# Patient Record
Sex: Male | Born: 1965 | Race: Black or African American | Hispanic: No | Marital: Single | Smoking: Never smoker
Health system: Southern US, Community
[De-identification: ages and names within clinical notes are randomized; demographics above are authoritative.]

## PROBLEM LIST (undated history)

## (undated) DIAGNOSIS — R809 Proteinuria, unspecified: Secondary | ICD-10-CM

## (undated) DIAGNOSIS — D509 Iron deficiency anemia, unspecified: Secondary | ICD-10-CM

## (undated) DIAGNOSIS — I5042 Chronic combined systolic (congestive) and diastolic (congestive) heart failure: Secondary | ICD-10-CM

## (undated) DIAGNOSIS — N189 Chronic kidney disease, unspecified: Secondary | ICD-10-CM

## (undated) DIAGNOSIS — Z9114 Patient's other noncompliance with medication regimen: Secondary | ICD-10-CM

## (undated) DIAGNOSIS — Z9289 Personal history of other medical treatment: Secondary | ICD-10-CM

## (undated) DIAGNOSIS — I10 Essential (primary) hypertension: Secondary | ICD-10-CM

## (undated) HISTORY — PX: OTHER SURGICAL HISTORY: SHX169

---

## 2004-11-07 ENCOUNTER — Emergency Department (HOSPITAL_COMMUNITY): Admission: EM | Admit: 2004-11-07 | Discharge: 2004-11-07 | Payer: Self-pay | Admitting: Emergency Medicine

## 2010-05-31 ENCOUNTER — Inpatient Hospital Stay (HOSPITAL_COMMUNITY)
Admission: EM | Admit: 2010-05-31 | Discharge: 2010-06-05 | DRG: 682 | Disposition: A | Discharge status: Home / Self Care | Payer: Medicaid Other | Attending: Internal Medicine | Admitting: Internal Medicine

## 2010-05-31 ENCOUNTER — Emergency Department (HOSPITAL_COMMUNITY): Payer: Medicaid Other

## 2010-05-31 DIAGNOSIS — Z91199 Patient's noncompliance with other medical treatment and regimen due to unspecified reason: Secondary | ICD-10-CM

## 2010-05-31 DIAGNOSIS — I472 Ventricular tachycardia, unspecified: Secondary | ICD-10-CM | POA: Diagnosis not present

## 2010-05-31 DIAGNOSIS — I5021 Acute systolic (congestive) heart failure: Secondary | ICD-10-CM | POA: Diagnosis present

## 2010-05-31 DIAGNOSIS — N032 Chronic nephritic syndrome with diffuse membranous glomerulonephritis: Secondary | ICD-10-CM | POA: Diagnosis present

## 2010-05-31 DIAGNOSIS — E876 Hypokalemia: Secondary | ICD-10-CM | POA: Diagnosis present

## 2010-05-31 DIAGNOSIS — I4729 Other ventricular tachycardia: Secondary | ICD-10-CM | POA: Diagnosis not present

## 2010-05-31 DIAGNOSIS — I129 Hypertensive chronic kidney disease with stage 1 through stage 4 chronic kidney disease, or unspecified chronic kidney disease: Principal | ICD-10-CM | POA: Diagnosis present

## 2010-05-31 DIAGNOSIS — N179 Acute kidney failure, unspecified: Secondary | ICD-10-CM | POA: Diagnosis present

## 2010-05-31 DIAGNOSIS — Z9119 Patient's noncompliance with other medical treatment and regimen: Secondary | ICD-10-CM

## 2010-05-31 DIAGNOSIS — N184 Chronic kidney disease, stage 4 (severe): Secondary | ICD-10-CM | POA: Diagnosis present

## 2010-05-31 DIAGNOSIS — I509 Heart failure, unspecified: Secondary | ICD-10-CM | POA: Diagnosis present

## 2010-05-31 LAB — COMPREHENSIVE METABOLIC PANEL
Alkaline Phosphatase: 72 U/L (ref 39–117)
BUN: 29 mg/dL — ABNORMAL HIGH (ref 6–23)
Calcium: 8.5 mg/dL (ref 8.4–10.5)
Creatinine, Ser: 2.83 mg/dL — ABNORMAL HIGH (ref 0.4–1.5)
Glucose, Bld: 79 mg/dL (ref 70–99)
Total Protein: 5.4 g/dL — ABNORMAL LOW (ref 6.0–8.3)

## 2010-05-31 LAB — URINE MICROSCOPIC-ADD ON

## 2010-05-31 LAB — CBC
HCT: 41.3 % (ref 39.0–52.0)
MCH: 26.4 pg (ref 26.0–34.0)
MCV: 84.6 fL (ref 78.0–100.0)
RBC: 4.88 MIL/uL (ref 4.22–5.81)
RDW: 15.5 % (ref 11.5–15.5)
WBC: 10.5 10*3/uL (ref 4.0–10.5)

## 2010-05-31 LAB — DIFFERENTIAL
Eosinophils Relative: 2 % (ref 0–5)
Lymphocytes Relative: 20 % (ref 12–46)
Lymphs Abs: 2.1 10*3/uL (ref 0.7–4.0)
Monocytes Relative: 5 % (ref 3–12)

## 2010-05-31 LAB — POCT CARDIAC MARKERS: Troponin i, poc: <0.05 ng/mL (ref 0.00–0.09)

## 2010-05-31 LAB — POCT I-STAT, CHEM 8
Calcium, Ion: 1.05 mmol/L — ABNORMAL LOW (ref 1.12–1.32)
Chloride: 111 mEq/L (ref 96–112)
Glucose, Bld: 106 mg/dL — ABNORMAL HIGH (ref 70–99)
HCT: 40 % (ref 39.0–52.0)
TCO2: 21 mmol/L (ref 0–100)

## 2010-05-31 LAB — URINALYSIS, ROUTINE W REFLEX MICROSCOPIC
Bilirubin Urine: NEGATIVE
Specific Gravity, Urine: 1.034 — ABNORMAL HIGH (ref 1.005–1.030)
Urine Glucose, Fasting: NEGATIVE mg/dL

## 2010-05-31 IMAGING — CR DG CHEST 1V PORT
1 series · 2 of 2 positions shown · non-contrast
Comparison: None.

CLINICAL DATA: Bilateral leg swelling

PORTABLE CHEST - 1 VIEW

[Series 1: AP · U · 2 of 2 slices shown]
[im 1/2]
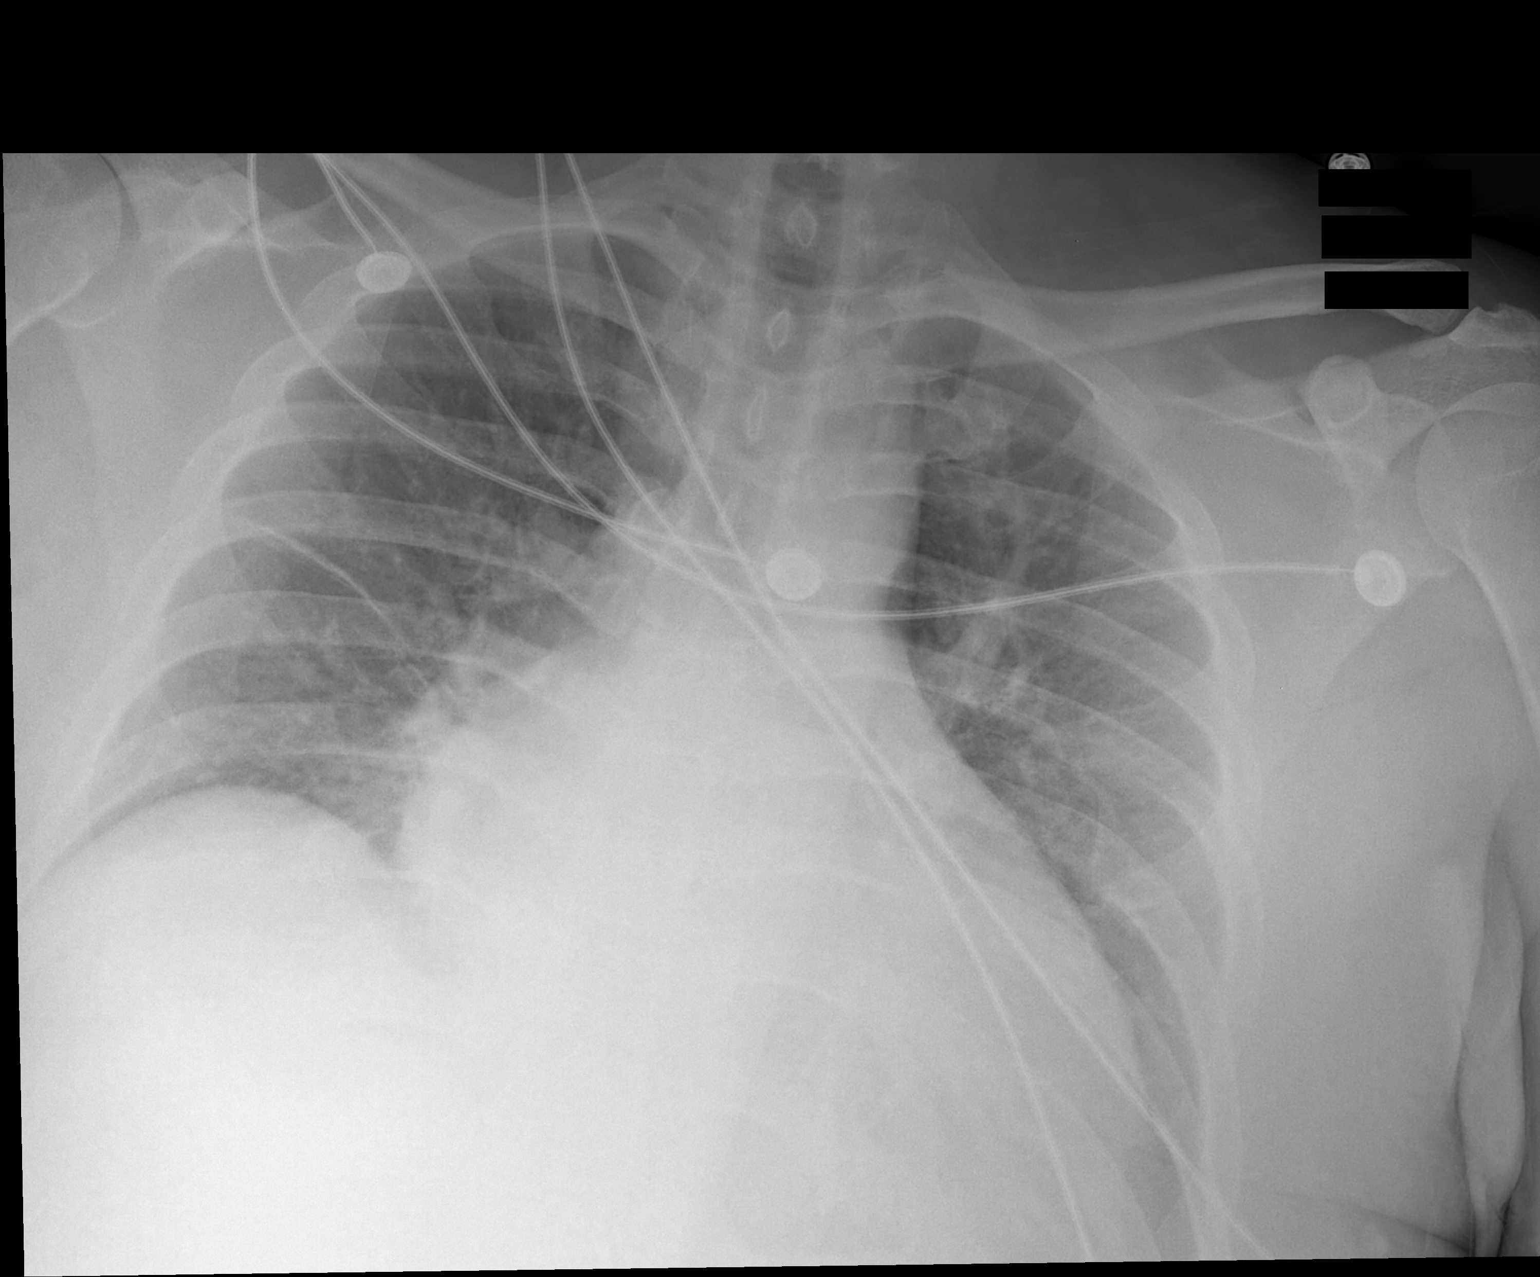
[im 2/2]
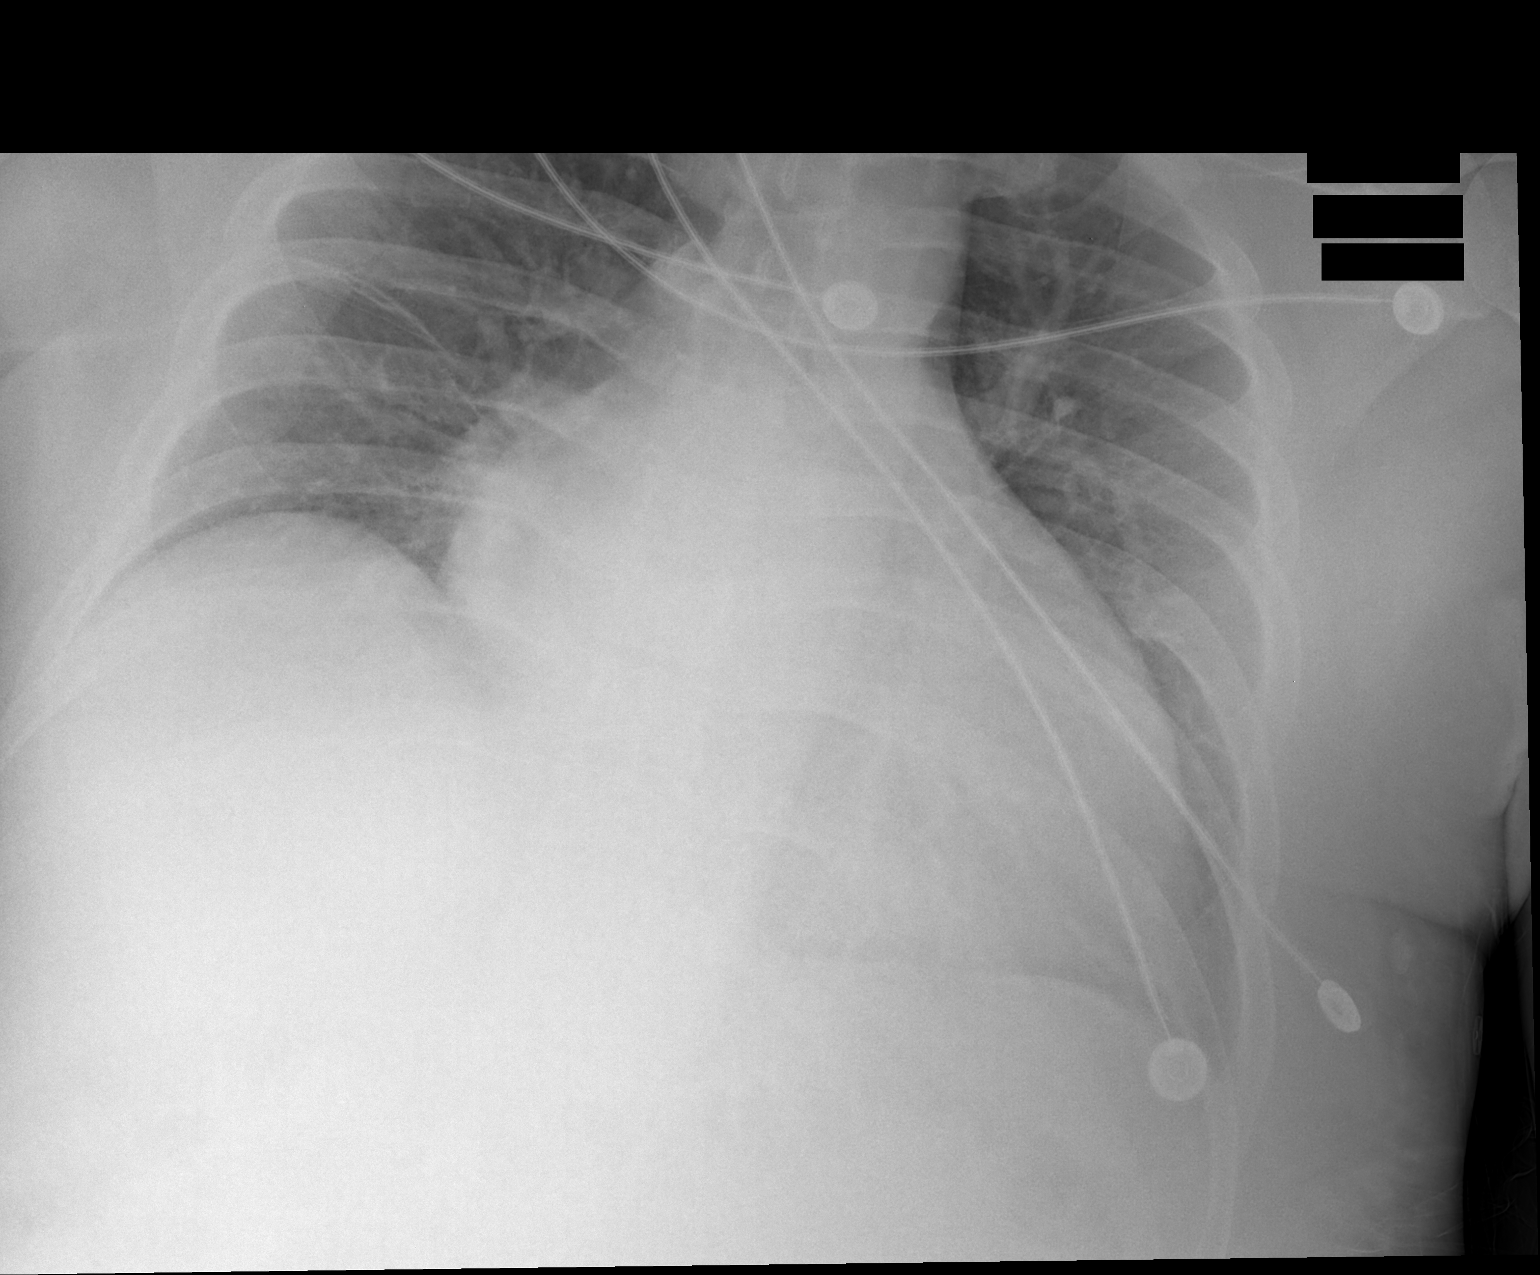

[2 of 2 positions shown; findings below may reference images not displayed]

FINDINGS: Mild cardiomegaly.  Central basilar airspace disease and
vascular congestion.  No pneumothorax.  Low volumes.
IMPRESSION: Cardiomegaly and mild CHF.

## 2010-05-31 IMAGING — US US RENAL
1 series · 14 of 25 positions shown · non-contrast
Comparison: None.

CLINICAL DATA: Hypertension

RENAL/URINARY TRACT ULTRASOUND COMPLETE

[Series 1: us renal · 0.33mm/px · 14 of 37 slices shown]
[im 1/37]
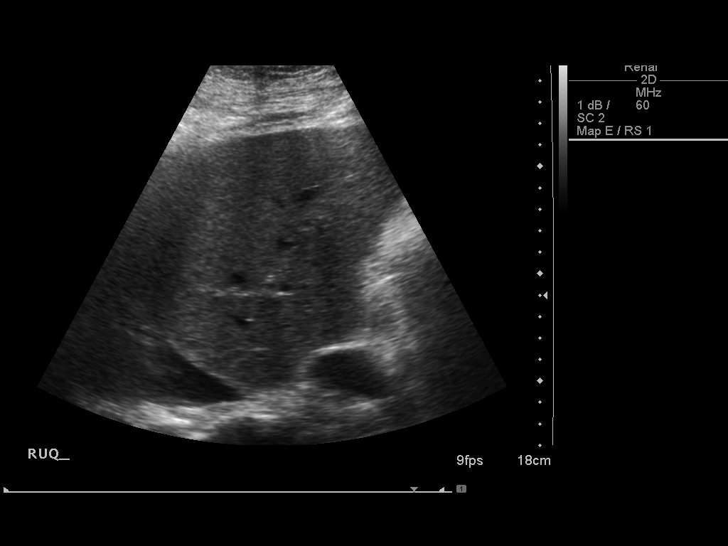
[im 4/37]
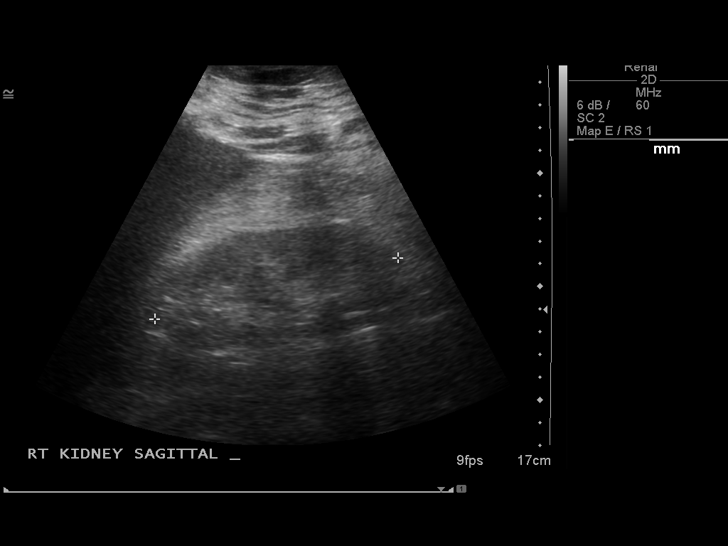
[im 7/37]
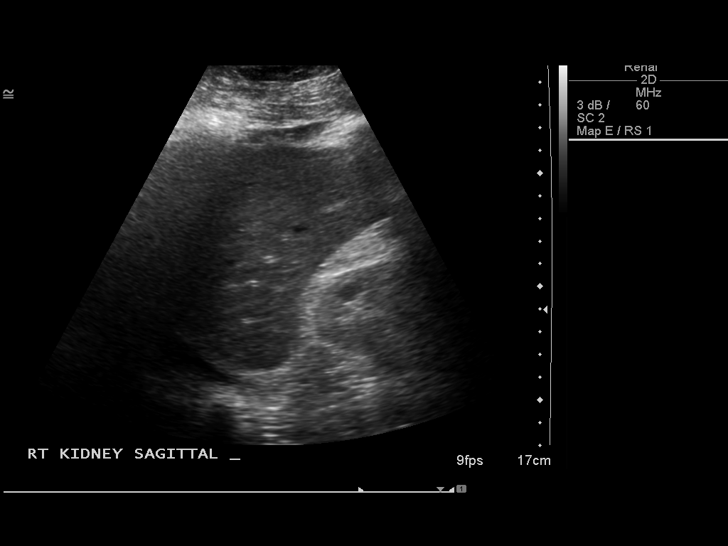
[im 10/37]
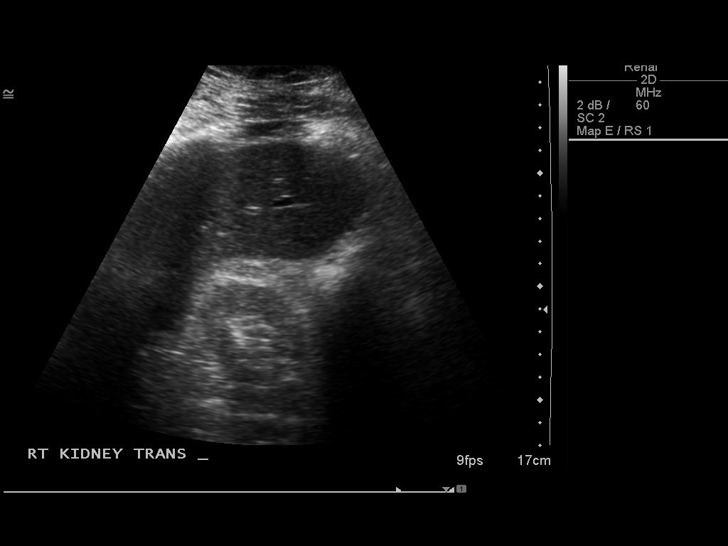
[im 13/37]
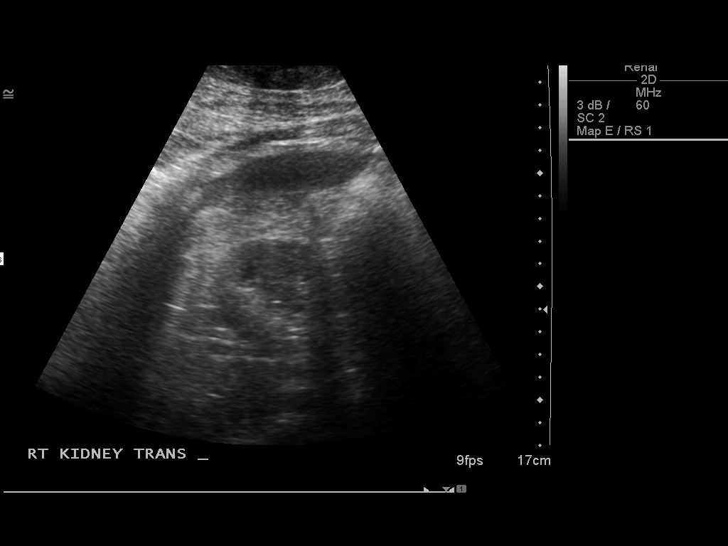
[im 14/37]
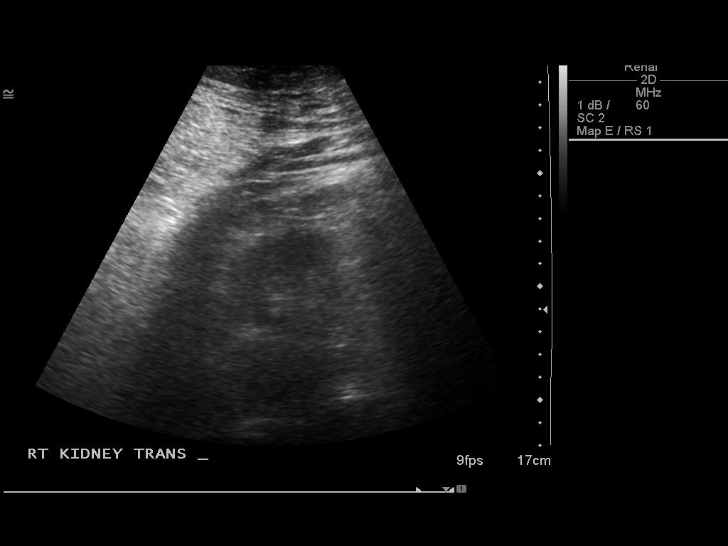
[im 17/37]
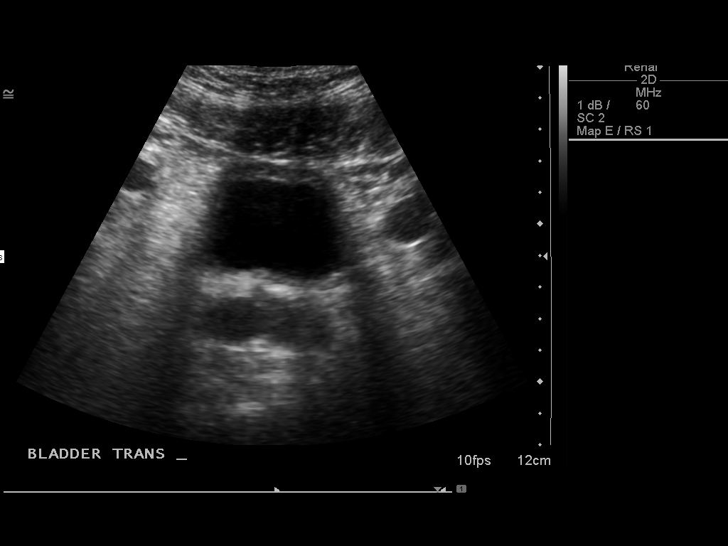
[im 20/37]
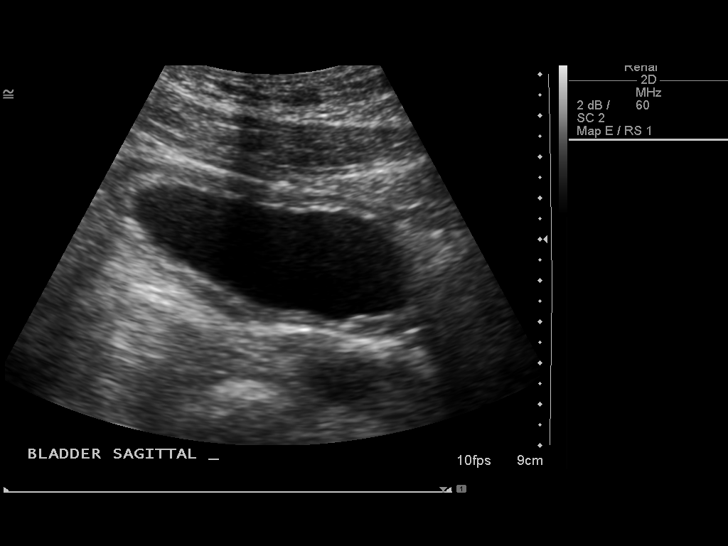
[im 23/37]
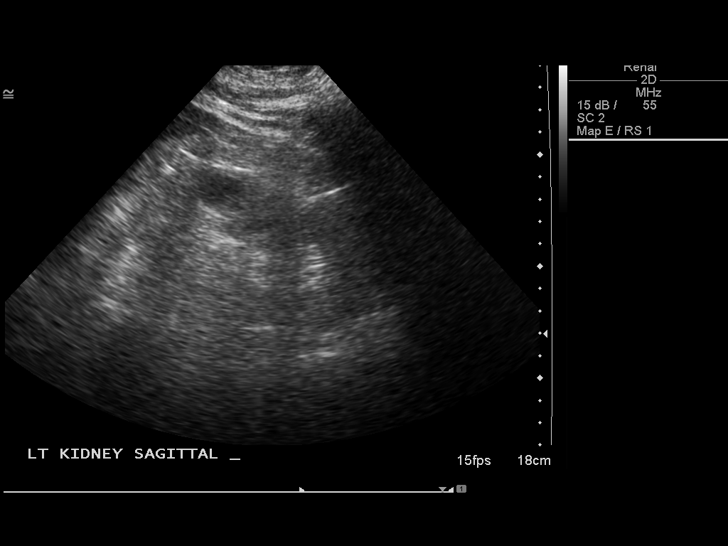
[im 25/37]
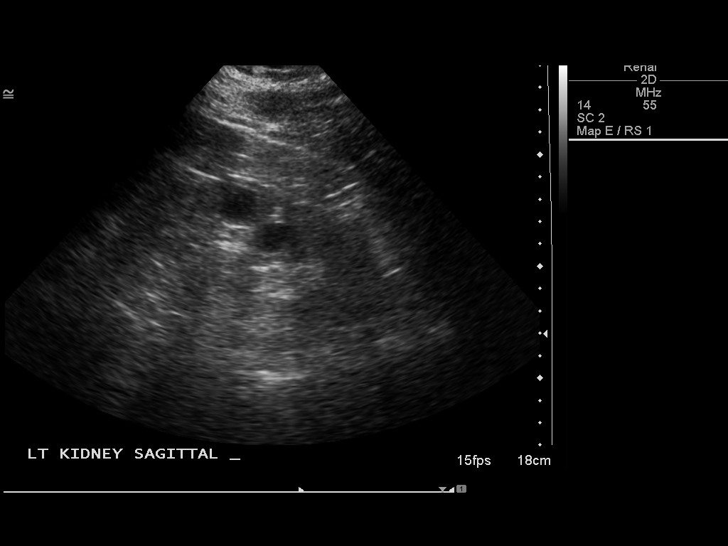
[im 28/37]
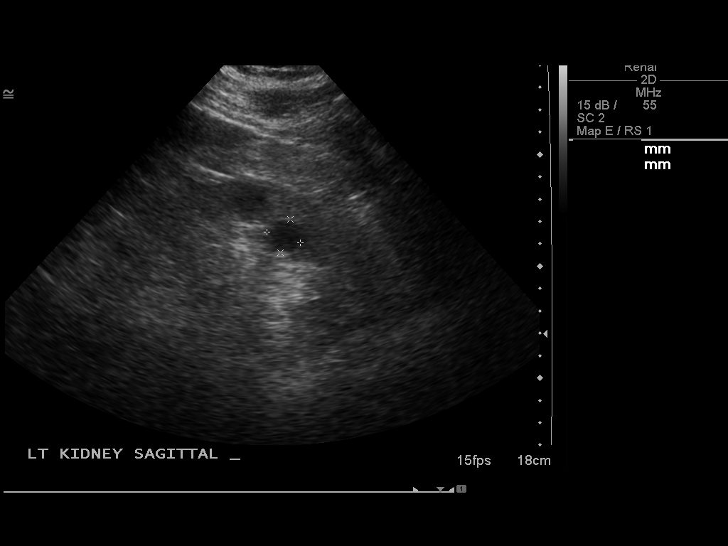
[im 31/37]
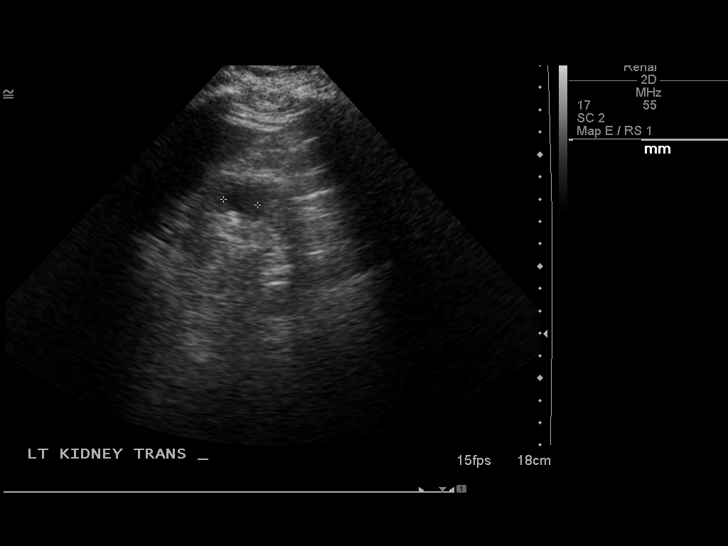
[im 34/37]
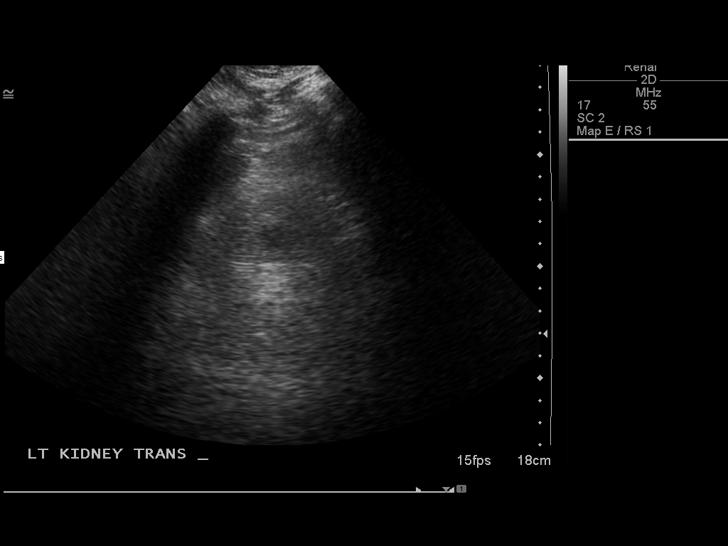
[im 37/37]
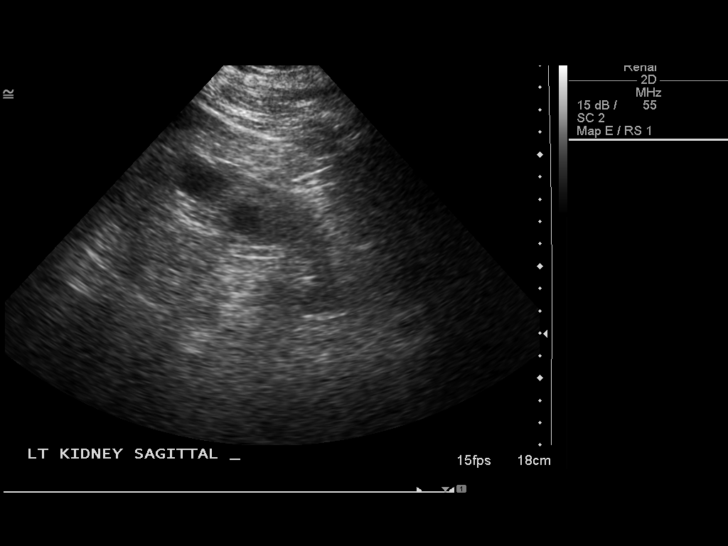

[14 of 25 positions shown; findings below may reference images not displayed]

FINDINGS: Right pleural effusion.

Right Kidney:  11.1 cm in length.  0.8 x 1.0 x 0.8 cm cyst in the
upper pole.  It is homogeneous with some internal echoes.  No solid
elements.  No hydronephrosis.

Left Kidney:  12.1 cm in length.  There are too small cyst in the
upper pole.  One is 1.8 x 1.1 0.6 cm.  The other is 1.6 x 1.6 x
cm.  They are homogeneous with internal echoes.  No hydronephrosis.

Bladder:  Within normal limits.
IMPRESSION: No hydronephrosis.

Benign-appearing cyst in the kidneys.

Right pleural effusion.

## 2010-06-01 LAB — MAGNESIUM: Magnesium: 2 mg/dL (ref 1.5–2.5)

## 2010-06-01 LAB — LIPID PANEL
LDL Cholesterol: 153 mg/dL — ABNORMAL HIGH (ref 0–99)
VLDL: 22 mg/dL (ref 0–40)

## 2010-06-01 LAB — BASIC METABOLIC PANEL
Chloride: 109 mEq/L (ref 96–112)
Creatinine, Ser: 2.83 mg/dL — ABNORMAL HIGH (ref 0.4–1.5)
GFR calc Af Amer: 30 mL/min — ABNORMAL LOW (ref >60)
Potassium: 3.1 mEq/L — ABNORMAL LOW (ref 3.5–5.1)
Sodium: 141 mEq/L (ref 135–145)

## 2010-06-01 LAB — TSH: TSH: 1.379 u[IU]/mL (ref 0.350–4.500)

## 2010-06-01 LAB — SODIUM, URINE, RANDOM: Sodium, Ur: 70 mEq/L

## 2010-06-01 LAB — CK: Total CK: 78 U/L (ref 7–232)

## 2010-06-01 LAB — MRSA PCR SCREENING: MRSA by PCR: NEGATIVE

## 2010-06-02 DIAGNOSIS — I5021 Acute systolic (congestive) heart failure: Secondary | ICD-10-CM

## 2010-06-02 LAB — BASIC METABOLIC PANEL
CO2: 26 mEq/L (ref 19–32)
Calcium: 8.8 mg/dL (ref 8.4–10.5)
GFR calc Af Amer: 29 mL/min — ABNORMAL LOW (ref >60)
GFR calc non Af Amer: 24 mL/min — ABNORMAL LOW (ref >60)
Glucose, Bld: 101 mg/dL — ABNORMAL HIGH (ref 70–99)
Potassium: 3.5 mEq/L (ref 3.5–5.1)
Sodium: 142 mEq/L (ref 135–145)

## 2010-06-02 LAB — CBC
Hemoglobin: 11.6 g/dL — ABNORMAL LOW (ref 13.0–17.0)
MCH: 26.1 pg (ref 26.0–34.0)
MCV: 83.8 fL (ref 78.0–100.0)
RBC: 4.44 MIL/uL (ref 4.22–5.81)
WBC: 7.9 10*3/uL (ref 4.0–10.5)

## 2010-06-02 LAB — CORTISOL: Cortisol, Plasma: 9.1 ug/dL

## 2010-06-02 LAB — C3 COMPLEMENT: C3 Complement: 127 mg/dL (ref 88–201)

## 2010-06-02 NOTE — H&P (Signed)
NAME:  Timothy Good, Timothy Good NO.:  [id number]  MEDICAL RECORD NO.:  [id number]           PATIENT TYPE:  E  LOCATION:  WLED                         FACILITY:  [facility]  PHYSICIAN:  Karlyn Agee, M.D. DATE OF BIRTH:  1965/06/29  DATE OF ADMISSION:  05/31/2010 DATE OF DISCHARGE:                             HISTORY & PHYSICAL   PRIMARY CARE PHYSICIAN:  [facility]/unassigned.  CHIEF COMPLAINT:  Swelling of the legs, progressively worse over the past 6 days.  HISTORY OF PRESENT ILLNESS:  This is a 45 year old African-American gentleman who has a history of hypertension, but has been off his medications for the past 2 years.  He has been having no complaints.  No headaches, chest pain, or shortness of breath and he is currently relocated to live in [place] with his daughter and she insisted that he come into the emergency room to be evaluated.  The patient came to be evaluated.  His blood pressure was found to be markedly elevated up to 205/155.  His renal function was found to be abnormal and the hospitalist service was called to assist with management.  The patient denies any regular use of aspirin or NSAIDs.  He said he did use BC powders 3 weeks ago and he did take to over-the-counter Advil about 2 days ago.  He denies any passage of bloody or black stool.  He denies any nausea, vomiting, or diarrhea.  The patient has received 80 mg of Lasix and so far put out 1200 mL of clear urine.  He denies any shortness of breath.  PAST MEDICAL HISTORY:  Hypertension.  MEDICATIONS:  None for the past 2 years.  Previously used to take atenolol and chlorthalidone.  ALLERGIES:  TO PENICILLIN.  SOCIAL HISTORY:  Denies tobacco or illicit drug use.  He drinks about 2 to 3 whiskeys per day.  Denies any tremors or withdrawal symptoms if he does not drink.  He is an unemployed Radiation protection practitioner.  FAMILY HISTORY:  Denies any medical problems in his  immediate family including hypertension, heart disease, or kidney disease.  REVIEW OF SYSTEMS:  Other than noted above unremarkable.  PHYSICAL EXAM:  GENERAL:  Pleasant, slightly obese middle-aged African- American gentleman, reclining in the stretcher.  Seems very anxious. VITALS:  His temperature is 97.6.  His pulse is currently 117, It was 123, respiratory rate is 16.  His blood pressure is now down to 197/148. He is saturating at 98% on 2 L. HEENT:  His pupils are round and equal.  Mucous membranes pink. Anicteric. NECK:  No cervical lymphadenopathy or thyromegaly.  No carotid bruit. CHEST:  Has coarse crackles over the right base and dullness to percussion on the right bases. CARDIOVASCULAR SYSTEM:  Regular rhythm.  No murmur heard. ABDOMEN:  Obese, soft, and nontender.  No masses felt.  Has normal abdominal bowel sounds. EXTREMITIES:  He had a 2+ soft pitting edema.  Feet are warm.  He has no arthritic deformities. SKIN:  Warm and dry.  There is no ulceration. CENTRAL NERVOUS SYSTEM:  He is somewhat jittery, but cranial nerves II through XII appear  to be grossly intact and he has no focal lateralizing signs.  LABORATORY DATA:  His white count is 10.5, hemoglobin 12.9, platelets 281 with a normal differential.  His I-STAT chemistry shows a sodium of 138, potassium 5.8, chloride 111, CO2 of 21.  His glucose is 106, BUN 46, creatinine 3.1.  His beta-type natriuretic peptide is elevated to 2090.  Urinalysis shows urine specific gravity of 1.034, large amount of blood, greater than 300 protein, nitrites, and leukocyte esterase negative.  Urine microscopy shows no white cells, no red cells, granular casts, but otherwise unremarkable.  His EKG shows sinus tachycardia with nonspecific ST-wave changes.  Chest x-ray has not been done.  ASSESSMENT: 1. Hypertensive emergency. 2. 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 3. 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 4. Proteinuria.  PLAN: 1. We will continue the patient on a nitroglycerin drip.  We will add     regular hydralazine as well as p.r.n. hydralazine to assist with     blood pressure management. 2. We will continue twice daily Lasix to assist with diuresis, but we     will monitor his renal function. 3. We will get a 2-D echo for CHF and we will get a renal ultrasound     to evaluate just how chronic his kidney disease is.  We will check     a serum potassium, urine electrolytes, and get 24-hour urine for     proteins.  His urinalysis is positive for heme; however, he is     showing no blood cells on microscopy and no CK-MB in his urine.  We     will get a total CPK to just double-check and we will consult     nephrologist for assistance with management during his hospital     course.  Other plans as per orders.  Critical care time managing this patient is 60 minutes.     Karlyn Agee, M.D.     LC/MEDQ  D:  05/31/2010  T:  05/31/2010  Job:  [id number]  Electronically Signed by Karlyn Agee M.D. on 06/02/2010 03:33:20 AM

## 2010-06-03 ENCOUNTER — Inpatient Hospital Stay (HOSPITAL_COMMUNITY): Payer: Medicaid Other

## 2010-06-03 DIAGNOSIS — I10 Essential (primary) hypertension: Secondary | ICD-10-CM

## 2010-06-03 DIAGNOSIS — I428 Other cardiomyopathies: Secondary | ICD-10-CM

## 2010-06-03 LAB — BASIC METABOLIC PANEL
BUN: 22 mg/dL (ref 6–23)
CO2: 28 mEq/L (ref 19–32)
Calcium: 8.8 mg/dL (ref 8.4–10.5)
Chloride: 105 mEq/L (ref 96–112)
Creatinine, Ser: 4.54 mg/dL — ABNORMAL HIGH (ref 0.4–1.5)
GFR calc Af Amer: 17 mL/min — ABNORMAL LOW (ref >60)

## 2010-06-03 LAB — BRAIN NATRIURETIC PEPTIDE: Pro B Natriuretic peptide (BNP): 952 pg/mL — ABNORMAL HIGH (ref 0.0–100.0)

## 2010-06-03 MED ORDER — TECHNETIUM TC 99M TETROFOSMIN IV KIT
30.0000 | PACK | Freq: Once | INTRAVENOUS | Status: AC | PRN
  Administered 2010-06-03: 30 via INTRAVENOUS

## 2010-06-03 MED ORDER — TECHNETIUM TC 99M TETROFOSMIN IV KIT
10.0000 | PACK | Freq: Once | INTRAVENOUS | Status: AC | PRN
  Administered 2010-06-03: 10 via INTRAVENOUS

## 2010-06-04 ENCOUNTER — Inpatient Hospital Stay (HOSPITAL_COMMUNITY): Payer: Self-pay

## 2010-06-04 DIAGNOSIS — I5020 Unspecified systolic (congestive) heart failure: Secondary | ICD-10-CM

## 2010-06-04 LAB — BASIC METABOLIC PANEL
BUN: 24 mg/dL — ABNORMAL HIGH (ref 6–23)
Calcium: 8.8 mg/dL (ref 8.4–10.5)
Chloride: 101 mEq/L (ref 96–112)
Creatinine, Ser: 2.57 mg/dL — ABNORMAL HIGH (ref 0.4–1.5)
GFR calc Af Amer: 33 mL/min — ABNORMAL LOW (ref >60)
GFR calc non Af Amer: 28 mL/min — ABNORMAL LOW (ref >60)

## 2010-06-04 LAB — URINE CULTURE
Colony Count: 25000
Culture  Setup Time: 201203021439

## 2010-06-04 LAB — PHOSPHORUS: Phosphorus: 4.2 mg/dL (ref 2.3–4.6)

## 2010-06-04 LAB — IRON AND TIBC: Saturation Ratios: 16 % — ABNORMAL LOW (ref 20–55)

## 2010-06-04 LAB — MAGNESIUM: Magnesium: 2 mg/dL (ref 1.5–2.5)

## 2010-06-04 LAB — FOLATE: Folate: 11.2 ng/mL

## 2010-06-04 LAB — FERRITIN: Ferritin: 93 ng/mL (ref 22–322)

## 2010-06-05 LAB — BASIC METABOLIC PANEL
BUN: 25 mg/dL — ABNORMAL HIGH (ref 6–23)
Chloride: 102 mEq/L (ref 96–112)
GFR calc Af Amer: 32 mL/min — ABNORMAL LOW (ref >60)
GFR calc non Af Amer: 26 mL/min — ABNORMAL LOW (ref >60)
Potassium: 3.6 mEq/L (ref 3.5–5.1)
Sodium: 137 mEq/L (ref 135–145)

## 2010-06-05 LAB — PROTEIN ELECTROPH W RFLX QUANT IMMUNOGLOBULINS
Albumin ELP: 50.2 % — ABNORMAL LOW (ref 55.8–66.1)
Beta Globulin: 5.9 % (ref 4.7–7.2)
M-Spike, %: NOT DETECTED g/dL
Total Protein ELP: 5.5 g/dL — ABNORMAL LOW (ref 6.0–8.3)

## 2010-06-05 LAB — PTH, INTACT AND CALCIUM
Calcium, Total (PTH): 8.6 mg/dL (ref 8.4–10.5)
PTH: 249.7 pg/mL — ABNORMAL HIGH (ref 14.0–72.0)

## 2010-06-05 LAB — PROTEIN, URINE, 24 HOUR
Collection Interval-UPROT: 24 hours
Urine Total Volume-UPROT: 4550 mL

## 2010-06-05 NOTE — Consult Note (Signed)
NAME:  Timothy Good, Timothy Good              ACCOUNT NO.:  [id number]  MEDICAL RECORD NO.:  [id number]           PATIENT TYPE:  I  LOCATION:  [id number]                         [facility]ACILITY:  [facility]  PHYSICIAN:  Tywan Siever L. Undra Trembath, M.D.DATE OF BIRTH:  05/13/1965  DATE OF CONSULTATION: DATE OF DISCHARGE:                                CONSULTATION   REFERRING PHYSICIAN:  [facility].  REASON FOR CONSULTATION:  Chronic kidney disease stage 4, severe hypertension.  HISTORY OF PRESENT ILLNESS:  This is a 45 year old gentleman with over 20 years of hypertension that is known.  He did not take any medications for a couple of years.  No family history of renal disease.  His daughter brought him to emergency room at this time to have him evaluated.  He notes edema has been the only symptom he has had.  He gets short of breath walking a flight of stairs or walking uphill.  He does try to exercise.  He has been losing a little weight.  He has nocturia x2 to 3.  Has family history of hypertension in his mother, he has a son who does not have high blood pressure, and dad he does not know if he has high blood pressure.  He has no history of stones, UTIs. No family history of inherited eye, hearing, or musculoskeletal defects. He has had no history of rash, arthritis.  He occasionally has some arthralgias once in a while and takes no more than 1 week of Tylenol or Advil.  He denies street drug use.  He denies smoking.  He occasionally uses alcohol once every 2-3 weeks, 3-4 drinks.  He is currently on no medications.  PAST MEDICAL HISTORY:  As above.  No operations.  ALLERGIES:  PENICILLIN.  REVIEW OF SYSTEMS:  HEENT:  No headaches, visual difficulties, sores, or rash.  PULMONARY:  He denies cough, sputum production.  He does have occasional hay fever.  GI:  No nausea, vomiting, diarrhea.  No indigestion or heartburn.  No history of hepatitis, yellow jaundice. SKIN:  Unremarkable.  MUSCULOSKELETAL:   No specific complaints. NEUROLOGIC:  Negative.  CARDIOVASCULAR:  As listed above.  No PND, no orthopnea.  FAMILY HISTORY:  Listed above.  He has a daughter and a son, who are healthy.  SOCIAL HISTORY:  He has worked as a Radiation protection practitioner, he is currently unemployed. Nonsmoker, nondrinker.  OBJECTIVE:  GENERAL:  No acute distress. HEENT:  Dense scarring in both retinas.  Pharynx, unremarkable. NECK:  Unremarkable. LUNGS:  Reveal no rales, rhonchi, or wheeze.  Slightly decreased resonance to percussion, normal expansion, normal breath sounds. CARDIOVASCULAR:  S4, grade 1/6 to 2/6 holosystolic murmur heard best at the apex.  Left ventricular lift, 2+ edema.  Pulses 2+ over 4+, no bruits. ABDOMEN:  Obese.  Positive bowel sounds.  Liver is down about 4 cm. SKIN:  No rash. MUSCULOSKELETAL:  Unremarkable. NEUROLOGICAL EXAM:  Cranial nerves II-XII grossly intact.  Motor 5/5, symmetric.  Deep tendon reflexes 2+ over 4+ in upper extremities, 1+ in patellar and Achilles.  Toes downgoing. VITAL SIGNS:  Blood pressure 134/82, temperature 98.1, 97.6% sat on room air.  Chest x-ray, cardiomegaly with vascular congestion.  Ultrasound of kidneys, right 11.1 with increased echogenicity, left 12.1 with increased echogenicity and right pleural effusion on ultrasound.  Urine output 100-200 cc an hour.  Hemoglobin is 12.9, white count of 11.5, platelets 21,000.  Sodium 149, potassium 3.1, chloride 108, bicarbonate 24, creatinine 2.83, BUN is 27, glucose 102, calcium 8.3, phosphorus 4.4, LDL was 153.  BNP is 2090.  Urinalysis, 300 mg of protein, 0-2 red cells, 0-2 white cells, urine sodium 70, urine creatinine 113.  HIV negative.  ASSESSMENT: 1. Chronic kidney disease 4 secondary to hypertension plus/minus a     chronic myelopathy.  Acute hypertension can give you proteinuria,     but the mild proteinuria is more typical for chronic myelopathy     such as focal segmental glomerulosclerosis.  Doubt there is  much     reversibility with the echogenicity that he has and the fact this     has been going on this long.  His creatinine may come down with     time, but usually that is delayed.  He needs some blood pressure     control.  This has to do with renal perfusion and inability to     autoregulate.  His volume is in excess now.  We will do an     evaluation to rule out cardiovascular disease, dysproteinemia.  He     is not a candidate for biopsy now, but may have to consider that in     the future. 2. Hypertension.  Would not bring down to goal at this point because     of higher risk of injury to the kidney further.  Decreased     potassium is secondary to hypertension.  Needs diuresis.  We will     add an ACE in 7-14 days once he settles down his proteinuria and I     will assist in his blood pressure control.  He needs diuresis at     this time. 3. Hyperlipidemia.  __________ and also because of proteinuria.  PLAN: 1. Serum urine protein electrophoresis. 2. __________ T4. 3. FNA. 4. Hepatitis B serology. 5. Hepatitis C testing. 6. Add Norvasc. 7. Lasix.          ______________________________ Joyice Faster Ioannis Schuh, M.D.  JLD/MEDQ  D:  06/01/2010  T:  06/01/2010  Job:  [id number]  Electronically Signed by Mauricia Area M.D. on 06/05/2010 04:41:11 PM

## 2010-06-15 LAB — CATECHOLAMINES, FRACTIONATED, URINE, 24 HOUR

## 2010-06-15 LAB — VMA AND HVA, 24 HR UR
24 Hour urine volume (VMAHVA): 4550 mL/24 h
Creatinine, Urine mg/day-VMAUR: 1.81 g/(24.h) (ref 0.63–2.50)
Homovanillic Acid, 24H Ur: 3.2 mg/24 h (ref 1.6–7.5)

## 2010-07-05 NOTE — Consult Note (Signed)
NAME:  Timothy Good, Timothy Good              ACCOUNT NO.:  [id number]  MEDICAL RECORD NO.:  [id number]           PATIENT TYPE:  I  LOCATION:  [id number]                         [facility]ACILITY:  [facility]  PHYSICIAN:  Marijo Conception. Fahmida Jurich, MD, FACCDATE OF BIRTH:  Nov 21, 1965  DATE OF CONSULTATION: DATE OF DISCHARGE:                                CONSULTATION   DATE OF CONSULTATION:  June 02, 2010.  PRIMARY CARE PHYSICIAN:  None.  PRIMARY CARDIOLOGIST:  None.  CHIEF COMPLAINT:  Acute systolic CHF.  HISTORY OF PRESENT ILLNESS:  Timothy Good is a 45 year old male with no previous history of coronary artery disease or cardiac issues.  He was admitted on February 29 with congestive heart failure.  An echocardiogram showed his EF to be 20 to 25%, so cardiology was asked to evaluate him.  When Timothy Good thinks about it, he realizes that he was having some dyspnea on exertion as long as 3 weeks ago.  However, he feels that his symptoms really began about a week ago, as that is when he started noticing lower extremity edema and increasing dyspnea on exertion.  By the night before he came in the hospital, he was getting dyspneic, walking room to room.  He also describes 1 episode of PND.  He does not believe he had orthopnea.  He does not exercise and is not active, so the progression of his dyspnea is difficult to ascertain.  Since being in the hospital and being diuresed, he feels much better.  He has never had chest pain.  PAST MEDICAL HISTORY: 1. Hypertension:  The patient is not sure how long he has been     hypertensive but thinks he was first told about it 4 or 5 years     ago.  He has never taken medications consistently for this and has     received very little care. 2. The patient denies any history of diabetes, hyperlipidemia or     tobacco use.  PAST SURGICAL HISTORY:  None.  ALLERGIES:  PENICILLIN.  MEDICATIONS PRIOR TO ADMISSION:  None in 2 years or more.  CURRENT MEDICATIONS: 1. Norvasc 10  mg a day. 2. Aspirin 81 mg a day. 3. Coreg 6.25 mg b.i.d., started today. 4. Lasix 80 mg p.o. b.i.d. 5. Hydralazine 50 mg q.6h. 6. MiraLax daily. 7. Potassium 20 mEq daily.  SOCIAL HISTORY:  He is an unemployed Therapist, nutritional.  He denies any history of alcohol or drug abuse.  He lives in [place] alone.  He will split-a-fifth of liquor with some friends about 2 days a week.  FAMILY HISTORY:  He has no information on his father.  His mother died at 75 and he does not remember any coronary artery disease but states he was told that she died of a weak heart from years of smoking.  No siblings have cardiac issues.  REVIEW OF SYSTEMS:  He has had no recent fevers or chills and no cough or cold symptoms.  He has rare musculoskeletal complaints and no history of GI symptoms.  Full 14-point review of systems is otherwise negative, except as stated in the HPI.  PHYSICAL  EXAMINATION:  VITAL SIGNS:  Temperature is 97.7, blood pressure 149/110, heart rate 105, respiratory rate 25, O2 saturation 98% on room air.  I's & O's were negative by 3 liters overnight. GENERAL:  He is a well-developed, well-nourished African-American male in no acute distress. HEENT:  Normal with the exception of poor dentition. NECK:  He has JVD approximately 12 cm meters but no thyromegaly, no lymphadenopathy, no tracheal deviation noted. CHEST:  He has crackles in the bases, right greater than left. CV:  His heart is regular in rate and rhythm with an S1 and S2 and an S3 is noted.  He may also have a soft systolic murmur at the left lower sternal border. SKIN:  No rashes or lesions are noted. ABDOMEN:  Soft and nontender with active bowel sounds and a positive hepatojugular reflux. EXTREMITIES:  There is trace edema but no cyanosis or clubbing is noted. MUSCULOSKELETAL:  There is no joint deformity or effusions and no spine or CVA tenderness. NEUROLOGIC:  He is alert and oriented with no focal deficits  noted.  LABORATORY DATA:  Hemoglobin 11.6, hematocrit 37.2, WBC 7.9, platelets 257,000.  Sodium 142, potassium 3.5, chloride 107, CO2 of 26, BUN 25, creatinine 2.86 (3.1 on admission), glucose 101.  Point of care markers negative x 1.  BNP 2090 on February 29.  Total cholesterol 214, triglycerides 112, HDL 39, LDL 153.  Chest x-ray, mild CHF.  Renal ultrasound, no significant problem.  Echocardiogram shows an EF of 20 to 25% with Veron Senner motion abnormalities and grade 3 diastolic dysfunction, PA of 37.  EKG, sinus tach, rate 114 with lateral T-wave changes but no old is available for comparison.  IMPRESSION:  Timothy Good was seen today by Dr. Verl Blalock, the patient evaluated and the data reviewed.  He has new onset of acute systolic congestive heart failure with significant Teven Mittman motion abnormalities. These suggest ischemic heart disease but it may be all due to hypertension.  The risks and benefits of the cardiac catheterization were discussed with the patient but he is reluctant to have the procedure.  With his creatinine of 2.86, we will arrange a Myoview for risk stratification.  He should be continued on medical therapy to decrease his blood pressure further for afterload reduction. With his body habitus, his Coreg can be increased to 9.375 mg b.i.d. tomorrow and titrate it rapidly after that as needed.     Rosaria Ferries, PA-C   ______________________________ Marijo Conception Verl Blalock, MD, [facility]    RB/MEDQ  D:  06/02/2010  T:  06/02/2010  Job:  [id number]  Electronically Signed by Rosaria Ferries PA-C on 07/05/2010 12:54:44 PM Electronically Signed by Jenell Milliner MD [facility] on 07/05/2010 01:02:01 PM

## 2010-07-08 NOTE — Discharge Summary (Signed)
NAME:  Timothy Good, Timothy Good              ACCOUNT NO.:  [id number]  MEDICAL RECORD NO.:  [id number]           PATIENT TYPE:  I  LOCATION:  [id number]                         FACILITY:  [facility]  PHYSICIAN:  Reyne Dumas, MD       DATE OF BIRTH:  12/29/1965  DATE OF ADMISSION:  05/31/2010 DATE OF DISCHARGE:  06/05/2010                        DISCHARGE SUMMARY - REFERRING   PRIMARY CARE PHYSICIAN:  Dr. Abran Cantor, in [place].  DISCHARGE DIAGNOSES: 1. Acute on chronic kidney disease stage IV. 2. Uncontrolled hypertension. 3. Systolic heart failure. 4. Noncompliance with medications.  PROCEDURES:  A 2D echo done shows an EF of 20%-25% with akinesis of the inferior myocardium with severe hypokinesis of the anteroseptal myocardial and grade 3 diastolic dysfunction.  Lexiscan Myoview is negative for inducible ischemia with an EF of 14%. Chest x-ray shows cardiomegaly and mild CHF.  Renal ultrasound shows no hydronephrosis, benign appearing cyst in the kidneys with the right pleural effusion.  Nephrotic syndrome.  24 urine protein collection shows proteinuria of about 3.9 g per day.  Hepatitis B, Hepatitis C antibody and TSH, C3-C4 complement and ANA all within normal limits.  CONSULTATIONS:  Dr. Verl Blalock, [facility] for systolic heart failure. Dr. Mauricia Area, Nephrology for renal failure.  SUBJECTIVE:  This is a 45 year old African American male with a history of hypertension who presented to the ED on the 29th with shortness of breath.  The patient also presented with progressive swelling of his legs.  He was found to have an elevated blood pressure of 205/155.  The patient denied use of any aspirin or NSAIDs.  He also complained of significant dyspnea on exertion.  He was admitted to the ER for further evaluation in the ED.  Blood pressure was found to be 197/148.  HOSPITAL GOALS: 1. Hypertensive emergency.  The patient was initially started on     nitroglycerin drip.   Subsequently transitioned to hydralazine.     During the course of this hospitalization, the patient's     antihypertensive regimen was adjusted based on his echocardiogram     and his renal function.  He was found not to be a candidate for ACE     inhibitor because of his elevated creatinine.  He was subsequently     transitioned to Coreg as well as Norvasc and p.o. hydralazine.  The     patient will need a repeat BMET in about 1-2 weeks.  If his     creatinine had stabilized, then he would be a candidate for an ACE     inhibitor in the future. 2. Stage IV chronic kidney disease.  During this hospital stay, the     patient's creatinine peaked at about 4.54.  Nephrology was     consulted and Dr. Jeneen Rinks Deterding saw the patient.  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  It     was felt that there was not much reversibility of his kidney     function and his creatinine would probably stabilized around  2.5.     He had an extensive workup done including an SPEP, UPEP, hepatitis     B, hepatitis C serology.  It is also recommended to start him on     high dose Lasix, which he tolerated well without significant     worsening of his renal function. 3. Shortness of breath and systolic heart failure because of his low     EF.  Cardiology was consulted.  They managed him medically and also     scheduled him for a Lexiscan Myoview that did not show any     reversible ischemia.  His EF was found to be 14%.  DISPOSITION:  At this time, the patient has been evaluated by Cardiology and is found to be stable for discharge from their perspective.  They have also reviewed his medications and their recommended that the patient follow up with Dr. Jenell Milliner in about 1-2 weeks.  DISCHARGE MEDICATIONS: 1. Norvasc 10 mg p.o. daily. 2. Aspirin 81 mg p.o. daily. 3. Coreg 25 mg p.o. twice daily. 4. Lasix  80 p.o. twice daily. 5. Hydralazine 50 p.o. q.8 h. 6. Potassium 20 mEq p.o. daily.     Reyne Dumas, MD     NA/MEDQ  D:  06/05/2010  T:  06/05/2010  Job:  [id number]  cc:   Zenia Resides MD Neoma Laming  Electronically Signed by Reyne Dumas MD on 07/08/2010 08:34:01 PM

## 2011-06-23 ENCOUNTER — Emergency Department (HOSPITAL_COMMUNITY): Payer: Medicaid Other

## 2011-06-23 ENCOUNTER — Encounter (HOSPITAL_COMMUNITY): Payer: Self-pay

## 2011-06-23 ENCOUNTER — Emergency Department (HOSPITAL_COMMUNITY)
Admission: EM | Admit: 2011-06-23 | Discharge: 2011-06-23 | Disposition: A | Discharge status: Home / Self Care | Payer: Medicaid Other | Attending: Emergency Medicine | Admitting: Emergency Medicine

## 2011-06-23 ENCOUNTER — Other Ambulatory Visit: Payer: Self-pay

## 2011-06-23 DIAGNOSIS — N289 Disorder of kidney and ureter, unspecified: Secondary | ICD-10-CM

## 2011-06-23 DIAGNOSIS — E876 Hypokalemia: Secondary | ICD-10-CM

## 2011-06-23 DIAGNOSIS — I509 Heart failure, unspecified: Secondary | ICD-10-CM | POA: Insufficient documentation

## 2011-06-23 DIAGNOSIS — I10 Essential (primary) hypertension: Secondary | ICD-10-CM

## 2011-06-23 HISTORY — DX: Essential (primary) hypertension: I10

## 2011-06-23 LAB — BASIC METABOLIC PANEL
CO2: 26 mEq/L (ref 19–32)
Chloride: 102 mEq/L (ref 96–112)
Sodium: 139 mEq/L (ref 135–145)

## 2011-06-23 LAB — CBC
HCT: 41.3 % (ref 39.0–52.0)
Platelets: 318 10*3/uL (ref 150–400)
RBC: 4.91 MIL/uL (ref 4.22–5.81)
RDW: 14.2 % (ref 11.5–15.5)
WBC: 8.3 10*3/uL (ref 4.0–10.5)

## 2011-06-23 LAB — DIFFERENTIAL
Basophils Absolute: 0 10*3/uL (ref 0.0–0.1)
Lymphocytes Relative: 33 % (ref 12–46)
Lymphs Abs: 2.7 10*3/uL (ref 0.7–4.0)
Neutro Abs: 4.9 10*3/uL (ref 1.7–7.7)

## 2011-06-23 MED ORDER — CARVEDILOL 25 MG PO TABS: 25.0000 mg | ORAL_TABLET | Freq: Two times a day (BID) | ORAL | Status: DC

## 2011-06-23 MED ORDER — POTASSIUM CHLORIDE CRYS ER 20 MEQ PO TBCR
40.0000 meq | EXTENDED_RELEASE_TABLET | Freq: Once | ORAL | Status: AC
  Administered 2011-06-23: 40 meq via ORAL
  Filled 2011-06-23: qty 2

## 2011-06-23 NOTE — ED Notes (Signed)
Pt states he has been out of Lasix and Carvedilol for 3 days. Pt states he became lightheaded roughly 1 hour ago.

## 2011-06-23 NOTE — ED Provider Notes (Signed)
History   Scribed for Sharyon Cable, MD, the patient was seen in [place]. The chart was scribed by Clarisa Fling. The patients care was started at 12:39 PM.   CSN: [id number]  Arrival date & time 06/23/11  1210   First MD Initiated Contact with Patient 06/23/11 1238      Chief Complaint  Patient presents with  . Hypertension    HPI Timothy Good is a 46 y.o. male who presents to the Emergency Department complaining of hypertension. Pt reports moved and ran out of BP medications 2-3 days ago. Notes feeling light headed. Denies chest pain, headache, or visual problems. Pt is requesting Rx for meds. There are no other associated symptoms and no other alleviating or aggravating factors.  Denies cp/sob/weakness/headache No syncope reported  Past Medical History  Diagnosis Date  . Hypertension   . CHF (congestive heart failure)   . Renal disorder     stage IV kidney disease    History reviewed. No pertinent past surgical history.  No family history on file.  History  Substance Use Topics  . Smoking status: Never Smoker   . Smokeless tobacco: Not on file  . Alcohol Use: No     occ      Review of Systems  Eyes: Negative for visual disturbance.  Cardiovascular: Negative for chest pain.  Neurological: Negative for headaches.  All other systems reviewed and are negative.    Allergies  Review of patient's allergies indicates no known allergies.  Home Medications  No current outpatient prescriptions on file.  BP 188/125  Pulse 107  Temp(Src) 97.9 F (36.6 C) (Oral)  Resp 18  Ht 6\' 2"  (1.88 m)  Wt 210 lb (95.255 kg)  BMI 26.96 kg/m2  SpO2 100%  Physical Exam CONSTITUTIONAL: Well developed/well nourished HEAD AND FACE: Normocephalic/atraumatic EYES: EOMI/PERRL ENMT: Mucous membranes moist NECK: supple no meningeal signs SPINE:entire spine nontender CV: S1/S2 noted, no murmurs/rubs/gallops noted LUNGS: Lungs are clear to auscultation bilaterally, no  apparent distress ABDOMEN: soft, nontender, no rebound or guarding GU:no cva tenderness NEURO: Pt is awake/alert, moves all extremitiesx4, no arm/leg drift, face is symmetric EXTREMITIES: pulses normal, full ROM SKIN: warm, color normal PSYCH: no abnormalities of mood noted   ED Course  Procedures   Labs Reviewed  CBC  DIFFERENTIAL  BASIC METABOLIC PANEL    Renal function improved but mild hypoK noted, will defer lasix Rx but did give carvedilol Pt already has hydralazine  The patient appears reasonably screened and/or stabilized for discharge and I doubt any other medical condition or other [facility] requiring further screening, evaluation, or treatment in the ED at this time prior to discharge.    MDM  Nursing notes reviewed and considered in documentation All labs/vitals reviewed and considered Previous records reviewed and considered    Date: 06/23/2011  Rate: 106  Rhythm: sinus tachycardia  QRS Axis: normal  Intervals: normal  ST/T Wave abnormalities: nonspecific ST changes  Conduction Disutrbances:none  Narrative Interpretation:   Old EKG Reviewed: unchanged     I personally performed the services described in this documentation, which was scribed in my presence. The recorded information has been reviewed and considered.          Sharyon Cable, MD 06/23/11 1550

## 2011-06-23 NOTE — ED Notes (Signed)
Pt reports moved and has ran out of bp medication 2 or 3 days ago.  C/O feeling light headed.  Denies chest pain, headaches, or visual problems.

## 2011-06-23 NOTE — ED Notes (Signed)
Patient with no complaints at this time. Respirations even and unlabored. Skin warm/dry. Discharge instructions reviewed with patient at this time. Patient given opportunity to voice concerns/ask questions. IV removed per policy and band-aid applied to site. Patient discharged at this time and left Emergency Department with steady gait.  

## 2011-06-23 NOTE — Discharge Instructions (Signed)
Arterial Hypertension Arterial hypertension (high blood pressure) is a condition of elevated pressure in your blood vessels. Hypertension over a long period of time is a risk factor for strokes, heart attacks, and heart failure. It is also the leading cause of kidney (renal) failure.  CAUSES   In Adults -- Over 90% of all hypertension has no known cause. This is called essential or primary hypertension. In the other 10% of people with hypertension, the increase in blood pressure is caused by another disorder. This is called secondary hypertension. Important causes of secondary hypertension are:   Heavy alcohol use.   Obstructive sleep apnea.   Hyperaldosterosim (Conn's syndrome).   Steroid use.   Chronic kidney failure.   Hyperparathyroidism.   Medications.   Renal artery stenosis.   Pheochromocytoma.   Cushing's disease.   Coarctation of the aorta.   Scleroderma renal crisis.   Licorice (in excessive amounts).   Drugs (cocaine, methamphetamine).  Your caregiver can explain any items above that apply to you.  In Children -- Secondary hypertension is more common and should always be considered.   Pregnancy -- Few women of childbearing age have high blood pressure. However, up to 10% of them develop hypertension of pregnancy. Generally, this will not harm the woman. It may be a sign of 3 complications of pregnancy: preeclampsia, HELLP syndrome, and eclampsia. Follow up and control with medication is necessary.  SYMPTOMS   This condition normally does not produce any noticeable symptoms. It is usually found during a routine exam.   Malignant hypertension is a late problem of high blood pressure. It may have the following symptoms:   Headaches.   Blurred vision.   End-organ damage (this means your kidneys, heart, lungs, and other organs are being damaged).   Stressful situations can increase the blood pressure. If a person with normal blood pressure has their blood  pressure go up while being seen by their caregiver, this is often termed "white coat hypertension." Its importance is not known. It may be related with eventually developing hypertension or complications of hypertension.   Hypertension is often confused with mental tension, stress, and anxiety.  DIAGNOSIS  The diagnosis is made by 3 separate blood pressure measurements. They are taken at least 1 week apart from each other. If there is organ damage from hypertension, the diagnosis may be made without repeat measurements. Hypertension is usually identified by having blood pressure readings:  Above 140/90 mmHg measured in both arms, at 3 separate times, over a couple weeks.   Over 130/80 mmHg should be considered a risk factor and may require treatment in patients with diabetes.  Blood pressure readings over 120/80 mmHg are called "pre-hypertension" even in non-diabetic patients. To get a true blood pressure measurement, use the following guidelines. Be aware of the factors that can alter blood pressure readings.  Take measurements at least 1 hour after caffeine.   Take measurements 30 minutes after smoking and without any stress. This is another reason to quit smoking - it raises your blood pressure.   Use a proper cuff size. Ask your caregiver if you are not sure about your cuff size.   Most home blood pressure cuffs are automatic. They will measure systolic and diastolic pressures. The systolic pressure is the pressure reading at the start of sounds. Diastolic pressure is the pressure at which the sounds disappear. If you are elderly, measure pressures in multiple postures. Try sitting, lying or standing.   Sit at rest for a minimum of   5 minutes before taking measurements.   You should not be on any medications like decongestants. These are found in many cold medications.   Record your blood pressure readings and review them with your caregiver.  If you have hypertension:  Your caregiver  may do tests to be sure you do not have secondary hypertension (see "causes" above).   Your caregiver may also look for signs of metabolic syndrome. This is also called Syndrome X or Insulin Resistance Syndrome. You may have this syndrome if you have type 2 diabetes, abdominal obesity, and abnormal blood lipids in addition to hypertension.   Your caregiver will take your medical and family history and perform a physical exam.   Diagnostic tests may include blood tests (for glucose, cholesterol, potassium, and kidney function), a urinalysis, or an EKG. Other tests may also be necessary depending on your condition.  PREVENTION  There are important lifestyle issues that you can adopt to reduce your chance of developing hypertension:  Maintain a normal weight.   Limit the amount of salt (sodium) in your diet.   Exercise often.   Limit alcohol intake.   Get enough potassium in your diet. Discuss specific advice with your caregiver.   Follow a DASH diet (dietary approaches to stop hypertension). This diet is rich in fruits, vegetables, and low-fat dairy products, and avoids certain fats.  PROGNOSIS  Essential hypertension cannot be cured. Lifestyle changes and medical treatment can lower blood pressure and reduce complications. The prognosis of secondary hypertension depends on the underlying cause. Many people whose hypertension is controlled with medicine or lifestyle changes can live a normal, healthy life.  RISKS AND COMPLICATIONS  While high blood pressure alone is not an illness, it often requires treatment due to its short- and long-term effects on many organs. Hypertension increases your risk for:  CVAs or strokes (cerebrovascular accident).   Heart failure due to chronically high blood pressure (hypertensive cardiomyopathy).   Heart attack (myocardial infarction).   Damage to the retina (hypertensive retinopathy).   Kidney failure (hypertensive nephropathy).  Your caregiver can  explain list items above that apply to you. Treatment of hypertension can significantly reduce the risk of complications. TREATMENT   For overweight patients, weight loss and regular exercise are recommended. Physical fitness lowers blood pressure.   Mild hypertension is usually treated with diet and exercise. A diet rich in fruits and vegetables, fat-free dairy products, and foods low in fat and salt (sodium) can help lower blood pressure. Decreasing salt intake decreases blood pressure in a 1/3 of people.   Stop smoking if you are a smoker.  The steps above are highly effective in reducing blood pressure. While these actions are easy to suggest, they are difficult to achieve. Most patients with moderate or severe hypertension end up requiring medications to bring their blood pressure down to a normal level. There are several classes of medications for treatment. Blood pressure pills (antihypertensives) will lower blood pressure by their different actions. Lowering the blood pressure by 10 mmHg may decrease the risk of complications by as much as 25%. The goal of treatment is effective blood pressure control. This will reduce your risk for complications. Your caregiver will help you determine the best treatment for you according to your lifestyle. What is excellent treatment for one person, may not be for you. HOME CARE INSTRUCTIONS   Do not smoke.   Follow the lifestyle changes outlined in the "Prevention" section.   If you are on medications, follow the directions   carefully. Blood pressure medications must be taken as prescribed. Skipping doses reduces their benefit. It also puts you at risk for problems.   Follow up with your caregiver, as directed.   If you are asked to monitor your blood pressure at home, follow the guidelines in the "Diagnosis" section above.  SEEK MEDICAL CARE IF:   You think you are having medication side effects.   You have recurrent headaches or lightheadedness.     You have swelling in your ankles.   You have trouble with your vision.  SEEK IMMEDIATE MEDICAL CARE IF:   You have sudden onset of chest pain or pressure, difficulty breathing, or other symptoms of a heart attack.   You have a severe headache.   You have symptoms of a stroke (such as sudden weakness, difficulty speaking, difficulty walking).  MAKE SURE YOU:   Understand these instructions.   Will watch your condition.   Will get help right away if you are not doing well or get worse.  Document Released: 03/19/2005 Document Revised: 03/08/2011 Document Reviewed: 10/17/2006 [facility] Patient Information 2012 [place].  RESOURCE GUIDE  Dental Problems  Patients with Medicaid: [place] Phone:  [phone]                                                  Phone:  [phone]  If unable to pay or uninsured, contact:  [facility] or [facility]. to become qualified for the adult dental clinic.  Chronic Pain Problems Contact [facility]  [phone] Patients need to be referred by their primary care doctor.  Insufficient Money for Medicine Contact [employer]:  call "211" or [place] [phone].  No Primary Care Doctor Call [facility]  [phone] Other agencies that provide inexpensive medical care    [place]  [phone]    [facility]  [place]  [phone]    [facility]  [phone]    [facility]  [place]  [place]  [phone] [place]   [phone] (emergency services [phone])  Substance [facility]  [phone] [facility] [phone] The  [place] [phone] [place]a Pester [phone] Residential & Outpatient Substance Abuse Program  [phone]  Abuse/Neglect [place] [phone] [place] [phone] (After Hours)  Emergency [place] [phone]  [place] at the [place] [phone] [place] [phone]  MRSA Hotline #:   (231) 616-6670    [facility] [place]  [facility] Dept. [address]      [place] Phone:  [phone]                                   Phone:  [phone]                 Phone:  [place] Phone:  [place] [phone] (After Hours)

## 2012-01-23 ENCOUNTER — Emergency Department (HOSPITAL_COMMUNITY): Payer: Medicaid Other

## 2012-01-23 ENCOUNTER — Inpatient Hospital Stay (HOSPITAL_COMMUNITY)
Admission: EM | Admit: 2012-01-23 | Discharge: 2012-01-27 | DRG: 291 | Disposition: A | Discharge status: Home / Self Care | Payer: Medicaid Other | Attending: Internal Medicine | Admitting: Internal Medicine

## 2012-01-23 ENCOUNTER — Encounter (HOSPITAL_COMMUNITY): Payer: Self-pay | Admitting: *Deleted

## 2012-01-23 DIAGNOSIS — I5043 Acute on chronic combined systolic (congestive) and diastolic (congestive) heart failure: Secondary | ICD-10-CM

## 2012-01-23 DIAGNOSIS — N184 Chronic kidney disease, stage 4 (severe): Secondary | ICD-10-CM

## 2012-01-23 DIAGNOSIS — R809 Proteinuria, unspecified: Secondary | ICD-10-CM | POA: Diagnosis present

## 2012-01-23 DIAGNOSIS — Z9119 Patient's noncompliance with other medical treatment and regimen: Secondary | ICD-10-CM

## 2012-01-23 DIAGNOSIS — Z23 Encounter for immunization: Secondary | ICD-10-CM

## 2012-01-23 DIAGNOSIS — I43 Cardiomyopathy in diseases classified elsewhere: Secondary | ICD-10-CM | POA: Diagnosis present

## 2012-01-23 DIAGNOSIS — N189 Chronic kidney disease, unspecified: Secondary | ICD-10-CM

## 2012-01-23 DIAGNOSIS — E876 Hypokalemia: Secondary | ICD-10-CM | POA: Diagnosis present

## 2012-01-23 DIAGNOSIS — N179 Acute kidney failure, unspecified: Secondary | ICD-10-CM | POA: Diagnosis present

## 2012-01-23 DIAGNOSIS — I509 Heart failure, unspecified: Secondary | ICD-10-CM | POA: Diagnosis present

## 2012-01-23 DIAGNOSIS — Z9114 Patient's other noncompliance with medication regimen: Secondary | ICD-10-CM

## 2012-01-23 DIAGNOSIS — Z91199 Patient's noncompliance with other medical treatment and regimen due to unspecified reason: Secondary | ICD-10-CM

## 2012-01-23 DIAGNOSIS — D509 Iron deficiency anemia, unspecified: Secondary | ICD-10-CM | POA: Diagnosis present

## 2012-01-23 DIAGNOSIS — I130 Hypertensive heart and chronic kidney disease with heart failure and stage 1 through stage 4 chronic kidney disease, or unspecified chronic kidney disease: Principal | ICD-10-CM | POA: Diagnosis present

## 2012-01-23 DIAGNOSIS — Z880 Allergy status to penicillin: Secondary | ICD-10-CM

## 2012-01-23 DIAGNOSIS — I169 Hypertensive crisis, unspecified: Secondary | ICD-10-CM

## 2012-01-23 DIAGNOSIS — Z91148 Patient's other noncompliance with medication regimen for other reason: Secondary | ICD-10-CM

## 2012-01-23 HISTORY — DX: Chronic kidney disease, unspecified: N18.9

## 2012-01-23 HISTORY — DX: Patient's other noncompliance with medication regimen: Z91.14

## 2012-01-23 HISTORY — DX: Iron deficiency anemia, unspecified: D50.9

## 2012-01-23 HISTORY — DX: Proteinuria, unspecified: R80.9

## 2012-01-23 HISTORY — DX: Personal history of other medical treatment: Z92.89

## 2012-01-23 HISTORY — DX: Chronic combined systolic (congestive) and diastolic (congestive) heart failure: I50.42

## 2012-01-23 LAB — CBC WITH DIFFERENTIAL/PLATELET
Eosinophils Relative: 3 % (ref 0–5)
Lymphocytes Relative: 24 % (ref 12–46)
Lymphs Abs: 1.8 10*3/uL (ref 0.7–4.0)
MCV: 83.3 fL (ref 78.0–100.0)
Platelets: 304 10*3/uL (ref 150–400)
RBC: 4.3 MIL/uL (ref 4.22–5.81)
WBC: 7.5 10*3/uL (ref 4.0–10.5)

## 2012-01-23 LAB — CBC
HCT: 32.8 % — ABNORMAL LOW (ref 39.0–52.0)
MCH: 26.9 pg (ref 26.0–34.0)
MCV: 83.9 fL (ref 78.0–100.0)
RDW: 14.9 % (ref 11.5–15.5)
WBC: 8 10*3/uL (ref 4.0–10.5)

## 2012-01-23 LAB — TROPONIN I: Troponin I: 0.54 ng/mL (ref <0.30)

## 2012-01-23 LAB — CREATININE, SERUM: GFR calc Af Amer: 25 mL/min — ABNORMAL LOW (ref >90)

## 2012-01-23 LAB — URINALYSIS, ROUTINE W REFLEX MICROSCOPIC
Glucose, UA: NEGATIVE mg/dL
Leukocytes, UA: NEGATIVE
Specific Gravity, Urine: 1.025 (ref 1.005–1.030)
pH: 6 (ref 5.0–8.0)

## 2012-01-23 LAB — BASIC METABOLIC PANEL
CO2: 20 mEq/L (ref 19–32)
Calcium: 9.3 mg/dL (ref 8.4–10.5)
Chloride: 104 mEq/L (ref 96–112)
Potassium: 3.2 mEq/L — ABNORMAL LOW (ref 3.5–5.1)
Sodium: 138 mEq/L (ref 135–145)

## 2012-01-23 LAB — PRO B NATRIURETIC PEPTIDE: Pro B Natriuretic peptide (BNP): 6339 pg/mL — ABNORMAL HIGH (ref 0–125)

## 2012-01-23 IMAGING — CR DG CHEST 2V
2 series · 2 of 2 positions shown · non-contrast
Comparison: [DATE]

CLINICAL DATA: Cough.  Shortness of breath and dizziness.

CHEST - 2 VIEW

[view not recorded (1 of 2)]
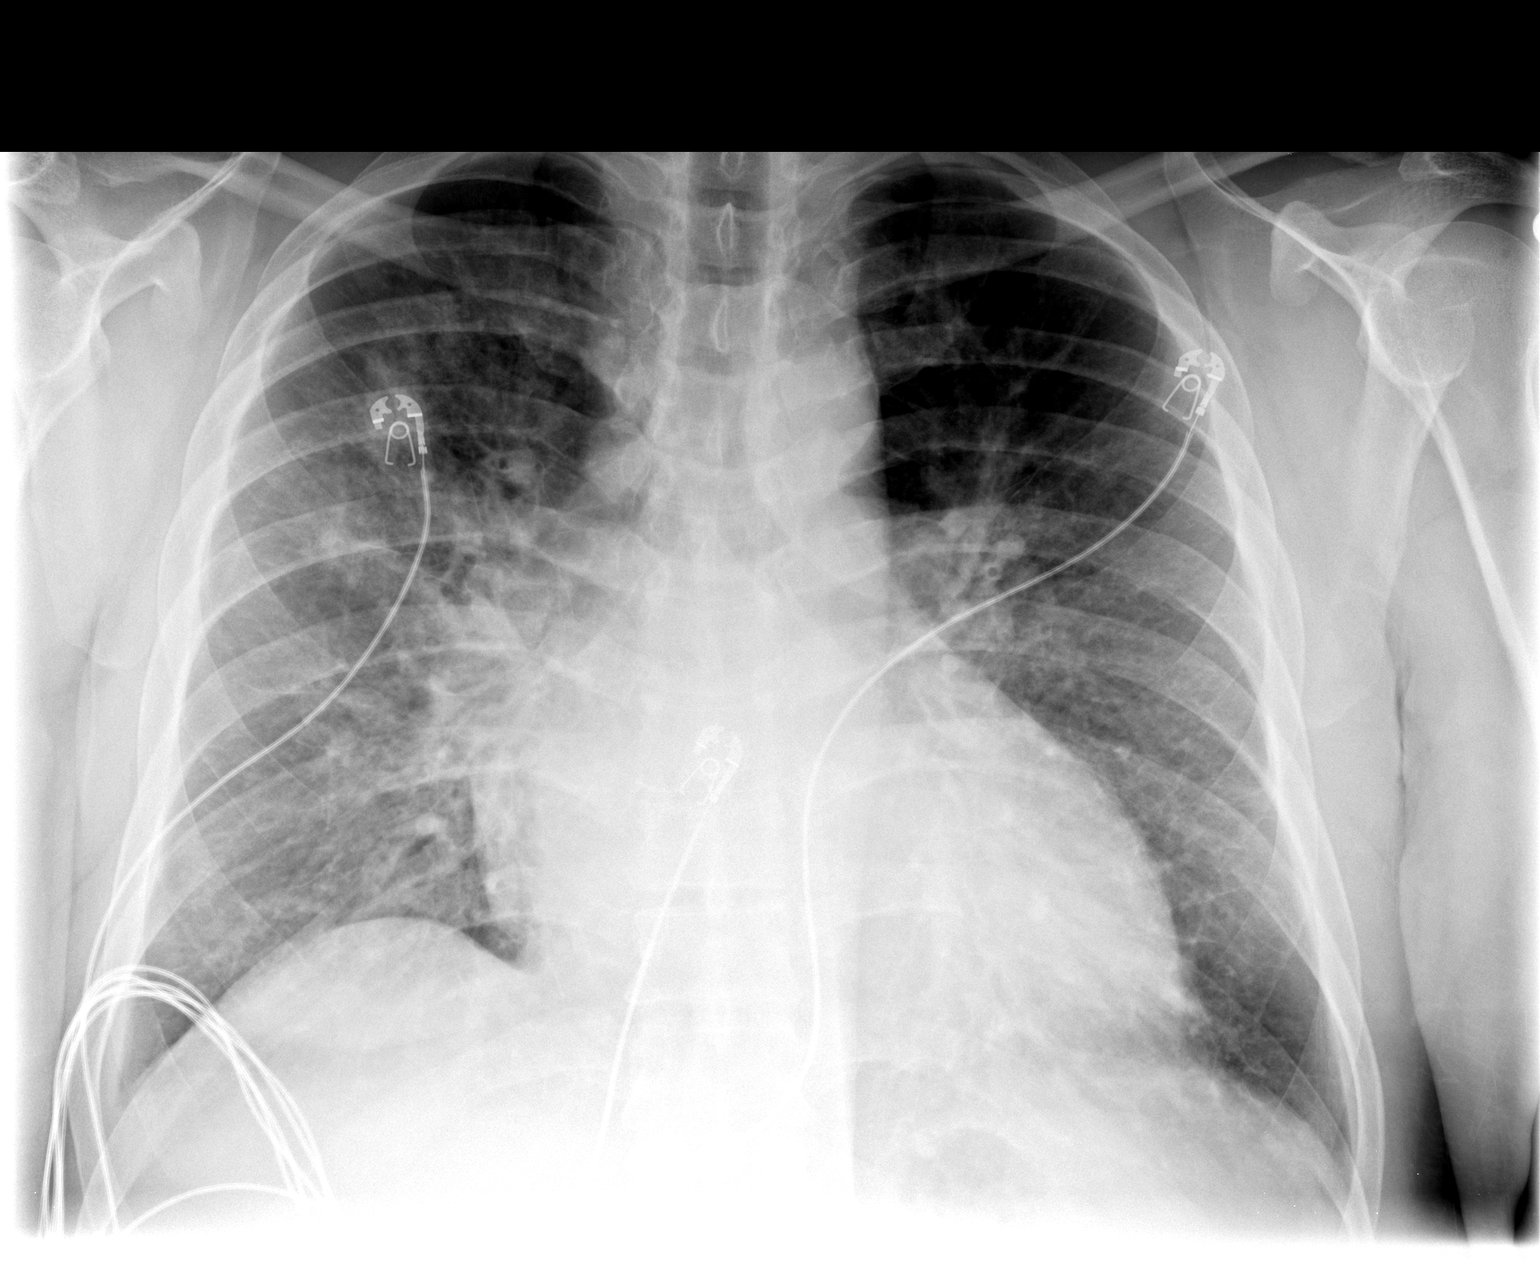

[view not recorded (2 of 2)]
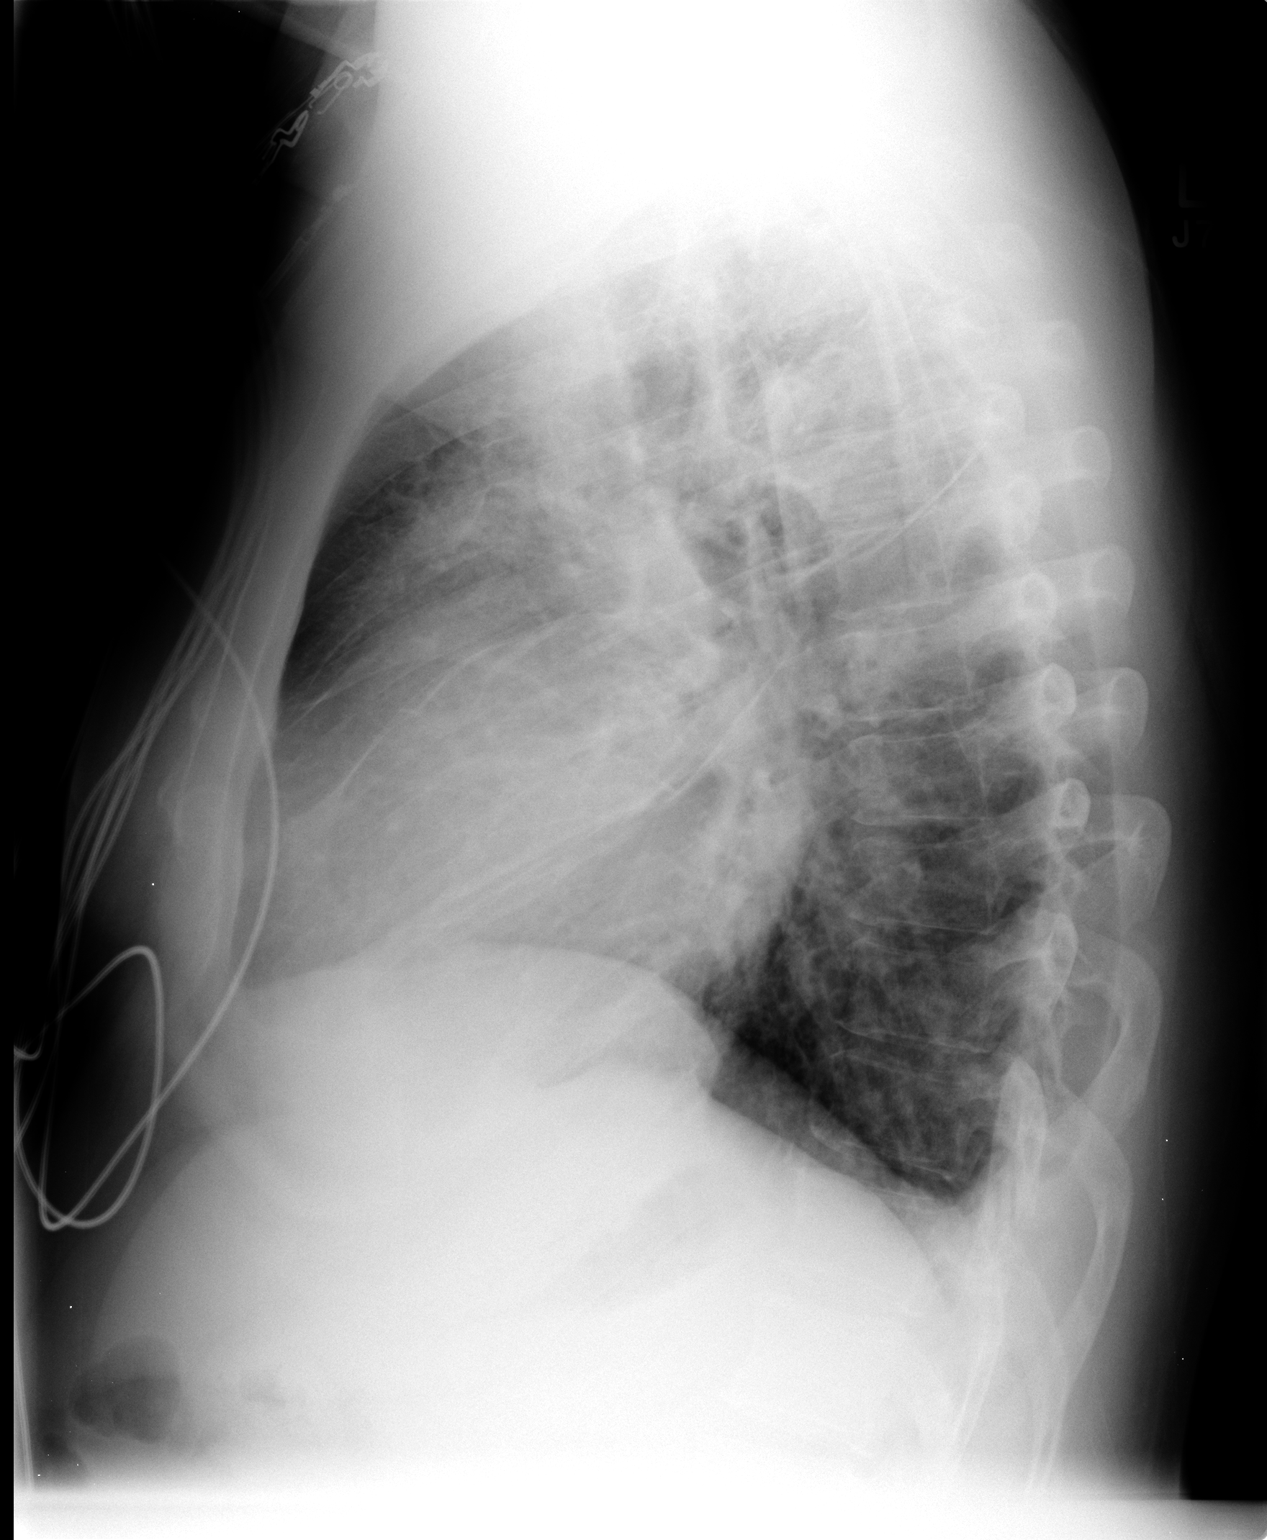

[2 of 2 positions shown; findings below may reference images not displayed]

FINDINGS: Cardiomegaly noted with indistinct pulmonary vasculature,
airway thickening, interstitial accentuation, and blunting of the
right posterior costophrenic angle.  Mild thickening of the
fissures noted on the lateral projection.
IMPRESSION: 1.  Cardiomegaly with interstitial opacity and a small right
pleural effusion.  Appearance favors interstitial pulmonary edema,
with atypical pneumonia a less likely differential diagnostic
consideration.

## 2012-01-23 MED ORDER — FUROSEMIDE 10 MG/ML IJ SOLN
80.0000 mg | Freq: Two times a day (BID) | INTRAMUSCULAR | Status: AC
  Administered 2012-01-24: 80 mg via INTRAVENOUS

## 2012-01-23 MED ORDER — HYDRALAZINE HCL 25 MG PO TABS
25.0000 mg | ORAL_TABLET | Freq: Three times a day (TID) | ORAL | Status: DC
  Administered 2012-01-23 – 2012-01-25 (×5): 25 mg via ORAL
  Filled 2012-01-23 (×9): qty 1

## 2012-01-23 MED ORDER — SODIUM CHLORIDE 0.9 % IV SOLN: 250.0000 mL | INTRAVENOUS | Status: DC | PRN

## 2012-01-23 MED ORDER — SODIUM CHLORIDE 0.9 % IJ SOLN: 3.0000 mL | INTRAMUSCULAR | Status: DC | PRN

## 2012-01-23 MED ORDER — AMLODIPINE BESYLATE 10 MG PO TABS
10.0000 mg | ORAL_TABLET | Freq: Every day | ORAL | Status: DC
  Administered 2012-01-24 – 2012-01-27 (×4): 10 mg via ORAL
  Filled 2012-01-23 (×5): qty 1

## 2012-01-23 MED ORDER — POTASSIUM CHLORIDE CRYS ER 20 MEQ PO TBCR
40.0000 meq | EXTENDED_RELEASE_TABLET | Freq: Once | ORAL | Status: AC
  Administered 2012-01-23: 40 meq via ORAL
  Filled 2012-01-23: qty 2

## 2012-01-23 MED ORDER — ACETAMINOPHEN 325 MG PO TABS: 650.0000 mg | ORAL_TABLET | ORAL | Status: DC | PRN

## 2012-01-23 MED ORDER — HEPARIN SODIUM (PORCINE) 5000 UNIT/ML IJ SOLN
5000.0000 [IU] | Freq: Three times a day (TID) | INTRAMUSCULAR | Status: DC
  Administered 2012-01-23 – 2012-01-27 (×11): 5000 [IU] via SUBCUTANEOUS
  Filled 2012-01-23 (×16): qty 1

## 2012-01-23 MED ORDER — NITROGLYCERIN 2 % TD OINT
0.5000 [in_us] | TOPICAL_OINTMENT | Freq: Once | TRANSDERMAL | Status: AC
  Administered 2012-01-23: 0.5 [in_us] via TOPICAL
  Filled 2012-01-23: qty 1

## 2012-01-23 MED ORDER — FUROSEMIDE 10 MG/ML IJ SOLN: 80.0000 mg | Freq: Two times a day (BID) | INTRAMUSCULAR | Status: DC

## 2012-01-23 MED ORDER — ISOSORBIDE MONONITRATE ER 30 MG PO TB24
30.0000 mg | ORAL_TABLET | Freq: Every day | ORAL | Status: DC
  Administered 2012-01-24 – 2012-01-27 (×4): 30 mg via ORAL
  Filled 2012-01-23 (×5): qty 1

## 2012-01-23 MED ORDER — CARVEDILOL 25 MG PO TABS: 25.0000 mg | ORAL_TABLET | Freq: Two times a day (BID) | ORAL | Status: DC

## 2012-01-23 MED ORDER — FUROSEMIDE 10 MG/ML IJ SOLN
40.0000 mg | Freq: Once | INTRAMUSCULAR | Status: AC
  Administered 2012-01-23: 40 mg via INTRAVENOUS
  Filled 2012-01-23: qty 4

## 2012-01-23 MED ORDER — ASPIRIN 81 MG PO CHEW
324.0000 mg | CHEWABLE_TABLET | Freq: Once | ORAL | Status: AC
  Administered 2012-01-23: 324 mg via ORAL
  Filled 2012-01-23: qty 4

## 2012-01-23 MED ORDER — CARVEDILOL 6.25 MG PO TABS
6.2500 mg | ORAL_TABLET | Freq: Two times a day (BID) | ORAL | Status: DC
  Administered 2012-01-24 – 2012-01-27 (×7): 6.25 mg via ORAL
  Filled 2012-01-23 (×11): qty 1

## 2012-01-23 MED ORDER — ONDANSETRON HCL 4 MG/2ML IJ SOLN: 4.0000 mg | Freq: Four times a day (QID) | INTRAMUSCULAR | Status: DC | PRN

## 2012-01-23 MED ORDER — SODIUM CHLORIDE 0.9 % IJ SOLN
3.0000 mL | Freq: Two times a day (BID) | INTRAMUSCULAR | Status: DC
  Administered 2012-01-23 – 2012-01-27 (×7): 3 mL via INTRAVENOUS

## 2012-01-23 MED ORDER — HYDRALAZINE HCL 20 MG/ML IJ SOLN
10.0000 mg | INTRAMUSCULAR | Status: DC | PRN
  Administered 2012-01-23 – 2012-01-24 (×2): 10 mg via INTRAVENOUS
  Filled 2012-01-23: qty 0.5
  Filled 2012-01-23 (×4): qty 1

## 2012-01-23 MED ORDER — INFLUENZA VIRUS VACC SPLIT PF IM SUSP
0.5000 mL | INTRAMUSCULAR | Status: AC
  Administered 2012-01-24: 0.5 mL via INTRAMUSCULAR
  Filled 2012-01-23: qty 0.5

## 2012-01-23 MED ORDER — CARVEDILOL 12.5 MG PO TABS
25.0000 mg | ORAL_TABLET | Freq: Once | ORAL | Status: AC
  Administered 2012-01-23: 25 mg via ORAL
  Filled 2012-01-23: qty 2

## 2012-01-23 MED ORDER — ONDANSETRON HCL 4 MG/2ML IJ SOLN: 4.0000 mg | Freq: Three times a day (TID) | INTRAMUSCULAR | Status: DC | PRN

## 2012-01-23 NOTE — ED Provider Notes (Signed)
History     CSN: [id number]  Arrival date & time 01/23/12  [id number]   First MD Initiated Contact with Patient 01/23/12 0827      Chief Complaint  Patient presents with  . Hypertension  . Dizziness    (Consider location/radiation/quality/duration/timing/severity/associated sxs/prior treatment) HPI Comments: Timothy Good presents with a 24-hour history of intermittent lightheadedness which is fleeting in character and makes him suspicious that his blood pressure may be elevated.  He has a history of hypertension, CHF and stage IV renal disease and ran out of his [place] approximately 2 months ago.  He denies headaches and focal weakness, but has noticed increased shortness of breath, worse with supine and with exertion.  He also had a 30 minute episode of midsternal chest pain 2 hours before arrival which occurred at rest and resolved without intervention.  He describes the sensation as burning in character and believes it was heartburn.  He has had swelling in his right ankle this past week but he states he got better several days ago.  He has had no visual changes.  The history is provided by the patient.    Past Medical History  Diagnosis Date  . Hypertension   . CHF (congestive heart failure)   . Renal disorder     stage IV kidney disease    History reviewed. No pertinent past surgical history.  No family history on file.  History  Substance Use Topics  . Smoking status: Never Smoker   . Smokeless tobacco: Not on file  . Alcohol Use: No     occ      Review of Systems  Constitutional: Negative for fever.  HENT: Negative for congestion, sore throat and neck pain.   Eyes: Negative.  Negative for visual disturbance.  Respiratory: Positive for cough and shortness of breath. Negative for chest tightness.   Cardiovascular: Positive for chest pain and leg swelling.  Gastrointestinal: Negative for nausea and abdominal pain.  Genitourinary: Negative.   Musculoskeletal:  Negative for joint swelling and arthralgias.  Skin: Negative.  Negative for rash and wound.  Neurological: Positive for light-headedness. Negative for dizziness, weakness, numbness and headaches.  Hematological: Negative.   Psychiatric/Behavioral: Negative.     Allergies  Penicillins  Home Medications   No current outpatient prescriptions on file.  BP 188/131  Pulse 112  Temp 97.9 F (36.6 C)  Resp 20  Ht 6\' 2"  (1.88 m)  SpO2 94%  Physical Exam  Nursing note and vitals reviewed. Constitutional: He appears well-developed and well-nourished.  HENT:  Head: Normocephalic and atraumatic.  Eyes: Conjunctivae normal are normal.  Fundoscopic exam:      The right eye shows no papilledema.       The left eye shows no papilledema.  Neck: Normal range of motion.  Cardiovascular: Regular rhythm and intact distal pulses.  Tachycardia present.  Exam reveals gallop.        Trace edema right ankle.  Pulmonary/Chest: Effort normal. He has no wheezes. He has rales in the right lower field, the left middle field and the left lower field.  Abdominal: Soft. Bowel sounds are normal. There is no tenderness.  Musculoskeletal: Normal range of motion.  Neurological: He is alert.  Skin: Skin is warm and dry.  Psychiatric: He has a normal mood and affect.    ED Course  Procedures (including critical care time)  Labs Reviewed  BASIC METABOLIC PANEL - Abnormal; Notable for the following:    Potassium 3.2 (*)  Glucose, Bld 115 (*)     BUN 34 (*)     Creatinine, Ser 3.07 (*)     GFR calc non Af Amer 23 (*)     GFR calc Af Amer 27 (*)     All other components within normal limits  CBC WITH DIFFERENTIAL - Abnormal; Notable for the following:    Hemoglobin 11.8 (*)     HCT 35.8 (*)     All other components within normal limits  TROPONIN I - Abnormal; Notable for the following:    Troponin I 0.54 (*)     All other components within normal limits  PRO B NATRIURETIC PEPTIDE - Abnormal;  Notable for the following:    Pro B Natriuretic peptide (BNP) 6339.0 (*)     All other components within normal limits  URINALYSIS, ROUTINE W REFLEX MICROSCOPIC   Dg Chest 2 View  01/23/2012  *RADIOLOGY REPORT*  Clinical Data: Cough.  Shortness of breath and dizziness.  CHEST - 2 VIEW  Comparison: 05/31/2010  Findings: Cardiomegaly noted with indistinct pulmonary vasculature, airway thickening, interstitial accentuation, and blunting of the right posterior costophrenic angle.  Mild thickening of the fissures noted on the lateral projection.  IMPRESSION:  1.  Cardiomegaly with interstitial opacity and a small right pleural effusion.  Appearance favors interstitial pulmonary edema, with atypical pneumonia a less likely differential diagnostic consideration.   Original Report Authenticated By: Carron Curie, M.D.      1. CHF (congestive heart failure)   2. Chronic renal insufficiency, stage IV (severe)     10:33 AM pt comfortable, no chest pain,  No sob.  BP 160/112 on monitor.  Agreeable with admission after discussing lab results.  Labs reviewed.  Old charts and procedures reviewed.  Myoview 06/03/11 - no reproducible ischemia,  14% EF.  11:22 AM Call placed to Dr. Anastasio Champion.  Defers to [facility] discretion for admission.  Call placed to [facility] in [place] - accepted to Dr. Renee Pain service.  MDM  chf with uncontrolled bp/ hypertensive urgency.  Renal insuffiency with worsened creatinine.  Pt to be admitted for further evaluation/ with elevated troponin may consider cardiac cath to r/o  Cad, although abnormal troponin could be reflection of renal insufficiency.    Date: 01/23/2012  Rate: 117  Rhythm: sinus tachycardia  QRS Axis: normal  Intervals: QT prolonged  ST/T Wave abnormalities: ST depressions laterally and early repolarization  Conduction Disutrbances:none  Narrative Interpretation:   Old EKG Reviewed: changes noted    Date: 01/23/2012  Rate: 84  Rhythm:  normal sinus rhythm  QRS Axis: normal  Intervals: QT prolonged  ST/T Wave abnormalities: ST depressions laterally and early repolarization  Conduction Disutrbances:none  Narrative Interpretation:   Old EKG Reviewed: changes noted        Evalee Jefferson, PA 01/23/12 1616

## 2012-01-23 NOTE — ED Notes (Addendum)
Pt presents to er with c/o dizziness and elevated blood pressure, pt states that he first noticed some dizziness yesterday around lunch time, states that the dizziness went away and then returned this am, pt also states that he feels like this when his blood pressure is elevated.  Pt states that he has been out of his blood pressure medication for "a while"

## 2012-01-23 NOTE — ED Notes (Signed)
Receiving nurse called back. Report given to Tim,RN.

## 2012-01-23 NOTE — ED Notes (Signed)
Patient states he does not need anything at this time. 

## 2012-01-23 NOTE — ED Notes (Signed)
Attempted to call report. Nurse unable to take report. Nurse stated would call back for report.

## 2012-01-23 NOTE — H&P (Signed)
History and Physical  Patient ID: Timothy Good MRN: [id number], SOB: [birth year] 46 y.o. Date of Encounter: 01/23/2012, 3:16 PM  Primary Physician: None Primary Cardiologist: Dr. Verl Blalock in consultation 2012  Chief Complaint: lightheadedness  HPI: 46 y.o. male w/ PMHx significant for Chronic Combined Diastolic/Systolic CHF (EF 0000000 w/ grade 3 diastolic dysfunction by echo 2012), Uncontrolled HTN, CKD Stage IV, and Medication Noncompliance who transferred from [facility] to [facility] on 01/23/2012 with complaints of dizziness and found to have markedly elevated BP, elevated troponin, and pulmonary edema.  He was evaluated in consultation by Dr. Verl Blalock in February 2012 for newly diagnosed CHF and uncontrolled HTN. At that time echo showed mod-severe LVH, EF 20-25% with akinesis of the inferior myocardium, severe hypokinesis of the anteroseptal myocardial and grade 3 diastolic dysfunction. EKG showed lateral TW changes. There was concern for ischemic heart disease, but given his renal insufficiency he was not a good candidate for cath and therefore had a stress myoview. Lexiscan Myoview was negative for inducible ischemia with an EF of 14%. It was felt his cardiomyopathy was more likely from uncontrolled HTN. Renal US at that time showed no hydronephrosis. He was discharged on ASA, Lasix, Coreg, Norvasc, and Hydralazine.  He reports he was unable to get those medications filled or get regular medical care due to lack of insurance. About 3 months ago he went to the [facility] ED due to lightheadedness and was told his BP was ok but his potassium was low so this was supplemented and he was discharged home. He was feeling well until yesterday when he felt lightheaded and short of breath which he thought was due to having high blood pressure prompting him to present to the [facility] ED. Does have chronic dyspnea on exertion after walking about 20yds or going up steps. Denies chest pain,  orthopnea, PND, or syncope. Yesterday had swelling in right ankle, but it only lasted a day and does not normally have LE edema. States he tries to eat a low sodium diet. Does not weigh himself.   At [facility], EKG showed NSR 84bpm, LVH, lateral TWI unchanged from prior EKG. CXR showed cardiomegaly with interstitial opacity and small right pleural effusion favoring interstitial pulmonary edema. Labs were significant for troponin 0.54, pBNP 6339, K+ 3.2, BUN/Crt 34/3.07, Hgb 11.8. BP initially 227/150 with rates 110s. He received NTG paste, 324mg  ASA, 40mg  IV Lasix, 25mg  Coreg and was transferred to [facility] stepdown unit. Currently feels well without complaints. BP 160/191.   Past Medical History  Diagnosis Date  . Hypertension     Uncontrolled  . Chronic combined systolic and diastolic CHF (congestive heart failure)     Echo 06/2010 EF 20-25%, akinesis of the inferior myocardium, severe hypokinesis of the anteroseptal myocardial and grade 3 diastolic dysfunction  . CKD (chronic kidney disease)     stage IV   . H/O medication noncompliance   . H/O cardiovascular stress test     Lexiscan Myoview 06/2010 was negative for inducible ischemia with an EF of 14%.    06/2010 - Myoview Findings: Spect: No definite inducible or reversible ischemia detected with pharmacologic stress. No fixed defects. Wall motion: Left ventricle is dilated with diffuse global hypokinesis. Ejection fraction: Calculated Q G S ejection fraction is markedly decreased measuring 14%. IMPRESSION: Negative for inducible ischemia with pharmacologic stress. Left ventricular dilatation with global hypokinesis. Markedly decreased 14% ejection fraction   06/2010 - Echo Study Conclusions: - Left ventricle: The  cavity size was mildly dilated. Wall thickness was increased in a pattern of moderate to severe LVH. Systolic function was severely reduced. The estimated ejection fraction was in the range of 20% to 25%. Akinesis of the  inferior myocardium. Severe hypokinesis of the anteroseptal myocardium. Doppler parameters are consistent with a reversible restrictive pattern, indicative of decreased left ventricular diastolic compliance and/or increased left atrial pressure (grade 3 diastolic dysfunction). - Aortic valve: Mild regurgitation. - Mitral valve: Mild regurgitation. - Left atrium: The atrium was moderately dilated. - Right ventricle: The cavity size was mildly dilated. - Right atrium: The atrium was mildly dilated. - Pulmonary arteries: Systolic pressure was mildly increased. PA peak pressure: 13mm Hg (S). - Pericardium, extracardiac: A small pericardial effusion was identified.   Surgical History:  Past Surgical History  Procedure Date  . None     Home Meds: None  Allergies:  Allergies  Allergen Reactions  . Penicillins Other (See Comments)    Lost mobility in legs for a week.     History   Social History  . Marital Status: Single    Spouse Name: N/A    Number of Children: N/A  . Years of Education: N/A   Occupational History  . Not on file.   Social History Main Topics  . Smoking status: Never Smoker   . Smokeless tobacco: Never Used  . Alcohol Use: No  . Drug Use: No  . Sexually Active: Not on file   Other Topics Concern  . Not on file   Social History Narrative   Lives in [place] with friend. Has one son who lives in [place]. On disability due to hypertension.     Family History  Problem Relation Age of Onset  . Heart failure Mother 3    "died of a weak heart from smoking"  . Other      No known heart disease     Review of Systems: General: negative for chills, fever, night sweats or weight changes.  Cardiovascular: (+) shortness of breath, dyspnea on exertion; negative for chest pain, edema, orthopnea, palpitations, paroxysmal nocturnal dyspnea  Dermatological: negative for rash Respiratory: (+) nonproductive cough, wheezing Urologic: negative for  hematuria Abdominal: negative for nausea, vomiting, diarrhea, bright red blood per rectum, melena, or hematemesis Neurologic: (+) lightheaded; negative for visual changes, syncope All other systems reviewed and are otherwise negative except as noted above.  Labs:   Component Value Date   WBC 7.5 01/23/2012   HGB 11.8* 01/23/2012   HCT 35.8* 01/23/2012   MCV 83.3 01/23/2012   PLT 304 01/23/2012    Lab 01/23/12 0828  NA 138  K 3.2*  CL 104  CO2 20  BUN 34*  CREATININE 3.07*  CALCIUM 9.3  GLUCOSE 115*   Basename 01/23/12 0828  TROPONINI 0.54*     01/23/2012 08:45  Pro B Natriuretic peptide (BNP) 6339.0 (H)    Radiology/Studies:   01/23/2012 - CHEST - 2 VIEW   Findings: Cardiomegaly noted with indistinct pulmonary vasculature, airway thickening, interstitial accentuation, and blunting of the right posterior costophrenic angle.  Mild thickening of the fissures noted on the lateral projection.  IMPRESSION:  1.  Cardiomegaly with interstitial opacity and a small right pleural effusion.  Appearance favors interstitial pulmonary edema, with atypical pneumonia a less likely differential diagnostic consideration.      EKG: 01/23/12 @ 1012 - NSR 84bpm, LVH, lateral TWI unchanged from prior EKG  Physical Exam: Blood pressure 160/119, pulse 80, temperature 97.9 F (36.6 C),  resp. rate 27, height 6\' 2"  (1.88 m), SpO2 95.00%. General: Very pleasant well developed black male, in no acute distress. Head: Normocephalic, atraumatic, sclera non-icteric, nares are without discharge Neck: Supple. Negative for carotid bruits. JVD to mid neck Lungs: Rales in Left base and to mid lung field on the Right; No wheezes or rhonchi. Breathing is unlabored. Heart: RRR with S1 S2. No murmurs, rubs, or gallops appreciated. Abdomen: Soft, non-tender, non-distended with normoactive bowel sounds. No rebound/guarding. No obvious abdominal masses. Msk:  Strength and tone appear normal for age. Extremities:  1+ bilat ankle edema. No clubbing or cyanosis. Distal pedal pulses are intact and equal bilaterally. Neuro: Alert and oriented X 3. Moves all extremities spontaneously. Psych:  Responds to questions appropriately with a normal affect.    ASSESSMENT AND PLAN:  46 y.o. male w/ PMHx significant for Chronic Combined Diastolic/Systolic CHF (EF 0000000 w/ grade 3 diastolic dysfunction by echo 2012), Uncontrolled HTN, CKD Stage IV, and Medication Noncompliance who transferred from [facility] to [facility] on 01/23/2012 with complaints of dizziness and found to have markedly elevated BP, elevated troponin, and pulmonary edema.  1. Hypertensive Emergency 2. Acute on Chronic Combined Diastolic/Systolic CHF - EF 0000000 w/ grade 3 diastolic dysfunction in 0000000 3. Elevated troponin 4. Acute on Chronic Renal Insufficiency, Stage IV 5. Hypokalemia 6. Medication Noncompliance due to financial constraints   Signed, HOPE, JESSICA PA-C 01/23/2012, 3:16 PM  Patient seen and examined independently. [facility], PA-C note reviewed carefully - agree with her assessment and plan. I have edited the note based on my findings. I have reviewed previous echo and Myoview images personally.   Timothy Good is a 46 y/o male with longstanding severe HTN complicated by systolic HF and renal failure. Now admitted with recurrent HTN crisis in the setting of being out of his meds x more than 6 months.   On exam BP markedly elevated with mild volume overload. Cr now up to 3.1.   Agree with diuresis with tow doses of IV lasix and then switch to po. Control BP with hydralazine, carvedilol, amlodipine with goal SBP 140-160 for now. Would repeat echo. Discussed the fact that he will likely need HD in the near future. Will ask Renal to see for education and possible vein mapping.  Will also need outpatient sleep study. No ace-i/ARB or spiro due to renal failure.   Timothy Allan,MD 4:12 PM

## 2012-01-23 NOTE — ED Notes (Signed)
CRITICAL VALUE ALERT  Critical value received: troponin 0.54  Date of notification:  01/23/12  Time of notification:  0922  Critical value read back:yes  Nurse who received alert:  c Wisdom Rickey rn  MD notified (1st page):  Dr Thurnell Garbe  Time of first page:  0922  MD notified (2nd page):  Time of second page:  Responding MD:  Dr Burnard Leigh  Time MD responded:  [phone]

## 2012-01-23 NOTE — ED Notes (Signed)
Pharmacy notified for 25mg  Carvedilol. PA and pt aware of delay.

## 2012-01-24 DIAGNOSIS — I509 Heart failure, unspecified: Secondary | ICD-10-CM

## 2012-01-24 LAB — BASIC METABOLIC PANEL
BUN: 36 mg/dL — ABNORMAL HIGH (ref 6–23)
Chloride: 108 mEq/L (ref 96–112)
GFR calc Af Amer: 27 mL/min — ABNORMAL LOW (ref >90)
Potassium: 3.5 mEq/L (ref 3.5–5.1)

## 2012-01-24 LAB — TSH: TSH: 0.93 u[IU]/mL (ref 0.350–4.500)

## 2012-01-24 NOTE — Clinical Social Work Psychosocial (Signed)
     Clinical Social Work Department BRIEF PSYCHOSOCIAL ASSESSMENT 01/24/2012  Patient:  Timothy Good, Timothy Good     Account Number:  [id number]     Admit date:  01/23/2012  Clinical Social Worker:  Otilio Saber  Date/Time:  01/24/2012 11:45 AM  Referred by:  RN  Date Referred:  01/23/2012 Referred for  Other - See comment   Other Referral:   Interview type:  Patient Other interview type:    PSYCHOSOCIAL DATA Living Status:  ALONE Admitted from facility:   Level of care:   Primary support name:  Enis Slipper Primary support relationship to patient:  FRIEND Degree of support available:   adequate    CURRENT CONCERNS Current Concerns  None Noted   Other Concerns:    SOCIAL WORK ASSESSMENT / PLAN Clinical Social Worker received consult regarding patient needing transportation. CSW spoke with patient regarding concern and patient reported that he did not have issues with transportation. Patient did not report any other concerns. CSW will sign off, as social work intevention is no longer needed.   Assessment/plan status:  No Further Intervention Required Other assessment/ plan:   Information/referral to community resources:   CSW provided patient with transportation and mobility services if needed.    PATIENTS/FAMILYS RESPONSE TO PLAN OF CARE: Patient reported that he did not have any issues with transportation and was appreciative of the resource with the transportation services if needed.

## 2012-01-24 NOTE — Progress Notes (Signed)
  Echocardiogram 2D Echocardiogram has been performed.  Shalla Bulluck, [place] 01/24/2012, 10:40 AM

## 2012-01-24 NOTE — Progress Notes (Signed)
Patient ID: Timothy Good, male   DOB: Jan 19, 1966, 46 y.o.   MRN: [id number]    Subjective:  Denies SSCP, palpitations or Dyspnea   Objective:  Filed Vitals:   01/24/12 0600 01/24/12 0626 01/24/12 0635 01/24/12 0759  BP: 158/112 158/112  160/109  Pulse: 83   89  Temp:    98.4 F (36.9 C)  TempSrc:    Oral  Resp: 22     Height:      Weight:   227 lb 4.7 oz (103.1 kg)   SpO2: 99%   95%    Intake/Output from previous day:  Intake/Output Summary (Last 24 hours) at 01/24/12 0827 Last data filed at 01/24/12 [id number]  Gross per 24 hour  Intake    240 ml  Output   1250 ml  Net  -1010 ml    Physical Exam: Affect appropriate Middle aged black male HEENT: normal Neck supple with no adenopathy JVP normal no bruits no thyromegaly Lungs clear with no wheezing and good diaphragmatic motion Heart:  S1/S2 no murmur, no rub, gallop or click PMI enlarged Abdomen: benighn, BS positve, no tenderness, no AAA no bruit.  No HSM or HJR Distal pulses intact with no bruits No edema Neuro non-focal Skin warm and dry No muscular weakness   Lab Results: Basic Metabolic Panel:  Basename 01/24/12 0430 01/23/12 1720 01/23/12 0828  NA 143 -- 138  K 3.5 -- 3.2*  CL 108 -- 104  CO2 20 -- 20  GLUCOSE 93 -- 115*  BUN 36* -- 34*  CREATININE 3.08* 3.21* --  CALCIUM 9.0 -- 9.3  MG -- -- --  PHOS -- -- --   Liver Function Tests: No results found for this basename: AST:2,ALT:2,ALKPHOS:2,BILITOT:2,PROT:2,ALBUMIN:2 in the last 72 hours No results found for this basename: LIPASE:2,AMYLASE:2 in the last 72 hours CBC:  Basename 01/23/12 1720 01/23/12 0828  WBC 8.0 7.5  NEUTROABS -- 5.2  HGB 10.5* 11.8*  HCT 32.8* 35.8*  MCV 83.9 83.3  PLT 309 304   Cardiac Enzymes:  Basename 01/23/12 0828  CKTOTAL --  CKMB --  CKMBINDEX --  TROPONINI 0.54*    Imaging: Dg Chest 2 View  01/23/2012  *RADIOLOGY REPORT*  Clinical Data: Cough.  Shortness of breath and dizziness.  CHEST - 2 VIEW   Comparison: 05/31/2010  Findings: Cardiomegaly noted with indistinct pulmonary vasculature, airway thickening, interstitial accentuation, and blunting of the right posterior costophrenic angle.  Mild thickening of the fissures noted on the lateral projection.  IMPRESSION:  1.  Cardiomegaly with interstitial opacity and a small right pleural effusion.  Appearance favors interstitial pulmonary edema, with atypical pneumonia a less likely differential diagnostic consideration.   Original Report Authenticated By: Carron Curie, M.D.     Cardiac Studies:  ECG:  SR LVH with strain    Telemetry:  NSR no VT  Echo: pending previous EF 15-29%  Medications:     . amLODipine  10 mg Oral Daily  . aspirin  324 mg Oral Once  . carvedilol  25 mg Oral Once  . carvedilol  6.25 mg Oral BID WC  . furosemide  40 mg Intravenous Once  . furosemide  80 mg Intravenous BID  . heparin  5,000 Units Subcutaneous Q8H  . hydrALAZINE  25 mg Oral Q8H  . influenza  inactive virus vaccine  0.5 mL Intramuscular Tomorrow-1000  . isosorbide mononitrate  30 mg Oral Daily  . nitroGLYCERIN  0.5 inch Topical Once  . potassium chloride  40  mEq Oral Once  . sodium chloride  3 mL Intravenous Q12H  . DISCONTD: carvedilol  25 mg Oral BID WC  . DISCONTD: furosemide  80 mg Intravenous BID       Assessment/Plan:  HTN:  Improved continue current meds CHF:  Improved continue diuresis change to PO in am CRF:  Renal needs to see for possible vein mapping and consideration of fistula and long term F/U  Jenkins Rouge 01/24/2012, 8:27 AM

## 2012-01-25 ENCOUNTER — Inpatient Hospital Stay (HOSPITAL_COMMUNITY): Payer: Medicaid Other

## 2012-01-25 DIAGNOSIS — N184 Chronic kidney disease, stage 4 (severe): Secondary | ICD-10-CM

## 2012-01-25 DIAGNOSIS — I10 Essential (primary) hypertension: Secondary | ICD-10-CM

## 2012-01-25 LAB — BASIC METABOLIC PANEL
BUN: 35 mg/dL — ABNORMAL HIGH (ref 6–23)
Chloride: 105 mEq/L (ref 96–112)
GFR calc Af Amer: 24 mL/min — ABNORMAL LOW (ref >90)
Potassium: 3.7 mEq/L (ref 3.5–5.1)

## 2012-01-25 IMAGING — US US RENAL
1 series · 14 of 25 positions shown · non-contrast
Comparison: [DATE]

CLINICAL DATA: Abnormal renal function test.  Rule out hydro.

RENAL/URINARY TRACT ULTRASOUND COMPLETE

[Series 1: us renal · 0.31mm/px · 14 of 44 slices shown]
[im 1/44]
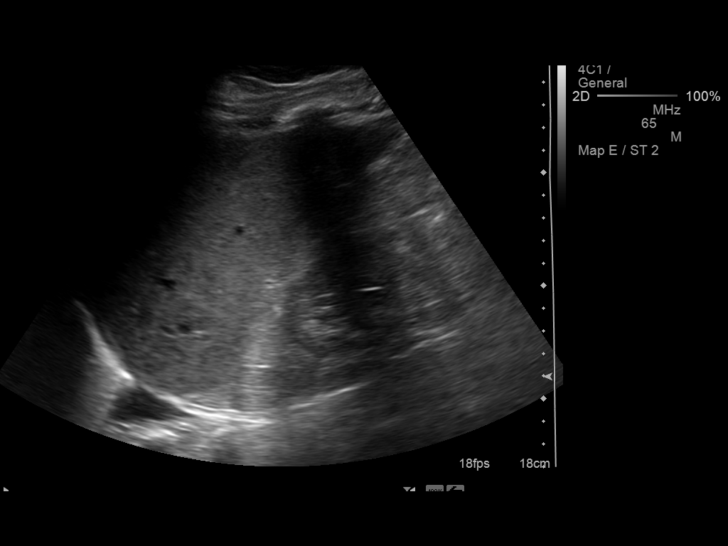
[im 4/44]
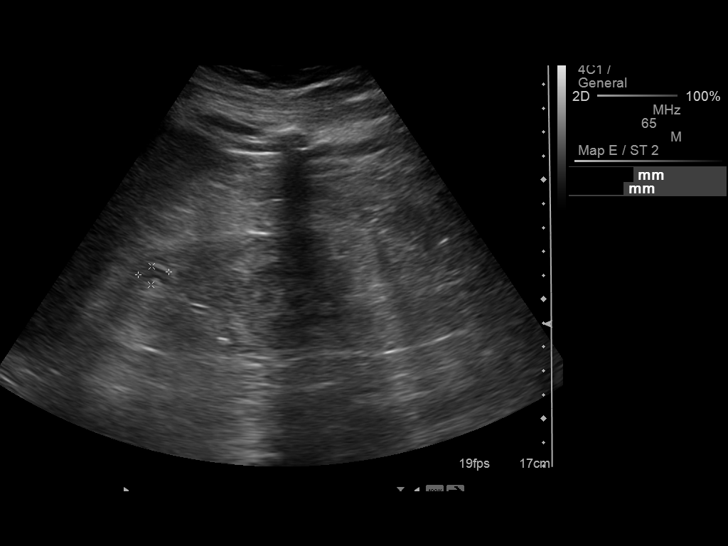
[im 8/44]
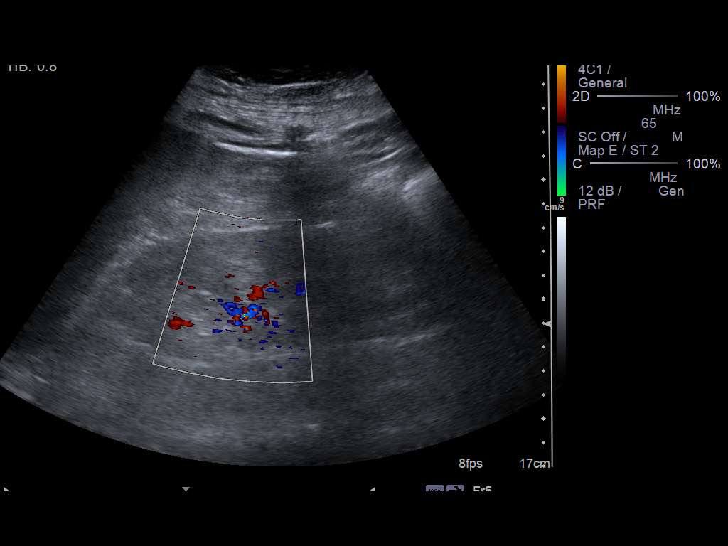
[im 11/44]
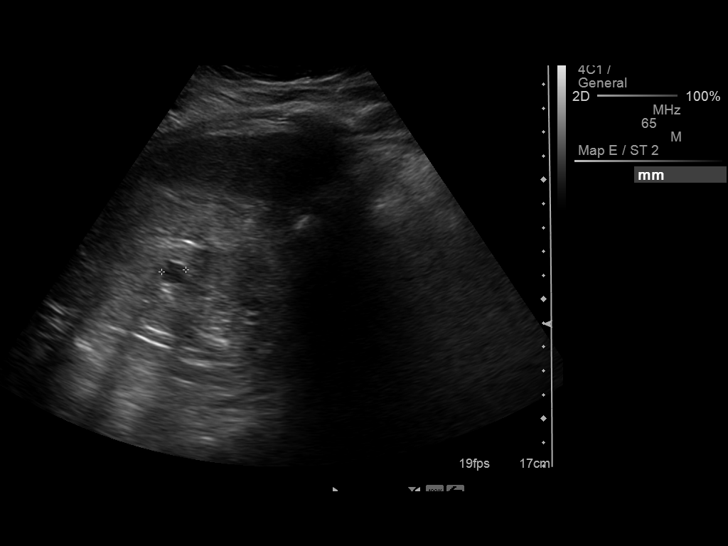
[im 15/44]
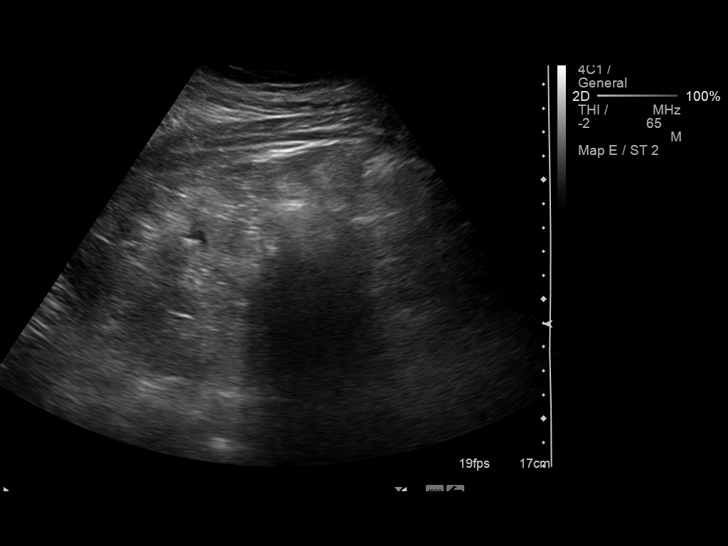
[im 17/44]
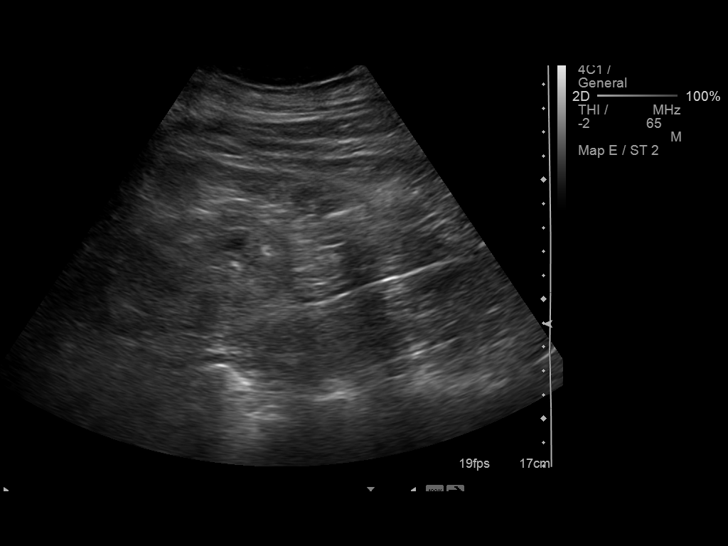
[im 20/44]
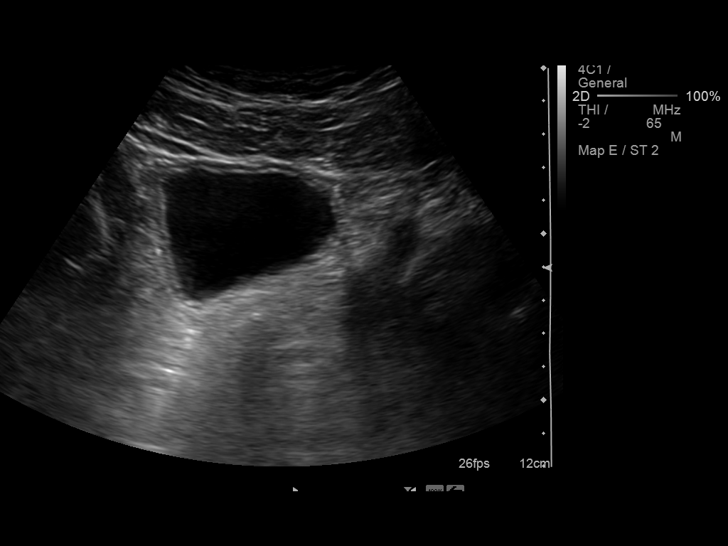
[im 24/44]
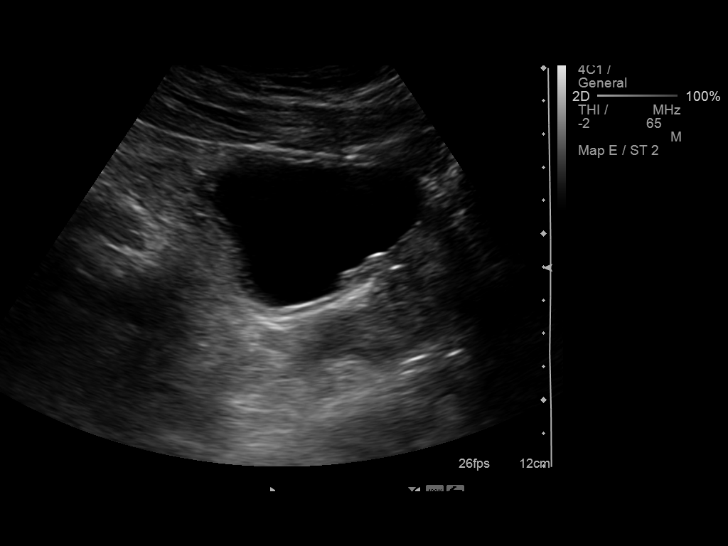
[im 27/44]
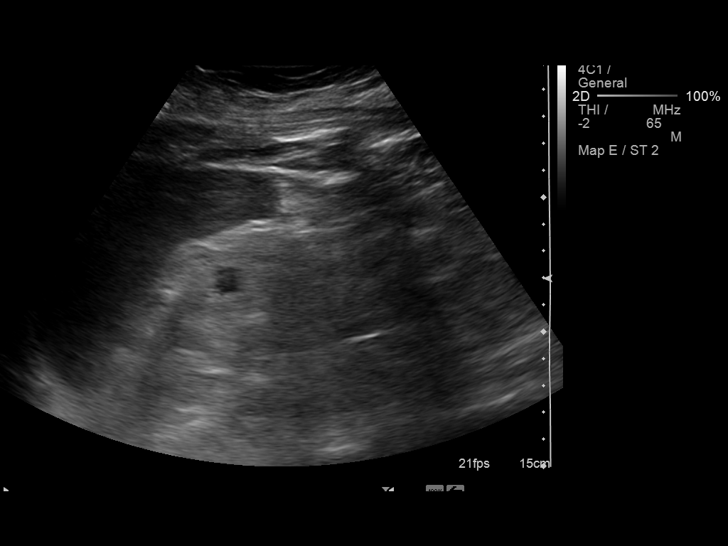
[im 29/44]
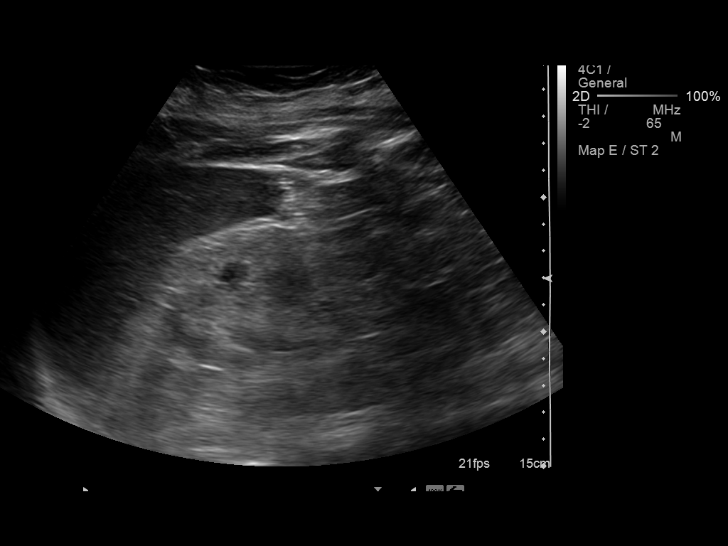
[im 33/44]
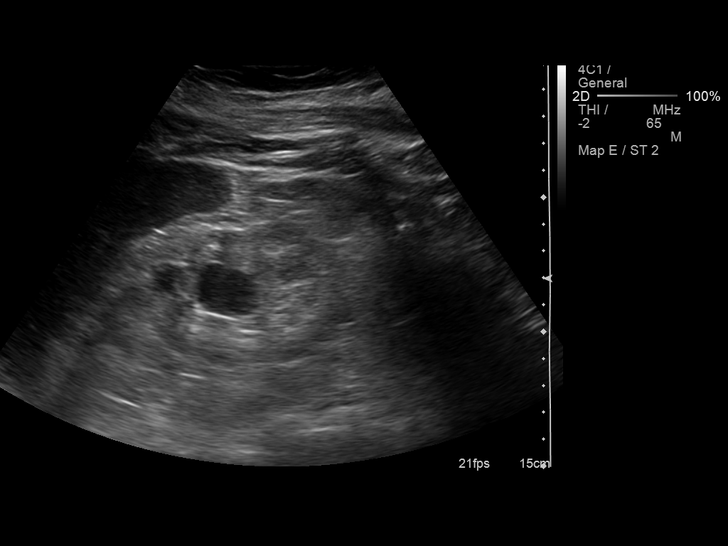
[im 36/44]
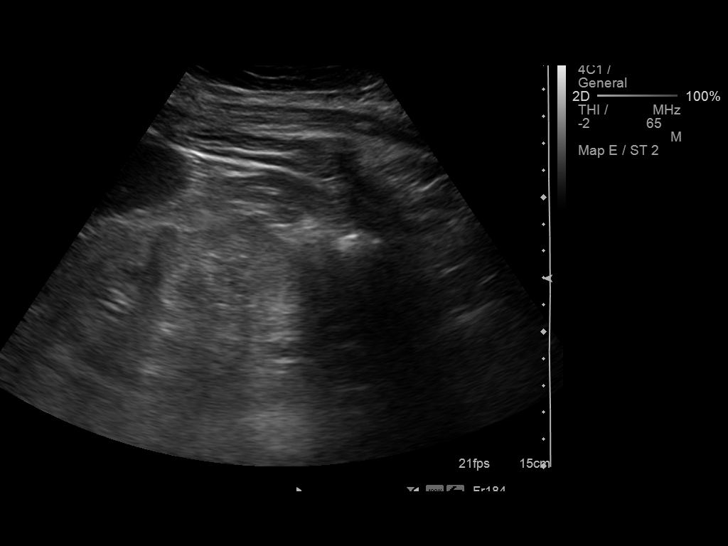
[im 40/44]
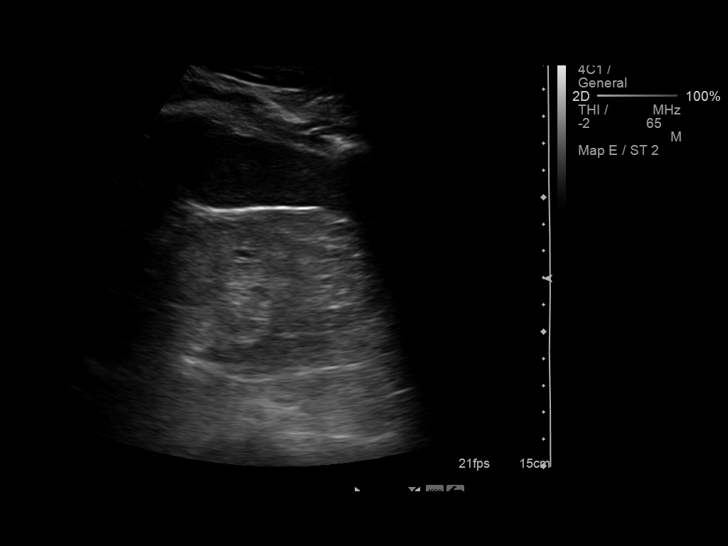
[im 44/44]
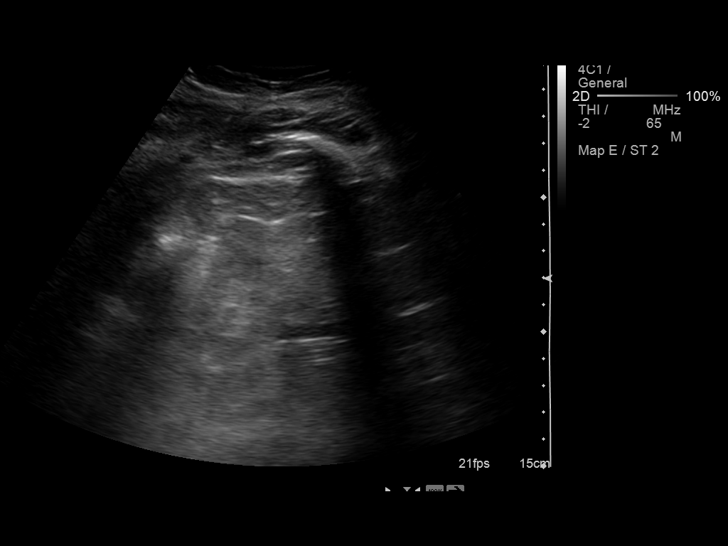

[14 of 25 positions shown; findings below may reference images not displayed]

FINDINGS: Right Kidney:  10.0 cm in length.  Echogenic renal cortex.  No
hydronephrosis.  13 mm right upper pole cyst and 9 mm right lower
pole cyst.

Left Kidney:  10.5 cm in length.  Negative for hydronephrosis.
Echogenic cortex which appears thickened bands.  12 mm left upper
pole cyst and 21 mm left mid pole cyst.

Bladder:  Thick-walled bladder without mass in the bladder

Right pleural effusion.
IMPRESSION: Echogenic cortex compatible with medical renal disease.  Negative
for hydronephrosis.

## 2012-01-25 MED ORDER — FUROSEMIDE 40 MG PO TABS
40.0000 mg | ORAL_TABLET | Freq: Two times a day (BID) | ORAL | Status: DC
  Administered 2012-01-25 – 2012-01-27 (×5): 40 mg via ORAL
  Filled 2012-01-25 (×8): qty 1

## 2012-01-25 MED ORDER — HYDRALAZINE HCL 50 MG PO TABS
50.0000 mg | ORAL_TABLET | Freq: Three times a day (TID) | ORAL | Status: DC
  Administered 2012-01-25 – 2012-01-27 (×7): 50 mg via ORAL
  Filled 2012-01-25 (×9): qty 1

## 2012-01-25 MED ORDER — LISINOPRIL 10 MG PO TABS
10.0000 mg | ORAL_TABLET | Freq: Every day | ORAL | Status: DC
  Administered 2012-01-25 – 2012-01-26 (×2): 10 mg via ORAL
  Filled 2012-01-25 (×3): qty 1

## 2012-01-25 NOTE — Progress Notes (Signed)
Patient ID: Timothy Good, male   DOB: 04-Jul-1965, 47 y.o.   MRN: [id number]    Subjective:  Denies SSCP, palpitations or Dyspnea   Objective:  Filed Vitals:   01/24/12 2346 01/25/12 0409 01/25/12 0530 01/25/12 0757  BP: 138/92 147/99  156/109  Pulse: 68 85  88  Temp: 98 F (36.7 C) 98.2 F (36.8 C)  98 F (36.7 C)  TempSrc: Oral Oral  Oral  Resp: 24 22    Height:      Weight:   226 lb 10.1 oz (102.8 kg)   SpO2: 98% 96%  98%    Intake/Output from previous day:  Intake/Output Summary (Last 24 hours) at 01/25/12 0813 Last data filed at 01/25/12 [id number]  Gross per 24 hour  Intake    818 ml  Output   2100 ml  Net  -1282 ml    Physical Exam: Affect appropriate Middle aged black male HEENT: normal Neck supple with no adenopathy JVP normal no bruits no thyromegaly Lungs clear with no wheezing and good diaphragmatic motion Heart:  S1/S2 no murmur, no rub, gallop or click PMI enlarged Abdomen: benighn, BS positve, no tenderness, no AAA no bruit.  No HSM or HJR Distal pulses intact with no bruits No edema Neuro non-focal Skin warm and dry No muscular weakness   Lab Results: Basic Metabolic Panel:  Basename 01/25/12 0435 01/24/12 0430  NA 139 143  K 3.7 3.5  CL 105 108  CO2 25 20  GLUCOSE 91 93  BUN 35* 36*  CREATININE 3.40* 3.08*  CALCIUM 8.8 9.0  MG -- --  PHOS -- --   Liver Function Tests: No results found for this basename: AST:2,ALT:2,ALKPHOS:2,BILITOT:2,PROT:2,ALBUMIN:2 in the last 72 hours No results found for this basename: LIPASE:2,AMYLASE:2 in the last 72 hours CBC:  Basename 01/23/12 1720 01/23/12 0828  WBC 8.0 7.5  NEUTROABS -- 5.2  HGB 10.5* 11.8*  HCT 32.8* 35.8*  MCV 83.9 83.3  PLT 309 304   Cardiac Enzymes:  Basename 01/23/12 0828  CKTOTAL --  CKMB --  CKMBINDEX --  TROPONINI 0.54*    Imaging: Dg Chest 2 View  01/23/2012  *RADIOLOGY REPORT*  Clinical Data: Cough.  Shortness of breath and dizziness.  CHEST - 2 VIEW   Comparison: 05/31/2010  Findings: Cardiomegaly noted with indistinct pulmonary vasculature, airway thickening, interstitial accentuation, and blunting of the right posterior costophrenic angle.  Mild thickening of the fissures noted on the lateral projection.  IMPRESSION:  1.  Cardiomegaly with interstitial opacity and a small right pleural effusion.  Appearance favors interstitial pulmonary edema, with atypical pneumonia a less likely differential diagnostic consideration.   Original Report Authenticated By: Carron Curie, M.D.     Cardiac Studies:  ECG:  SR LVH with strain    Telemetry:  NSR no VT 01/25/2012   Echo: pending previous EF 15-29%  Medications:      . amLODipine  10 mg Oral Daily  . carvedilol  6.25 mg Oral BID WC  . furosemide  80 mg Intravenous BID  . heparin  5,000 Units Subcutaneous Q8H  . hydrALAZINE  25 mg Oral Q8H  . influenza  inactive virus vaccine  0.5 mL Intramuscular Tomorrow-1000  . isosorbide mononitrate  30 mg Oral Daily  . sodium chloride  3 mL Intravenous Q12H       Assessment/Plan:  HTN:  Improved continue current meds Increase hydralazine to 50 tid CHF:  Improved continue diuresis po lasix 40 bid CRF:  Renal needs  to see for possible vein mapping and consideration of fistula and long term F/U  Jenkins Rouge 01/25/2012, 8:13 AM

## 2012-01-25 NOTE — Consult Note (Addendum)
[facility]        RENAL CONSULT   Reason for Consult: Worsening renal function in patient with prior chronic kidney disease Referring Physician: Dr. Nicholes Calamity is a 46 y.o. black man admitted on 23 October with markedly elevated blood pressure and pulmonary edema. He has been treated with amlodipine, carvedilol, furosemide, hydralazine, and nitroglycerin. He was seen by Dr. Jeneen Rinks Deterding from our office in March 2012 (when hospitalized at [facility] for poorly controlled hypertension and creatinine 2.8).  Evaluation at that time revealed normal size kidneys with no hydronephrosis, SPEP negative for monoclonal proteins, urine fractionated catecholamines normal and 3.9 G. proteinuria/24 HR. He says he has not taken any BP medicine for about a year because he was unable to afford them. He says he now has a Medicaid card and can afford medicines. Renal consult was requested by Dr. Johnsie Cancel   Past Medical History  Diagnosis Date  . Hypertension     Uncontrolled  . Chronic combined systolic and diastolic CHF (congestive heart failure)     Echo 06/2010 EF 20-25%, akinesis of the inferior myocardium, severe hypokinesis of the anteroseptal myocardial and grade 3 diastolic dysfunction  . CKD (chronic kidney disease)     stage IV   . H/O medication noncompliance   . H/O cardiovascular stress test 3.9 G. proteinuria/24 HR (March 2012)      Lexiscan Myoview 06/2010 was negative for inducible ischemia with an EF of 14%.    Family History  Problem Relation Age of Onset  . Heart failure Mother 64    "died of a weak heart from smoking" His father (age 31) has had a nephrectomy He has 2 sisters who are healthy  His brother in law is on dialysis in [place], [place]  He has a son age 71 who is healthy   . Other      No known heart disease    Social History: Born and grew up in [place], [place], he finished eighth grade, Lives with a girlfriend, he has never  married, he's never smoked smoked, he gave up drinking alcohol about one year ago. 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 Penicillins Other (See Comments)    Lost mobility in legs for a week.     Medications:   . amLODipine  10 mg Oral Daily  . carvedilol  6.25 mg Oral BID WC  . furosemide  80 mg Intravenous BID  . furosemide  40 mg Oral BID  . heparin  5,000 Units Subcutaneous Q8H  . hydrALAZINE  50 mg Oral Q8H  . isosorbide mononitrate  30 mg Oral Daily  . sodium chloride  3 mL Intravenous Q12H  . DISCONTD: hydrALAZINE  25 mg Oral Q8H    ROS- no angina, no claudication, no melena, no hematochezia, no gross hematuria, no renal colic, no decreased force of urinary stream, no nocturia, occ doe, no orthopnea, no purulent sputum, no hemoptysis, no weight gain or weight loss, no cold or heat intolerance.  Physical examination Blood pressure 138/92, pulse 80, temperature 98.1 F (36.7 C), temperature source Oral, resp. rate 22, height 6\' 2"  (1.88 m), weight 102.8 kg (226 lb 10.1 oz), SpO2 99.00%. Generally-awake, alert, cooperative, friendly Nose, mouth, pharynx-moist mucous membranes, teeth in poor repair, no inflammation or abscess Neck-not stiff Chest-clear Heart-no rub Abdomen-nontender, no bruits are  heard GU-circumcised penis, testes descended bilaterally Extremities-no arthritis, no rash, no edema, no atheroembolic changes on toes Neuro-left-handed, strength equal, sensation intact  LAB: Hemoglobin 10.5, WBC 8000, platelets 309K Sodium 139, potassium 3.7, chloride 105, CO2 25, BUN 35, CR 3.4, calcium 8.8 UA-SG 1.025, pH 6, glucose, bili, ketones, nitrite, LE negative, protein 3+, 0-2 RBCs, no WBCs CXR-(01/23/2012)-cardiomegaly, increased vascular congestion  Assessment/Plan: 1. High BP-he has had hypertension for a long time. He needs to be on a  regimen it is easy to take (once daily if possible) and not expensive. All the medicines he is taking now are available generic. He should be placed on ACE inhibitor because of his nephrotic range. I will start him on lisinopril 10 mg/D. and watch renal function and potassium while he is here in the hospital. Save right arm (he is left-handed-no IVs are needlesticks right arm) if needed for vascular access in future 2. Chronic kidney disease-4-part of the loss of renal function is from hypertension. However he had 3.9 G. proteinuria/24 HR in March 2012. 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 This can lead to loss of renal function as well. No renal biopsy is planned at this time. No immunosuppressive meds are planned at this time. He'll be started on ACE inhibitor while in the hospital (we can monitor closely creatinine and potassium to be sure they do not rise while he is on). We'll recheck protein/creatinine ratio on first void a.m. urine. Check PTH 3. CHF-EF is low. Cardiology is evaluating 4. Anemia-iron studies have been ordered    Karlisa Gaubert F 01/25/2012, 2:41 PM

## 2012-01-26 ENCOUNTER — Encounter (HOSPITAL_COMMUNITY): Payer: Self-pay | Admitting: Nephrology

## 2012-01-26 DIAGNOSIS — I5043 Acute on chronic combined systolic (congestive) and diastolic (congestive) heart failure: Secondary | ICD-10-CM

## 2012-01-26 DIAGNOSIS — R809 Proteinuria, unspecified: Secondary | ICD-10-CM | POA: Diagnosis present

## 2012-01-26 LAB — PROTEIN / CREATININE RATIO, URINE
Creatinine, Urine: 75.98 mg/dL
Protein Creatinine Ratio: 1.59 — ABNORMAL HIGH (ref 0.00–0.15)
Total Protein, Urine: 121.1 mg/dL

## 2012-01-26 LAB — BASIC METABOLIC PANEL
CO2: 25 mEq/L (ref 19–32)
Chloride: 106 mEq/L (ref 96–112)
Sodium: 141 mEq/L (ref 135–145)

## 2012-01-26 LAB — FERRITIN: Ferritin: 136 ng/mL (ref 22–322)

## 2012-01-26 MED ORDER — FERROUS FUMARATE 325 (106 FE) MG PO TABS
1.0000 | ORAL_TABLET | Freq: Every day | ORAL | Status: DC
  Administered 2012-01-26 – 2012-01-27 (×2): 106 mg via ORAL
  Filled 2012-01-26 (×2): qty 1

## 2012-01-26 NOTE — Progress Notes (Signed)
[facility]  Subjective:  Awake, alert.  Watching TV. Breathing "easy."   Objective: Vital signs in last 24 hours: Blood pressure 144/97, pulse 70, temperature 98.2 F (36.8 C), temperature source Oral, resp. rate 20, height 6\' 2"  (1.88 m), weight 101.6 kg (223 lb 15.8 oz), SpO2 100.00%.    PHYSICAL EXAM General--as above Chest--clear Heart--no rub Abd--nontender Extr--no edema  I/O yesterday--550/1200  Lab Results:   Lab 01/26/12 0435 01/25/12 0435 01/24/12 0430  NA 141 139 143  K 3.6 3.7 3.5  CL 106 105 108  CO2 25 25 20   BUN 31* 35* 36*  CREATININE 3.19* 3.40* 3.08*  ALB -- -- --  GLUCOSE 94 -- --  CALCIUM 8.8 8.8 9.0  PHOS -- -- --     Basename 01/23/12 1720  WBC 8.0  HGB 10.5*  HCT 32.8*  PLT 309   I have reviewed the patient's current medications.  Assessment/Plan: 1. High BP-ACE inhib (lisinopril 10 mg/d) started yesterday.  K and Cr did not increase.  Lisinopril will also help to decrease proteinuria.  I'd suggest increasing to 20 mg/d and d/c TID hydralazine if BP gets too low.  2. Chronic kidney disease-4- protein/creatinine ratio 1.6 this AM (had 3.9 g proteinuria on 24 hr urine last year). PTH pending (249 Mar 2012) 3. CHF-EF 50%. Cardiology is evaluating  4. Anemia--Fe/TIBC 19%, FERRITIN 93.  Should begin po Fe, check hemocult     LOS: 3 days   Timothy Good F 01/26/2012,11:06 AM   .labalb

## 2012-01-26 NOTE — Progress Notes (Signed)
Patient ID: Timothy Good, male   DOB: [birth year], 46 y.o.   MRN: [id number] Subjective:  No chest pain or sob. Wants to go home.  Objective:  Vital Signs in the last 24 hours: Temp:  [97.7 F (36.5 C)-98.2 F (36.8 C)] 98.2 F (36.8 C) (10/26 0500) Pulse Rate:  [79-83] 83  (10/26 0500) Resp:  [19-20] 20  (10/26 0500) BP: (136-147)/(86-103) 147/103 mmHg (10/26 0500) SpO2:  [96 %-100 %] 100 % (10/26 0500) Weight:  [223 lb 15.8 oz (101.6 kg)-227 lb 11.8 oz (103.3 kg)] 223 lb 15.8 oz (101.6 kg) (10/26 0500)  Intake/Output from previous day: 10/25 0701 - 10/26 0700 In: 550 [P.O.:550] Out: 1200 [Urine:1200] Intake/Output from this shift: Total I/O In: 342 [P.O.:342] Out: -   Physical Exam: Well appearing NAD HEENT: Unremarkable Neck:  No JVD, no thyromegally Lungs:  Clear with no wheezes, rales, or rhonchi HEART:  Regular rate rhythm, no murmurs, no rubs, no clicks, prominent S4. Abd:  Flat, positive bowel sounds, no organomegally, no rebound, no guarding Ext:  2 plus pulses, no edema, no cyanosis, no clubbing Skin:  No rashes no nodules Neuro:  CN II through XII intact, motor grossly intact  Lab Results:  Basename 01/23/12 1720  WBC 8.0  HGB 10.5*  PLT 309    Basename 01/26/12 0435 01/25/12 0435  NA 141 139  K 3.6 3.7  CL 106 105  CO2 25 25  GLUCOSE 94 91  BUN 31* 35*  CREATININE 3.19* 3.40*   No results found for this basename: TROPONINI:2,CK,MB:2 in the last 72 hours Hepatic Function Panel No results found for this basename: PROT,ALBUMIN,AST,ALT,ALKPHOS,BILITOT,BILIDIR,IBILI in the last 72 hours No results found for this basename: CHOL in the last 72 hours No results found for this basename: PROTIME in the last 72 hours  Imaging: US Renal  01/25/2012  *RADIOLOGY REPORT*  Clinical Data: Abnormal renal function test.  Rule out hydro.  RENAL/URINARY TRACT ULTRASOUND COMPLETE  Comparison:  05/31/2010  Findings:  Right Kidney:  10.0 cm in length.  Echogenic  renal cortex.  No hydronephrosis.  13 mm right upper pole cyst and 9 mm right lower pole cyst.  Left Kidney:  10.5 cm in length.  Negative for hydronephrosis. Echogenic cortex which appears thickened bands.  12 mm left upper pole cyst and 21 mm left mid pole cyst.  Bladder:  Thick-walled bladder without mass in the bladder  Right pleural effusion.  IMPRESSION: Echogenic cortex compatible with medical renal disease.  Negative for hydronephrosis.   Original Report Authenticated By: Truett Perna, M.D.     Cardiac Studies: Tele - nsr Assessment/Plan:  1. Acute diastolic chf 2. Acute on chronic renal failure, stage 4 3. Malignant HTN Rec: continue current meds. Appreciate Dr. Cherlyn Cushing note and rec's. He is tolerating lisinopril nicely. Will discharge tomorrow if creatinine stable.    LOS: 3 days    Gregg Taylor,M.D. 01/26/2012, 9:11 AM

## 2012-01-27 ENCOUNTER — Encounter (HOSPITAL_COMMUNITY): Payer: Self-pay | Admitting: Cardiology

## 2012-01-27 LAB — BASIC METABOLIC PANEL
BUN: 30 mg/dL — ABNORMAL HIGH (ref 6–23)
Chloride: 104 mEq/L (ref 96–112)
Glucose, Bld: 114 mg/dL — ABNORMAL HIGH (ref 70–99)
Potassium: 3.6 mEq/L (ref 3.5–5.1)

## 2012-01-27 MED ORDER — CARVEDILOL 6.25 MG PO TABS: 6.2500 mg | ORAL_TABLET | Freq: Two times a day (BID) | ORAL | Status: AC

## 2012-01-27 MED ORDER — ISOSORBIDE MONONITRATE ER 30 MG PO TB24: 30.0000 mg | ORAL_TABLET | Freq: Every day | ORAL | Status: DC

## 2012-01-27 MED ORDER — FERROUS FUMARATE 325 (106 FE) MG PO TABS: 1.0000 | ORAL_TABLET | Freq: Every day | ORAL | Status: AC

## 2012-01-27 MED ORDER — AMLODIPINE BESYLATE 10 MG PO TABS: 10.0000 mg | ORAL_TABLET | Freq: Every day | ORAL | Status: AC

## 2012-01-27 MED ORDER — LISINOPRIL 20 MG PO TABS: 20.0000 mg | ORAL_TABLET | Freq: Every day | ORAL | Status: AC

## 2012-01-27 MED ORDER — LISINOPRIL 20 MG PO TABS
20.0000 mg | ORAL_TABLET | Freq: Every day | ORAL | Status: DC
  Administered 2012-01-27: 20 mg via ORAL
  Filled 2012-01-27: qty 1

## 2012-01-27 MED ORDER — FUROSEMIDE 40 MG PO TABS: 40.0000 mg | ORAL_TABLET | Freq: Two times a day (BID) | ORAL | Status: AC

## 2012-01-27 MED ORDER — HYDRALAZINE HCL 50 MG PO TABS: 50.0000 mg | ORAL_TABLET | Freq: Three times a day (TID) | ORAL | Status: AC

## 2012-01-27 NOTE — ED Provider Notes (Signed)
Medical screening examination/treatment/procedure(s) were performed by non-physician practitioner and as supervising physician I was immediately available for consultation/collaboration.   Alfonzo Feller, DO 01/27/12 1628

## 2012-01-27 NOTE — Progress Notes (Signed)
Patient ID: Timothy Good, male   DOB: April 23, 1965, 47 y.o.   MRN: [id number]  Subjective:  No chest pain or sob. Wants to go home.  Objective:  Vital Signs in the last 24 hours: Temp:  [97.9 F (36.6 C)-98.5 F (36.9 C)] 98.1 F (36.7 C) (10/27 0817) Pulse Rate:  [66-83] 83  (10/27 0817) Resp:  [18-20] 20  (10/27 0817) BP: (125-168)/(57-99) 144/78 mmHg (10/27 0817) SpO2:  [96 %-100 %] 99 % (10/27 0817) Weight:  [223 lb 5.2 oz (101.3 kg)] 223 lb 5.2 oz (101.3 kg) (10/27 0543)  Intake/Output from previous day: 10/26 0701 - 10/27 0700 In: 922 [P.O.:922] Out: 2700 [Urine:2700] Intake/Output from this shift:    Physical Exam: Well appearing NAD HEENT: Unremarkable Neck:  No JVD, no thyromegally Lungs:  Clear with no wheezes, rales, or rhonchi HEART:  Regular rate rhythm, no murmurs, no rubs, no clicks, prominent S4. Abd:  Flat, positive bowel sounds, no organomegally, no rebound, no guarding Ext:  2 plus pulses, no edema, no cyanosis, no clubbing Skin:  No rashes no nodules Neuro:  CN II through XII intact, motor grossly intact  Lab Results: No results found for this basename: WBC:2,HGB:2,PLT:2 in the last 72 hours  Basename 01/27/12 0500 01/26/12 0435  NA 140 141  K 3.6 3.6  CL 104 106  CO2 24 25  GLUCOSE 114* 94  BUN 30* 31*  CREATININE 3.24* 3.19*   No results found for this basename: TROPONINI:2,CK,MB:2 in the last 72 hours Hepatic Function Panel No results found for this basename: PROT,ALBUMIN,AST,ALT,ALKPHOS,BILITOT,BILIDIR,IBILI in the last 72 hours No results found for this basename: CHOL in the last 72 hours No results found for this basename: PROTIME in the last 72 hours  Imaging: US Renal  01/25/2012  *RADIOLOGY REPORT*  Clinical Data: Abnormal renal function test.  Rule out hydro.  RENAL/URINARY TRACT ULTRASOUND COMPLETE  Comparison:  05/31/2010  Findings:  Right Kidney:  10.0 cm in length.  Echogenic renal cortex.  No hydronephrosis.  13 mm right upper  pole cyst and 9 mm right lower pole cyst.  Left Kidney:  10.5 cm in length.  Negative for hydronephrosis. Echogenic cortex which appears thickened bands.  12 mm left upper pole cyst and 21 mm left mid pole cyst.  Bladder:  Thick-walled bladder without mass in the bladder  Right pleural effusion.  IMPRESSION: Echogenic cortex compatible with medical renal disease.  Negative for hydronephrosis.   Original Report Authenticated By: Truett Perna, M.D.     Cardiac Studies: Tele - nsr Assessment/Plan:  1. Acute diastolic chf 2. Acute on chronic renal failure, stage 4 3. Malignant HTN Rec: continue current meds. Will discharge today as creatinine stable. Will discharge on lisinopril 20 mg daily along with all other CHF meds. He will need BMP in 2-3 days.     LOS: 4 days    Gregg Taylor,M.D. 01/27/2012, 8:46 AM

## 2012-01-27 NOTE — Discharge Summary (Signed)
Discharge Summary   Patient ID: Timothy Good MRN: [id number], DOB/AGE: 1965/10/27 46 y.o.  Primary Cardiologist: Dr. Verl Blalock Admit date: 01/23/2012 D/C date:     01/27/2012     Primary Discharge Diagnoses:  1. Acute on Chronic Combined Diastolic/Systolic CHF  - In the setting of hypertensive crisis  - Echo showed mod dilated LV, mod LVH, EF Q000111Q, grade 2 diastolic dysfunction, mod LAE, mild RAE.  - Dc'd on Hydralazine, Imdur, Coreg, Norvasc, Lisinopril, Lasix   - Weight 228--> 223lbs  2. Acute on Chronic Renal Insufficiency (Stage IV)  - Crt improving, 3.24 at dc   - F/u BMET   3. Hypertensive Crisis  - Due to medication noncompliance  - Meds as above  4. Iron Deficiency Anemia  - Initiated on Fe Fumarate  5. Medication Noncompliance   - Due to inability to afford medications  - Recently obtained medicaid  Secondary Discharge Diagnoses:  . Chronic combined systolic and diastolic CHF (congestive heart failure)     Echo 06/2010 EF 20-25%, akinesis of the inferior myocardium, severe hypokinesis of the anteroseptal myocardial and grade 3 diastolic dysfunction; Echo 01/2012 mod dilated LV, mod LVH, EF Q000111Q, grade 2 diastolic dysfunction, mod LAE, mild RAE   . H/O cardiovascular stress test     Lexiscan Myoview 06/2010 was negative for inducible ischemia with an EF of 14%.  . Protein in urine     3.9 gm Mar 2012; 1.6gm 01/2012    Allergies Allergies  Allergen Reactions  . Penicillins Other (See Comments)    Lost mobility in legs for a week.     Diagnostic Studies/Procedures:   01/25/2012 - RENAL/URINARY TRACT ULTRASOUND COMPLETE   Findings:  Right Kidney:  10.0 cm in length.  Echogenic renal cortex.  No hydronephrosis.  13 mm right upper pole cyst and 9 mm right lower pole cyst.  Left Kidney:  10.5 cm in length.  Negative for hydronephrosis. Echogenic cortex which appears thickened bands.  12 mm left upper pole cyst and 21 mm left mid pole cyst.  Bladder:   Thick-walled bladder without mass in the bladder  Right pleural effusion.  IMPRESSION: Echogenic cortex compatible with medical renal disease.  Negative for hydronephrosis.      01/24/12 - Echo Study Conclusions: - Left ventricle: The cavity size was moderately dilated. Wall thickness was increased in a pattern of moderate LVH. Systolic function was mildly reduced. The estimated ejection fraction was in the range of 45% to 50%. Features are consistent with a pseudonormal left ventricular filling pattern, with concomitant abnormal relaxation and increased filling pressure (grade 2 diastolic dysfunction). - Aortic valve: Trivial regurgitation. - Left atrium: The atrium was moderately dilated. - Right atrium: The atrium was mildly dilated.   History of Present Illness: 46 y.o. male w/ the above medical problems who transferred from [facility] to [facility] on 01/23/2012 with complaints of dizziness and found to have markedly elevated BP, elevated troponin, and pulmonary edema.  He was evaluated in consultation by Dr. Verl Blalock in February 2012 for newly diagnosed CHF and uncontrolled HTN. At that time echo showed mod-severe LVH, EF 20-25% with akinesis of the inferior myocardium, severe hypokinesis of the anteroseptal myocardial and grade 3 diastolic dysfunction. EKG showed lateral TW changes. There was concern for ischemic heart disease, but given his renal insufficiency he was not a good candidate for cath and therefore had a stress myoview. Lexiscan Myoview was negative for inducible ischemia with an EF of 14%. It  was felt his cardiomyopathy was more likely from uncontrolled HTN. Renal US at that time showed no hydronephrosis. He was discharged on ASA, Lasix, Coreg, Norvasc, and Hydralazine.  He reports he was unable to get those medications filled or get regular medical care due to lack of insurance. About 3 months ago he went to the [facility] ED due to lightheadedness and was told his BP  was ok but his potassium was low so this was supplemented and he was discharged home. He was feeling well until the day prior to presentation when he felt lightheaded and short of breath which he thought was due to having high blood pressure prompting him to present to the [facility] ED.  Hospital Course: At [facility], EKG showed NSR 84bpm, LVH, lateral TWI unchanged from prior EKG. CXR showed cardiomegaly with interstitial opacity and small right pleural effusion favoring interstitial pulmonary edema. Labs were significant for troponin 0.54, pBNP 6339, K+ 3.2, BUN/Crt 34/3.07, Hgb 11.8. BP initially 227/150 with rates 110s. He received NTG paste, 324mg  ASA, 40mg  IV Lasix, 25mg  Coreg and was transferred to [facility] stepdown unit. Upon arrival to [facility] his BP remained markedly elevated and he was mildly volume overloaded. He was initiated on Hydralazine, Coreg, Norvasc, and Imdur with improvement in BP. He was gently diuresed with IV lasix with close monitoring of renal function and electrolytes. Crt increased and lasix was changed to oral dosing with improvement in Crt. He diuresed a total of 4.4L with weight 228--> 223lbs. Echo showed mod dilated LV, mod LVH, EF Q000111Q, grade 2 diastolic dysfunction, mod LAE, mild RAE. His mild elevation of troponin was felt related to his heart failure as he had no anginal symptoms, EKG changes, or WMAs.  Nephrology evaluated him and noted that part of his loss of renal function was from hypertension, but his degree of proteinuria in 2012 suggested a glomerular diseae as well. Recommendations were made for initiation of an ACEI, repeat renal US, check PTH, and recheck protein/creatinine ratio. Renal US was without acute findings. PTH is pending and will need to be followed up. His protein/creatinine ratio showed improvement from 2012 (1.6). His Lisinopril was increased to 20mg  daily with recommendations to decrease or discontinue hydralazine if BP doesn't tolerate the  ACEI. He will need outpatient follow up with Nephrology (previously seen by Dr. Jeneen Rinks Deterding). He was noted to be anemic for which an anemia panel was obtained revealing low Fe (consistent with anemia panel from 2012). There were no signs of active bleeding. He was discharged on Fe supplementation with plans to follow up with his primary care provider.   He was seen and evaluated by Dr. Lovena Le who felt he was stable for discharge home with plans for follow up as scheduled below.  Discharge Vitals: Blood pressure 144/78, pulse 83, temperature 98.1 F (36.7 C), temperature source Oral, resp. rate 20, height 6\' 2"  (1.88 m), weight 223 lb 5.2 oz (101.3 kg), SpO2 99.00%.  Labs: Component Value Date   WBC 8.0 01/23/2012   HGB 10.5* 01/23/2012   HCT 32.8* 01/23/2012   MCV 83.9 01/23/2012   PLT 309 01/23/2012     01/26/2012 04:35  Iron 39 (L)  UIBC 193  TIBC 232  Saturation Ratios 17 (L)  Ferritin 136   Lab 01/27/12 0500  NA 140  K 3.6  CL 104  CO2 24  BUN 30*  CREATININE 3.24*  CALCIUM 9.0  GLUCOSE 114*     01/23/2012 08:45  Pro  B Natriuretic peptide (BNP) 6339.0 (H)     01/23/2012 08:28  Troponin I 0.54 (HH)     01/24/2012 04:30  TSH 0.930    Discharge Medications     Medication List     As of 01/27/2012 12:51 PM    TAKE these medications         amLODipine 10 MG tablet   Commonly known as: NORVASC   Take 1 tablet (10 mg total) by mouth daily.      carvedilol 6.25 MG tablet   Commonly known as: COREG   Take 1 tablet (6.25 mg total) by mouth 2 (two) times daily with a meal.      ferrous fumarate 325 (106 FE) MG Tabs   Commonly known as: HEMOCYTE - 106 mg FE   Take 1 tablet (106 mg of iron total) by mouth daily.      furosemide 40 MG tablet   Commonly known as: LASIX   Take 1 tablet (40 mg total) by mouth 2 (two) times daily.      hydrALAZINE 50 MG tablet   Commonly known as: APRESOLINE   Take 1 tablet (50 mg total) by mouth every 8 (eight) hours.       isosorbide mononitrate 30 MG 24 hr tablet   Commonly known as: IMDUR   Take 1 tablet (30 mg total) by mouth daily.      lisinopril 20 MG tablet   Commonly known as: PRINIVIL,ZESTRIL   Take 1 tablet (20 mg total) by mouth daily.          Disposition   Discharge Orders    Future Orders Please Complete By Expires   Diet - low sodium heart healthy      Increase activity slowly      Discharge instructions      Comments:   * Please take all medications as prescribed and bring them with you to your office visits. * Please establish care with a primary care provider as soon as possible.  * Please obtain a blood pressure cuff. Keep a log of your blood pressure and bring it with you to your next office visit.  * Your blood counts and iron were found to be low. You were started on iron supplementation. Please monitor for bleeding and follow up with your primary care provider.     Follow-up Information    Follow up with [facility]. On 01/30/2012. (Lab work Artist); Our office will call you with your appointment time)    Contact information:   [address] [place] [address] [phone]      Follow up with  [facility] . In 1 week. (Our office will call you with your appointment time)    Contact information:   [address] [place] [address] [phone]      Schedule an appointment as soon as possible for a visit with Primary Care Provider.      Schedule an appointment as soon as possible for a visit with DETERDING,JAMES L, MD.   Contact information:   [place] [address] [phone]           Outstanding Labs/Studies:  1. BMET 2. F/u PTH  Duration of Discharge Encounter: Greater than 30 minutes including physician and PA time.  Signed, Melika Reder PA-C 01/27/2012, 12:51 PM

## 2012-01-28 ENCOUNTER — Other Ambulatory Visit: Payer: Self-pay | Admitting: *Deleted

## 2012-01-28 DIAGNOSIS — I509 Heart failure, unspecified: Secondary | ICD-10-CM

## 2012-02-06 ENCOUNTER — Ambulatory Visit: Payer: Medicaid Other | Admitting: Physician Assistant

## 2012-10-17 ENCOUNTER — Other Ambulatory Visit (HOSPITAL_COMMUNITY): Payer: Self-pay | Admitting: Nephrology

## 2012-10-17 DIAGNOSIS — N289 Disorder of kidney and ureter, unspecified: Secondary | ICD-10-CM

## 2012-10-28 ENCOUNTER — Other Ambulatory Visit: Payer: Self-pay | Admitting: Family Medicine

## 2012-10-29 ENCOUNTER — Ambulatory Visit (HOSPITAL_COMMUNITY): Payer: Medicaid Other

## 2013-08-12 ENCOUNTER — Other Ambulatory Visit (HOSPITAL_COMMUNITY): Payer: Self-pay | Admitting: Nephrology

## 2013-08-12 DIAGNOSIS — N289 Disorder of kidney and ureter, unspecified: Secondary | ICD-10-CM

## 2013-09-30 ENCOUNTER — Ambulatory Visit (HOSPITAL_COMMUNITY): Payer: Medicaid Other

## 2013-10-05 ENCOUNTER — Ambulatory Visit (HOSPITAL_COMMUNITY)
Admission: RE | Admit: 2013-10-05 | Discharge: 2013-10-05 | Disposition: A | Discharge status: Home / Self Care | Payer: Medicaid Other | Source: Ambulatory Visit | Attending: Nephrology | Admitting: Nephrology

## 2013-10-05 DIAGNOSIS — Q619 Cystic kidney disease, unspecified: Secondary | ICD-10-CM | POA: Diagnosis not present

## 2013-10-05 DIAGNOSIS — N289 Disorder of kidney and ureter, unspecified: Secondary | ICD-10-CM | POA: Insufficient documentation

## 2013-10-05 IMAGING — US US RENAL
1 series · 13 of 25 positions shown · non-contrast
Comparison: [DATE]

CLINICAL DATA: Renal insufficiency

EXAM:
RETROPERITONEAL ULTRASOUND

[Series 1: us renal · 0.24mm/px · 13 of 48 slices shown]
[im 1/48]
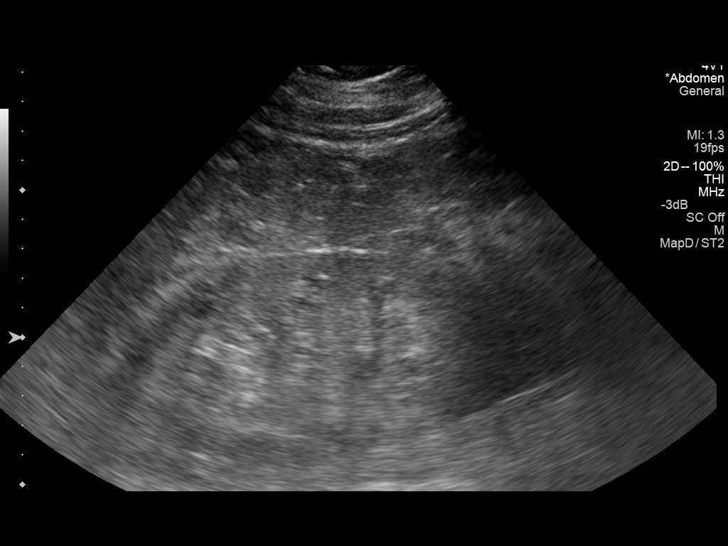
[im 4/48]
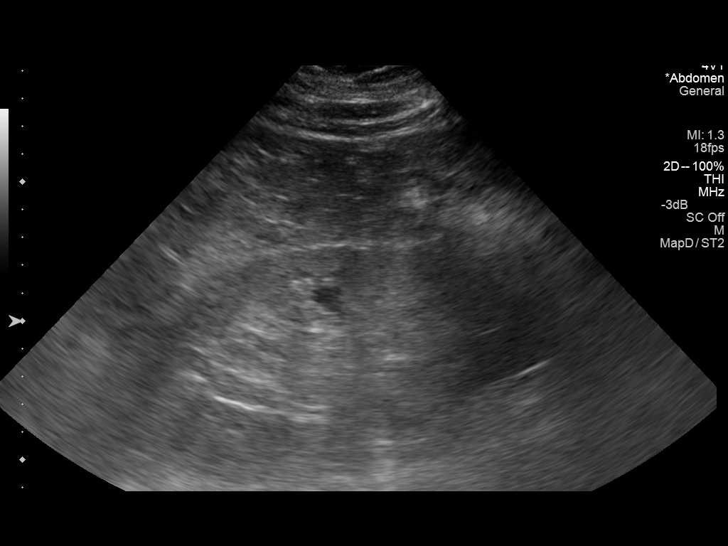
[im 8/48]
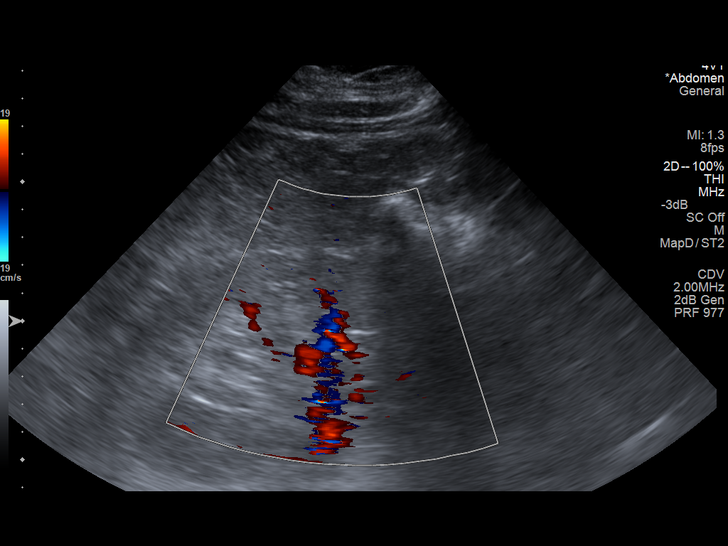
[im 12/48]
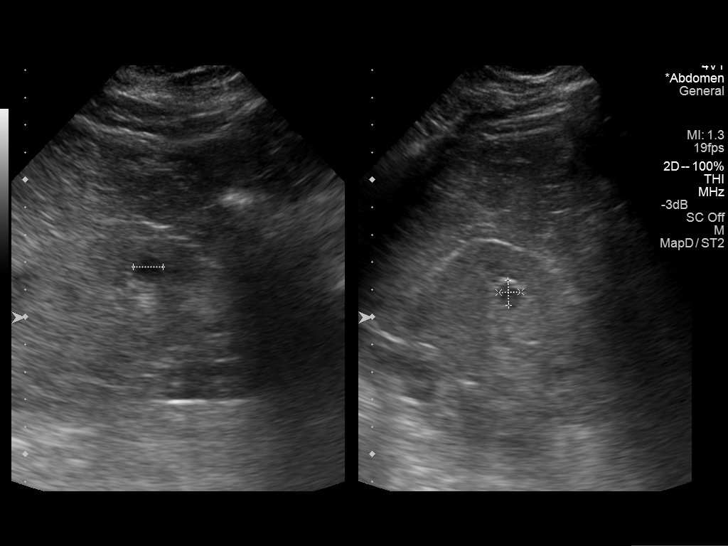
[im 16/48]
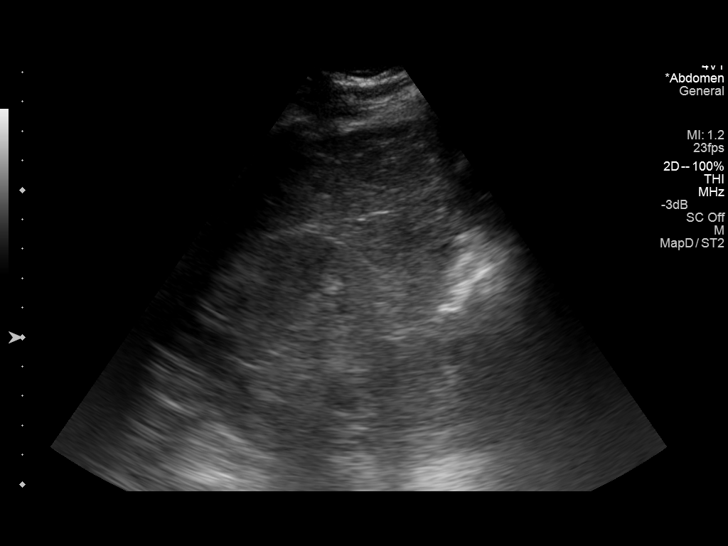
[im 20/48]
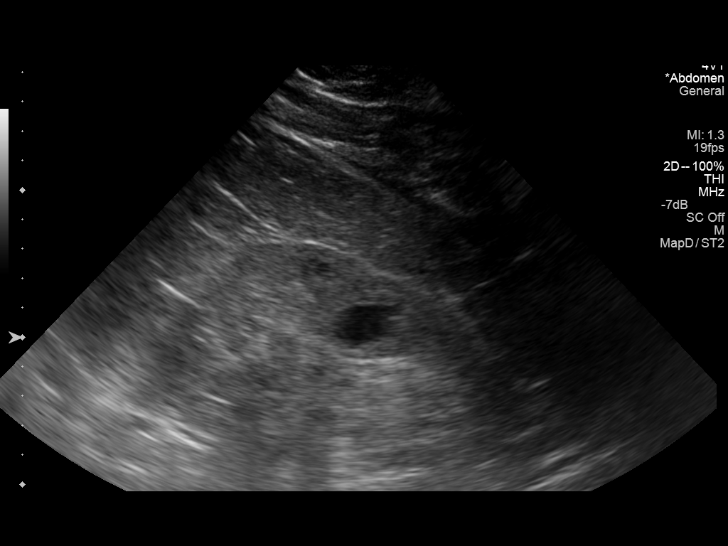
[im 24/48]
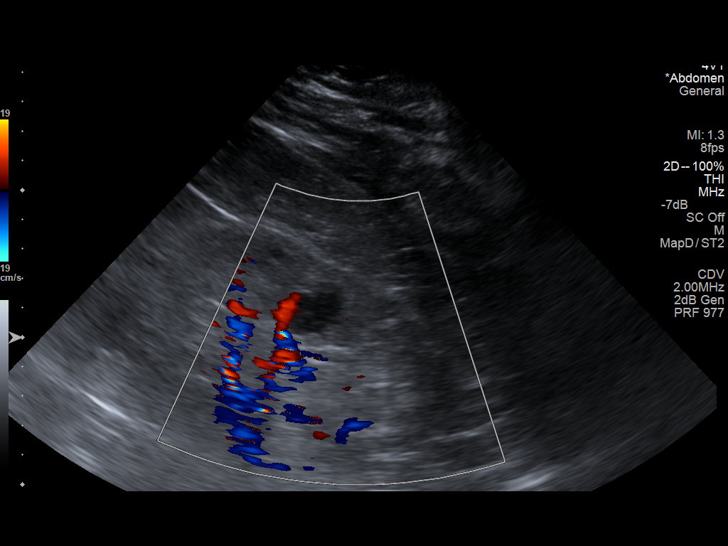
[im 28/48]
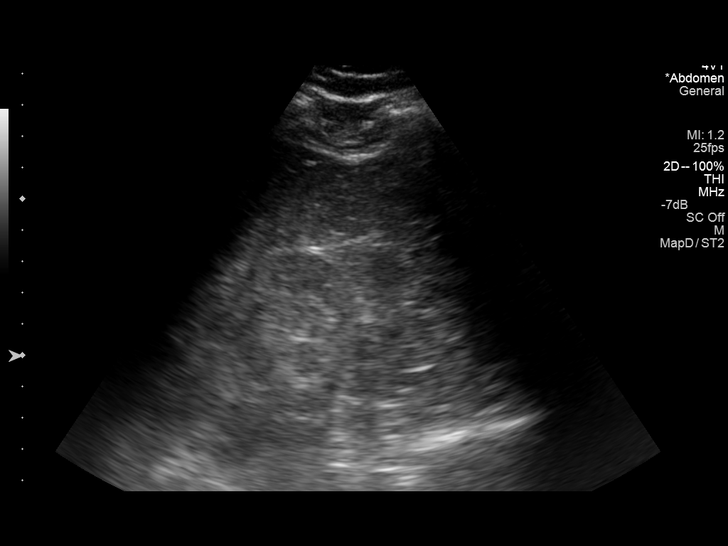
[im 32/48]
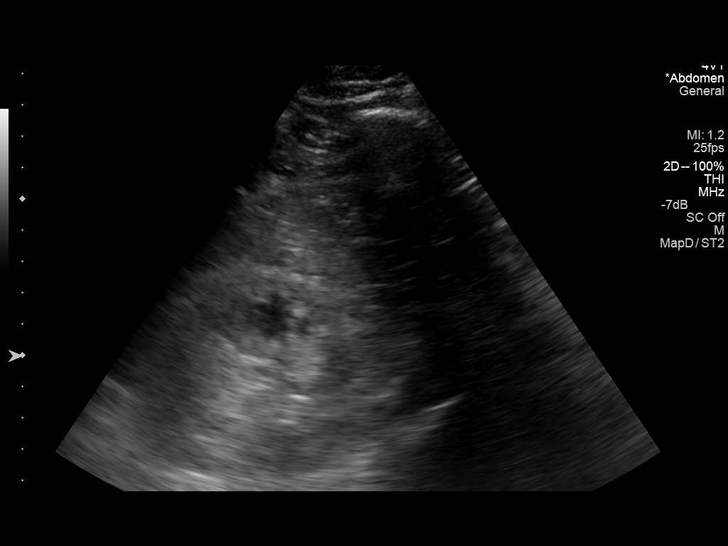
[im 36/48]
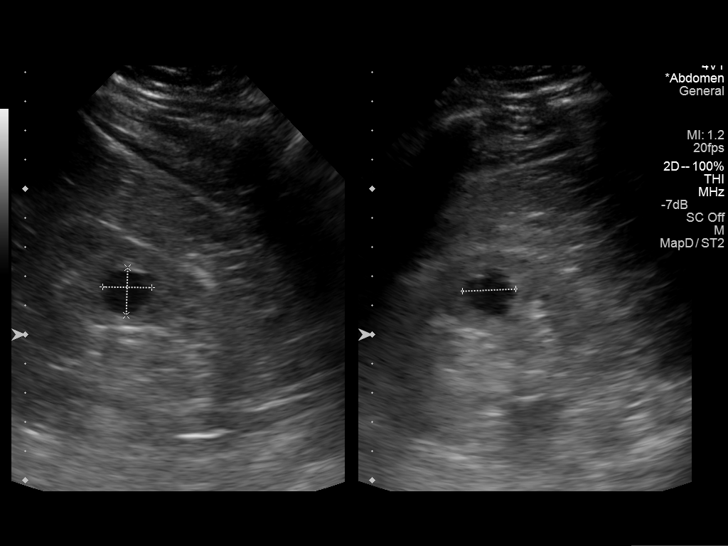
[im 40/48]
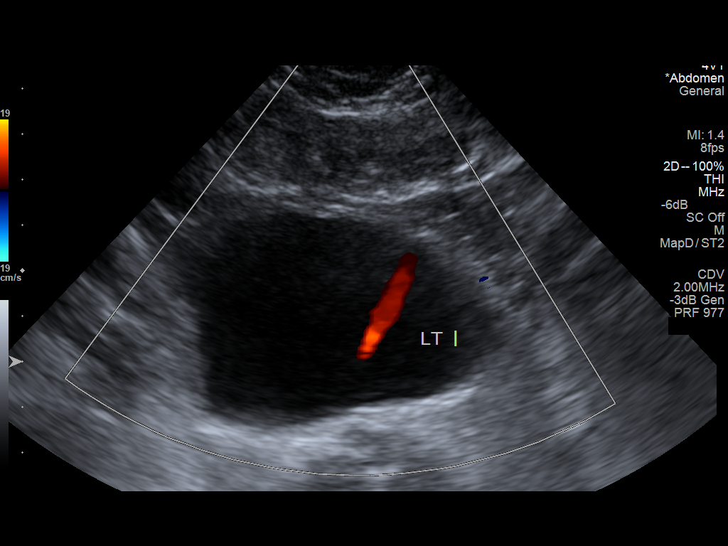
[im 44/48]
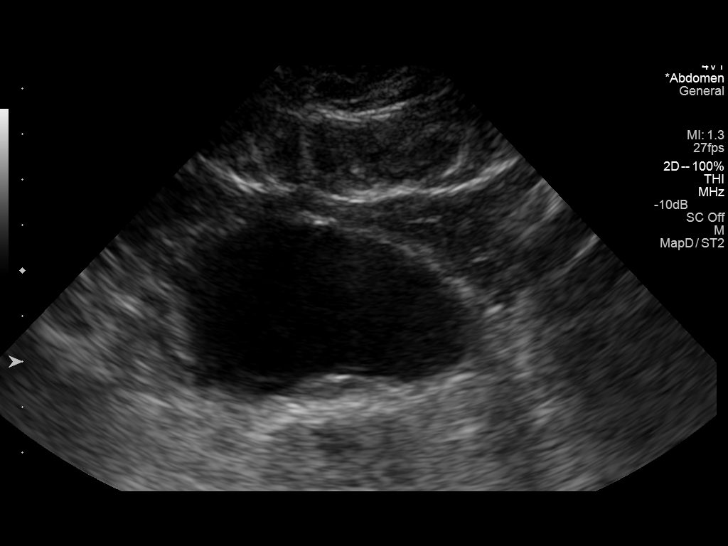
[im 48/48]
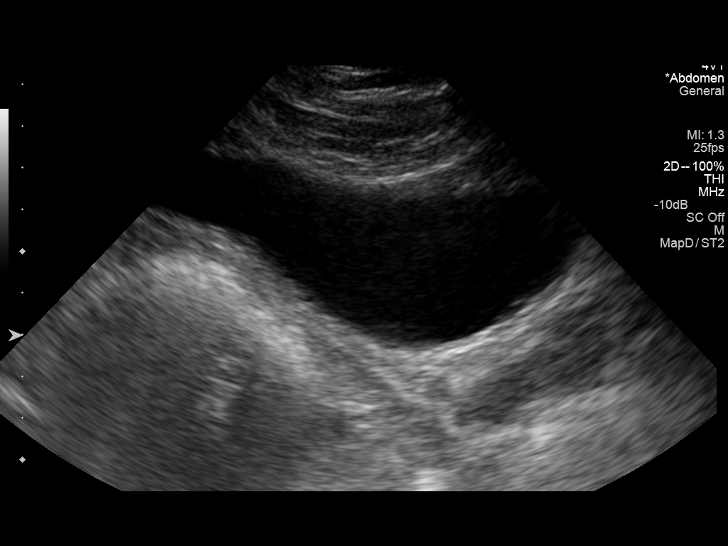

[13 of 25 positions shown; findings below may reference images not displayed]

FINDINGS: Right Kidney:

Length: 11.1 cm. There is increased echogenicity in the right
kidney. The right renal cortex is mildly thinned. There is no
pelvicaliectasis or perinephric fluid on the right. There is no a
0.9 x 0.8 x 1.1 cm cyst in the lower pole of the right kidney.
Several smaller subcentimeter cysts are noted in the right kidney is
well. No noncystic renal masses are appreciated on the right. There
is no sonographically demonstrable calculus or ureterectasis on the
right.

Left Kidney:

Length: 11.1 cm. Left renal echogenicity is increased. The left
renal cortical thickness is within normal limits. There is a cyst in
the mid left kidney measuring 1.7 x 1.6 x 1.8 cm. No noncystic renal
masses are identified on the left. There is no sonographically
demonstrable calculus or ureterectasis on the left.

Bladder:

Appears normal for degree of bladder distention.
IMPRESSION: Both kidneys show increased echogenicity consistent with medical
renal disease. There is borderline renal cortical thinning on the
right, a finding also associated with medical renal disease. There
are renal cysts, a finding that may be seen with chronic renal
disease. No noncystic renal masses are identified. No obstructing
foci are identified in either kidney.

## 2015-06-08 ENCOUNTER — Encounter: Payer: Self-pay | Admitting: Cardiovascular Disease

## 2015-06-08 ENCOUNTER — Ambulatory Visit (INDEPENDENT_AMBULATORY_CARE_PROVIDER_SITE_OTHER): Payer: Medicaid Other | Admitting: Cardiovascular Disease

## 2015-06-08 VITALS — BP 134/82 | HR 99 | Ht 74.0 in | Wt 223.0 lb

## 2015-06-08 DIAGNOSIS — I10 Essential (primary) hypertension: Secondary | ICD-10-CM

## 2015-06-08 DIAGNOSIS — E785 Hyperlipidemia, unspecified: Secondary | ICD-10-CM | POA: Diagnosis not present

## 2015-06-08 DIAGNOSIS — N184 Chronic kidney disease, stage 4 (severe): Secondary | ICD-10-CM

## 2015-06-08 DIAGNOSIS — R079 Chest pain, unspecified: Secondary | ICD-10-CM

## 2015-06-08 DIAGNOSIS — I5042 Chronic combined systolic (congestive) and diastolic (congestive) heart failure: Secondary | ICD-10-CM

## 2015-06-08 MED ORDER — ISOSORBIDE MONONITRATE ER 60 MG PO TB24: 60.0000 mg | ORAL_TABLET | Freq: Every day | ORAL | Status: AC

## 2015-06-08 NOTE — Patient Instructions (Signed)
Medication Instructions:  INCREASE IMDUR TO 60 MG DAILY  Labwork: NONE  Testing/Procedures: Your physician has requested that you have an echocardiogram. Echocardiography is a painless test that uses sound waves to create images of your heart. It provides your doctor with information about the size and shape of your heart and how well your heart's chambers and valves are working. This procedure takes approximately one hour. There are no restrictions for this procedure.  Your physician has requested that you have a lexiscan myoview. For further information please visit [email or web address]. Please follow instruction sheet, as given.    Follow-Up: Your physician recommends that you schedule a follow-up appointment in: [place]   Any Other Special Instructions Will Be Listed Below (If Applicable).     If you need a refill on your cardiac medications before your next appointment, please call your pharmacy.

## 2015-06-08 NOTE — Progress Notes (Signed)
Patient ID: Timothy Good, male   DOB: 24-May-1965, 50 y.o.   MRN: [id number]       CARDIOLOGY CONSULT NOTE  Patient ID: Timothy Good MRN: [id number] DOB/AGE: 01-03-1966 50 y.o.  Admit date: (Not on file) Primary Physician No PCP Per Patient  Reason for Consultation: CHF  HPI: The patient is a 50 year old man with a past medical history significant for malignant hypertension, chronic combined systolic and diastolic heart failure, and CKD stage IV. He also has hyperlipidemia. Labs performed on 08/12/14 showed total cholesterol 234, this was 104, HDL 67, LDL 140, BUN 28, creatinine 2.3, sodium 142, potassium 4.5, hemoglobin 12.4, platelets 267.  He has been experiencing chest tightness and pressure when walking 2 or 3 blocks and when playing with his grandchildren, alleviated with rest. Denies significant exertional dyspnea, orthopnea, or paroxysmal nocturnal dyspnea. He occasionally has lightheadedness and dizziness but denies syncope. He also denies leg swelling.  The most recent echocardiogram I find is dated 01/24/12 which demonstrated mildly reduced left ventricular systolic function, EF Q000111Q, moderate LVH, and grade 2 diastolic dysfunction.  The most recent nuclear stress test I find is dated 06/03/10 which was negative for inducible ischemia with left ventricular dilatation and global hypokinesis and no fixed defects, LVEF 14%.  ECG performed in the office today demonstrates normal sinus rhythm with frequent PVCs and a diffuse nonspecific T wave abnormality.   Allergies  Allergen Reactions  . Penicillins Other (See Comments)    Lost mobility in legs for a week.     Current Outpatient Prescriptions  Medication Sig Dispense Refill  . amLODipine (NORVASC) 10 MG tablet Take 1 tablet (10 mg total) by mouth daily. 30 tablet 6  . carvedilol (COREG) 6.25 MG tablet Take 1 tablet (6.25 mg total) by mouth 2 (two) times daily with a meal. 60 tablet 6  . ferrous fumarate (HEMOCYTE - 106  MG FE) 325 (106 FE) MG TABS Take 1 tablet (106 mg of iron total) by mouth daily. 30 each 3  . furosemide (LASIX) 40 MG tablet Take 1 tablet (40 mg total) by mouth 2 (two) times daily. 60 tablet 6  . hydrALAZINE (APRESOLINE) 50 MG tablet Take 1 tablet (50 mg total) by mouth every 8 (eight) hours. 90 tablet 6  . isosorbide mononitrate (IMDUR) 30 MG 24 hr tablet Take 1 tablet (30 mg total) by mouth daily. 30 tablet 6  . lisinopril (PRINIVIL,ZESTRIL) 20 MG tablet Take 1 tablet (20 mg total) by mouth daily. 30 tablet 6  . lovastatin (MEVACOR) 20 MG tablet Take 20 mg by mouth at bedtime.     No current facility-administered medications for this visit.    Past Medical History  Diagnosis Date  . Hypertension     Uncontrolled  . Chronic combined systolic and diastolic CHF (congestive heart failure) ([place])     Echo 06/2010 EF 20-25%, akinesis of the inferior myocardium, severe hypokinesis of the anteroseptal myocardial and grade 3 diastolic dysfunction; Echo 01/2012 mod dilated LV, mod LVH, EF Q000111Q, grade 2 diastolic dysfunction, mod LAE, mild RAE   . CKD (chronic kidney disease)     stage IV   . H/O medication noncompliance     due to inability to afford medications  . H/O cardiovascular stress test     Lexiscan Myoview 06/2010 was negative for inducible ischemia with an EF of 14%.  . Protein in urine     3.9 gm Mar 2012; 1.6gm 01/2012  . Iron deficiency anemia  Past Surgical History  Procedure Laterality Date  . None      Social History   Social History  . Marital Status: Single    Spouse Name: N/A  . Number of Children: N/A  . Years of Education: N/A   Occupational History  . Not on file.   Social History Main Topics  . Smoking status: Never Smoker   . Smokeless tobacco: Never Used  . Alcohol Use: No  . Drug Use: No  . Sexual Activity: Not on file   Other Topics Concern  . Not on file   Social History Narrative   Lives in [place] with friend. Has one son who lives  in [place]. On disability due to hypertension.     No family history of premature CAD in 1st degree relatives.  Prior to Admission medications   Medication Sig Start Date End Date Taking? Authorizing Provider  amLODipine (NORVASC) 10 MG tablet Take 1 tablet (10 mg total) by mouth daily. 01/27/12  Yes Jessica A Hope, PA-C  carvedilol (COREG) 6.25 MG tablet Take 1 tablet (6.25 mg total) by mouth 2 (two) times daily with a meal. 01/27/12  Yes Jessica A Hope, PA-C  ferrous fumarate (HEMOCYTE - 106 MG FE) 325 (106 FE) MG TABS Take 1 tablet (106 mg of iron total) by mouth daily. 01/27/12  Yes Jessica A Hope, PA-C  furosemide (LASIX) 40 MG tablet Take 1 tablet (40 mg total) by mouth 2 (two) times daily. 01/27/12  Yes Jessica A Hope, PA-C  hydrALAZINE (APRESOLINE) 50 MG tablet Take 1 tablet (50 mg total) by mouth every 8 (eight) hours. 01/27/12  Yes Jessica A Hope, PA-C  isosorbide mononitrate (IMDUR) 30 MG 24 hr tablet Take 1 tablet (30 mg total) by mouth daily. 01/27/12  Yes Jessica A Hope, PA-C  lisinopril (PRINIVIL,ZESTRIL) 20 MG tablet Take 1 tablet (20 mg total) by mouth daily. 01/27/12  Yes Jessica A Hope, PA-C  lovastatin (MEVACOR) 20 MG tablet Take 20 mg by mouth at bedtime.   Yes Historical Provider, MD     Review of systems complete and found to be negative unless listed above in HPI     Physical exam Blood pressure 134/82, pulse 99, height 6\' 2"  (1.88 m), weight 223 lb (101.152 kg), SpO2 97 %. General: NAD Neck: No JVD, no thyromegaly or thyroid nodule.  Lungs: Clear to auscultation bilaterally with normal respiratory effort. CV: Nondisplaced PMI. Regular rate and rhythm with frequent premature contractions, normal S1/S2, no S3/S4, no murmur.  No peripheral edema.  No carotid bruit.    Abdomen: Soft, nontender, no hepatosplenomegaly, no distention.  Skin: Intact without lesions or rashes.  Neurologic: Alert and oriented x 3.  Psych: Normal affect. Extremities: No clubbing or  cyanosis.  HEENT: Normal.   ECG: Most recent ECG reviewed.  Labs:   Lab Results  Component Value Date   WBC 8.0 01/23/2012   HGB 10.5* 01/23/2012   HCT 32.8* 01/23/2012   MCV 83.9 01/23/2012   PLT 309 01/23/2012   No results for input(s): NA, K, CL, CO2, BUN, CREATININE, CALCIUM, PROT, BILITOT, ALKPHOS, ALT, AST, GLUCOSE in the last 168 hours.  Invalid input(s): LABALBU Lab Results  Component Value Date   CKTOTAL 78 06/01/2010   TROPONINI 0.54* 01/23/2012    Lab Results  Component Value Date   CHOL * 06/01/2010    214        ATP III CLASSIFICATION:  <200     mg/dL   Desirable  200-239  mg/dL   Borderline High  >=240    mg/dL   High          Lab Results  Component Value Date   HDL 39* 06/01/2010   Lab Results  Component Value Date   LDLCALC * 06/01/2010    153        Total Cholesterol/HDL:CHD Risk Coronary Heart Disease Risk Table                     Men   Women  1/2 Average Risk   3.4   3.3  Average Risk       5.0   4.4  2 X Average Risk   9.6   7.1  3 X Average Risk  23.4   11.0        Use the calculated Patient Ratio above and the CHD Risk Table to determine the patient's CHD Risk.        ATP III CLASSIFICATION (LDL):  <100     mg/dL   Optimal  100-129  mg/dL   Near or Above                    Optimal  130-159  mg/dL   Borderline  160-189  mg/dL   High  >190     mg/dL   Very High   Lab Results  Component Value Date   TRIG 112 06/01/2010   Lab Results  Component Value Date   CHOLHDL 5.5 06/01/2010   No results found for: LDLDIRECT       Studies: No results found.  ASSESSMENT AND PLAN:  1. Chest pain: Numerous cardiovascular risk factors. Will proceed with Lexiscan Cardiolite stress testing and echocardiography. Increase Imdur to 60 mg daily. Already on Coreg. Would consider addition of ASA 81 mg daily.  2. Malignant HTN: Controlled on Coreg, amlodipine, hydralazine, and lisinopril. No changes.  3. Chronic systolic and diastolic heart  failure: Euvolemic. No changes. I will order a 2-D echocardiogram with Doppler to evaluate cardiac structure, function, and regional wall motion.  4. Hyperlipidemia: Lipids will need to be repeated in near future. Consider switching to Lipitor.  Dispo: f/u 4-6 weeks.   Signed: Kate Sable, M.D., F.A.C.C.  06/08/2015, 8:43 AM

## 2015-06-16 ENCOUNTER — Ambulatory Visit (HOSPITAL_COMMUNITY)
Admission: RE | Admit: 2015-06-16 | Discharge: 2015-06-16 | Disposition: A | Discharge status: Home / Self Care | Payer: Medicaid Other | Source: Ambulatory Visit | Attending: Cardiovascular Disease | Admitting: Cardiovascular Disease

## 2015-06-16 DIAGNOSIS — R079 Chest pain, unspecified: Secondary | ICD-10-CM | POA: Diagnosis not present

## 2015-06-16 DIAGNOSIS — I10 Essential (primary) hypertension: Secondary | ICD-10-CM | POA: Diagnosis not present

## 2015-06-17 ENCOUNTER — Encounter (HOSPITAL_COMMUNITY): Payer: Medicaid Other

## 2015-06-17 ENCOUNTER — Inpatient Hospital Stay (HOSPITAL_COMMUNITY): Admission: RE | Admit: 2015-06-17 | Payer: Medicaid Other | Source: Ambulatory Visit

## 2015-07-26 ENCOUNTER — Ambulatory Visit: Payer: Medicaid Other | Admitting: Cardiovascular Disease

## 2015-07-26 ENCOUNTER — Encounter: Payer: Self-pay | Admitting: Cardiovascular Disease

## 2018-01-31 ENCOUNTER — Other Ambulatory Visit (HOSPITAL_COMMUNITY): Payer: Self-pay | Admitting: Nephrology

## 2018-01-31 DIAGNOSIS — N183 Chronic kidney disease, stage 3 unspecified: Secondary | ICD-10-CM

## 2018-02-12 ENCOUNTER — Ambulatory Visit (HOSPITAL_COMMUNITY): Payer: Medicaid Other

## 2021-01-06 ENCOUNTER — Inpatient Hospital Stay (HOSPITAL_COMMUNITY): Payer: Medicaid Other

## 2021-01-06 ENCOUNTER — Emergency Department (HOSPITAL_COMMUNITY): Payer: Medicaid Other

## 2021-01-06 ENCOUNTER — Inpatient Hospital Stay (HOSPITAL_COMMUNITY)
Admission: EM | Admit: 2021-01-06 | Discharge: 2021-03-02 | DRG: 004 | Disposition: E | Payer: Medicaid Other | Attending: Internal Medicine | Admitting: Internal Medicine

## 2021-01-06 DIAGNOSIS — R29724 NIHSS score 24: Secondary | ICD-10-CM | POA: Diagnosis present

## 2021-01-06 DIAGNOSIS — E0520 Thyrotoxicosis with toxic multinodular goiter without thyrotoxic crisis or storm: Secondary | ICD-10-CM | POA: Diagnosis present

## 2021-01-06 DIAGNOSIS — Z66 Do not resuscitate: Secondary | ICD-10-CM | POA: Diagnosis not present

## 2021-01-06 DIAGNOSIS — E440 Moderate protein-calorie malnutrition: Secondary | ICD-10-CM | POA: Diagnosis not present

## 2021-01-06 DIAGNOSIS — N170 Acute kidney failure with tubular necrosis: Secondary | ICD-10-CM | POA: Diagnosis not present

## 2021-01-06 DIAGNOSIS — K0889 Other specified disorders of teeth and supporting structures: Secondary | ICD-10-CM | POA: Diagnosis present

## 2021-01-06 DIAGNOSIS — D509 Iron deficiency anemia, unspecified: Secondary | ICD-10-CM | POA: Diagnosis present

## 2021-01-06 DIAGNOSIS — N184 Chronic kidney disease, stage 4 (severe): Secondary | ICD-10-CM | POA: Diagnosis present

## 2021-01-06 DIAGNOSIS — I674 Hypertensive encephalopathy: Secondary | ICD-10-CM | POA: Diagnosis present

## 2021-01-06 DIAGNOSIS — I639 Cerebral infarction, unspecified: Secondary | ICD-10-CM

## 2021-01-06 DIAGNOSIS — R7989 Other specified abnormal findings of blood chemistry: Secondary | ICD-10-CM | POA: Diagnosis not present

## 2021-01-06 DIAGNOSIS — G935 Compression of brain: Secondary | ICD-10-CM | POA: Diagnosis present

## 2021-01-06 DIAGNOSIS — S0501XA Injury of conjunctiva and corneal abrasion without foreign body, right eye, initial encounter: Secondary | ICD-10-CM | POA: Diagnosis not present

## 2021-01-06 DIAGNOSIS — E872 Acidosis, unspecified: Secondary | ICD-10-CM | POA: Diagnosis present

## 2021-01-06 DIAGNOSIS — Z4659 Encounter for fitting and adjustment of other gastrointestinal appliance and device: Secondary | ICD-10-CM | POA: Diagnosis not present

## 2021-01-06 DIAGNOSIS — E876 Hypokalemia: Secondary | ICD-10-CM | POA: Diagnosis present

## 2021-01-06 DIAGNOSIS — Z452 Encounter for adjustment and management of vascular access device: Secondary | ICD-10-CM

## 2021-01-06 DIAGNOSIS — E870 Hyperosmolality and hypernatremia: Secondary | ICD-10-CM | POA: Diagnosis present

## 2021-01-06 DIAGNOSIS — N179 Acute kidney failure, unspecified: Secondary | ICD-10-CM | POA: Diagnosis not present

## 2021-01-06 DIAGNOSIS — I130 Hypertensive heart and chronic kidney disease with heart failure and stage 1 through stage 4 chronic kidney disease, or unspecified chronic kidney disease: Secondary | ICD-10-CM | POA: Diagnosis present

## 2021-01-06 DIAGNOSIS — G936 Cerebral edema: Secondary | ICD-10-CM | POA: Diagnosis present

## 2021-01-06 DIAGNOSIS — I10 Essential (primary) hypertension: Secondary | ICD-10-CM | POA: Diagnosis not present

## 2021-01-06 DIAGNOSIS — F1020 Alcohol dependence, uncomplicated: Secondary | ICD-10-CM | POA: Diagnosis present

## 2021-01-06 DIAGNOSIS — G510 Bell's palsy: Secondary | ICD-10-CM | POA: Diagnosis present

## 2021-01-06 DIAGNOSIS — R1310 Dysphagia, unspecified: Secondary | ICD-10-CM | POA: Diagnosis present

## 2021-01-06 DIAGNOSIS — J9690 Respiratory failure, unspecified, unspecified whether with hypoxia or hypercapnia: Secondary | ICD-10-CM

## 2021-01-06 DIAGNOSIS — Z79899 Other long term (current) drug therapy: Secondary | ICD-10-CM

## 2021-01-06 DIAGNOSIS — F419 Anxiety disorder, unspecified: Secondary | ICD-10-CM | POA: Diagnosis present

## 2021-01-06 DIAGNOSIS — R532 Functional quadriplegia: Secondary | ICD-10-CM | POA: Diagnosis not present

## 2021-01-06 DIAGNOSIS — N2581 Secondary hyperparathyroidism of renal origin: Secondary | ICD-10-CM | POA: Diagnosis present

## 2021-01-06 DIAGNOSIS — Z930 Tracheostomy status: Secondary | ICD-10-CM | POA: Diagnosis not present

## 2021-01-06 DIAGNOSIS — I161 Hypertensive emergency: Secondary | ICD-10-CM | POA: Diagnosis present

## 2021-01-06 DIAGNOSIS — E0781 Sick-euthyroid syndrome: Secondary | ICD-10-CM | POA: Diagnosis present

## 2021-01-06 DIAGNOSIS — R1312 Dysphagia, oropharyngeal phase: Secondary | ICD-10-CM | POA: Diagnosis not present

## 2021-01-06 DIAGNOSIS — J690 Pneumonitis due to inhalation of food and vomit: Secondary | ICD-10-CM | POA: Diagnosis not present

## 2021-01-06 DIAGNOSIS — Z91128 Patient's intentional underdosing of medication regimen for other reason: Secondary | ICD-10-CM

## 2021-01-06 DIAGNOSIS — Z7189 Other specified counseling: Secondary | ICD-10-CM | POA: Diagnosis not present

## 2021-01-06 DIAGNOSIS — I5042 Chronic combined systolic (congestive) and diastolic (congestive) heart failure: Secondary | ICD-10-CM | POA: Diagnosis present

## 2021-01-06 DIAGNOSIS — F17210 Nicotine dependence, cigarettes, uncomplicated: Secondary | ICD-10-CM | POA: Diagnosis present

## 2021-01-06 DIAGNOSIS — Z20822 Contact with and (suspected) exposure to covid-19: Secondary | ICD-10-CM | POA: Diagnosis present

## 2021-01-06 DIAGNOSIS — I615 Nontraumatic intracerebral hemorrhage, intraventricular: Secondary | ICD-10-CM | POA: Diagnosis not present

## 2021-01-06 DIAGNOSIS — R627 Adult failure to thrive: Secondary | ICD-10-CM | POA: Diagnosis not present

## 2021-01-06 DIAGNOSIS — D631 Anemia in chronic kidney disease: Secondary | ICD-10-CM | POA: Diagnosis present

## 2021-01-06 DIAGNOSIS — Z515 Encounter for palliative care: Secondary | ICD-10-CM | POA: Diagnosis not present

## 2021-01-06 DIAGNOSIS — Z978 Presence of other specified devices: Secondary | ICD-10-CM | POA: Diagnosis not present

## 2021-01-06 DIAGNOSIS — I613 Nontraumatic intracerebral hemorrhage in brain stem: Principal | ICD-10-CM | POA: Diagnosis present

## 2021-01-06 DIAGNOSIS — Z9889 Other specified postprocedural states: Secondary | ICD-10-CM

## 2021-01-06 DIAGNOSIS — E875 Hyperkalemia: Secondary | ICD-10-CM | POA: Diagnosis not present

## 2021-01-06 DIAGNOSIS — I6389 Other cerebral infarction: Secondary | ICD-10-CM | POA: Diagnosis not present

## 2021-01-06 DIAGNOSIS — E785 Hyperlipidemia, unspecified: Secondary | ICD-10-CM | POA: Diagnosis present

## 2021-01-06 DIAGNOSIS — Z91199 Patient's noncompliance with other medical treatment and regimen due to unspecified reason: Secondary | ICD-10-CM

## 2021-01-06 DIAGNOSIS — H02201 Unspecified lagophthalmos right upper eyelid: Secondary | ICD-10-CM | POA: Diagnosis present

## 2021-01-06 DIAGNOSIS — N186 End stage renal disease: Secondary | ICD-10-CM | POA: Diagnosis not present

## 2021-01-06 DIAGNOSIS — J9601 Acute respiratory failure with hypoxia: Secondary | ICD-10-CM | POA: Diagnosis not present

## 2021-01-06 DIAGNOSIS — Z9114 Patient's other noncompliance with medication regimen: Secondary | ICD-10-CM

## 2021-01-06 DIAGNOSIS — R4182 Altered mental status, unspecified: Secondary | ICD-10-CM | POA: Diagnosis present

## 2021-01-06 DIAGNOSIS — Z430 Encounter for attention to tracheostomy: Secondary | ICD-10-CM | POA: Diagnosis not present

## 2021-01-06 LAB — URINALYSIS, ROUTINE W REFLEX MICROSCOPIC
Bacteria, UA: NONE SEEN
Bilirubin Urine: NEGATIVE
Glucose, UA: 50 mg/dL — AB
Ketones, ur: NEGATIVE mg/dL
Leukocytes,Ua: NEGATIVE
Nitrite: NEGATIVE
Protein, ur: >=300 mg/dL — AB
Specific Gravity, Urine: 1.015 (ref 1.005–1.030)
pH: 6 (ref 5.0–8.0)

## 2021-01-06 LAB — POCT I-STAT 7, (LYTES, BLD GAS, ICA,H+H)
Acid-base deficit: 2 mmol/L (ref 0.0–2.0)
Bicarbonate: 22.6 mmol/L (ref 20.0–28.0)
Calcium, Ion: 1.2 mmol/L (ref 1.15–1.40)
HCT: 28 % — ABNORMAL LOW (ref 39.0–52.0)
Hemoglobin: 9.5 g/dL — ABNORMAL LOW (ref 13.0–17.0)
O2 Saturation: 98 %
Patient temperature: 97.9
Potassium: 2.9 mmol/L — ABNORMAL LOW (ref 3.5–5.1)
Sodium: 149 mmol/L — ABNORMAL HIGH (ref 135–145)
TCO2: 24 mmol/L (ref 22–32)
pCO2 arterial: 35.7 mmHg (ref 32.0–48.0)
pH, Arterial: 7.409 (ref 7.350–7.450)
pO2, Arterial: 96 mmHg (ref 83.0–108.0)

## 2021-01-06 LAB — SODIUM
Sodium: 142 mmol/L (ref 135–145)
Sodium: 147 mmol/L — ABNORMAL HIGH (ref 135–145)

## 2021-01-06 LAB — COMPREHENSIVE METABOLIC PANEL
ALT: 15 U/L (ref 0–44)
AST: 31 U/L (ref 15–41)
Albumin: 3.3 g/dL — ABNORMAL LOW (ref 3.5–5.0)
Alkaline Phosphatase: 71 U/L (ref 38–126)
Anion gap: 13 (ref 5–15)
BUN: 34 mg/dL — ABNORMAL HIGH (ref 6–20)
CO2: 23 mmol/L (ref 22–32)
Calcium: 9.1 mg/dL (ref 8.9–10.3)
Chloride: 105 mmol/L (ref 98–111)
Creatinine, Ser: 3.65 mg/dL — ABNORMAL HIGH (ref 0.61–1.24)
GFR, Estimated: 19 mL/min — ABNORMAL LOW (ref >60)
Glucose, Bld: 136 mg/dL — ABNORMAL HIGH (ref 70–99)
Potassium: 3.8 mmol/L (ref 3.5–5.1)
Sodium: 141 mmol/L (ref 135–145)
Total Bilirubin: 0.8 mg/dL (ref 0.3–1.2)
Total Protein: 7.6 g/dL (ref 6.5–8.1)

## 2021-01-06 LAB — GLUCOSE, CAPILLARY: Glucose-Capillary: 105 mg/dL — ABNORMAL HIGH (ref 70–99)

## 2021-01-06 LAB — CBC WITH DIFFERENTIAL/PLATELET
Abs Immature Granulocytes: 0.03 10*3/uL (ref 0.00–0.07)
Basophils Absolute: 0.1 10*3/uL (ref 0.0–0.1)
Basophils Relative: 1 %
Eosinophils Absolute: 0 10*3/uL (ref 0.0–0.5)
Eosinophils Relative: 0 %
HCT: 33.4 % — ABNORMAL LOW (ref 39.0–52.0)
Hemoglobin: 10.9 g/dL — ABNORMAL LOW (ref 13.0–17.0)
Immature Granulocytes: 0 %
Lymphocytes Relative: 5 %
Lymphs Abs: 0.5 10*3/uL — ABNORMAL LOW (ref 0.7–4.0)
MCH: 30.8 pg (ref 26.0–34.0)
MCHC: 32.6 g/dL (ref 30.0–36.0)
MCV: 94.4 fL (ref 80.0–100.0)
Monocytes Absolute: 0.4 10*3/uL (ref 0.1–1.0)
Monocytes Relative: 4 %
Neutro Abs: 9.4 10*3/uL — ABNORMAL HIGH (ref 1.7–7.7)
Neutrophils Relative %: 90 %
Platelets: 251 10*3/uL (ref 150–400)
RBC: 3.54 MIL/uL — ABNORMAL LOW (ref 4.22–5.81)
RDW: 14.3 % (ref 11.5–15.5)
WBC: 10.4 10*3/uL (ref 4.0–10.5)
nRBC: 0 % (ref 0.0–0.2)

## 2021-01-06 LAB — URINALYSIS, COMPLETE (UACMP) WITH MICROSCOPIC
Bilirubin Urine: NEGATIVE
Glucose, UA: NEGATIVE mg/dL
Ketones, ur: 5 mg/dL — AB
Leukocytes,Ua: NEGATIVE
Nitrite: NEGATIVE
Protein, ur: >=300 mg/dL — AB
Specific Gravity, Urine: 1.016 (ref 1.005–1.030)
pH: 6 (ref 5.0–8.0)

## 2021-01-06 LAB — BLOOD GAS, ARTERIAL
Acid-base deficit: 0.1 mmol/L (ref 0.0–2.0)
Bicarbonate: 24.6 mmol/L (ref 20.0–28.0)
FIO2: 100
O2 Saturation: 99.2 %
Patient temperature: 37.3
pCO2 arterial: 35.3 mmHg (ref 32.0–48.0)
pH, Arterial: 7.44 (ref 7.350–7.450)
pO2, Arterial: 247 mmHg — ABNORMAL HIGH (ref 83.0–108.0)

## 2021-01-06 LAB — HIV ANTIBODY (ROUTINE TESTING W REFLEX): HIV Screen 4th Generation wRfx: NONREACTIVE

## 2021-01-06 LAB — RAPID URINE DRUG SCREEN, HOSP PERFORMED
Amphetamines: NOT DETECTED
Barbiturates: NOT DETECTED
Benzodiazepines: NOT DETECTED
Cocaine: NOT DETECTED
Opiates: NOT DETECTED
Tetrahydrocannabinol: NOT DETECTED

## 2021-01-06 LAB — BLOOD GAS, VENOUS
Acid-base deficit: 0.4 mmol/L (ref 0.0–2.0)
Bicarbonate: 23.5 mmol/L (ref 20.0–28.0)
FIO2: 100
O2 Saturation: 80 %
Patient temperature: 37.3
pCO2, Ven: 46.2 mmHg (ref 44.0–60.0)
pH, Ven: 7.346 (ref 7.250–7.430)
pO2, Ven: 54.4 mmHg — ABNORMAL HIGH (ref 32.0–45.0)

## 2021-01-06 LAB — TSH: TSH: 0.267 u[IU]/mL — ABNORMAL LOW (ref 0.350–4.500)

## 2021-01-06 LAB — CK: Total CK: 419 U/L — ABNORMAL HIGH (ref 49–397)

## 2021-01-06 LAB — APTT: aPTT: 28 seconds (ref 24–36)

## 2021-01-06 LAB — RESP PANEL BY RT-PCR (FLU A&B, COVID) ARPGX2
Influenza A by PCR: NEGATIVE
Influenza B by PCR: NEGATIVE
SARS Coronavirus 2 by RT PCR: NEGATIVE

## 2021-01-06 LAB — TROPONIN I (HIGH SENSITIVITY)
Troponin I (High Sensitivity): 24 ng/L — ABNORMAL HIGH (ref <18)
Troponin I (High Sensitivity): 36 ng/L — ABNORMAL HIGH (ref <18)

## 2021-01-06 LAB — LACTIC ACID, PLASMA
Lactic Acid, Venous: 2 mmol/L (ref 0.5–1.9)
Lactic Acid, Venous: 1.7 mmol/L (ref 0.5–1.9)

## 2021-01-06 LAB — MRSA NEXT GEN BY PCR, NASAL: MRSA by PCR Next Gen: NOT DETECTED

## 2021-01-06 LAB — MAGNESIUM: Magnesium: 1.4 mg/dL — ABNORMAL LOW (ref 1.7–2.4)

## 2021-01-06 LAB — PROTIME-INR
INR: 1 (ref 0.8–1.2)
Prothrombin Time: 13 seconds (ref 11.4–15.2)

## 2021-01-06 LAB — ETHANOL: Alcohol, Ethyl (B): <10 mg/dL (ref <10)

## 2021-01-06 LAB — LIPASE, BLOOD: Lipase: 28 U/L (ref 11–51)

## 2021-01-06 LAB — BRAIN NATRIURETIC PEPTIDE: B Natriuretic Peptide: 214 pg/mL — ABNORMAL HIGH (ref 0.0–100.0)

## 2021-01-06 IMAGING — DX DG CHEST 1V PORT
1 series · 1 of 1 positions shown · non-contrast
Comparison: No recent comparisons, most recent prior is from
[DATE].

CLINICAL DATA: A 55-year-old male presents with sepsis now
intubated.

EXAM:
PORTABLE CHEST 1 VIEW

[chest ap]
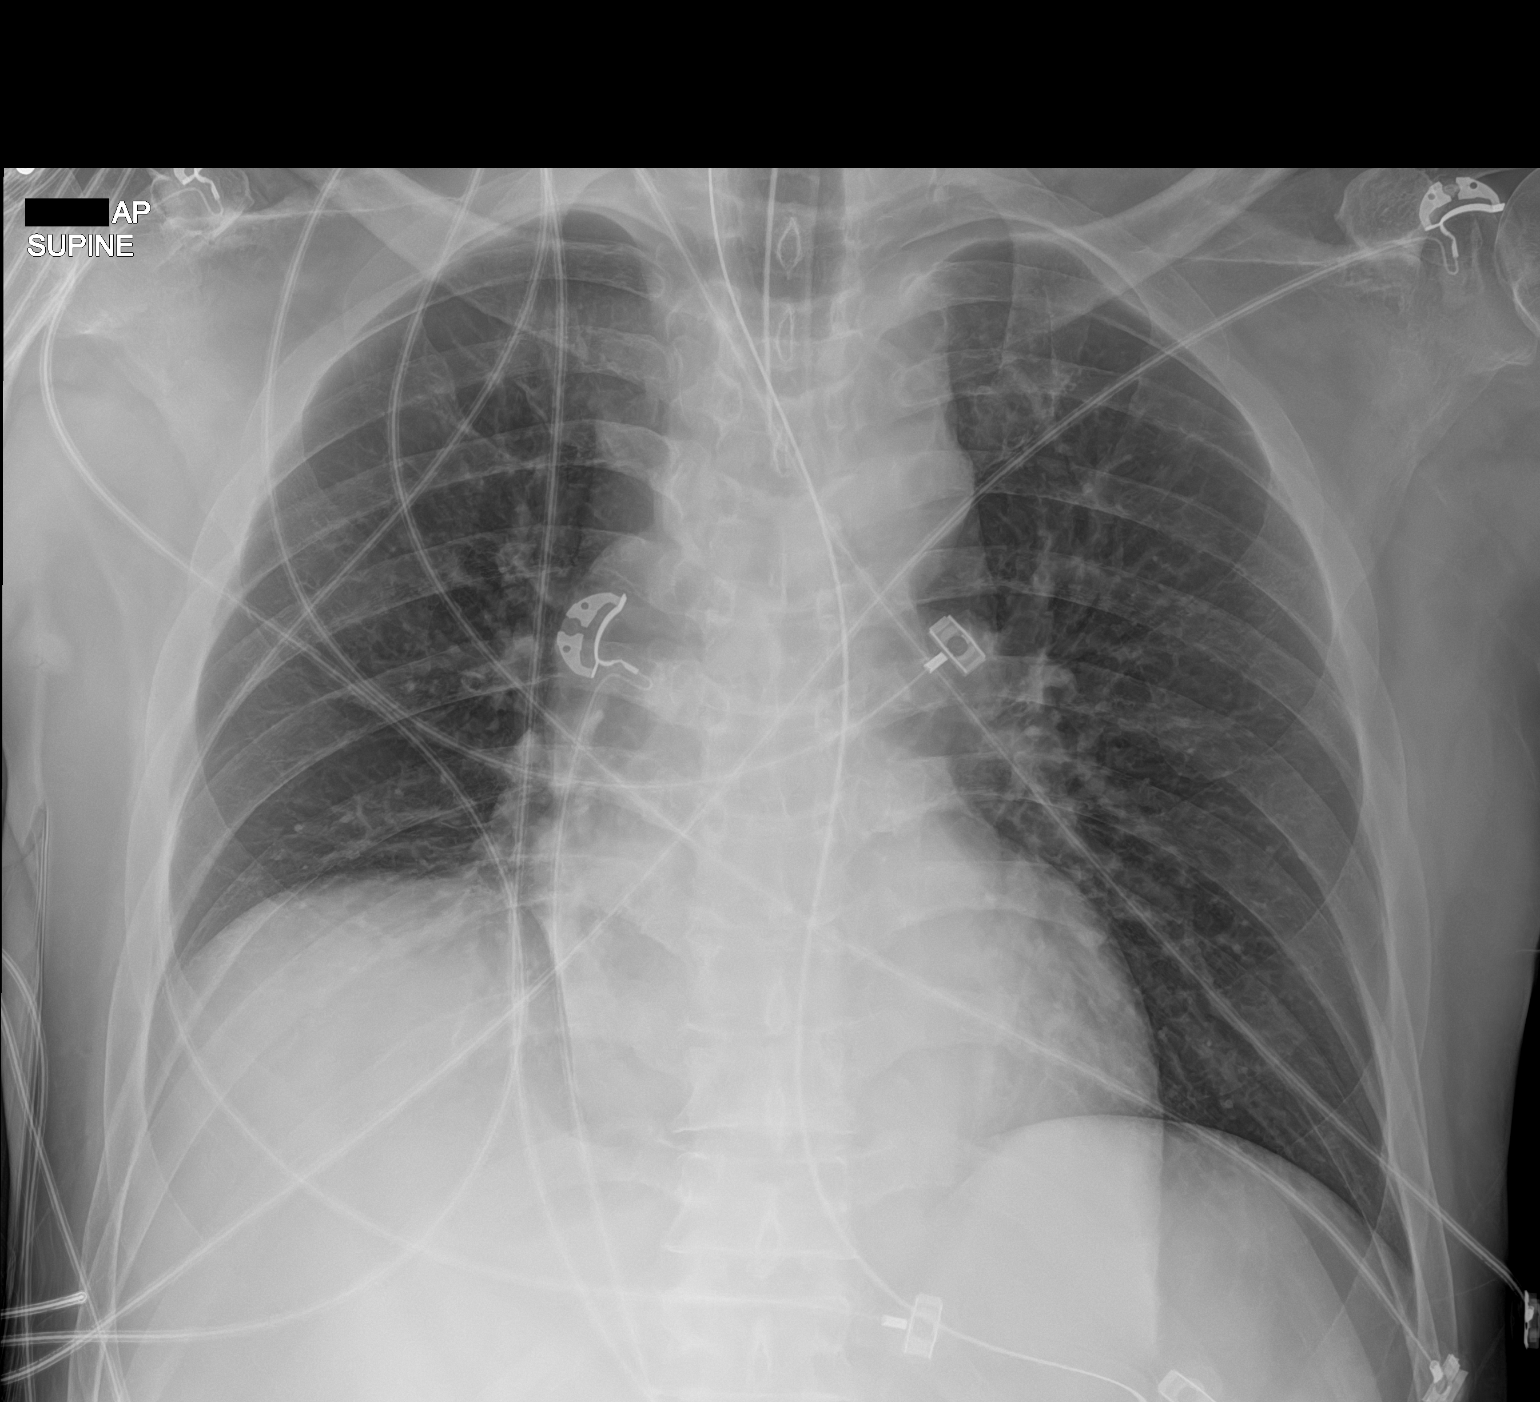

[1 of 1 positions shown; findings below may reference images not displayed]

FINDINGS: EKG leads project over the chest.

A gastric tube courses through in off the field of the radiograph.
Side port is below GE junction, tip is not visualized.

Endotracheal tube at 2.5 cm from the carina.

Cardiomediastinal contours are normal. RIGHT hemidiaphragm is
elevated. This is slightly accentuated when compared to prior
imaging but there is no sign of consolidation, effusion or
pneumothorax.

On limited assessment there is no acute skeletal process.

Incidental note is made of what appears to be in hearing projecting
over the RIGHT supraclavicular region and therefore more likely
external to the patient.
IMPRESSION: Endotracheal tube is at 2.5 cm from the carina.

No acute cardiopulmonary process.

Gastric tube courses through off the field of the radiograph.

And earring projects over the RIGHT supraclavicular region, may be
anterior or posterior to the patient.

## 2021-01-06 IMAGING — CT CT HEAD W/O CM
4 series · 16 of 47 positions shown, 18 images · non-contrast
Comparison: None.

CLINICAL DATA: Mental status change.  Found unresponsive.

EXAM:
CT HEAD WITHOUT CONTRAST
TECHNIQUE: Contiguous axial images were obtained from the base of the skull
through the vertex without intravenous contrast.

[Series 2: head w o · axial · 0.48mm/px · z∈[+1629,+1749]mm · 7 of 34 slices shown, 9 images]
[im 5/34  brain]
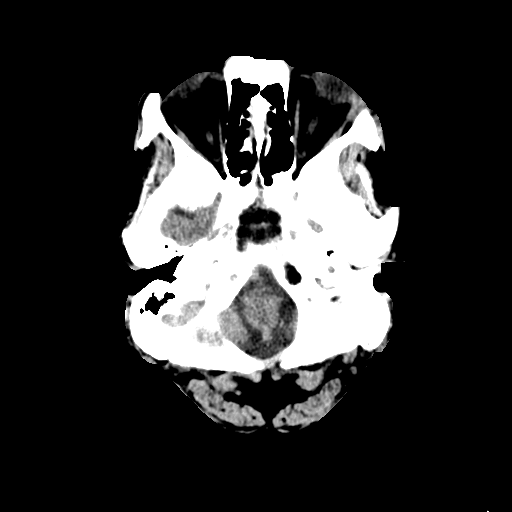
[im 5/34  bone]
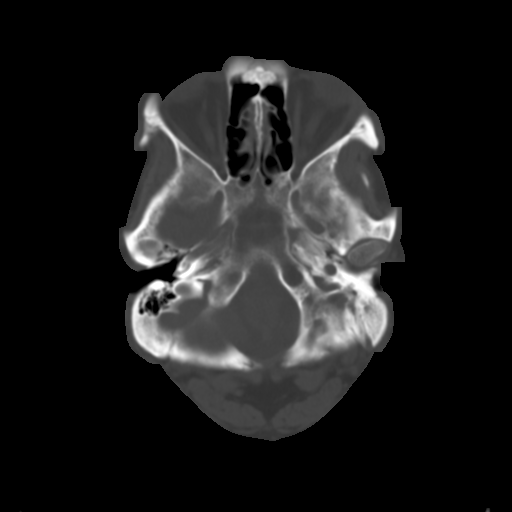
[im 9/34  brain]
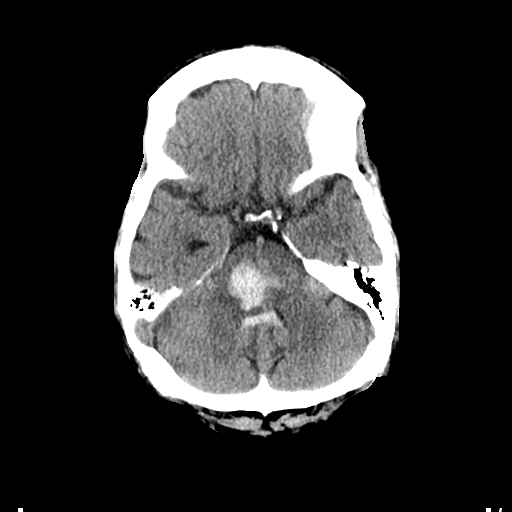
[im 13/34  brain]
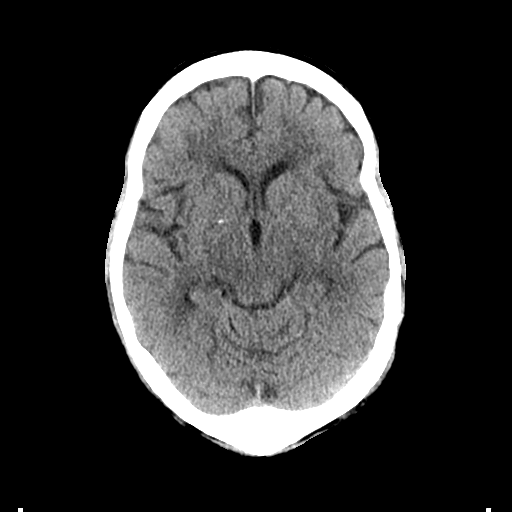
[im 17/34  brain]
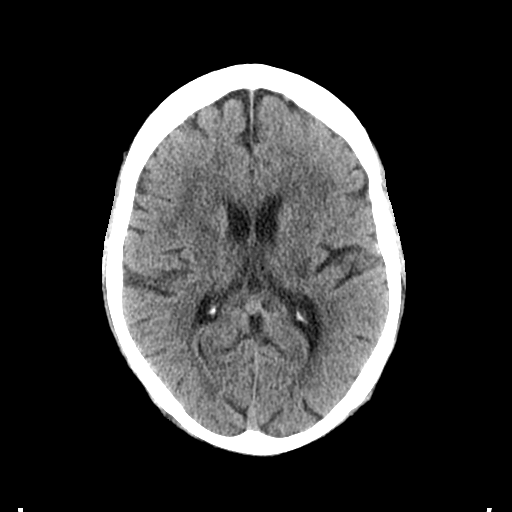
[im 21/34  brain]
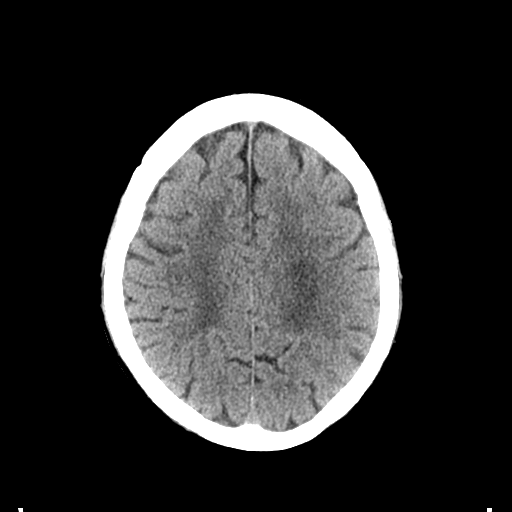
[im 21/34  bone]
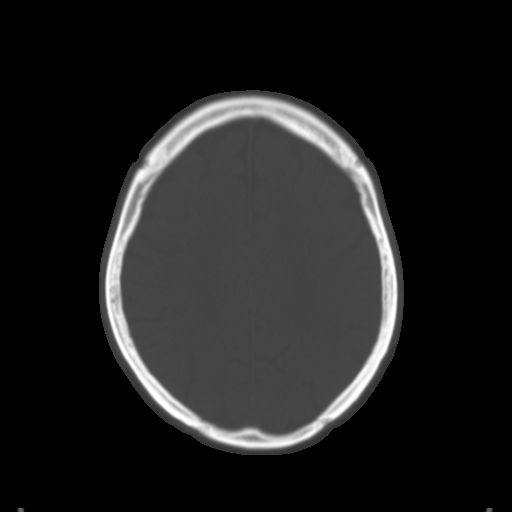
[im 25/34  brain]
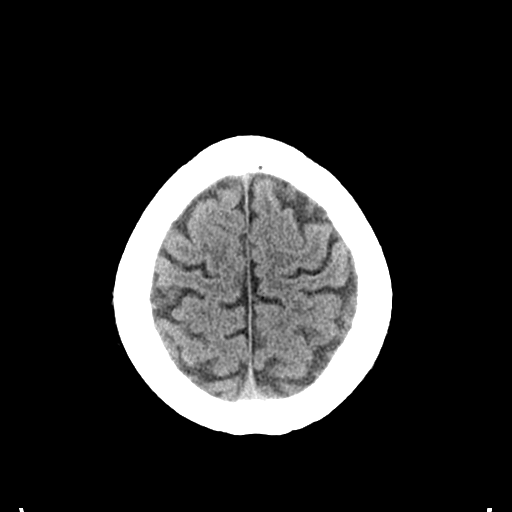
[im 29/34  brain]
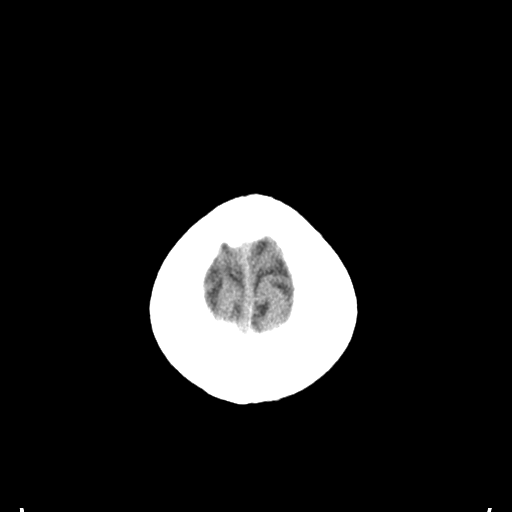

[Series 3: head bone · axial · 0.48mm/px · z∈[+1625,+1659]mm · 3 of 85 slices shown]
[im 9/85  bone]
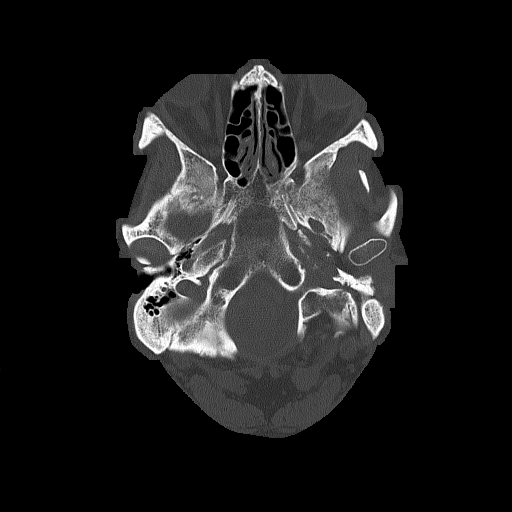
[im 17/85  bone]
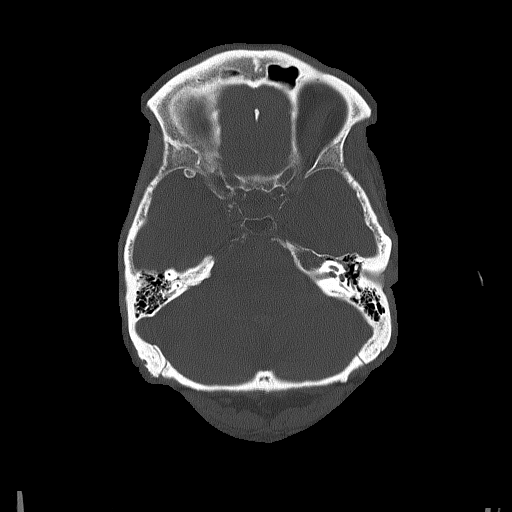
[im 26/85  bone]
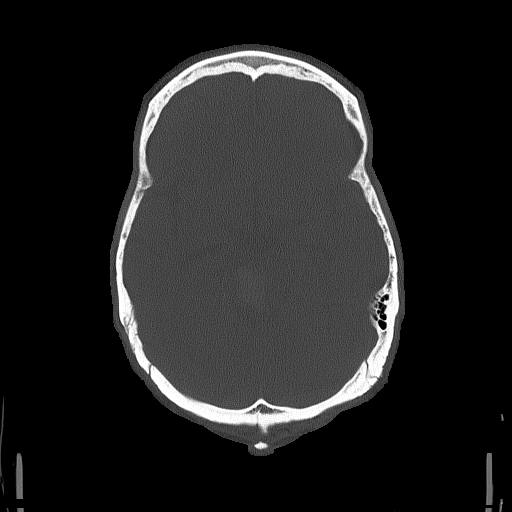

[Series 4: coronal soft · coronal · 0.36mm/px · 3 of 72 slices shown]
[im 24/72  brain]
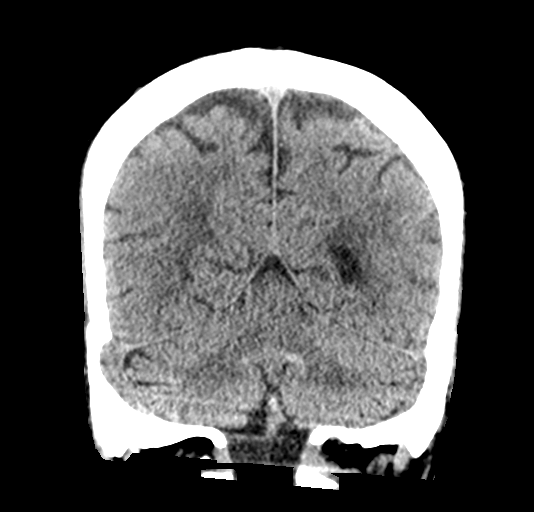
[im 32/72  brain]
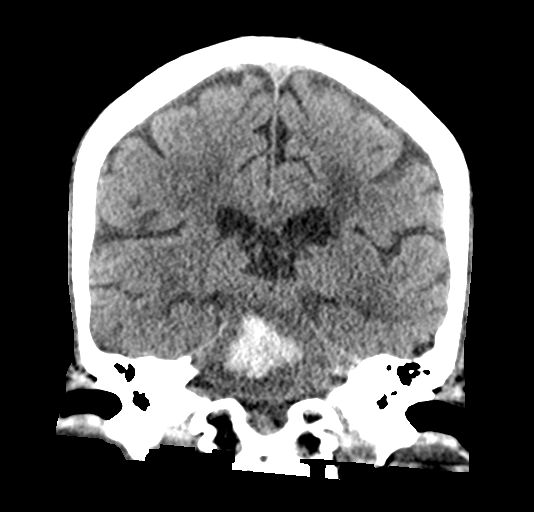
[im 40/72  brain]
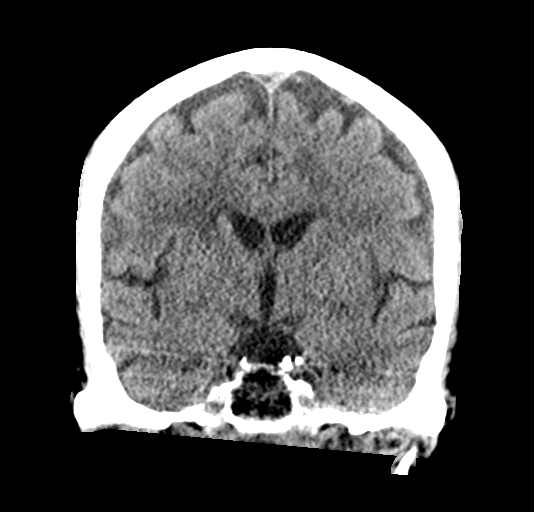

[Series 5: sagittal soft · sagittal · 0.36mm/px · 3 of 64 slices shown]
[im 22/64  brain]
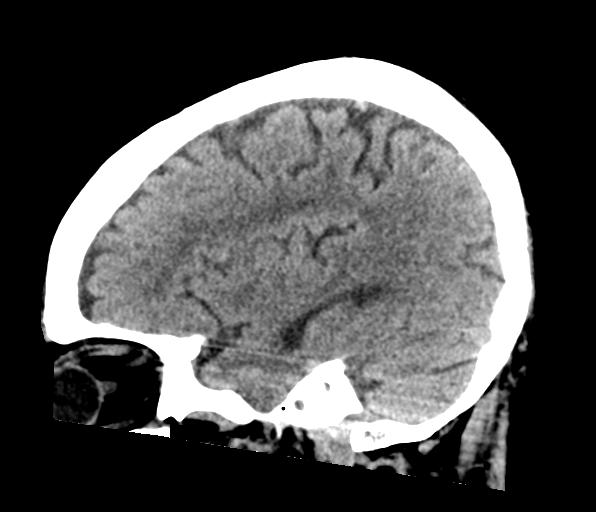
[im 32/64  brain]
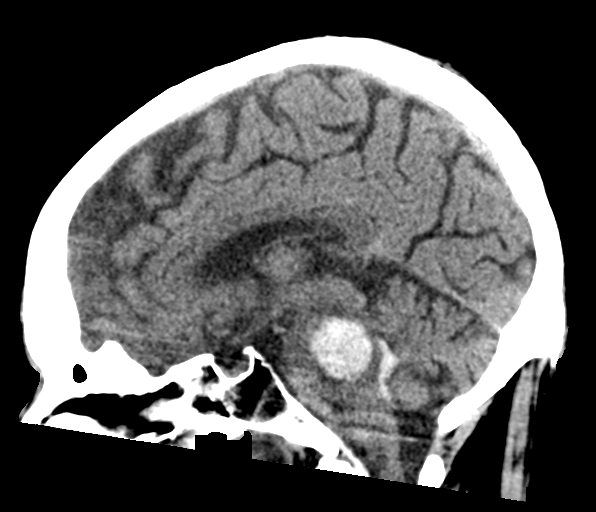
[im 43/64  brain]
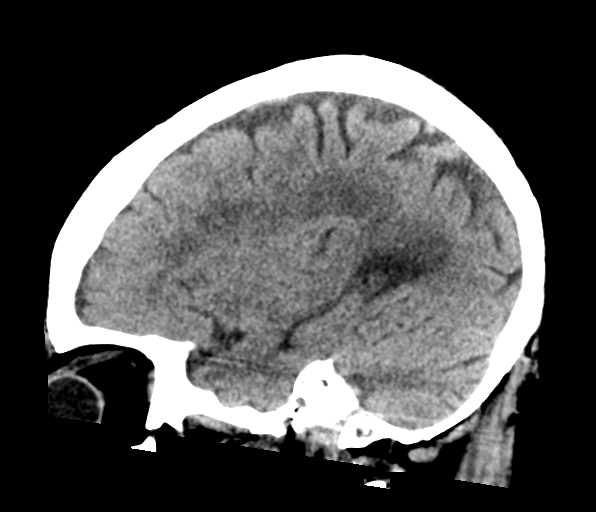

[16 of 47 positions shown; findings below may reference images not displayed]

FINDINGS: Brain: There is a hyperdense mass within the brainstem measuring
by 2.1 by 2.6 cm compatible with hemorrhagic infarct. There is
surrounding low attenuation edema. Posterior extension of clot is
identified into the fourth ventricle and the cisterna magna, image
70/2. decreased CSF space is noted at the cerebellar pontine angle.
There is moderate patchy low-attenuation within the subcortical and
periventricular white matter compatible with chronic microvascular
disease. Prominence of the sulci identified compatible with brain
atrophy.

Vascular: No hyperdense vessel or unexpected calcification.

Skull: Normal. Negative for fracture or focal lesion.

Sinuses/Orbits: Mild mucosal thickening is noted involving the
maxillary sinuses. Mastoid air cells are clear.

Other: None.
IMPRESSION: 1. Examination is positive for hemorrhagic infarct within the
brainstem.
2. Posterior extension of clot into the fourth ventricle and the
cisterna magna.
3. Chronic small vessel ischemic disease and brain atrophy.

Critical Value/emergent results were called by telephone at the time
of interpretation on [DATE] at [DATE] to provider THERESSA
, who verbally acknowledged these results.

## 2021-01-06 IMAGING — DX DG CHEST 1V PORT
1 series · 1 of 1 positions shown · non-contrast
Comparison: [DATE]

CLINICAL DATA: Central line placement

EXAM:
PORTABLE CHEST 1 VIEW

[chest]
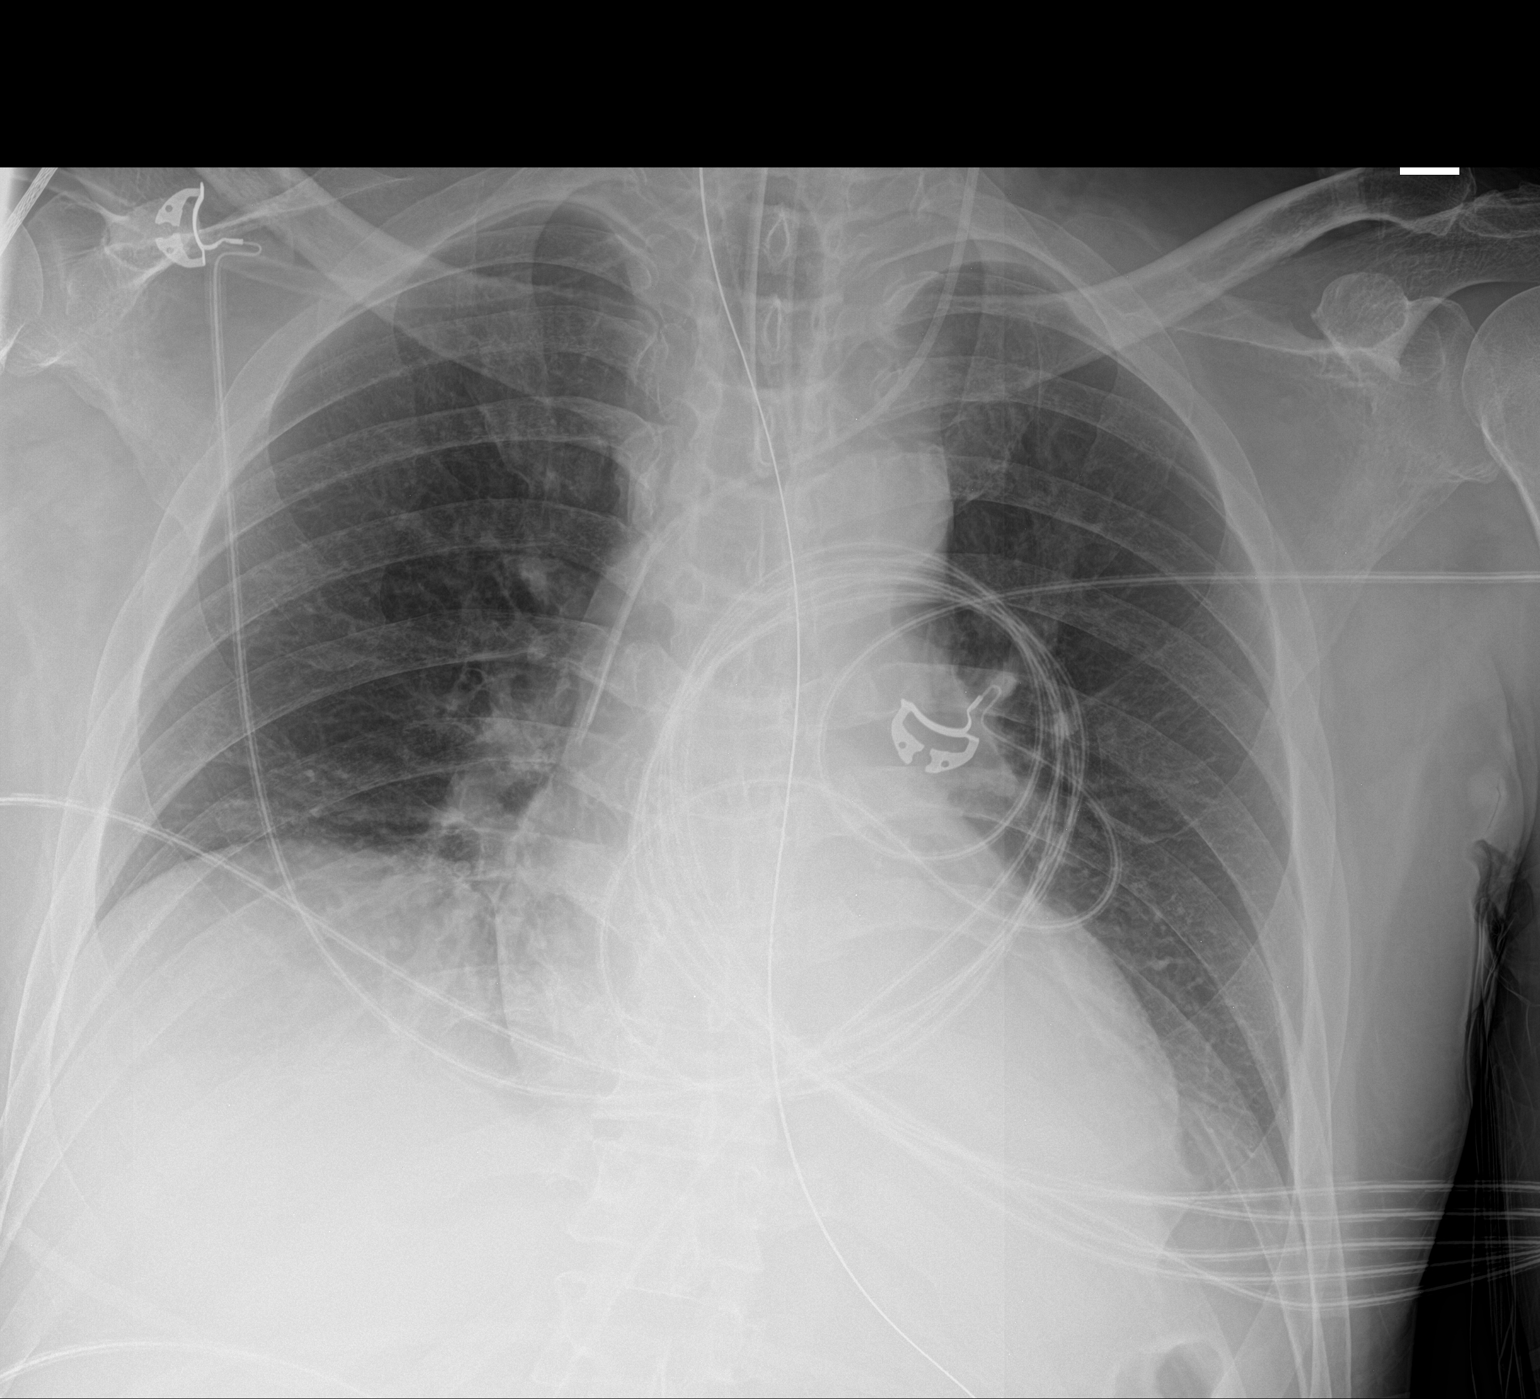

[1 of 1 positions shown; findings below may reference images not displayed]

FINDINGS: Endotracheal tube tip is about 4 cm superior to carina. Left IJ
central venous catheter tip over the SVC. Esophageal tube tip below
the diaphragm. No visible pneumothorax. Interval airspace disease at
left base which may be due to atelectasis or aspiration.
IMPRESSION: 1. Left IJ central venous catheter tip over the SVC. No pneumothorax
2. Interval airspace disease at left base which may be due to
atelectasis or aspiration

## 2021-01-06 IMAGING — DX DG CHEST 1V PORT
1 series · 1 of 1 positions shown · non-contrast
Comparison: None.

CLINICAL DATA: OG tube placement

EXAM:
PORTABLE CHEST 1 VIEW

[chest ap]
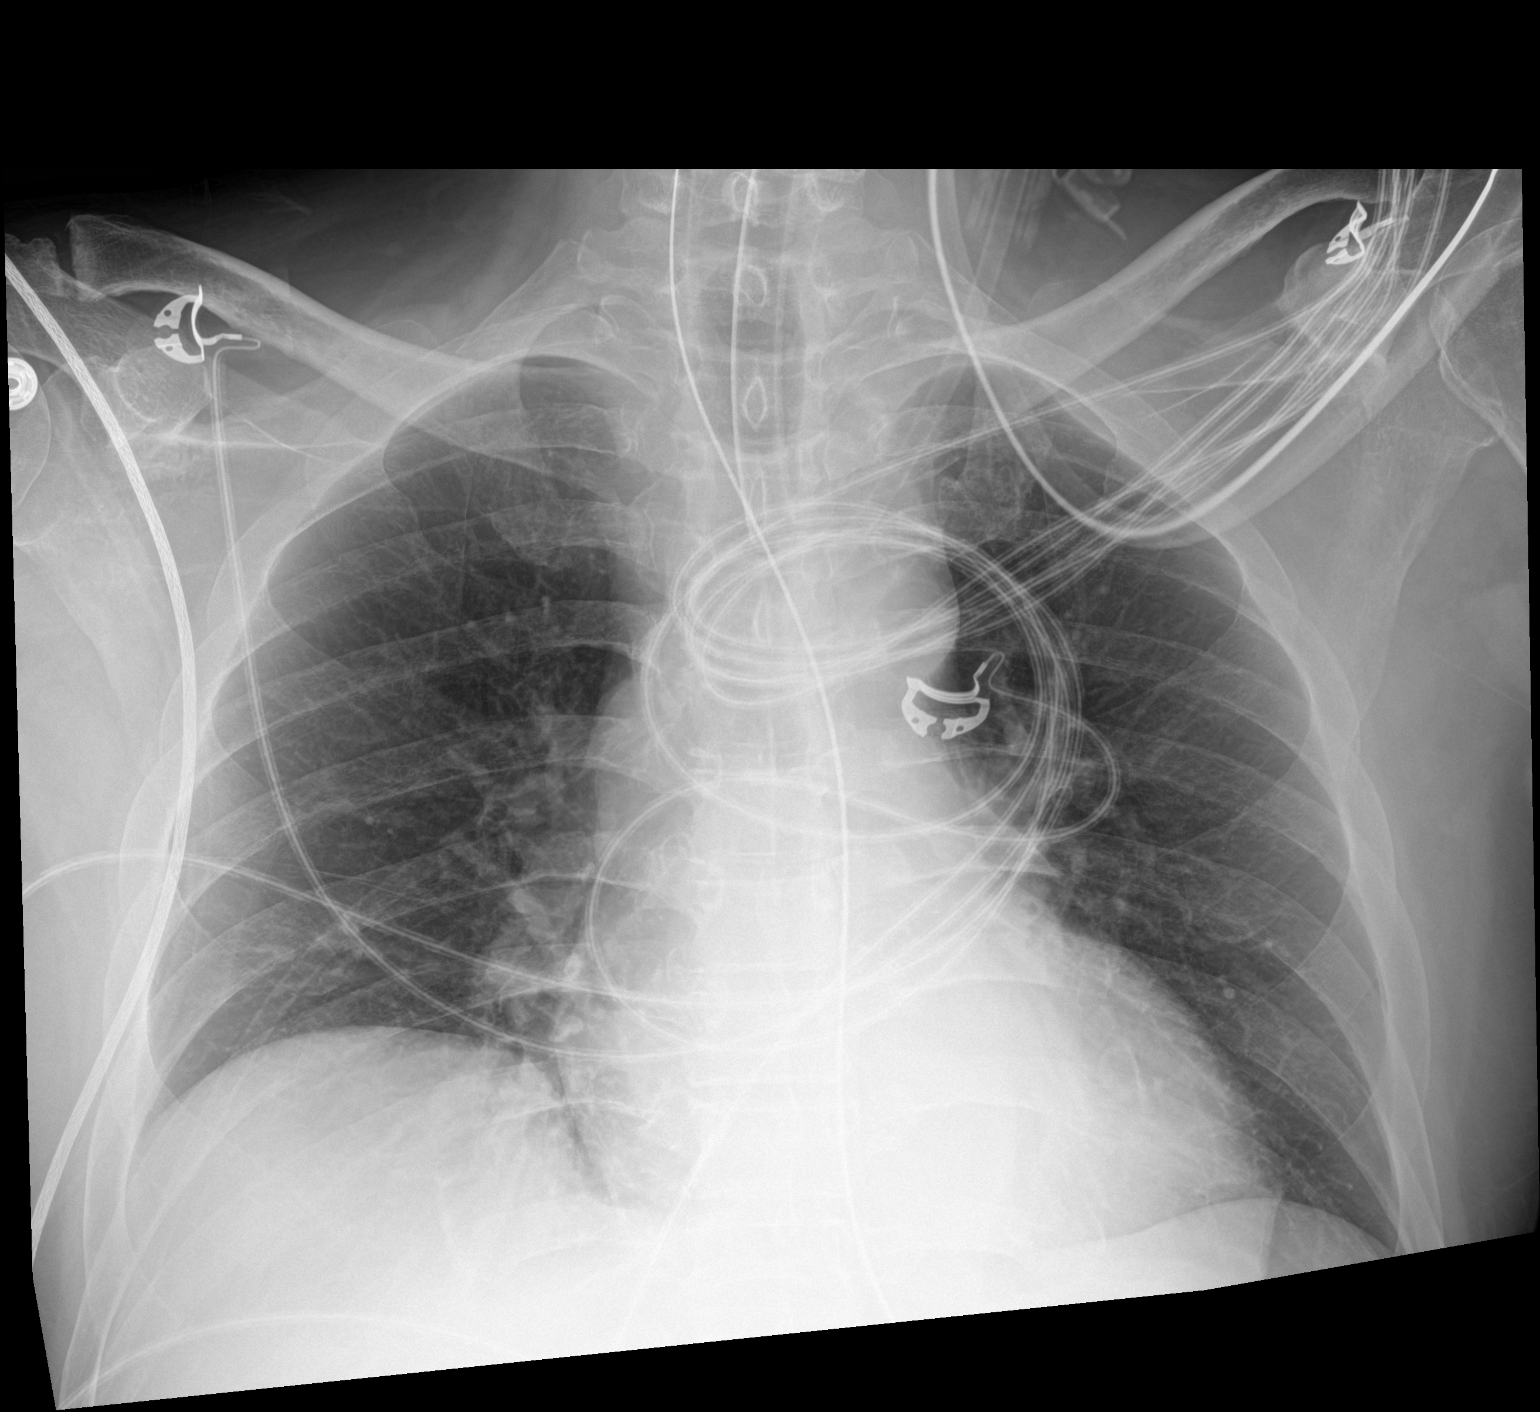

[1 of 1 positions shown; findings below may reference images not displayed]

FINDINGS: Endotracheal tube with the tip 5.8 cm above the carina. Nasogastric
tube coursing below the diaphragm with the tip excluded from the
field of view. No focal consolidation. No pleural effusion or
pneumothorax. Heart and mediastinal contours are unremarkable.

No acute osseous abnormality.
IMPRESSION: Endotracheal tube with the tip 5.8 cm above the carina.

Nasogastric tube coursing below the diaphragm with the tip excluded
from the field of view.

## 2021-01-06 IMAGING — DX DG ABD PORTABLE 1V
2 series · 2 of 2 positions shown · non-contrast
Comparison: None.

CLINICAL DATA: NG tube

EXAM:
PORTABLE ABDOMEN - 1 VIEW

[abdomen kub (1 of 2)]
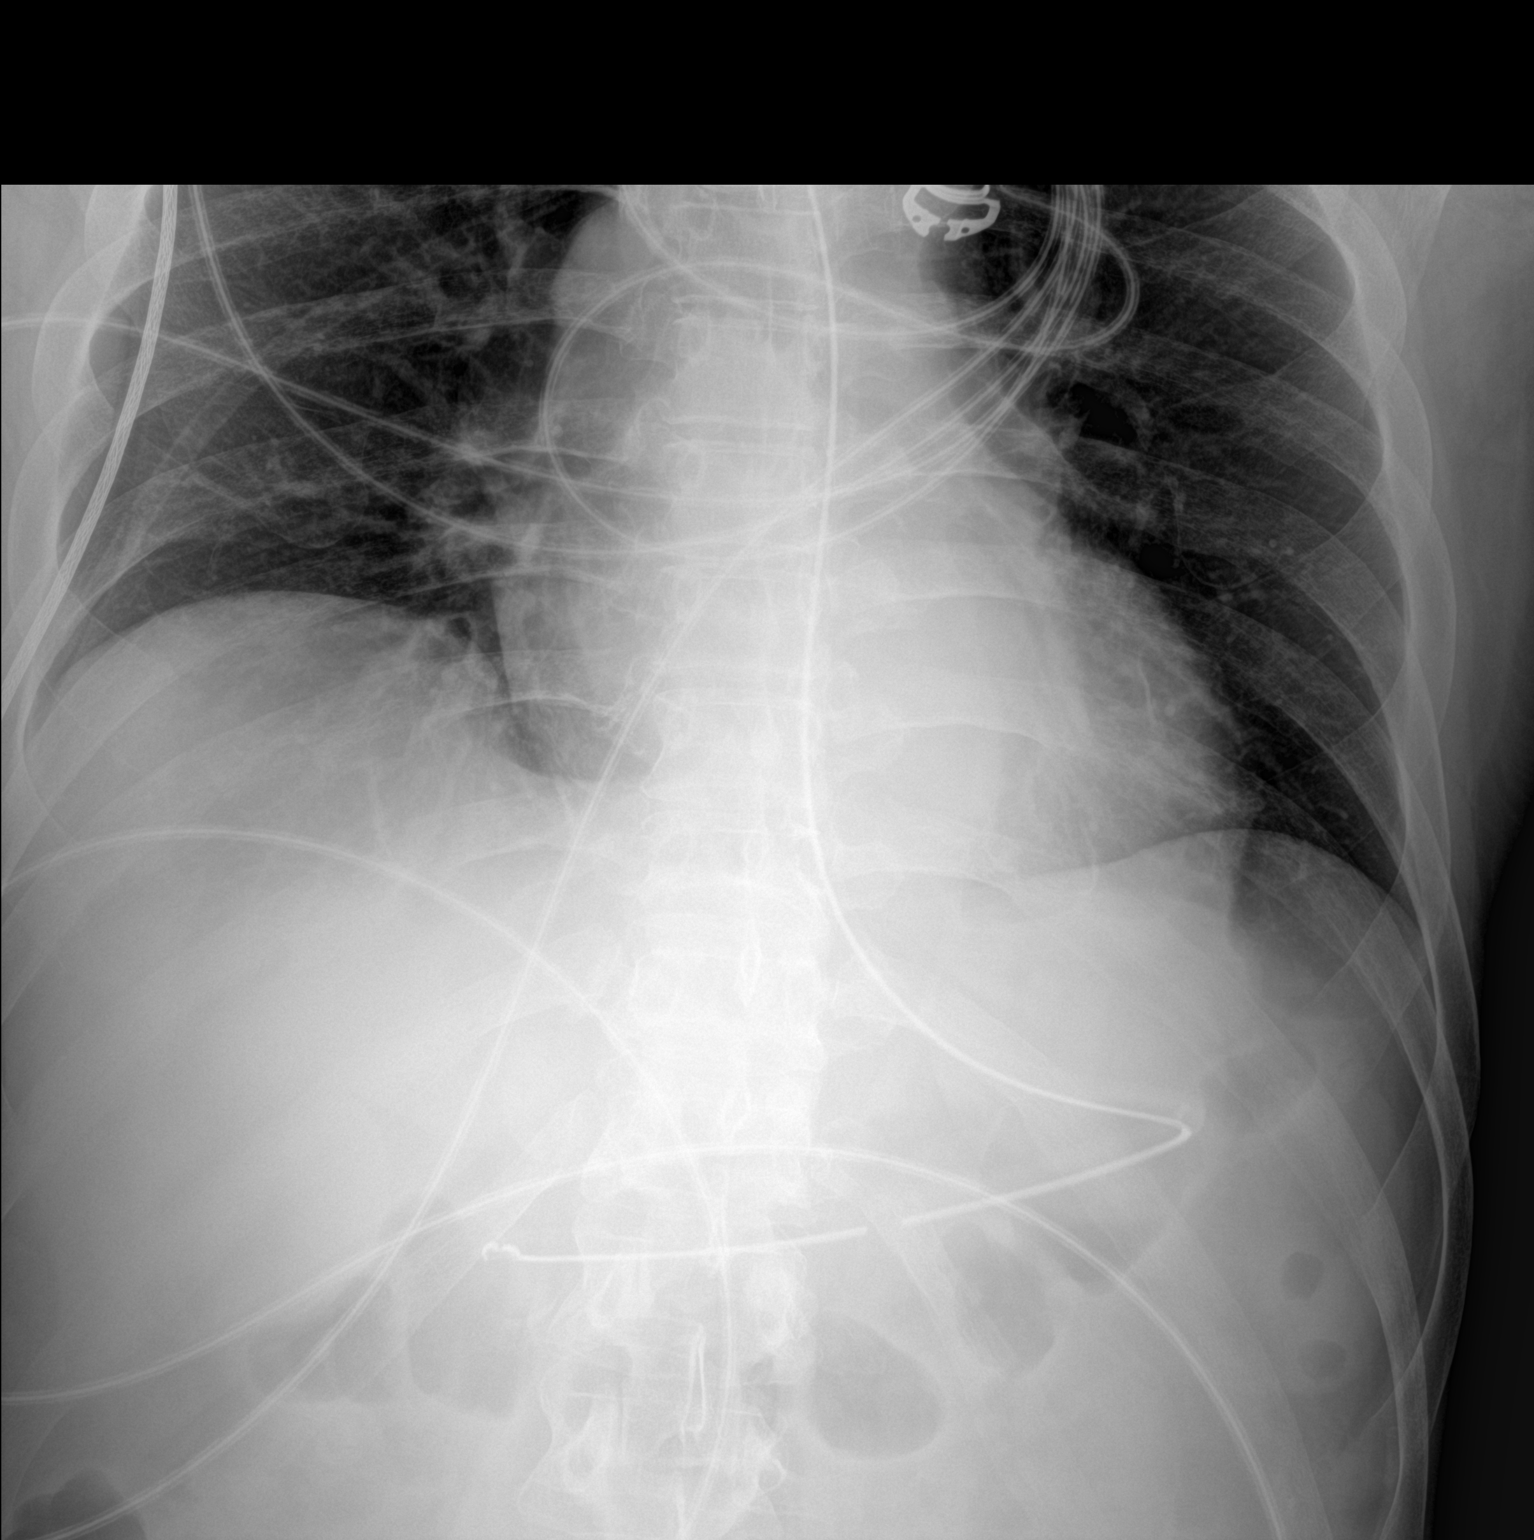

[abdomen kub (2 of 2)]
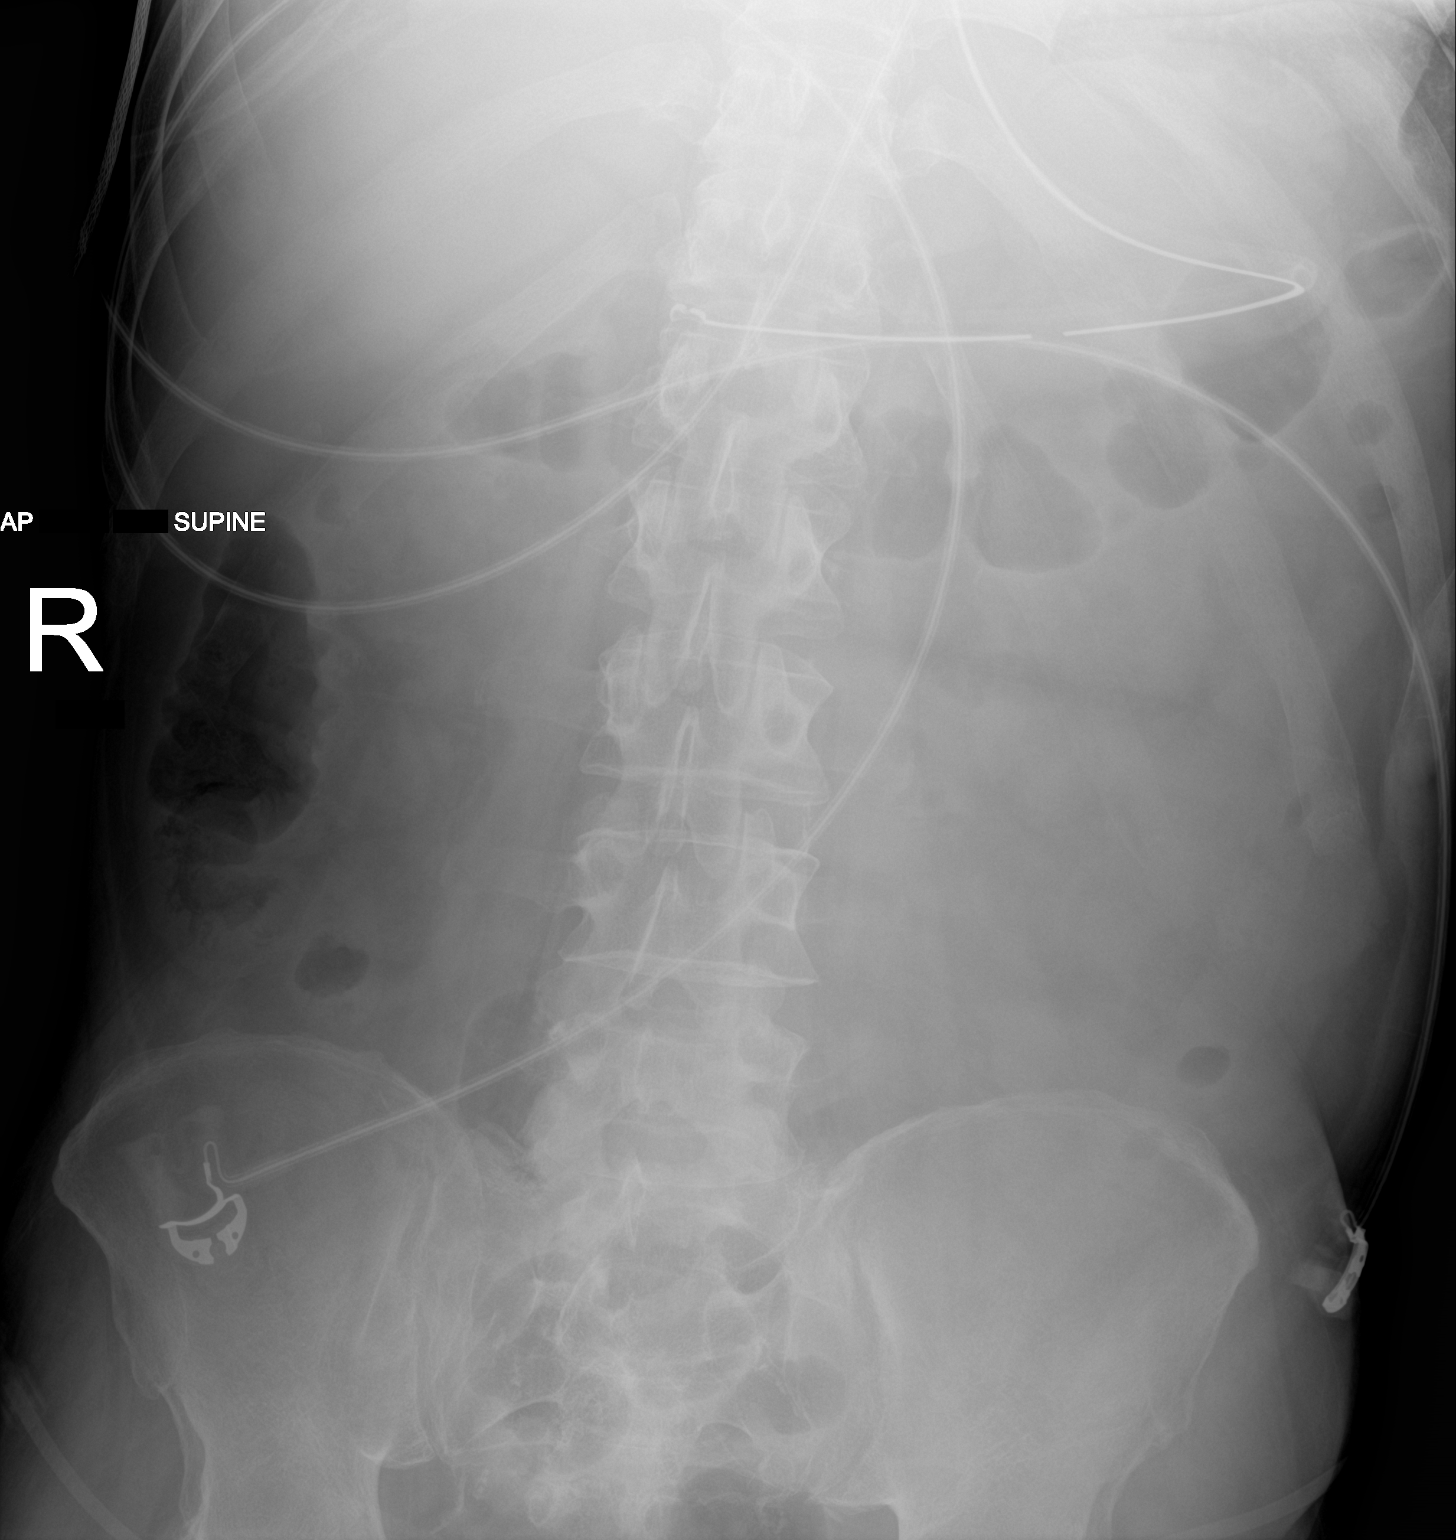

[2 of 2 positions shown; findings below may reference images not displayed]

FINDINGS: Esophageal tube tip overlies the distal stomach. Upper gas pattern
is unremarkable
IMPRESSION: Esophageal tube tip overlies the distal stomach

## 2021-01-06 MED ORDER — SENNOSIDES-DOCUSATE SODIUM 8.6-50 MG PO TABS
1.0000 | ORAL_TABLET | Freq: Two times a day (BID) | ORAL | Status: DC
  Administered 2021-01-07 – 2021-01-09 (×5): 1
  Filled 2021-01-06 (×5): qty 1

## 2021-01-06 MED ORDER — CLEVIDIPINE BUTYRATE 0.5 MG/ML IV EMUL
0.0000 mg/h | INTRAVENOUS | Status: DC
  Administered 2021-01-06: 1 mg/h via INTRAVENOUS
  Administered 2021-01-06: 12 mg/h via INTRAVENOUS
  Administered 2021-01-06: 18 mg/h via INTRAVENOUS
  Administered 2021-01-07: 12 mg/h via INTRAVENOUS
  Administered 2021-01-07 (×2): 16 mg/h via INTRAVENOUS
  Administered 2021-01-07: 12 mg/h via INTRAVENOUS
  Filled 2021-01-06 (×6): qty 50
  Filled 2021-01-06: qty 100
  Filled 2021-01-06 (×2): qty 50

## 2021-01-06 MED ORDER — ACETAMINOPHEN 325 MG PO TABS: 650.0000 mg | ORAL_TABLET | ORAL | Status: DC | PRN

## 2021-01-06 MED ORDER — POLYETHYLENE GLYCOL 3350 17 G PO PACK
17.0000 g | PACK | Freq: Every day | ORAL | Status: DC
  Administered 2021-01-08: 17 g
  Filled 2021-01-06: qty 1

## 2021-01-06 MED ORDER — MAGNESIUM SULFATE 2 GM/50ML IV SOLN
2.0000 g | Freq: Once | INTRAVENOUS | Status: AC
  Administered 2021-01-06: 2 g via INTRAVENOUS
  Filled 2021-01-06: qty 50

## 2021-01-06 MED ORDER — MIDAZOLAM HCL 2 MG/2ML IJ SOLN: 1.0000 mg | INTRAMUSCULAR | Status: DC | PRN

## 2021-01-06 MED ORDER — CYANOCOBALAMIN 1000 MCG/ML IJ SOLN
1000.0000 ug | Freq: Once | INTRAMUSCULAR | Status: AC
  Administered 2021-01-07: 1000 ug via INTRAMUSCULAR
  Filled 2021-01-06 (×2): qty 1

## 2021-01-06 MED ORDER — PROPOFOL 1000 MG/100ML IV EMUL
0.0000 ug/kg/min | INTRAVENOUS | Status: DC
  Administered 2021-01-06: 40 ug/kg/min via INTRAVENOUS
  Administered 2021-01-06: 20 ug/kg/min via INTRAVENOUS
  Administered 2021-01-07: 40 ug/kg/min via INTRAVENOUS
  Administered 2021-01-07: 20 ug/kg/min via INTRAVENOUS
  Filled 2021-01-06 (×3): qty 100

## 2021-01-06 MED ORDER — INSULIN ASPART 100 UNIT/ML IJ SOLN: 0.0000 [IU] | INTRAMUSCULAR | Status: DC

## 2021-01-06 MED ORDER — SODIUM CHLORIDE 3 % IV BOLUS
250.0000 mL | Freq: Once | INTRAVENOUS | Status: AC
  Administered 2021-01-06: 250 mL via INTRAVENOUS
  Filled 2021-01-06: qty 500

## 2021-01-06 MED ORDER — STROKE: EARLY STAGES OF RECOVERY BOOK: Freq: Once | Status: AC

## 2021-01-06 MED ORDER — THIAMINE HCL 100 MG/ML IJ SOLN: 100.0000 mg | Freq: Every day | INTRAMUSCULAR | Status: DC

## 2021-01-06 MED ORDER — LABETALOL HCL 5 MG/ML IV SOLN: 10.0000 mg | INTRAVENOUS | Status: DC | PRN

## 2021-01-06 MED ORDER — SENNOSIDES-DOCUSATE SODIUM 8.6-50 MG PO TABS
1.0000 | ORAL_TABLET | Freq: Two times a day (BID) | ORAL | Status: DC
  Filled 2021-01-06: qty 1

## 2021-01-06 MED ORDER — FENTANYL CITRATE PF 50 MCG/ML IJ SOSY
50.0000 ug | PREFILLED_SYRINGE | INTRAMUSCULAR | Status: AC | PRN
  Administered 2021-01-11 (×3): 50 ug via INTRAVENOUS
  Filled 2021-01-06 (×3): qty 1

## 2021-01-06 MED ORDER — SODIUM CHLORIDE 3 % IV SOLN
INTRAVENOUS | Status: DC
Start: 2021-01-06 — End: 2021-01-08
  Filled 2021-01-06 (×4): qty 500

## 2021-01-06 MED ORDER — ORAL CARE MOUTH RINSE
15.0000 mL | OROMUCOSAL | Status: DC
  Administered 2021-01-06 – 2021-01-28 (×219): 15 mL via OROMUCOSAL

## 2021-01-06 MED ORDER — SODIUM CHLORIDE 0.9 % IV SOLN
1.0000 mg | Freq: Once | INTRAVENOUS | Status: AC
  Administered 2021-01-06: 1 mg via INTRAVENOUS
  Filled 2021-01-06: qty 0.2

## 2021-01-06 MED ORDER — LABETALOL HCL 5 MG/ML IV SOLN
INTRAVENOUS | Status: AC
  Filled 2021-01-06: qty 4

## 2021-01-06 MED ORDER — SUCCINYLCHOLINE CHLORIDE 200 MG/10ML IV SOSY
PREFILLED_SYRINGE | INTRAVENOUS | Status: AC
  Filled 2021-01-06: qty 10

## 2021-01-06 MED ORDER — PANTOPRAZOLE SODIUM 40 MG IV SOLR
40.0000 mg | Freq: Every day | INTRAVENOUS | Status: DC
  Administered 2021-01-06 – 2021-01-09 (×4): 40 mg via INTRAVENOUS
  Filled 2021-01-06 (×4): qty 40

## 2021-01-06 MED ORDER — FOLIC ACID 1 MG PO TABS
1.0000 mg | ORAL_TABLET | Freq: Every day | ORAL | Status: DC
  Administered 2021-01-06 – 2021-01-16 (×11): 1 mg
  Filled 2021-01-06 (×11): qty 1

## 2021-01-06 MED ORDER — FENTANYL CITRATE PF 50 MCG/ML IJ SOSY
100.0000 ug | PREFILLED_SYRINGE | Freq: Once | INTRAMUSCULAR | Status: AC
  Administered 2021-01-06: 100 ug via INTRAVENOUS
  Filled 2021-01-06: qty 2

## 2021-01-06 MED ORDER — DOCUSATE SODIUM 50 MG/5ML PO LIQD
100.0000 mg | Freq: Two times a day (BID) | ORAL | Status: DC
  Administered 2021-01-06 – 2021-01-07 (×2): 100 mg
  Filled 2021-01-06 (×2): qty 10

## 2021-01-06 MED ORDER — CHLORHEXIDINE GLUCONATE 0.12% ORAL RINSE (MEDLINE KIT)
15.0000 mL | Freq: Two times a day (BID) | OROMUCOSAL | Status: DC
  Administered 2021-01-06 – 2021-01-28 (×44): 15 mL via OROMUCOSAL

## 2021-01-06 MED ORDER — ACETAMINOPHEN 650 MG RE SUPP: 650.0000 mg | RECTAL | Status: DC | PRN

## 2021-01-06 MED ORDER — ETOMIDATE 2 MG/ML IV SOLN
INTRAVENOUS | Status: AC
  Administered 2021-01-06: 20 mg
  Filled 2021-01-06: qty 10

## 2021-01-06 MED ORDER — LACTATED RINGERS IV BOLUS (SEPSIS)
500.0000 mL | Freq: Once | INTRAVENOUS | Status: AC
  Administered 2021-01-06: 500 mL via INTRAVENOUS

## 2021-01-06 MED ORDER — PROPOFOL 1000 MG/100ML IV EMUL
INTRAVENOUS | Status: DC
  Filled 2021-01-06 (×2): qty 100

## 2021-01-06 MED ORDER — THIAMINE HCL 100 MG/ML IJ SOLN
500.0000 mg | Freq: Three times a day (TID) | INTRAMUSCULAR | Status: AC
  Administered 2021-01-06 – 2021-01-07 (×3): 500 mg via INTRAVENOUS
  Filled 2021-01-06 (×3): qty 6

## 2021-01-06 MED ORDER — LABETALOL HCL 5 MG/ML IV SOLN
10.0000 mg | INTRAVENOUS | Status: DC | PRN
Start: 2021-01-06 — End: 2021-01-10
  Administered 2021-01-06 – 2021-01-10 (×2): 10 mg via INTRAVENOUS
  Filled 2021-01-06: qty 4

## 2021-01-06 MED ORDER — NALOXONE HCL 0.4 MG/ML IJ SOLN
INTRAMUSCULAR | Status: AC
  Filled 2021-01-06: qty 1

## 2021-01-06 MED ORDER — DEXMEDETOMIDINE HCL IN NACL 400 MCG/100ML IV SOLN
0.2000 ug/kg/h | INTRAVENOUS | Status: DC
  Administered 2021-01-07: 0.2 ug/kg/h via INTRAVENOUS
  Filled 2021-01-06 (×2): qty 100

## 2021-01-06 MED ORDER — NALOXONE HCL 2 MG/2ML IJ SOSY
PREFILLED_SYRINGE | INTRAMUSCULAR | Status: AC
  Administered 2021-01-06: 2 mg
  Filled 2021-01-06: qty 2

## 2021-01-06 MED ORDER — FENTANYL CITRATE PF 50 MCG/ML IJ SOSY
50.0000 ug | PREFILLED_SYRINGE | INTRAMUSCULAR | Status: DC | PRN
  Administered 2021-01-07: 50 ug via INTRAVENOUS
  Filled 2021-01-06 (×3): qty 2

## 2021-01-06 MED ORDER — SODIUM CHLORIDE 0.9 % IV SOLN
INTRAVENOUS | Status: DC | PRN
  Administered 2021-01-06: 500 mL via INTRAVENOUS
  Administered 2021-01-12: 250 mL via INTRAVENOUS

## 2021-01-06 MED ORDER — PROPOFOL 1000 MG/100ML IV EMUL
INTRAVENOUS | Status: AC
  Administered 2021-01-06: 5 ug/kg/min via INTRAVENOUS
  Filled 2021-01-06: qty 100

## 2021-01-06 MED ORDER — ACETAMINOPHEN 160 MG/5ML PO SOLN
650.0000 mg | ORAL | Status: DC | PRN
  Administered 2021-01-07 – 2021-01-27 (×10): 650 mg
  Filled 2021-01-06 (×10): qty 20.3

## 2021-01-06 MED ORDER — ROCURONIUM BROMIDE 10 MG/ML (PF) SYRINGE
PREFILLED_SYRINGE | INTRAVENOUS | Status: AC
  Administered 2021-01-06: 100 mg
  Filled 2021-01-06: qty 10

## 2021-01-06 MED ORDER — CHLORHEXIDINE GLUCONATE CLOTH 2 % EX PADS
6.0000 | MEDICATED_PAD | Freq: Every day | CUTANEOUS | Status: DC
  Administered 2021-01-06 – 2021-01-29 (×28): 6 via TOPICAL

## 2021-01-06 MED ORDER — ADULT MULTIVITAMIN W/MINERALS CH
1.0000 | ORAL_TABLET | Freq: Every day | ORAL | Status: DC
  Administered 2021-01-06 – 2021-01-16 (×11): 1
  Filled 2021-01-06 (×11): qty 1

## 2021-01-06 NOTE — H&P (Addendum)
Neurology Consultation   CC: Found down  History is obtained from: Chart review, EDP and family   HPI: Timothy Good is a 55 y.o. male with a past medical history significant for uncontrolled hypertension complicated by CKD stage IV, heart failure, hyperlipidemia, daily drinking, medication nonadherence, iron deficiency anemia/anemia of chronic disease, presenting with a pontine hemorrhage  Per family he has been complaining of some difficulty swallowing for at least the last few days.  He has also been complaining of some GI discomfort and they note that he lives in [place] and had been unaware of the drinking water advisory until someone spoke to him about it at 4 PM on 10/6.  This was his last known well.  At 7 PM somebody tried to visit him and he did not open the door which was atypical but they thought perhaps he had just gone to bed early that night.  When no one could reach him the next morning still a well check was requested and he was found down and brought to the ED.  On ED arrival he was noted to have pinpoint but reactive pupils, some ability to move the right upper extremity, significant gaze impairment and was not felt to be responsive to voice or pain.  He was emergently intubated and head CT revealed a pontine hemorrhage for which neurology was consulted.  Regarding recent complaints, they note that the patient generally does not take significant care of himself, has been having a lot of headaches for which she uses Tylenol, ibuprofen and perhaps Goody powders.  In addition to not swallowing well for the past several days to 1 week, he has been having a lot of swelling in his hands and feet and general body aches.  He also passes out frequently is dizzy and was noted to have had the heater on quite high in his room today which is atypical for him.  He drinks at least a half a gallon of vodka daily.  They note that he used to get medications refilled by a provider who did not  require him to come to the office for checkups but when his insurance was changed in July 2021 to new primary care physician he stopped being able to receive his medications.    LKW: 4 PM on 10/6 with regards to responsiveness, 10/3 with regards to swallow difficulty tPA given?: No, due to [place]  Premorbid modified rankin scale:      1 - No significant disability. Able to carry out all usual activities, despite some symptoms.  ICH Score: 3  Time performed: 4:45 PM  GCS: 5-12 is 1 point Infratentorial: Yes.  . If yes, 1 point Volume: <30cc is 0 points  Age: 55 y.o.. >80 is 1 point -- 0  Intraventricular extension is 1 point -- 1  A Score of 3 points has a 30 day mortality of 72%. Stroke. 2001 Apr;32(4):891-7.    ROS: Unable to obtain due to intubation and hemorrhagic stroke, obtained from family as above  Past Medical History:  Diagnosis Date   Chronic combined systolic and diastolic CHF (congestive heart failure) ([place])    Echo 06/2010 EF 20-25%, akinesis of the inferior myocardium, severe hypokinesis of the anteroseptal myocardial and grade 3 diastolic dysfunction; Echo 01/2012 mod dilated LV, mod LVH, EF 25-00%, grade 2 diastolic dysfunction, mod LAE, mild RAE    CKD (chronic kidney disease)    stage IV    H/O cardiovascular stress test    [facility]  06/2010 was negative for inducible ischemia with an EF of 14%.   H/O medication noncompliance    due to inability to afford medications   Hypertension    Uncontrolled   Iron deficiency anemia    Protein in urine    3.9 gm Mar 2012; 1.6gm 01/2012   Past Surgical History:  Procedure Laterality Date   none     Current Outpatient Medications  Medication Instructions   amLODipine (NORVASC) 10 mg, Oral, Daily   carvedilol (COREG) 6.25 mg, Oral, 2 times daily with meals   ferrous fumarate (HEMOCYTE - 106 MG FE) 325 (106 FE) MG TABS 106 mg of iron, Oral, Daily   furosemide (LASIX) 40 mg, Oral, 2 times daily   hydrALAZINE  (APRESOLINE) 50 mg, Oral, Every 8 hours   isosorbide mononitrate (IMDUR) 60 mg, Oral, Daily   lisinopril (ZESTRIL) 20 mg, Oral, Daily   lovastatin (MEVACOR) 20 mg, Daily at bedtime  -Not yet confirmed by pharmacy    Current Facility-Administered Medications:     stroke: mapping our early stages of recovery book, , Does not apply, Once, Eustacia Urbanek L, MD   0.9 %  sodium chloride infusion, , Intravenous, PRN, Javion Holmer L, MD, Last Rate: 10 mL/hr at 01/13/2021 2000, Infusion Verify at 01/04/2021 2000   acetaminophen (TYLENOL) tablet 650 mg, 650 mg, Oral, Q4H PRN **OR** acetaminophen (TYLENOL) 160 MG/5ML solution 650 mg, 650 mg, Per Tube, Q4H PRN **OR** acetaminophen (TYLENOL) suppository 650 mg, 650 mg, Rectal, Q4H PRN, Esley Brooking L, MD   chlorhexidine gluconate (MEDLINE KIT) (PERIDEX) 0.12 % solution 15 mL, 15 mL, Mouth Rinse, BID, Bharath Bernstein L, MD, 15 mL at 01/14/2021 1957   Chlorhexidine Gluconate Cloth 2 % PADS 6 each, 6 each, Topical, Daily, Jaelyn Cloninger L, MD, 6 each at 01/29/2021 1706   clevidipine (CLEVIPREX) infusion 0.5 mg/mL, 0-21 mg/hr, Intravenous, Continuous, Marcia Hartwell L, MD, Last Rate: 24 mL/hr at 01/09/2021 2000, 12 mg/hr at 01/20/2021 2000   dexmedetomidine (PRECEDEX) 400 MCG/100ML (4 mcg/mL) infusion, 0.2-0.7 mcg/kg/hr, Intravenous, Continuous, Pauletta Pickney L, MD   docusate (COLACE) 50 MG/5ML liquid 100 mg, 100 mg, Per Tube, BID, Starlene Consuegra L, MD   fentaNYL (SUBLIMAZE) injection 50 mcg, 50 mcg, Intravenous, Q15 min PRN, Ata Pecha L, MD   fentaNYL (SUBLIMAZE) injection 50-200 mcg, 50-200 mcg, Intravenous, Q30 min PRN, Lendora Keys L, MD   folic acid (FOLVITE) tablet 1 mg, 1 mg, Oral, Daily, Marieta Markov L, MD   labetalol (NORMODYNE) injection 10 mg, 10 mg, Intravenous, Q10 min PRN, Rosine Solecki L, MD, 10 mg at 01/09/2021 1640   MEDLINE mouth rinse, 15 mL, Mouth Rinse, 10 times per day, Zeya Balles L, MD, 15 mL at 01/18/2021 1706   midazolam  (VERSED) injection 1-2 mg, 1-2 mg, Intravenous, Q1H PRN, Nelta Caudill L, MD   multivitamin with minerals tablet 1 tablet, 1 tablet, Oral, Daily, Krystal Delduca L, MD   pantoprazole (PROTONIX) injection 40 mg, 40 mg, Intravenous, Daily, Viviana Trimble L, MD, 40 mg at 01/17/2021 1703   polyethylene glycol (MIRALAX / GLYCOLAX) packet 17 g, 17 g, Per Tube, Daily, Larue Drawdy L, MD   propofol (DIPRIVAN) 1000 MG/100ML infusion, 0-50 mcg/kg/min, Intravenous, Continuous, Donita Newland L, MD, Last Rate: 24.3 mL/hr at 01/21/2021 2000, 40 mcg/kg/min at 01/27/2021 2000   senna-docusate (Senokot-S) tablet 1 tablet, 1 tablet, Oral, BID, Ziggy Chanthavong L, MD   sodium chloride (hypertonic) 3 % solution, , Intravenous, Continuous, Dodi Leu L, MD, Last Rate: 50 mL/hr  at 01/15/2021 2000, Infusion Verify at 01/10/2021 2000   thiamine (B-1) injection 500 mg, 500 mg, Intravenous, Q8H, Eeva Schlosser L, MD   Family History  Problem Relation Age of Onset   Heart failure Mother 34       "died of a weak heart from smoking"   Other Other        No known heart disease   Social History: Family reports that he drinks half a gallon of vodka daily at minimum.  Additional social history will need to be reviewed  Exam: Current vital signs: BP (!) 152/112   Pulse (!) 139   Temp 98.3 F (36.8 C) (Axillary)   Resp 16   Ht $R'6\' 2"'BD$  (1.88 m)   Wt 100.7 kg   SpO2 100%   BMI 28.50 kg/m  Vital signs in last 24 hours: Temp:  [98.3 F (36.8 C)] 98.3 F (36.8 C) (10/07 1314) Pulse Rate:  [49-198] 139 (10/07 1538) Resp:  [14-65] 16 (10/07 1538) BP: (142-226)/(102-164) 152/112 (10/07 1540) SpO2:  [91 %-100 %] 100 % (10/07 1538) FiO2 (%):  [60 %-100 %] 60 % (10/07 1531) Weight:  [100.7 kg-101 kg] 100.7 kg (10/07 1448)   Physical Exam  Constitutional: Appears thin, chronically ill Psych: Affect difficult to assess secondary to ET tube and stroke Eyes: No scleral injection. Proptotic, which is new per family,  right worse than left.  Right eye also has some exudate/clouding and is chemotic HENT: No oropharyngeal obstruction.  MSK: no joint deformities.  Cardiovascular: Tachycardic to the 130s initially, improved with labetalol Respiratory: Effort normal, non-labored breathing GI: Soft.  No distension.  Tenderness difficult to assess given acute stroke Skin: Warm dry and intact visible skin  Neuro: Mental Status: Patient is awake, intubation limits language exam but following commands with the RUE (shows two fingers, thumbs, weakly) Cranial Nerves: II: Pupils are equal, round, and reactive to light.  2 mm -> 1 mm reactive, III,IV, VI: EOM w/ occular bobbing, mildly dysconjugate gaze V: Facial sensation corneals bilaterally intact VII: Facial movement is notable for upper face more reactive on the left than the right, lower face limited eval due to ETT VIII: hearing is intact to voice X/XI: Gag present on the right but not the left. No cough  Motor: Slight extensor posturing in the LUE, RUE weakly following commands with the hand, posturing LLE and RLE  Sensory: Less reactive on the left than the right Cerebellar: Unable to assess secondary to patient's brainstem dysfunction    NIHSS total 24 Score breakdown: One-point for drowsiness, 2 points for gaz e, 3 points for no clear blink to threat, 2 points for facial droop, 3 points for left upper extremity weakness, 2 points for right upper extremity weakness, 3 points for left lower extremity weakness, 3 points for right lower extremity weakness, 2 points for apparent sensory loss on the left side, 3 points for language Performed on time of arrival to unit   I have reviewed labs in epic and the results pertinent to this consultation are:  Basic Metabolic Panel: Recent Labs  Lab 12/31/2020 1308 01/27/2021 1411 01/01/2021 1506 01/07/2021 1700  NA 141  --  142 149*  K 3.8  --   --  2.9*  CL 105  --   --   --   CO2 23  --   --   --   GLUCOSE 136*   --   --   --   BUN 34*  --   --   --  CREATININE 3.65*  --   --   --   CALCIUM 9.1  --   --   --   MG  --  1.4*  --   --     CBC: Recent Labs  Lab 01/09/2021 1308 01/05/2021 1700  WBC 10.4  --   NEUTROABS 9.4*  --   HGB 10.9* 9.5*  HCT 33.4* 28.0*  MCV 94.4  --   PLT 251  --     Coagulation Studies: Recent Labs    01/20/2021 1308  LABPROT 13.0  INR 1.0    Last ECHO: 06/16/2015 - Mild LVH with LVEF 55-60%. There has been improvement in LVEF    compared to the previous study in 2013. Grade 1 diastolic    dysfunction with normal estimated LV filling pressure. Upper    normal left atrial chamber size. Mildly thickened mitral leaflets    with trivial mitral regurgitation. Trivial aortic regurgitation.    Trivial tricuspid regurgitation with estimated PASP 25 mmHg.   I have reviewed the images obtained: Head CT w/ pontine hemorrhage 1. Examination is positive for hemorrhagic infarct within the brainstem. 2. Posterior extension of clot into the fourth ventricle and the cisterna magna. 3. Chronic small vessel ischemic disease and brain atrophy   Assessment: This is a 55 year old man with past medical history as noted above, presenting with significant intracerebral hemorrhage with high risk developing hydrocephalus or mortality secondary to other complications  Plan: Brainstem ICH, nontraumatic, IVH, nontraumatic  Acuity: Acute Current suspected etiology: Hypertension, though hemorrhagic conversion of an ischemic stroke is also possible Treatment: -Admit to ICU, stroke team -ICH Score: 3 -ICH Volume: Less than 30 cc -BP control goal SYS<140 -Spoke with Dr. 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  Multivitamin   RESP Acute Respiratory Failure  -vent management per ICU -wean when able, may need tracheostomy  CV Hypertensive Encephalopathy Essential (primary) hypertension Hypertensive Emergency -Aggressive BP control, goal SBP <140 -Titrate oral agents when stabilized  Heart failure, unspecified  Acute systolic (congestive) heart failure  Chronic systolic (congestive) heart failure  Acute on chronic systolic (congestive) heart failure  -TTE -Continue BB -Cards Consult  Tachycardia to the 130s -Goal heart rate less than 120, consider further work-up if persistent  GI/GU CKD Stage 4 (GFR 15-29) -avoid nephrotoxic agents -renal consult  HEME Iron Deficiency Anemia Anemia in CKD -Monitor -transfuse for hgb < 7  ENDO -goal HgbA1c > 7 -Given significant proptosis will check thyroid studies  Fluid/Electrolyte Disorders Hypo Potassium  Hypo Magnesium  -Replete -Repeat labs -Trend -At high risk of derangements given his chronic alcohol use  ID Possible Aspiration PNA -CXR -NPO -Monitor  [place] contaminated drinking water exposure  with reported abdominal pain -Continue to monitor  Nutrition  Concern for malnutrition secondary to alcohol use -diet consult  Prophylaxis DVT: SCDs due to [place] GI: Pantoprazole Bowel: Senna and  docusate  Dispo: Pending clinical course Diet: NPO until cleared by speech  Code Status: Full Code.  Goals of care discussion was initiated with family but I was interrupted by an emergent consult for patient's status.  I did attempt to revisit the family after my shift was over but could not find them in the room.  I additionally attempted to call but there was no answer.  Goals of care will need to be formally addressed with family  THE FOLLOWING WERE PRESENT ON ADMISSION: CNS -  Acute hemorrhagic stroke, Hypertensive Encephalopathy, Cerebral Herniation, Cerebral Edema, ICH, quadriparesis,  Respiratory - Ventilator dependent, Respiratory failure,  Cardiovascular - Acute Chronic Systolic Diastolic CHF,  Infectious - possible GI infection GI - Peptic ulcer disease, GI bleeding  Renal -  CKD, hypomagnesemia, hypokalemia Heme-  Anemia  Lesleigh Noe MD-PhD [facility] [phone]  Total critical care time: 70 minutes   Critical care time was exclusive of separately billable procedures and treating other patients.   Critical care was necessary to treat or prevent imminent or life-threatening deterioration.   Critical care was time spent personally by me on the following activities: development of treatment plan with patient and/or surrogate as well as nursing, discussions with consultants/primary team, evaluation of patient's response to treatment, examination of patient, obtaining history from patient or surrogate, ordering and performing treatments and interventions, ordering and review of laboratory studies, ordering and review of radiographic studies, and re-evaluation of patient's condition as needed, as documented above.

## 2021-01-06 NOTE — ED Triage Notes (Signed)
Pt brought in by [facility] from home after a welfare check was called on pt. Pt unresponsive upon EMS arrival. Pt's BP 192/101 and O2 sat 100% for EMS.

## 2021-01-06 NOTE — Procedures (Signed)
Central Venous Catheter Insertion Procedure Note  Timothy Good  FC:[id number]  12/13/1965  Date:01/29/2021  Time:5:57 PM   Provider Performing:Zacchaeus Halm Cleon Dew   Procedure: Insertion of Non-tunneled Central Venous [phone]) with US guidance [id number])   Indication(s) Medication administration  Consent Risks of the procedure as well as the alternatives and risks of each were explained to the patient and/or caregiver.  Consent for the procedure was obtained and is signed in the bedside chart  Anesthesia Topical only with 1% lidocaine   Timeout Verified patient identification, verified procedure, site/side was marked, verified correct patient position, special equipment/implants available, medications/allergies/relevant history reviewed, required imaging and test results available.  Sterile Technique Maximal sterile technique including full sterile barrier drape, hand hygiene, sterile gown, sterile gloves, mask, hair covering, sterile ultrasound probe cover (if used).  Procedure Description Area of catheter insertion was cleaned with chlorhexidine and draped in sterile fashion.  With real-time ultrasound guidance a central venous catheter was placed into the left internal jugular vein. Nonpulsatile blood flow and easy flushing noted in all ports.  The catheter was sutured in place and sterile dressing applied.      Complications/Tolerance None; patient tolerated the procedure well. Chest X-ray is ordered to verify placement for internal jugular or subclavian cannulation.   Chest x-ray is not ordered for femoral cannulation.  EBL Minimal  Specimen(s) None  [employer] MSN, APRN, AGACNP-BC [facility]  01/02/2021 , 5:57 PM  Please see [email or web address] for pager details  If no response, please call [phone] After hours, please call Elink at [phone]

## 2021-01-06 NOTE — ED Provider Notes (Addendum)
[facility] EMERGENCY DEPARTMENT Provider Note   CSN: [id number] Arrival date & time: 01/25/2021  1242     History No chief complaint on file.   [name] is a 55 y.o. male.  Level 5 caveat secondary to unresponsive.  Last known well was last night.  Patient had wellness check today was found unresponsive.  EMS placed on nonrebreather.  Fingerstick was okay.  Seems to have some use of right arm.  Patient is opening closing eyes but does not appear to have any real coordinated gaze.  Not responsive to voice or pain.  The history is provided by the EMS personnel.  Altered Mental Status Presenting symptoms: unresponsiveness   Severity:  Severe Progression:  Unchanged Chronicity:  New Associated symptoms: eye deviation       Past Medical History:  Diagnosis Date   Chronic combined systolic and diastolic CHF (congestive heart failure) ([place])    Echo 06/2010 EF 20-25%, akinesis of the inferior myocardium, severe hypokinesis of the anteroseptal myocardial and grade 3 diastolic dysfunction; Echo 01/2012 mod dilated LV, mod LVH, EF [id number], grade 2 diastolic dysfunction, mod LAE, mild RAE    CKD (chronic kidney disease)    stage IV    H/O cardiovascular stress test    Lexiscan Myoview 06/2010 was negative for inducible ischemia with an EF of 14%.   H/O medication noncompliance    due to inability to afford medications   Hypertension    Uncontrolled   Iron deficiency anemia    Protein in urine    3.9 gm Mar 2012; 1.6gm 01/2012    Patient Active Problem List   Diagnosis Date Noted   Protein in urine    Hypertensive crisis 01/23/2012   Acute on chronic combined systolic and diastolic heart failure ([place]) 01/23/2012   Acute on chronic renal failure ([place]) 01/23/2012   Noncompliance with medication regimen 01/23/2012    Past Surgical History:  Procedure Laterality Date   none         Family History  Problem Relation Age of Onset   Heart failure Mother 53       "died of a  weak heart from smoking"   Other Other        No known heart disease    Social History   Tobacco Use   Smoking status: Never   Smokeless tobacco: Never  Substance Use Topics   Alcohol use: No   Drug use: No    Home Medications Prior to Admission medications   Medication Sig Start Date End Date Taking? Authorizing Provider  amLODipine (NORVASC) 10 MG tablet Take 1 tablet (10 mg total) by mouth daily. 01/27/12   Hope, Jessica A, PA-C  carvedilol (COREG) 6.25 MG tablet Take 1 tablet (6.25 mg total) by mouth 2 (two) times daily with a meal. 01/27/12   Hope, Jessica A, PA-C  ferrous fumarate (HEMOCYTE - 106 MG FE) 325 (106 FE) MG TABS Take 1 tablet (106 mg of iron total) by mouth daily. 01/27/12   Hope, Jessica A, PA-C  furosemide (LASIX) 40 MG tablet Take 1 tablet (40 mg total) by mouth 2 (two) times daily. 01/27/12   Hope, Jessica A, PA-C  hydrALAZINE (APRESOLINE) 50 MG tablet Take 1 tablet (50 mg total) by mouth every 8 (eight) hours. 01/27/12   Hope, Jessica A, PA-C  isosorbide mononitrate (IMDUR) 60 MG 24 hr tablet Take 1 tablet (60 mg total) by mouth daily. 06/08/15   Herminio Commons, MD  lisinopril (PRINIVIL,ZESTRIL)  20 MG tablet Take 1 tablet (20 mg total) by mouth daily. 01/27/12   Hope, Jessica A, PA-C  lovastatin (MEVACOR) 20 MG tablet Take 20 mg by mouth at bedtime.    [provider]    Allergies    Penicillins  Review of Systems   Review of Systems  Unable to perform ROS: Mental status change   Physical Exam Updated Vital Signs BP (!) 222/122   Pulse (!) 49   Temp 98.3 F (36.8 C) (Axillary)   Resp 18   Ht '6\' 2"'$  (1.88 m)   Wt 100.7 kg   SpO2 91%   BMI 28.50 kg/m   Physical Exam Vitals and nursing note reviewed.  Constitutional:      General: He is in acute distress.     Appearance: Normal appearance. He is well-developed and normal weight.  HENT:     Head: Normocephalic and atraumatic.     Mouth/Throat:     Comments: Significant thick oral  secretions poor dentition and loose teeth Eyes:     Conjunctiva/sclera: Conjunctivae normal.     Comments: Right eye open bulging and appears very dry.  Left eye also appears slightly bulging but eyelid closed.  Pupils are 1 mm right 2 mm left.  Cardiovascular:     Rate and Rhythm: Normal rate and regular rhythm.     Heart sounds: No murmur heard. Pulmonary:     Effort: Pulmonary effort is normal. No respiratory distress.     Breath sounds: Rhonchi present.  Abdominal:     Palpations: Abdomen is soft.     Tenderness: There is no abdominal tenderness.  Musculoskeletal:     Cervical back: Neck supple.  Skin:    General: Skin is warm and dry.  Neurological:     Mental Status: He is unresponsive.     GCS: GCS eye subscore is 4. GCS verbal subscore is 1. GCS motor subscore is 5.     Comments: Patient will squeeze right hand, no other spontaneous movements noted.    ED Results / Procedures / Treatments   Labs (all labs ordered are listed, but only abnormal results are displayed) Labs Reviewed  LACTIC ACID, PLASMA - Abnormal; Notable for the following components:      Result Value   Lactic Acid, Venous 2.0 (*)    All other components within normal limits  COMPREHENSIVE METABOLIC PANEL - Abnormal; Notable for the following components:   Glucose, Bld 136 (*)    BUN 34 (*)    Creatinine, Ser 3.65 (*)    Albumin 3.3 (*)    GFR, Estimated 19 (*)    All other components within normal limits  CBC WITH DIFFERENTIAL/PLATELET - Abnormal; Notable for the following components:   RBC 3.54 (*)    Hemoglobin 10.9 (*)    HCT 33.4 (*)    Neutro Abs 9.4 (*)    Lymphs Abs 0.5 (*)    All other components within normal limits  URINALYSIS, ROUTINE W REFLEX MICROSCOPIC - Abnormal; Notable for the following components:   Glucose, UA 50 (*)    Hgb urine dipstick SMALL (*)    Protein, ur >=300 (*)    All other components within normal limits  BRAIN NATRIURETIC PEPTIDE - Abnormal; Notable for the  following components:   B Natriuretic Peptide 214.0 (*)    All other components within normal limits  BLOOD GAS, VENOUS - Abnormal; Notable for the following components:   pO2, Ven 54.4 (*)  All other components within normal limits  MAGNESIUM - Abnormal; Notable for the following components:   Magnesium 1.4 (*)    All other components within normal limits  TROPONIN I (HIGH SENSITIVITY) - Abnormal; Notable for the following components:   Troponin I (High Sensitivity) 24 (*)    All other components within normal limits  RESP PANEL BY RT-PCR (FLU A&B, COVID) [id number]  URINE CULTURE  CULTURE, BLOOD (ROUTINE X 2)  CULTURE, BLOOD (ROUTINE X 2)  PROTIME-INR  APTT  LIPASE, BLOOD  RAPID URINE DRUG SCREEN, HOSP PERFORMED  LACTIC ACID, PLASMA  ETHANOL  TSH  BLOOD GAS, ARTERIAL  SODIUM  SODIUM  SODIUM  I-STAT CHEM 8, ED  TROPONIN I (HIGH SENSITIVITY)    EKG EKG Interpretation  Date/Time:  Friday January 06 2021 12:50:37 EDT Ventricular Rate:  75 PR Interval:  140 QRS Duration: 92 QT Interval:  431 QTC Calculation: 482 R Axis:   18 Text Interpretation: Sinus rhythm Multiple ventricular premature complexes Probable left atrial enlargement LVH with secondary repolarization abnormality Borderline prolonged QT interval increased ectopy since prior 10/13 Confirmed by Aletta Edouard [phone]) on 01/24/2021 1:14:40 PM  Radiology CT Head Wo Contrast  Result Date: 12/31/2020 CLINICAL DATA:  Mental status change.  Found unresponsive. EXAM: CT HEAD WITHOUT CONTRAST TECHNIQUE: Contiguous axial images were obtained from the base of the skull through the vertex without intravenous contrast. COMPARISON:  None. FINDINGS: Brain: There is a hyperdense mass within the brainstem measuring 2.8 by 2.1 by 2.6 cm compatible with hemorrhagic infarct. There is surrounding low attenuation edema. Posterior extension of clot is identified into the fourth ventricle and the cisterna magna, image 70/2. decreased CSF  space is noted at the cerebellar pontine angle. There is moderate patchy low-attenuation within the subcortical and periventricular white matter compatible with chronic microvascular disease. Prominence of the sulci identified compatible with brain atrophy. Vascular: No hyperdense vessel or unexpected calcification. Skull: Normal. Negative for fracture or focal lesion. Sinuses/Orbits: Mild mucosal thickening is noted involving the maxillary sinuses. Mastoid air cells are clear. Other: None. IMPRESSION: 1. Examination is positive for hemorrhagic infarct within the brainstem. 2. Posterior extension of clot into the fourth ventricle and the cisterna magna. 3. Chronic small vessel ischemic disease and brain atrophy. Critical Value/emergent results were called by telephone at the time of interpretation on 01/28/2021 at 2:41 pm to provider [facility] , who verbally acknowledged these results. Electronically Signed   By: Kerby Moors M.D.   On: 01/27/2021 14:42   DG Chest Port 1 View  Result Date: 01/07/2021 CLINICAL DATA:  A 55 year old male presents with sepsis now intubated. EXAM: PORTABLE CHEST 1 VIEW COMPARISON:  No recent comparisons, most recent prior is from October of 2013. FINDINGS: EKG leads project over the chest. A gastric tube courses through in off the field of the radiograph. Side port is below GE junction, tip is not visualized. Endotracheal tube at 2.5 cm from the carina. Cardiomediastinal contours are normal. RIGHT hemidiaphragm is elevated. This is slightly accentuated when compared to prior imaging but there is no sign of consolidation, effusion or pneumothorax. On limited assessment there is no acute skeletal process. Incidental note is made of what appears to be in hearing projecting over the RIGHT supraclavicular region and therefore more likely external to the patient. IMPRESSION: Endotracheal tube is at 2.5 cm from the carina. No acute cardiopulmonary process. Gastric tube courses through  off the field of the radiograph. And earring projects over the RIGHT supraclavicular region, may  be anterior or posterior to the patient. Electronically Signed   By: Zetta Bills M.D.   On: 01/15/2021 14:07    Procedures Procedure Name: Intubation Date/Time: 01/19/2021 1:12 PM Performed by: Hayden Rasmussen, MD Pre-anesthesia Checklist: Patient identified, Patient being monitored, Emergency Drugs available, Timeout performed and Suction available Oxygen Delivery Method: Non-rebreather mask Preoxygenation: Pre-oxygenation with 100% oxygen Induction Type: Rapid sequence Ventilation: Mask ventilation without difficulty Laryngoscope Size: Glidescope and 3 Grade View: Grade III Tube size: 8.0 mm Number of attempts: 2 Placement Confirmation: ETT inserted through vocal cords under direct vision, CO2 detector and Breath sounds checked- equal and bilateral Secured at: 26 cm Tube secured with: ETT holder Dental Injury: Teeth and Oropharynx as per pre-operative assessment  Difficulty Due To: Difficulty was anticipated Comments: Patient initially intubated with 7.5 tube.  Respiratory was concerned that the balloon might of ruptured and so we extubated and reintubated with an 8.0.  Continues to be very rhonchorous.  Poor dentition multiple loose teeth prior to placement and similar after.  No bleeding.    .Critical Care Performed by: Hayden Rasmussen, MD Authorized by: Hayden Rasmussen, MD   Critical care provider statement:    Critical care time (minutes):  90   Critical care time was exclusive of:  Separately billable procedures and treating other patients   Critical care was necessary to treat or prevent imminent or life-threatening deterioration of the following conditions:  CNS failure or compromise   Critical care was time spent personally by me on the following activities:  Discussions with consultants, evaluation of patient's response to treatment, examination of patient, ordering and  performing treatments and interventions, ordering and review of laboratory studies, ordering and review of radiographic studies, pulse oximetry, re-evaluation of patient's condition, obtaining history from patient or surrogate, review of old charts and development of treatment plan with patient or surrogate   I assumed direction of critical care for this patient from another provider in my specialty: no     Medications Ordered in ED Medications  propofol (DIPRIVAN) 1000 MG/100ML infusion ( Intravenous Rate/Dose Change 01/14/2021 1330)  clevidipine (CLEVIPREX) infusion 0.5 mg/mL (4 mg/hr Intravenous Rate/Dose Change 01/05/2021 1512)  sodium chloride 3% (hypertonic) IV bolus 250 mL (has no administration in time range)  sodium chloride (hypertonic) 3 % solution (has no administration in time range)  naloxone (NARCAN) 2 MG/2ML injection (2 mg  Given by Other 01/18/2021 1247)  rocuronium bromide 100 MG/10ML SOSY (100 mg  Given by Other 01/01/2021 1258)  etomidate (AMIDATE) 2 MG/ML injection (20 mg  Given by Other 01/25/2021 1257)  lactated ringers bolus 500 mL (0 mLs Intravenous Stopped 01/13/2021 1450)    ED Course  I have reviewed the triage vital signs and the nursing notes.  Pertinent labs & imaging results that were available during my care of the patient were reviewed by me and considered in my medical decision making (see chart for details).  Clinical Course as of 01/29/2021 1732  Ludwig Clarks Jan 06, 2021  1337 Nurse tells me that the daughter-in-law reports that the patient has not been taking his meds for at least a year. [MB]  19 Daughter tells me patient drinks fairly heavily.  History of hypertension kidney disease heart disease [MB]  1358 Chest x-ray showing elevated right hemidiaphragm.  ET tube in good position.  No clear infiltrates. [MB]  [id number] Received a call from radiology patient has a hemorrhagic infarct of his brainstem.  Neurology has been paged. [MB]  [id number] Discussed  with Dr. Curly Shores from neurology.   She is recommending immediate blood pressure control with Cleviprex and also giving 3% saline for possible herniation.  [facility] will be transporting ED to ED.  Accepted by Dr. Billy Fischer.  Patient's ex-wife at bedside now. [MB]    Clinical Course User Index [MB] Hayden Rasmussen, MD   MDM Rules/Calculators/A&P                          This patient complains of unresponsive episode; this involves an extensive number of treatment Options and is a complaint that carries with it a high risk of complications and Morbidity. The differential includes bleed, stroke, herniation, intoxication, seizure, sepsis, hypoglycemia  I ordered, reviewed and interpreted labs, which included CBC with a white count hemoglobin low not critical, chemistries with CKD not significantly changed from priors, lactic acid elevated, troponins mildly elevated will need to be trended, urinalysis without signs of infection I ordered medication intubation medications, fluids, Narcan, propofol for sedation, Cleviprex for hypertensive I ordered imaging studies which included chest x-ray and head CT and I independently    visualized and interpreted imaging which showed acute hemorrhagic brainstem Additional history obtained from EMS Previous records obtained and reviewed in epic no recent visits I consulted neurology Dr. Curly Shores and discussed lab and imaging findings  Critical Interventions: Intubation and management of patient's acute hemorrhagic stroke with IV medication  After the interventions stated above, I reevaluated the patient and found patient's blood pressure started to respond.  He does have a little bit of spontaneous movement in his right arm and right leg.   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

## 2021-01-06 NOTE — Consult Note (Addendum)
NAME:  Timothy Good, MRN:  [id number], DOB:  Jul 01, 1965, LOS: 0 ADMISSION DATE:  01/05/2021, CONSULTATION DATE:  01/26/2021 REFERRING MD:  Curly Shores, CHIEF COMPLAINT:  found down   History of Present Illness:  55 y/o male with multiple medical problems was found down by in a well call visit by police and was brought to [facility] with a diagnosis of pontine hemorrhage.  He was intubated for airway protection and then transferred to [facility] for further evaluation.  Pulmonary and critical care medicine was consulted for medical management in the setting of acute hemorrhagic stroke.  The patient was unable to provide history because he was intubated.  Pertinent  Medical History  CKD stage IV Hypertension Fe Def anemia Chronic systolic and diastolic heart failure   Significant Hospital Events: Including procedures, antibiotic start and stop dates in addition to other pertinent events   January 06, 2021 admission, intubation  Imaging: January 06, 2021 CT head showed pontine hemorrhage, chronic small vessel ischemic disease and brain atrophy, posterior extension of the clot into the fourth ventricle and the cisterna magna   Interim History / Subjective:  As above  Objective   Blood pressure (!) 153/103, pulse 95, temperature 98.3 F (36.8 C), temperature source Axillary, resp. rate 20, height '5\' 11"'$  (1.803 m), weight 70.7 kg, SpO2 100 %.    Vent Mode: PRVC FiO2 (%):  [60 %-100 %] 60 % Set Rate:  [12 bmp-18 bmp] 12 bmp Vt Set:  [500 mL] 500 mL PEEP:  [5 cmH20] 5 cmH20 Plateau Pressure:  [13 cmH20-15 cmH20] 15 cmH20   Intake/Output Summary (Last 24 hours) at 01/21/2021 1656 Last data filed at 01/07/2021 1632 Gross per 24 hour  Intake 319.71 ml  Output --  Net 319.71 ml   Filed Weights   01/24/2021 1315 01/24/2021 1448 01/03/2021 1647  Weight: 101 kg 100.7 kg 70.7 kg    Examination: General:  In bed on vent HENT: NCAT ETT in place PULM: CTA B, vent supported  breathing CV: RRR, no mgr GI: BS+, soft, nontender MSK: normal bulk and tone Neuro: no gag, no cough, corneal present, able to show two fingers and wiggle toes on command on R, no movement on left   Resolved Hospital Problem list     Assessment & Plan:  Acute hemorrhagic stroke: Pontine, based on physical exam has some evidence of brainstem injury but still has intact motor function on the right Stat neurosurgery consultation for IVC drain Blood pressure control: Goal less than 140/90 Cleviprex for blood pressure goal As needed labetalol for blood pressure goal Hypertonic saline to reduce brain swelling, order set has been placed by neurology, goal sodium 1 50-1 55 Hemorrhagic stroke order set per primary service Monitor glucose: goal 140-180 PT, OT, SLP evaluation per neurology  Acute respiratory failure with hypoxemia due to inability protect airway from acute hemorrhagic pontine stroke Continue full mechanical ventilatory support Repeat ABG on current settings Continue current settings for now though we will repeat ABG to ensure there is no impending hypercarbia on adjusted ventilator settings  Chronic kidney disease stage IV, at baseline Monitor BMET and UOP Replace electrolytes as needed   Hypertension: Management as above  Acute encephalopathy due to stroke, at risk for locked-in syndrome Need for sedation for mechanical ventilation compliance Interventions as above PAD protocol, RASS goal 0 to -1, propofol infusion and as needed fentanyl   Best Practice (right click and "Reselect all SmartList Selections" daily)   Diet/type: tubefeeds  DVT prophylaxis: SCD GI prophylaxis: H2B Lines: N/A Foley:  Yes, and it is still needed Code Status:  full code Last date of multidisciplinary goals of care discussion [per primary]  Labs   CBC: Recent Labs  Lab 01/25/2021 1308  WBC 10.4  NEUTROABS 9.4*  HGB 10.9*  HCT 33.4*  MCV 94.4  PLT [id number]    Basic Metabolic  Panel: Recent Labs  Lab 01/04/2021 1308 01/16/2021 1411 01/02/2021 1506  NA 141  --  142  K 3.8  --   --   CL 105  --   --   CO2 23  --   --   GLUCOSE 136*  --   --   BUN 34*  --   --   CREATININE 3.65*  --   --   CALCIUM 9.1  --   --   MG  --  1.4*  --    GFR: Estimated Creatinine Clearance: 23.4 mL/min (A) (by C-G formula based on SCr of 3.65 mg/dL (H)). Recent Labs  Lab 01/27/2021 1308 01/19/2021 1506  WBC 10.4  --   LATICACIDVEN 2.0* 1.7    Liver Function Tests: Recent Labs  Lab 01/10/2021 1308  AST 31  ALT 15  ALKPHOS 71  BILITOT 0.8  PROT 7.6  ALBUMIN 3.3*   Recent Labs  Lab 01/10/2021 1308  LIPASE 28   No results for input(s): AMMONIA in the last 168 hours.  ABG    Component Value Date/Time   PHART 7.440 01/18/2021 1455   PCO2ART 35.3 01/04/2021 1455   PO2ART 247 (H) 01/14/2021 1455   HCO3 24.6 01/30/2021 1455   TCO2 21 05/31/2010 1507   ACIDBASEDEF 0.1 01/16/2021 1455   O2SAT 99.2 01/27/2021 1455     Coagulation Profile: Recent Labs  Lab 01/19/2021 1308  INR 1.0    Cardiac Enzymes: No results for input(s): CKTOTAL, CKMB, CKMBINDEX, TROPONINI in the last 168 hours.  HbA1C: No results found for: HGBA1C  CBG: No results for input(s): GLUCAP in the last 168 hours.  Review of Systems:   Cannot obtain due to intubation  Past Medical History:  He,  has a past medical history of Chronic combined systolic and diastolic CHF (congestive heart failure) ([place]), CKD (chronic kidney disease), H/O cardiovascular stress test, H/O medication noncompliance, Hypertension, Iron deficiency anemia, and Protein in urine.   Surgical History:   Past Surgical History:  Procedure Laterality Date   none       Social History:   reports that he has never smoked. He has never used smokeless tobacco. He reports that he does not drink alcohol and does not use drugs.   Family History:  His family history includes Heart failure (age of onset: 10) in his mother; Other in an  other family member.   Allergies Allergies  Allergen Reactions   Penicillins Other (See Comments)    Lost mobility in legs for a week.      Home Medications  Prior to Admission medications   Medication Sig Start Date End Date Taking? Authorizing Provider  amLODipine (NORVASC) 10 MG tablet Take 1 tablet (10 mg total) by mouth daily. Patient not taking: No sig reported 01/27/12   Hope, Jessica A, PA-C  carvedilol (COREG) 6.25 MG tablet Take 1 tablet (6.25 mg total) by mouth 2 (two) times daily with a meal. Patient not taking: No sig reported 01/27/12   Hope, Jessica A, PA-C  ferrous fumarate (HEMOCYTE - 106 MG FE) 325 (106 FE) MG TABS Take  1 tablet (106 mg of iron total) by mouth daily. Patient not taking: No sig reported 01/27/12   Hope, Jessica A, PA-C  furosemide (LASIX) 40 MG tablet Take 1 tablet (40 mg total) by mouth 2 (two) times daily. Patient not taking: No sig reported 01/27/12   Hope, Jessica A, PA-C  hydrALAZINE (APRESOLINE) 50 MG tablet Take 1 tablet (50 mg total) by mouth every 8 (eight) hours. Patient not taking: No sig reported 01/27/12   Hope, Jessica A, PA-C  isosorbide mononitrate (IMDUR) 60 MG 24 hr tablet Take 1 tablet (60 mg total) by mouth daily. Patient not taking: No sig reported 06/08/15   Herminio Commons, MD  lisinopril (PRINIVIL,ZESTRIL) 20 MG tablet Take 1 tablet (20 mg total) by mouth daily. Patient not taking: No sig reported 01/27/12   Hope, Jessica A, PA-C  lovastatin (MEVACOR) 20 MG tablet Take 20 mg by mouth at bedtime. Patient not taking: No sig reported    [provider]     Critical care time: 35 minutes    Roselie Awkward, MD [place] [facility] Pager: [phone] Cell: [phone] After 7:00 pm call Elink  [phone]

## 2021-01-06 NOTE — Progress Notes (Signed)
Received pt already intubated, from [facility].  Pt placed on vent settings as previously charted, until MD can eval pt.  Sat 100%, pt tol well currently.

## 2021-01-06 NOTE — ED Notes (Addendum)
Pt intubated by Dr. Melina Copa with 8.0 ETT tube with glidescope. Positive color change on CO2 detector. Secured at 26cm at the lip. Breath sounds heard bilaterally by RT.

## 2021-01-06 NOTE — ED Notes (Signed)
Pharmacy to bring 3% NaCl. Called to confirm

## 2021-01-07 ENCOUNTER — Inpatient Hospital Stay (HOSPITAL_COMMUNITY): Payer: Medicaid Other

## 2021-01-07 DIAGNOSIS — G936 Cerebral edema: Secondary | ICD-10-CM | POA: Diagnosis not present

## 2021-01-07 DIAGNOSIS — I6389 Other cerebral infarction: Secondary | ICD-10-CM

## 2021-01-07 DIAGNOSIS — I613 Nontraumatic intracerebral hemorrhage in brain stem: Secondary | ICD-10-CM | POA: Diagnosis not present

## 2021-01-07 DIAGNOSIS — J9601 Acute respiratory failure with hypoxia: Secondary | ICD-10-CM

## 2021-01-07 LAB — RENAL FUNCTION PANEL
Albumin: 2.5 g/dL — ABNORMAL LOW (ref 3.5–5.0)
Anion gap: 10 (ref 5–15)
BUN: 27 mg/dL — ABNORMAL HIGH (ref 6–20)
CO2: 21 mmol/L — ABNORMAL LOW (ref 22–32)
Calcium: 8.9 mg/dL (ref 8.9–10.3)
Chloride: 118 mmol/L — ABNORMAL HIGH (ref 98–111)
Creatinine, Ser: 3.59 mg/dL — ABNORMAL HIGH (ref 0.61–1.24)
GFR, Estimated: 19 mL/min — ABNORMAL LOW (ref >60)
Glucose, Bld: 113 mg/dL — ABNORMAL HIGH (ref 70–99)
Phosphorus: 4.1 mg/dL (ref 2.5–4.6)
Potassium: 2.8 mmol/L — ABNORMAL LOW (ref 3.5–5.1)
Sodium: 149 mmol/L — ABNORMAL HIGH (ref 135–145)

## 2021-01-07 LAB — TRIGLYCERIDES: Triglycerides: 315 mg/dL — ABNORMAL HIGH (ref <150)

## 2021-01-07 LAB — SODIUM
Sodium: 153 mmol/L — ABNORMAL HIGH (ref 135–145)
Sodium: 153 mmol/L — ABNORMAL HIGH (ref 135–145)
Sodium: 158 mmol/L — ABNORMAL HIGH (ref 135–145)

## 2021-01-07 LAB — I-STAT CHEM 8, ED
BUN: 38 mg/dL — ABNORMAL HIGH (ref 6–20)
Calcium, Ion: 1.02 mmol/L — ABNORMAL LOW (ref 1.15–1.40)
Chloride: 108 mmol/L (ref 98–111)
Creatinine, Ser: 3.5 mg/dL — ABNORMAL HIGH (ref 0.61–1.24)
Glucose, Bld: 118 mg/dL — ABNORMAL HIGH (ref 70–99)
HCT: 35 % — ABNORMAL LOW (ref 39.0–52.0)
Hemoglobin: 11.9 g/dL — ABNORMAL LOW (ref 13.0–17.0)
Potassium: 3.3 mmol/L — ABNORMAL LOW (ref 3.5–5.1)
Sodium: 142 mmol/L (ref 135–145)
TCO2: 23 mmol/L (ref 22–32)

## 2021-01-07 LAB — CBC
HCT: 29.4 % — ABNORMAL LOW (ref 39.0–52.0)
Hemoglobin: 9 g/dL — ABNORMAL LOW (ref 13.0–17.0)
MCH: 29.6 pg (ref 26.0–34.0)
MCHC: 30.6 g/dL (ref 30.0–36.0)
MCV: 96.7 fL (ref 80.0–100.0)
Platelets: 189 10*3/uL (ref 150–400)
RBC: 3.04 MIL/uL — ABNORMAL LOW (ref 4.22–5.81)
RDW: 14.5 % (ref 11.5–15.5)
WBC: 8.8 10*3/uL (ref 4.0–10.5)
nRBC: 0 % (ref 0.0–0.2)

## 2021-01-07 LAB — VITAMIN B12: Vitamin B-12: 213 pg/mL (ref 180–914)

## 2021-01-07 LAB — ECHOCARDIOGRAM COMPLETE
Area-P 1/2: 3.42 cm2
Height: 74 in
S' Lateral: 3.7 cm
Weight: 2493.84 oz

## 2021-01-07 LAB — GLUCOSE, CAPILLARY
Glucose-Capillary: 108 mg/dL — ABNORMAL HIGH (ref 70–99)
Glucose-Capillary: 111 mg/dL — ABNORMAL HIGH (ref 70–99)
Glucose-Capillary: 119 mg/dL — ABNORMAL HIGH (ref 70–99)
Glucose-Capillary: 120 mg/dL — ABNORMAL HIGH (ref 70–99)
Glucose-Capillary: 124 mg/dL — ABNORMAL HIGH (ref 70–99)
Glucose-Capillary: 129 mg/dL — ABNORMAL HIGH (ref 70–99)

## 2021-01-07 LAB — HEMOGLOBIN A1C
Hgb A1c MFr Bld: 5.4 % (ref 4.8–5.6)
Mean Plasma Glucose: 108.28 mg/dL

## 2021-01-07 LAB — TSH: TSH: 0.264 u[IU]/mL — ABNORMAL LOW (ref 0.350–4.500)

## 2021-01-07 LAB — MAGNESIUM: Magnesium: 2.2 mg/dL (ref 1.7–2.4)

## 2021-01-07 IMAGING — CT CT ABD-PELV W/O CM
2 of 4 series · 15 of 46 positions shown, 17 images · non-contrast
Comparison: [DATE] abdominal radiograph.

CLINICAL DATA: Inpatient. Acute nonlocalized abdominal pain.
Pontine hemorrhage.

EXAM:
CT ABDOMEN AND PELVIS WITHOUT CONTRAST
TECHNIQUE: Multidetector CT imaging of the abdomen and pelvis was performed
following the standard protocol without IV contrast.

[Series 5: ap without · axial · non-contrast · 0.77mm/px · z∈[-1027,-537]mm · 12 of 110 slices shown, 14 images]
[im 6/110  soft-tissue]
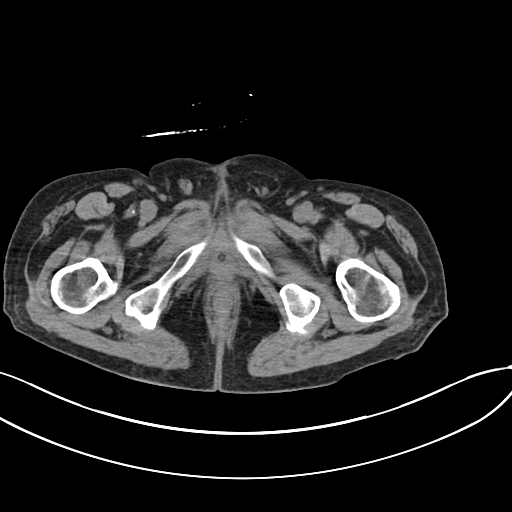
[im 6/110  bone]
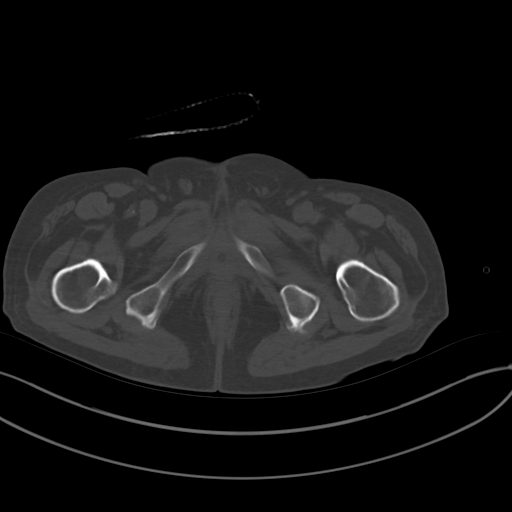
[im 18/110  soft-tissue]
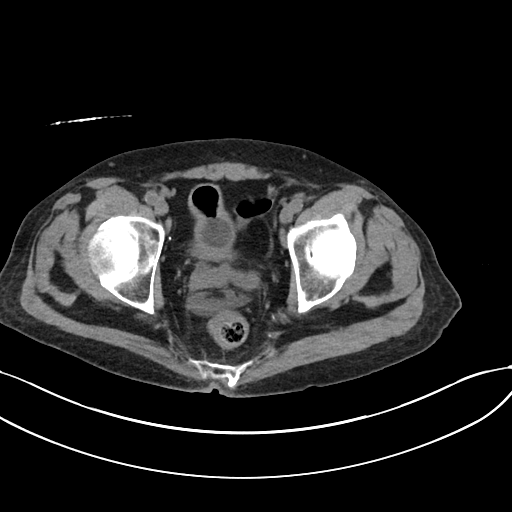
[im 23/110  soft-tissue]
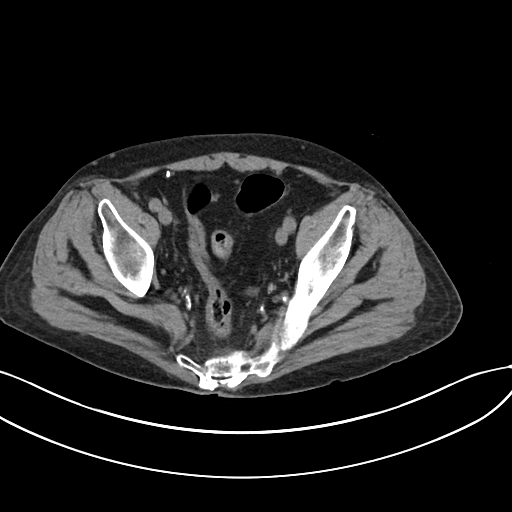
[im 35/110  soft-tissue]
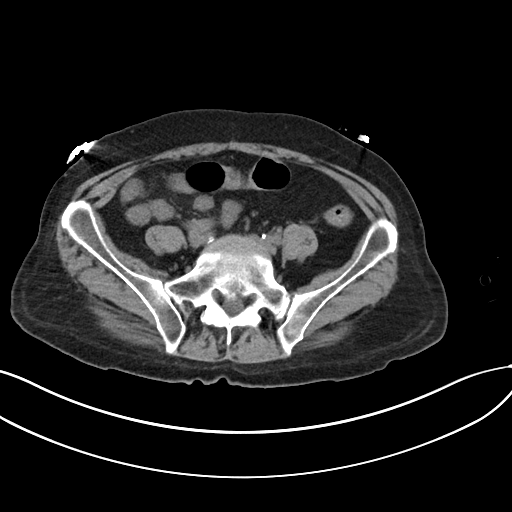
[im 41/110  soft-tissue]
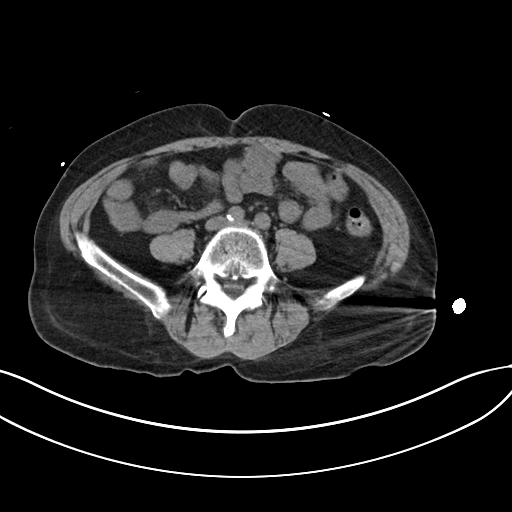
[im 52/110  soft-tissue]
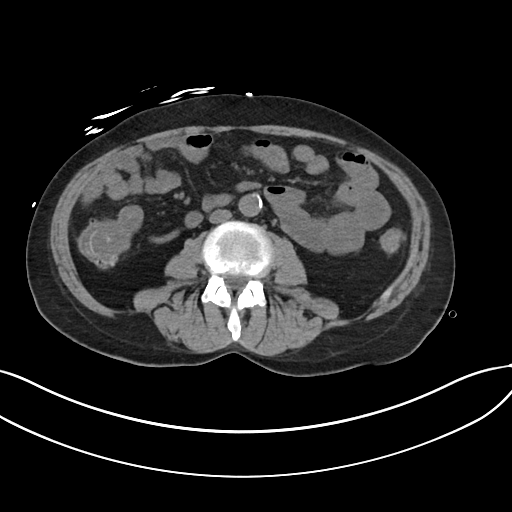
[im 58/110  soft-tissue]
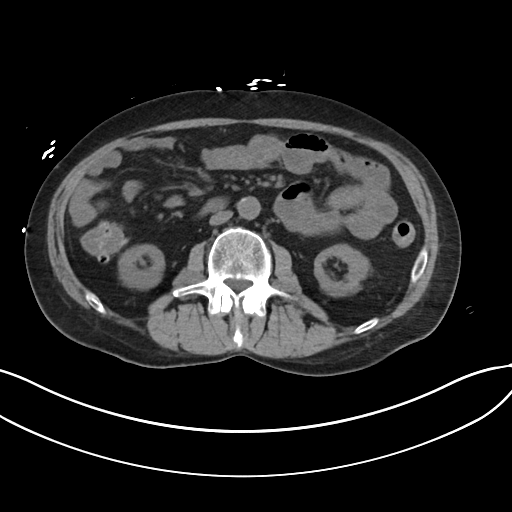
[im 69/110  soft-tissue]
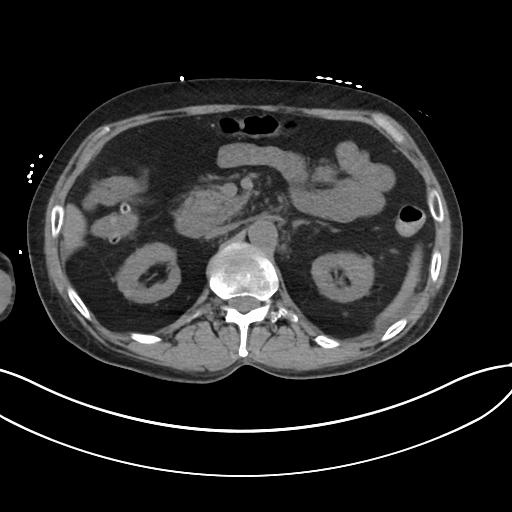
[im 75/110  soft-tissue]
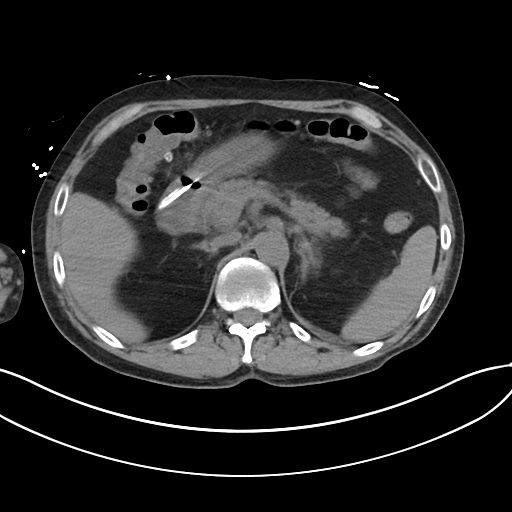
[im 75/110  bone]
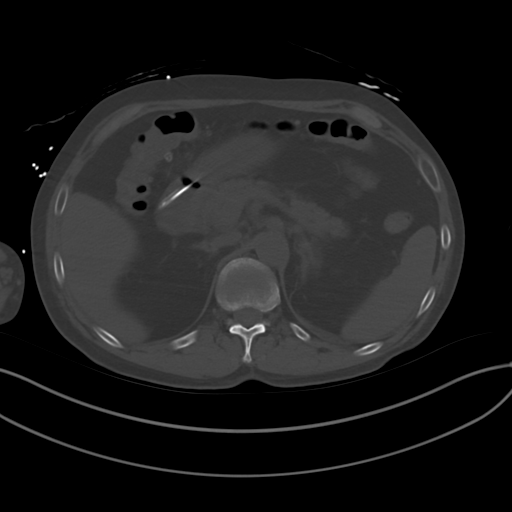
[im 87/110  soft-tissue]
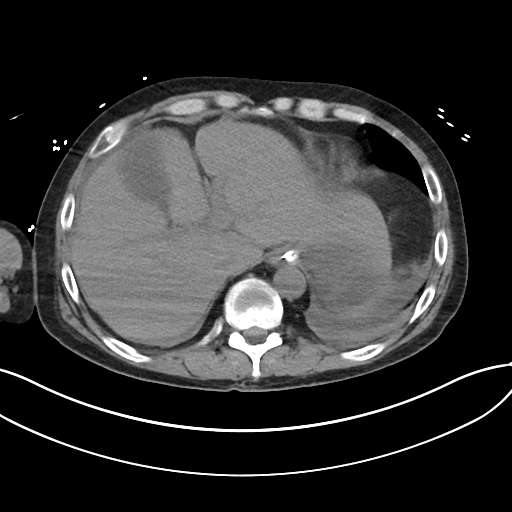
[im 92/110  soft-tissue]
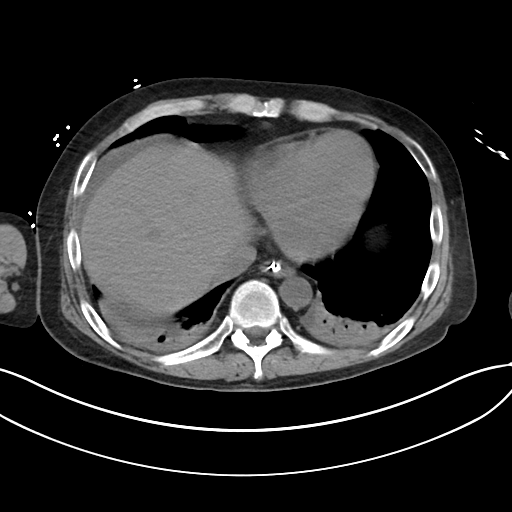
[im 104/110  soft-tissue]
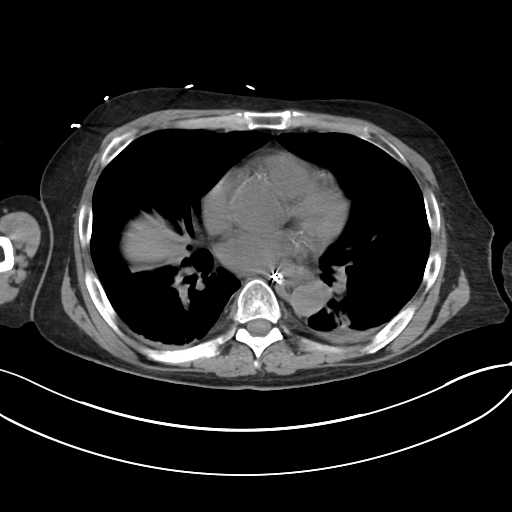

[Series 8: cor · coronal · 0.82mm/px · 3 of 84 slices shown]
[im 28/84  soft-tissue]
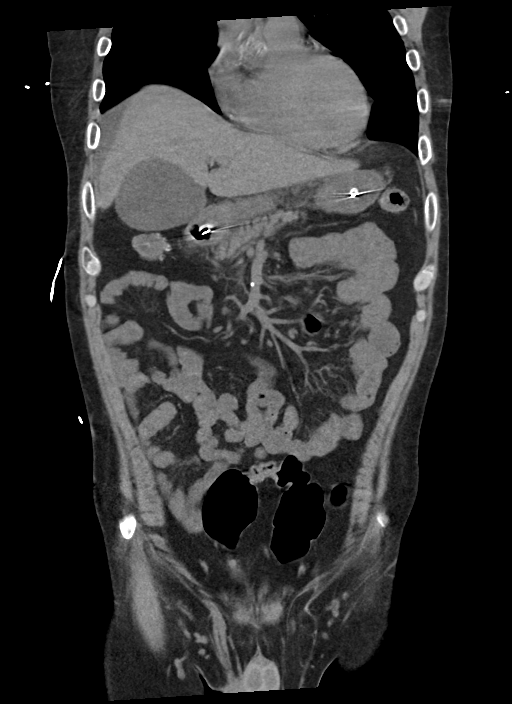
[im 37/84  soft-tissue]
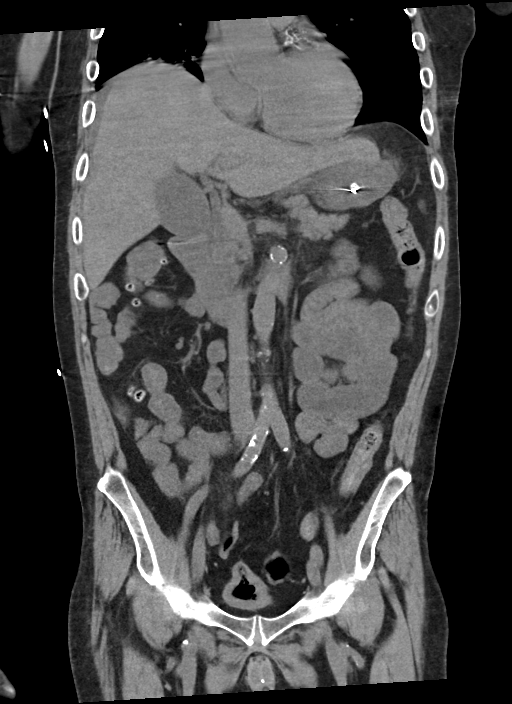
[im 47/84  soft-tissue]
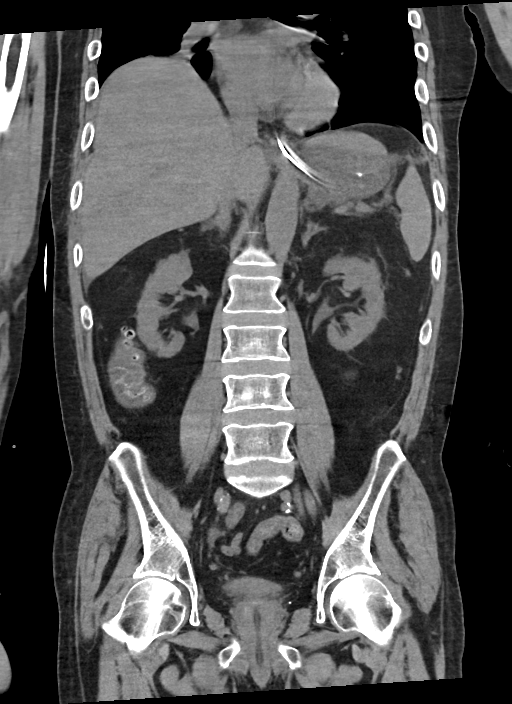

[15 of 46 positions shown; findings below may reference images not displayed]

FINDINGS: Lower chest: Mild to moderate dependent bibasilar atelectasis.
Coronary atherosclerosis.

Hepatobiliary: Normal liver size. No liver mass. Prominently
distended (6.4 cm diameter) gallbladder. No gallbladder wall
thickening. No pericholecystic fluid. No radiopaque cholelithiasis.
No biliary ductal dilatation. CBD diameter 5 mm.

Pancreas: No pancreatic duct dilation or discrete pancreatic mass.
Mild peripancreatic fat stranding at the pancreatic head. No
peripancreatic fluid collections.

Spleen: Normal size. No mass.

Adrenals/Urinary Tract: No discrete adrenal nodules. No
hydronephrosis. No renal stones. Simple 1.8 cm lower right renal
cyst. Scattered subcentimeter hyperdense and hypodense bilateral
renal cortical lesions are too small to characterize and require no
follow-up. Bladder completely collapsed by indwelling Foley catheter
with expected gas in the nondependent bladder lumen.

Stomach/Bowel: Enteric tube terminates in duodenal bulb. Stomach is
collapsed. Suggestion of generalized gastric wall thickening, poorly
assessed on this scan, with associated fat stranding in the
gastrohepatic ligament. Normal caliber small bowel with no small
bowel wall thickening. Normal appendix. Moderate diffuse colonic
diverticulosis, with no large bowel wall thickening or significant
pericolonic fat stranding.

Vascular/Lymphatic: Atherosclerotic nonaneurysmal abdominal aorta.
No pathologically enlarged lymph nodes in the abdomen or pelvis.

Reproductive: Normal size prostate.

Other: No pneumoperitoneum. Small volume ascites, predominantly
perihepatic. No focal fluid collections. Symmetric mild-to-moderate
bilateral gynecomastia.

Musculoskeletal: No aggressive appearing focal osseous lesions. Mild
thoracolumbar spondylosis. Chronic appearing mild T10 vertebral
compression fracture.
IMPRESSION: 1. Suggestion of generalized gastric wall thickening, poorly
assessed on this scan due to gastric decompression by enteric tube,
with associated fat stranding in the gastrohepatic ligament.
Findings are nonspecific and could be due to noninflammatory edema,
gastritis, peptic ulcer disease or neoplasm. Consider upper
endoscopic correlation as clinically warranted.
2. Small volume ascites, predominantly perihepatic.
3. Prominently distended gallbladder. No radiopaque cholelithiasis.
No gallbladder wall thickening. No pericholecystic fluid. No biliary
ductal dilatation.
4. Moderate diffuse colonic diverticulosis.
5. Mild to moderate dependent bibasilar atelectasis.
6. Coronary atherosclerosis.
7. Chronic appearing mild T10 vertebral compression fracture.
8. Aortic Atherosclerosis ([V6]-[V6]).

## 2021-01-07 IMAGING — MR MR ORBITS W/O CM
8 of 12 series · 28 of 48 positions shown · non-contrast
Comparison: Plain head CT [DATE].

CLINICAL DATA: 55-year-old male with altered mental status, found
unresponsive. Brainstem hemorrhage. Ophthalmoplegia.

EXAM:
MRI HEAD AND ORBITS WITHOUT CONTRAST
TECHNIQUE: Multiplanar, multi-echo pulse sequences of the brain and surrounding
structures were acquired without intravenous contrast. Multiplanar,
multi-echo pulse sequences of the orbits and surrounding structures
were acquired including fat saturation techniques, without
intravenous contrast administration.

[Series 5: T1 · axial · non-contrast · 3.0mm · 0.37mm/px · z∈[-102,-4]mm · 3 of 25 slices shown (1 of 2)]
[im 1/25]
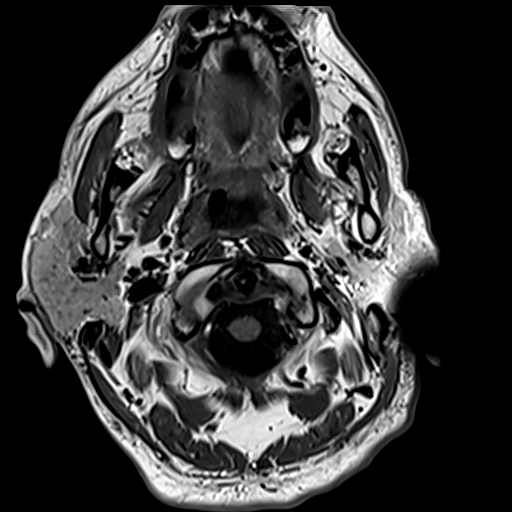
[im 13/25]
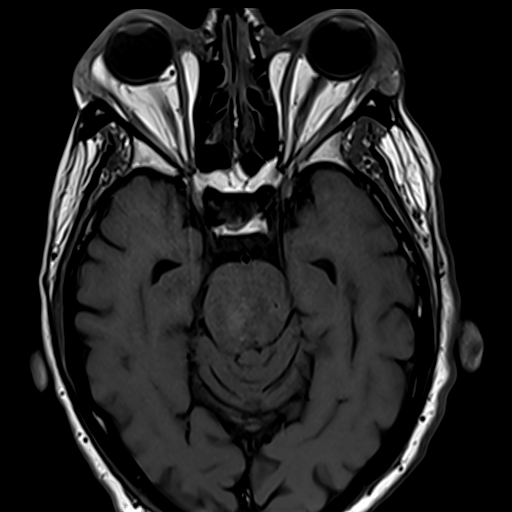
[im 25/25]
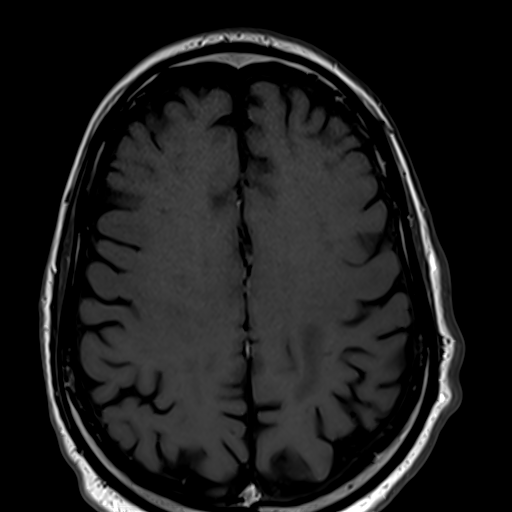

[Series 6: T2 fat-sat · axial · 3.0mm · 0.54mm/px · z∈[-102,-4]mm · 3 of 25 slices shown (1 of 6)]
[im 1/25]
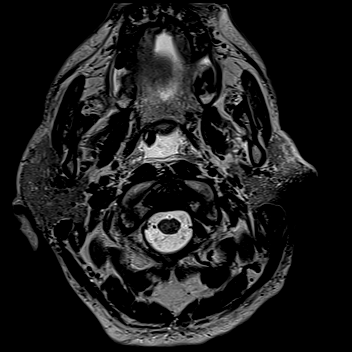
[im 13/25]
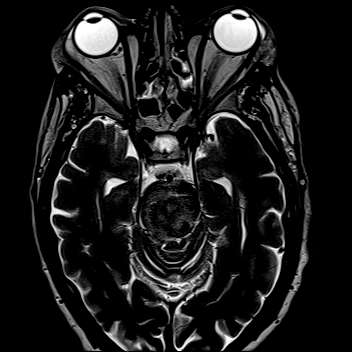
[im 25/25]
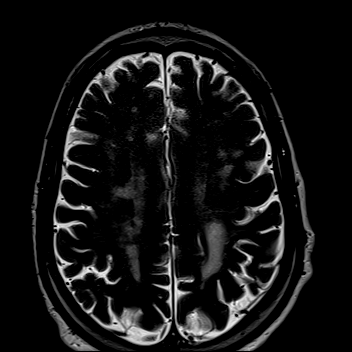

[Series 7: T2 fat-sat · axial · 3.0mm · 0.54mm/px · z∈[-102,-4]mm · 3 of 25 slices shown (2 of 6)]
[im 1/25]
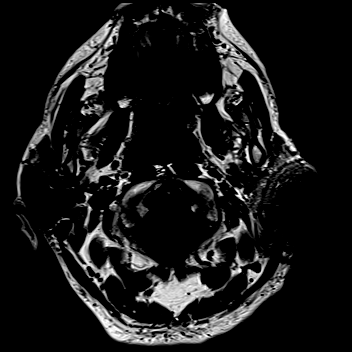
[im 13/25]
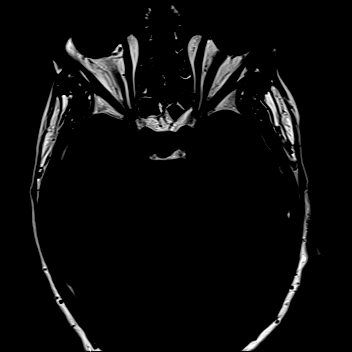
[im 25/25]
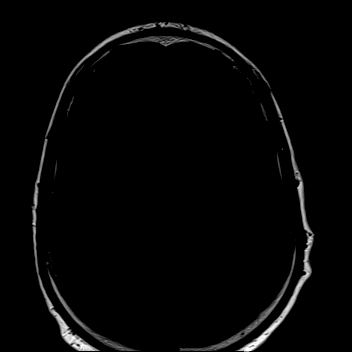

[Series 8: T2 fat-sat · axial · 3.0mm · 0.54mm/px · z∈[-102,-4]mm · 3 of 25 slices shown (3 of 6)]
[im 1/25]
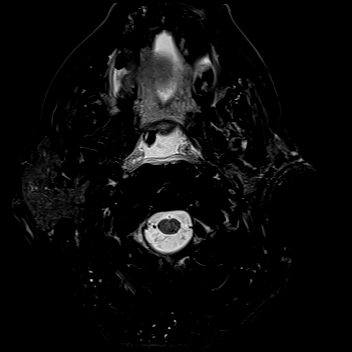
[im 13/25]
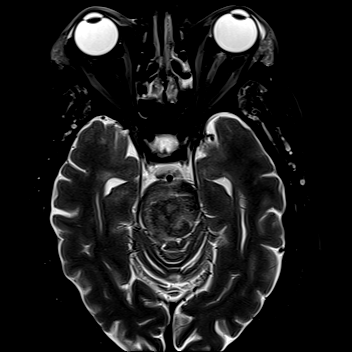
[im 25/25]
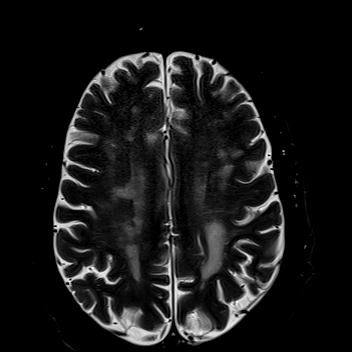

[Series 9: T2 fat-sat · coronal · 3.0mm · 0.54mm/px · 4 of 28 slices shown (4 of 6)]
[im 1/28]
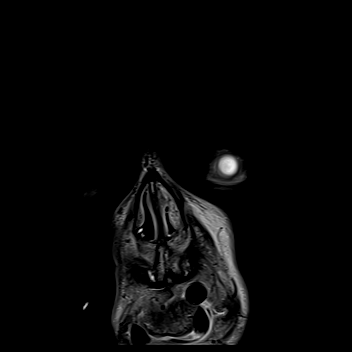
[im 10/28]
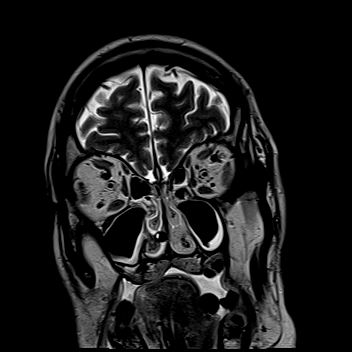
[im 19/28]
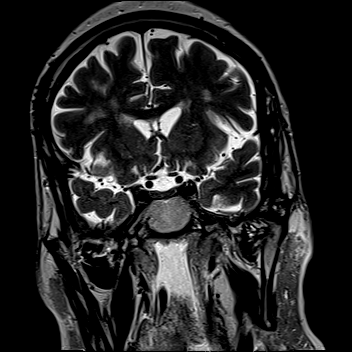
[im 28/28]
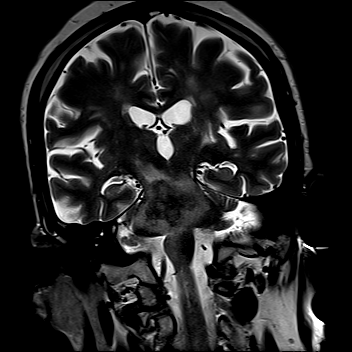

[Series 10: T2 fat-sat · coronal · 3.0mm · 0.54mm/px · 4 of 28 slices shown (5 of 6)]
[im 1/28]
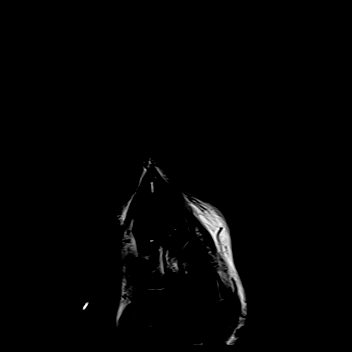
[im 10/28]
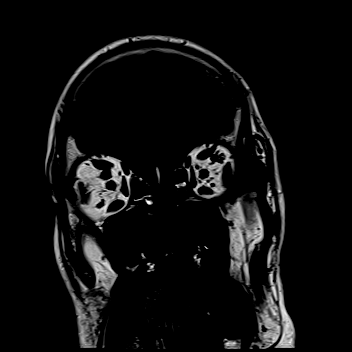
[im 19/28]
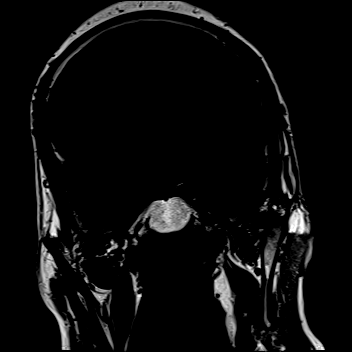
[im 28/28]
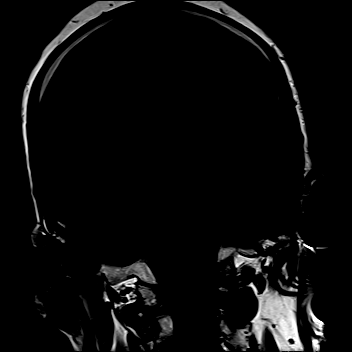

[Series 11: T2 fat-sat · coronal · 3.0mm · 0.54mm/px · 4 of 28 slices shown (6 of 6)]
[im 1/28]
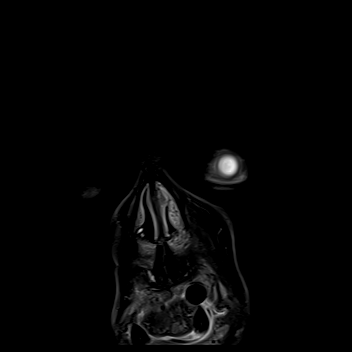
[im 10/28]
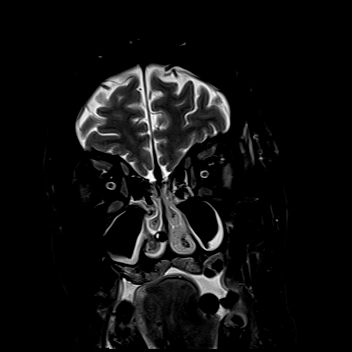
[im 19/28]
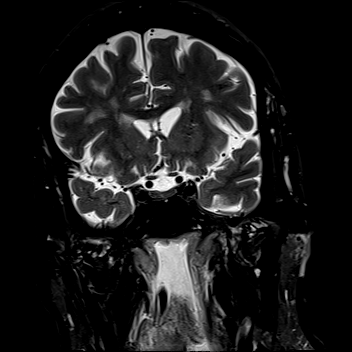
[im 28/28]
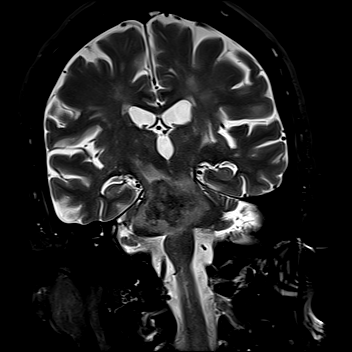

[Series 12: T1 · coronal · 3.0mm · 0.37mm/px · 4 of 28 slices shown (2 of 2)]
[im 1/28]
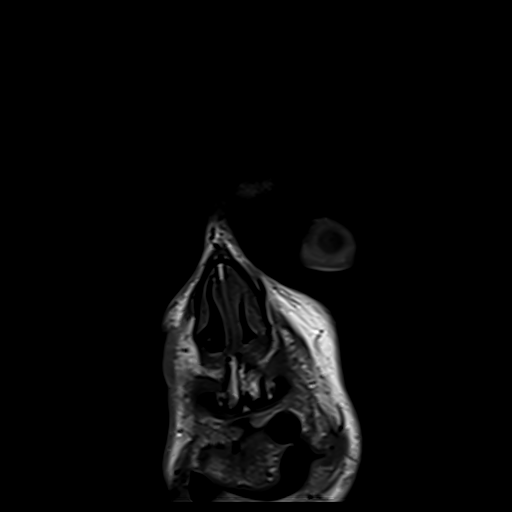
[im 10/28]
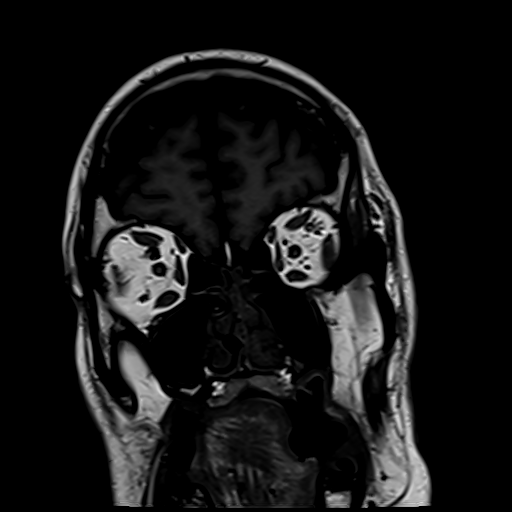
[im 19/28]
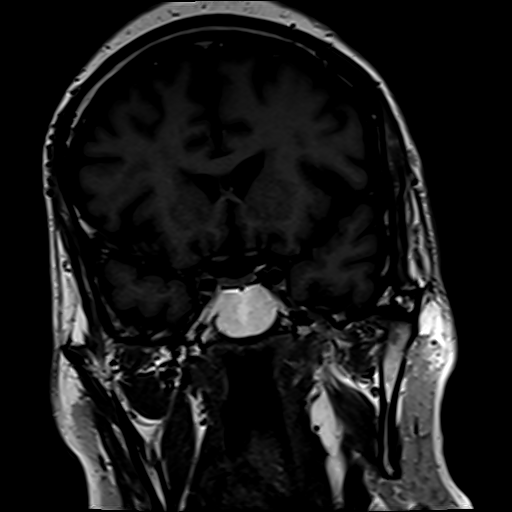
[im 28/28]
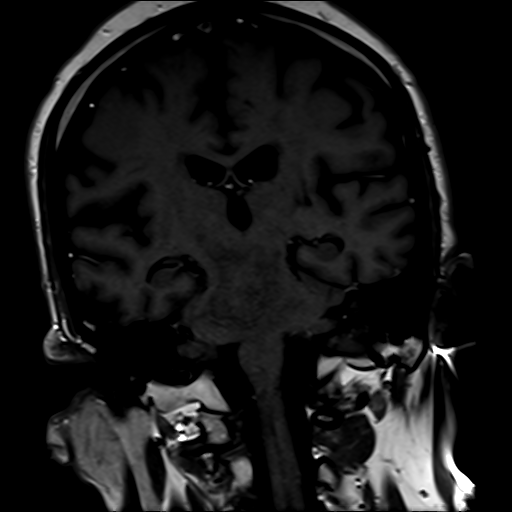

[28 of 48 positions shown; findings below may reference images not displayed]

FINDINGS: MRI HEAD FINDINGS

Brain: Heterogeneous T2 and largely isointense T1 hemorrhage within
the brainstem is expanding the pons and as seen by SWI tracks into
the 4th ventricle. Estimated intra-axial hemorrhage size by MRI is
24 x 29 by 28 mm (AP by transverse by CC) for an estimated blood
volume of 10 mL. Extensive pontine edema. On the dedicated orbit
images edema is seen to track into the right medullary pyramid
(series 8, image 7 of that exam). Edema also tracks cephalad into
the midbrain greater on the right.

Fourth ventricle size remains relatively normal. Punctate blood
layering in the left occipital horn but no other lateral or 3rd
intraventricular hemorrhage. No ventriculomegaly. No definite
transependymal edema. Small volume of subarachnoid hemorrhage in the
cisterna magna.

SWI also demonstrates chronic microhemorrhages scattered in the
cerebellum (especially on the left series 15, image 15) and
bilateral deep gray matter nuclei (image 34. Occasional cerebral
hemisphere microhemorrhages (image 35 on the left).

Basilar cisterns remain patent.  No midline shift.

DWI demonstrates no larger area of diffusion restriction.

No chronic cortical encephalomalacia but moderate Patchy and
confluent bilateral cerebral white matter T2 and FLAIR
hyperintensity in a pattern suggestive of chronic small vessel
ischemia.

Vascular: Major intracranial vascular flow voids are preserved.

Skull and upper cervical spine: Normal bone marrow signal. Negative
visible cervical spine.

Other: Mastoids are clear. Grossly normal visible internal auditory
structures. Intubated. Right nasoenteric tube in place. Fluid in the
pharynx.

MRI ORBITS FINDINGS

Orbits: Normal suprasellar cistern and optic chiasm. Optic nerves
appear symmetric, within normal limits. No contrast administered.
But there is no intraorbital fat stranding or inflammation
identified. No intraorbital mass or mass effect identified. Globes
appear normal. Unremarkable noncontrast cavernous sinus.

Visualized sinuses: Scattered paranasal sinus fluid and mucosal
thickening in the setting of intubation and right nasoenteric tube.

Soft tissues: Negative visible deep soft tissue spaces of the face.
IMPRESSION: 1. Acute brainstem hemorrhage centered at the pons with estimated
intra-axial blood volume of 10 mL.
Extensive pontine edema, which tracks associated edema which tracks
into the midbrain right > left and into the right medullary pyramid.

2. Mild extension of hemorrhage into the 4th ventricle and basilar
subarachnoid spaces. But no ventriculomegaly. No midline shift or
impending herniation.

3. Underlying advanced chronic small vessel disease including
multiple chronic microhemorrhages in the cerebellum and bilateral
deep gray matter nuclei. Advanced bilateral cerebral white matter
ischemia.

4. Negative noncontrast MRI appearance of the orbits.

## 2021-01-07 IMAGING — MR MR HEAD W/O CM
12 of 17 series · 34 of 48 positions shown · non-contrast
Comparison: Plain head CT [DATE].

CLINICAL DATA: 55-year-old male with altered mental status, found
unresponsive. Brainstem hemorrhage. Ophthalmoplegia.

EXAM:
MRI HEAD AND ORBITS WITHOUT CONTRAST
TECHNIQUE: Multiplanar, multi-echo pulse sequences of the brain and surrounding
structures were acquired without intravenous contrast. Multiplanar,
multi-echo pulse sequences of the orbits and surrounding structures
were acquired including fat saturation techniques, without
intravenous contrast administration.

[Series 5: DWI · axial · 3.0mm · 0.88mm/px · z∈[-111,+41]mm · 5 of 106 slices shown (1 of 4)]
[im 1/106]
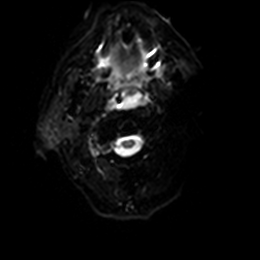
[im 27/106]
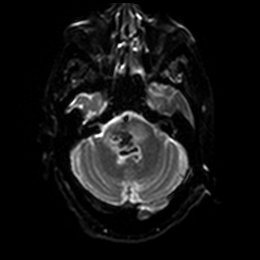
[im 53/106]
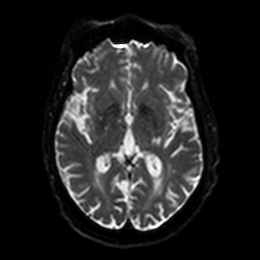
[im 79/106]
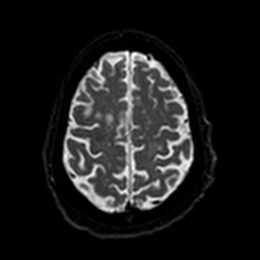
[im 106/106]
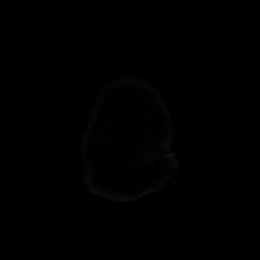

[Series 6: DWI · axial · 3.0mm · 0.88mm/px · z∈[-111,+41]mm · 2 of 53 slices shown (2 of 4)]
[im 1/53]
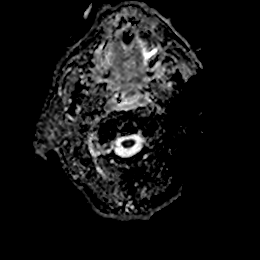
[im 53/53]
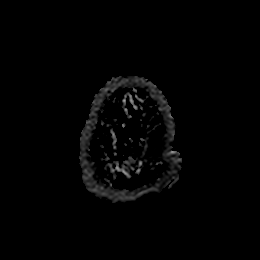

[Series 7: DWI · coronal · 4.0mm · 0.88mm/px · 3 of 76 slices shown (3 of 4)]
[im 1/76]
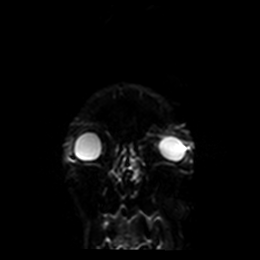
[im 38/76]
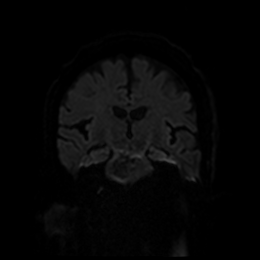
[im 76/76]
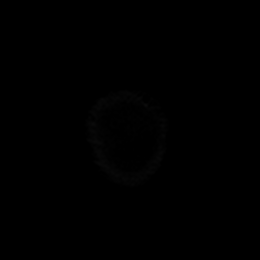

[Series 8: DWI · coronal · 4.0mm · 0.88mm/px · 2 of 38 slices shown (4 of 4)]
[im 1/38]
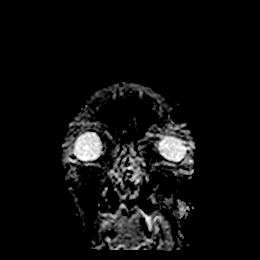
[im 38/38]
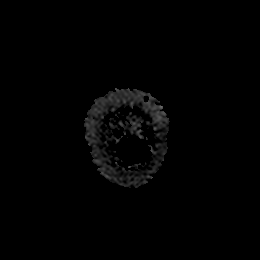

[Series 9: T1 · sagittal · 5.0mm · 0.75mm/px · 1 of 25 slices shown]
[im 1/25]
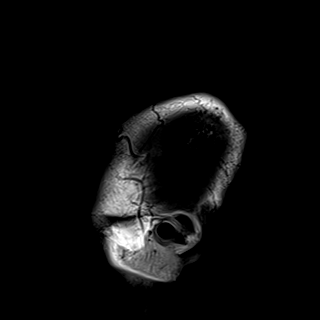

[Series 10: T2 · axial · 5.0mm · 0.72mm/px · z∈[-120,+43]mm · 2 of 29 slices shown]
[im 1/29]
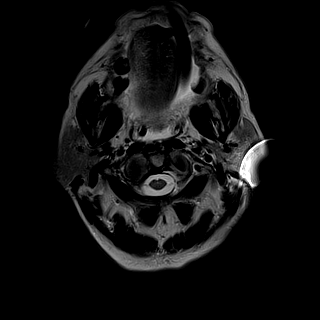
[im 29/29]
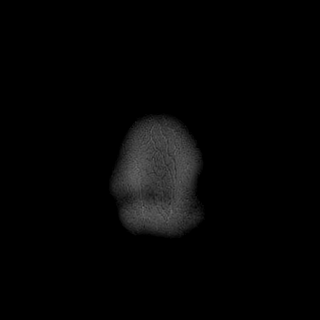

[Series 11: FLAIR · axial · 5.0mm · 0.45mm/px · z∈[-117,+46]mm · 2 of 29 slices shown]
[im 1/29]
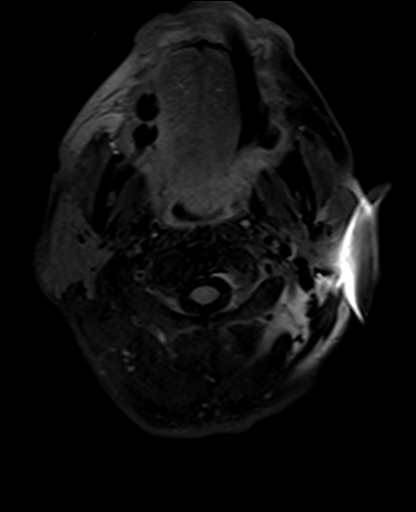
[im 29/29]
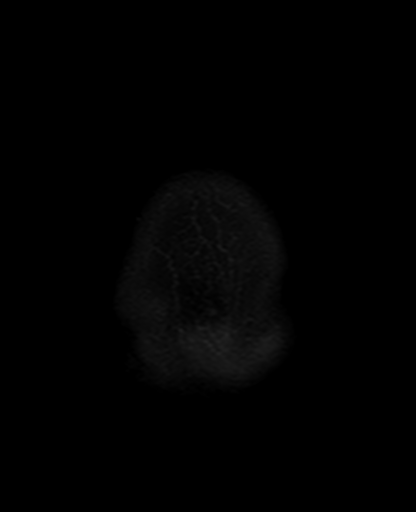

[Series 13: mag_images · axial · 3.0mm · 0.90mm/px · z∈[-122,+50]mm · 4 of 60 slices shown]
[im 1/60]
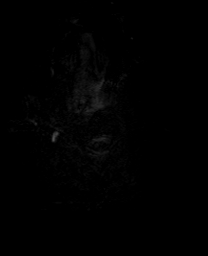
[im 20/60]
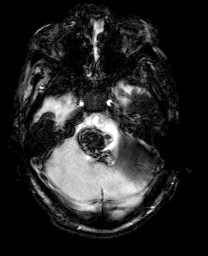
[im 40/60]
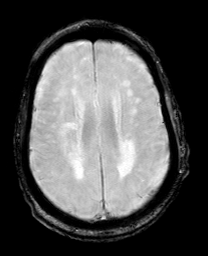
[im 60/60]
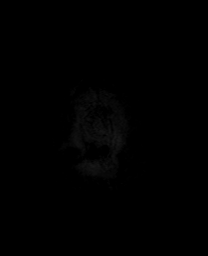

[Series 14: pha_images · axial · 3.0mm · 0.90mm/px · z∈[-122,+50]mm · 4 of 60 slices shown]
[im 1/60]
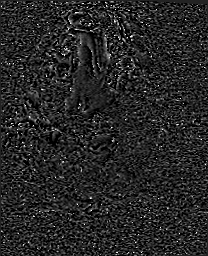
[im 20/60]
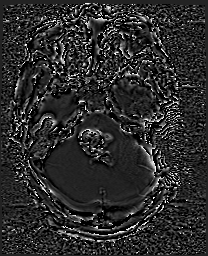
[im 40/60]
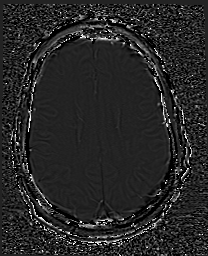
[im 60/60]
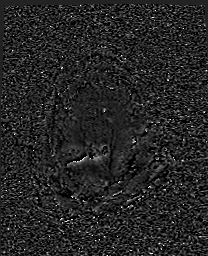

[Series 15: swi_images · axial · 3.0mm · 0.90mm/px · z∈[-122,+50]mm · 4 of 60 slices shown]
[im 1/60]
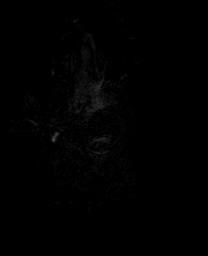
[im 20/60]
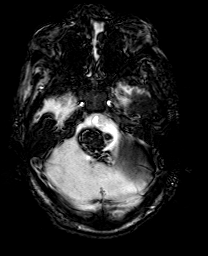
[im 40/60]
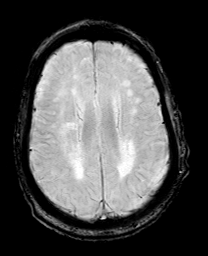
[im 60/60]
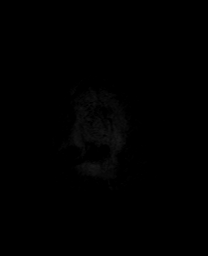

[Series 16: mip_images(sw) · axial · 24.0mm · 0.90mm/px · z∈[-112,+39]mm · 3 of 53 slices shown]
[im 1/53]
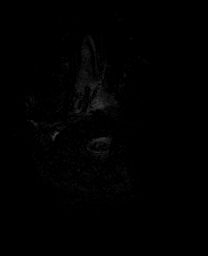
[im 27/53]
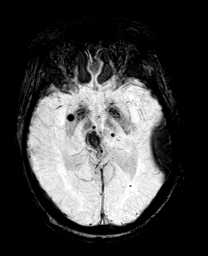
[im 53/53]
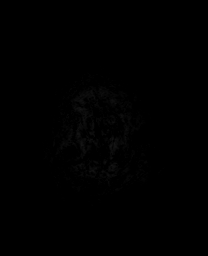

[Series 17: ax hemo · axial · 5.0mm · 0.86mm/px · z∈[-112,+45]mm · 2 of 28 slices shown]
[im 1/28]
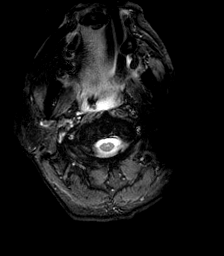
[im 28/28]
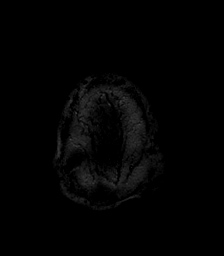

[34 of 48 positions shown; findings below may reference images not displayed]

FINDINGS: MRI HEAD FINDINGS

Brain: Heterogeneous T2 and largely isointense T1 hemorrhage within
the brainstem is expanding the pons and as seen by SWI tracks into
the 4th ventricle. Estimated intra-axial hemorrhage size by MRI is
24 x 29 by 28 mm (AP by transverse by CC) for an estimated blood
volume of 10 mL. Extensive pontine edema. On the dedicated orbit
images edema is seen to track into the right medullary pyramid
(series 8, image 7 of that exam). Edema also tracks cephalad into
the midbrain greater on the right.

Fourth ventricle size remains relatively normal. Punctate blood
layering in the left occipital horn but no other lateral or 3rd
intraventricular hemorrhage. No ventriculomegaly. No definite
transependymal edema. Small volume of subarachnoid hemorrhage in the
cisterna magna.

SWI also demonstrates chronic microhemorrhages scattered in the
cerebellum (especially on the left series 15, image 15) and
bilateral deep gray matter nuclei (image 34. Occasional cerebral
hemisphere microhemorrhages (image 35 on the left).

Basilar cisterns remain patent.  No midline shift.

DWI demonstrates no larger area of diffusion restriction.

No chronic cortical encephalomalacia but moderate Patchy and
confluent bilateral cerebral white matter T2 and FLAIR
hyperintensity in a pattern suggestive of chronic small vessel
ischemia.

Vascular: Major intracranial vascular flow voids are preserved.

Skull and upper cervical spine: Normal bone marrow signal. Negative
visible cervical spine.

Other: Mastoids are clear. Grossly normal visible internal auditory
structures. Intubated. Right nasoenteric tube in place. Fluid in the
pharynx.

MRI ORBITS FINDINGS

Orbits: Normal suprasellar cistern and optic chiasm. Optic nerves
appear symmetric, within normal limits. No contrast administered.
But there is no intraorbital fat stranding or inflammation
identified. No intraorbital mass or mass effect identified. Globes
appear normal. Unremarkable noncontrast cavernous sinus.

Visualized sinuses: Scattered paranasal sinus fluid and mucosal
thickening in the setting of intubation and right nasoenteric tube.

Soft tissues: Negative visible deep soft tissue spaces of the face.
IMPRESSION: 1. Acute brainstem hemorrhage centered at the pons with estimated
intra-axial blood volume of 10 mL.
Extensive pontine edema, which tracks associated edema which tracks
into the midbrain right > left and into the right medullary pyramid.

2. Mild extension of hemorrhage into the 4th ventricle and basilar
subarachnoid spaces. But no ventriculomegaly. No midline shift or
impending herniation.

3. Underlying advanced chronic small vessel disease including
multiple chronic microhemorrhages in the cerebellum and bilateral
deep gray matter nuclei. Advanced bilateral cerebral white matter
ischemia.

4. Negative noncontrast MRI appearance of the orbits.

## 2021-01-07 MED ORDER — SODIUM CHLORIDE 0.9 % IV SOLN
3.0000 g | Freq: Two times a day (BID) | INTRAVENOUS | Status: DC
  Administered 2021-01-07 – 2021-01-08 (×3): 3 g via INTRAVENOUS
  Filled 2021-01-07 (×3): qty 8

## 2021-01-07 MED ORDER — POTASSIUM CHLORIDE 10 MEQ/100ML IV SOLN
10.0000 meq | INTRAVENOUS | Status: AC
  Administered 2021-01-07 (×4): 10 meq via INTRAVENOUS
  Filled 2021-01-07 (×5): qty 100

## 2021-01-07 MED ORDER — POTASSIUM CHLORIDE 20 MEQ PO PACK
20.0000 meq | PACK | Freq: Once | ORAL | Status: AC
  Administered 2021-01-07: 20 meq
  Filled 2021-01-07: qty 1

## 2021-01-07 MED ORDER — THIAMINE HCL 100 MG PO TABS
100.0000 mg | ORAL_TABLET | Freq: Every day | ORAL | Status: DC
  Administered 2021-01-08 – 2021-01-28 (×21): 100 mg
  Filled 2021-01-07 (×21): qty 1

## 2021-01-07 MED ORDER — CARVEDILOL 12.5 MG PO TABS
12.5000 mg | ORAL_TABLET | Freq: Two times a day (BID) | ORAL | Status: DC
  Administered 2021-01-07 – 2021-01-12 (×8): 12.5 mg
  Filled 2021-01-07 (×8): qty 1

## 2021-01-07 MED ORDER — HYDRALAZINE HCL 50 MG PO TABS
100.0000 mg | ORAL_TABLET | Freq: Three times a day (TID) | ORAL | Status: DC
  Administered 2021-01-07 – 2021-01-16 (×26): 100 mg
  Filled 2021-01-07 (×26): qty 2

## 2021-01-07 MED ORDER — AMLODIPINE BESYLATE 10 MG PO TABS
10.0000 mg | ORAL_TABLET | Freq: Every day | ORAL | Status: DC
  Administered 2021-01-07 – 2021-01-21 (×13): 10 mg
  Filled 2021-01-07 (×15): qty 1

## 2021-01-07 NOTE — Progress Notes (Signed)
STROKE TEAM PROGRESS NOTE   INTERVAL HISTORY Admitted after being found down in his home by police who were conducting welfare check requested by family. Intubated for airway protection. Had complained of GI discomfort for several days prior to admission.  No acute events overnight. Son at bedside.  Remains intubated, CCM weaning sedation with precedex.  On cleviprex. He is opening eyes and following commands today.  Sons say he drinks "too much liquor" daily but not sure how much and that he has not filled prescriptions or seen a doctor in over a year. He would like to be added to contact list, nurse to assist.  We discussed his stroke diagnosis, ongoing work up and plan of care. We reviewed imaging with son at bedside and answered all his questions.   Vitals:   01/07/21 1330 01/07/21 1340 01/07/21 1345 01/07/21 1400  BP: 122/85 122/85 122/85 114/80  Pulse: 84 98 97 92  Resp: (!) 24 (!) 24 (!) 25 (!) 23  Temp:      TempSrc:      SpO2: 98% 100% 97% 99%  Weight:      Height:       CBC:  Recent Labs  Lab 01/19/2021 1308 01/25/2021 1700 01/07/21 0300  WBC 10.4  --  8.8  NEUTROABS 9.4*  --   --   HGB 10.9* 9.5* 9.0*  HCT 33.4* 28.0* 29.4*  MCV 94.4  --  96.7  PLT 251  --  99991111   Basic Metabolic Panel:  Recent Labs  Lab 01/27/2021 1308 01/12/2021 1411 01/21/2021 1506 01/21/2021 1700 01/22/2021 2109 01/07/21 0300 01/07/21 1037  NA 141  --    < > 149*   < > 149* 153*  K 3.8  --   --  2.9*  --  2.8*  --   CL 105  --   --   --   --  118*  --   CO2 23  --   --   --   --  21*  --   GLUCOSE 136*  --   --   --   --  113*  --   BUN 34*  --   --   --   --  27*  --   CREATININE 3.65*  --   --   --   --  3.59*  --   CALCIUM 9.1  --   --   --   --  8.9  --   MG  --  1.4*  --   --   --  2.2  --   PHOS  --   --   --   --   --  4.1  --    < > = values in this interval not displayed.   Lipid Panel: No results for input(s): CHOL, TRIG, HDL, CHOLHDL, VLDL, LDLCALC in the last 168 hours. HgbA1c:   Recent Labs  Lab 01/07/21 1037  HGBA1C 5.4   Urine Drug Screen:  Recent Labs  Lab 01/24/2021 1310  LABOPIA NONE DETECTED  COCAINSCRNUR NONE DETECTED  LABBENZ NONE DETECTED  AMPHETMU NONE DETECTED  THCU NONE DETECTED  LABBARB NONE DETECTED    Alcohol Level  Recent Labs  Lab 01/13/2021 1411  ETH <10    IMAGING past 24 hours CT ABDOMEN PELVIS WO CONTRAST  Result Date: 01/07/2021 CLINICAL DATA:  Inpatient. Acute nonlocalized abdominal pain. Pontine hemorrhage. EXAM: CT ABDOMEN AND PELVIS WITHOUT CONTRAST TECHNIQUE: Multidetector CT imaging of the abdomen and pelvis  was performed following the standard protocol without IV contrast. COMPARISON:  12/31/2020 abdominal radiograph. FINDINGS: Lower chest: Mild to moderate dependent bibasilar atelectasis. Coronary atherosclerosis. Hepatobiliary: Normal liver size. No liver mass. Prominently distended (6.4 cm diameter) gallbladder. No gallbladder wall thickening. No pericholecystic fluid. No radiopaque cholelithiasis. No biliary ductal dilatation. CBD diameter 5 mm. Pancreas: No pancreatic duct dilation or discrete pancreatic mass. Mild peripancreatic fat stranding at the pancreatic head. No peripancreatic fluid collections. Spleen: Normal size. No mass. Adrenals/Urinary Tract: No discrete adrenal nodules. No hydronephrosis. No renal stones. Simple 1.8 cm lower right renal cyst. Scattered subcentimeter hyperdense and hypodense bilateral renal cortical lesions are too small to characterize and require no follow-up. Bladder completely collapsed by indwelling Foley catheter with expected gas in the nondependent bladder lumen. Stomach/Bowel: Enteric tube terminates in duodenal bulb. Stomach is collapsed. Suggestion of generalized gastric wall thickening, poorly assessed on this scan, with associated fat stranding in the gastrohepatic ligament. Normal caliber small bowel with no small bowel wall thickening. Normal appendix. Moderate diffuse colonic  diverticulosis, with no large bowel wall thickening or significant pericolonic fat stranding. Vascular/Lymphatic: Atherosclerotic nonaneurysmal abdominal aorta. No pathologically enlarged lymph nodes in the abdomen or pelvis. Reproductive: Normal size prostate. Other: No pneumoperitoneum. Small volume ascites, predominantly perihepatic. No focal fluid collections. Symmetric mild-to-moderate bilateral gynecomastia. Musculoskeletal: No aggressive appearing focal osseous lesions. Mild thoracolumbar spondylosis. Chronic appearing mild T10 vertebral compression fracture. IMPRESSION: 1. Suggestion of generalized gastric wall thickening, poorly assessed on this scan due to gastric decompression by enteric tube, with associated fat stranding in the gastrohepatic ligament. Findings are nonspecific and could be due to noninflammatory edema, gastritis, peptic ulcer disease or neoplasm. Consider upper endoscopic correlation as clinically warranted. 2. Small volume ascites, predominantly perihepatic. 3. Prominently distended gallbladder. No radiopaque cholelithiasis. No gallbladder wall thickening. No pericholecystic fluid. No biliary ductal dilatation. 4. Moderate diffuse colonic diverticulosis. 5. Mild to moderate dependent bibasilar atelectasis. 6. Coronary atherosclerosis. 7. Chronic appearing mild T10 vertebral compression fracture. 8. Aortic Atherosclerosis (ICD10-I70.0). Electronically Signed   By: Ilona Sorrel M.D.   On: 01/07/2021 13:14   CT HEAD WO CONTRAST (5MM)  Result Date: 01/07/2021 CLINICAL DATA:  Stroke, follow-up 6 hour stability scan. EXAM: CT HEAD WITHOUT CONTRAST TECHNIQUE: Contiguous axial images were obtained from the base of the skull through the vertex without intravenous contrast. COMPARISON:  Brain MRI 01/07/2021. head CT 01/15/2021. FINDINGS: Brain: Mild generalized cerebral atrophy. Stable size of an acute parenchymal hemorrhage within the pons, again measuring 2.8 x 2.1 cm in transaxial  dimensions and 2.6 cm in craniocaudal dimension. As before, there is surrounding edema within the brainstem with mass effect and partial fourth ventricular effacement. Unchanged small volume extension of hemorrhage into the fourth ventricle and posterior fossa subarachnoid spaces. No evidence of obstructive hydrocephalus at this time. Stable supratentorial chronic small vessel ischemic disease. No demarcated cortical infarct. Vascular: No hyperdense vessel.  Atherosclerotic calcifications. Skull: Normal. Negative for fracture or focal lesion. Sinuses/Orbits: Visualized orbits show no acute finding. Mild mucosal thickening within the bilateral ethmoid, sphenoid and maxillary sinuses. IMPRESSION: Stable size of an acute parenchymal hemorrhage within the pons. As before, there is surrounding edema within the brainstem with mass effect and partial effacement of the fourth ventricle. No evidence of obstructive hydrocephalus at this time. Also unchanged, there is small-volume extension of hemorrhage into the fourth ventricle and posterior fossa subarachnoid spaces. Stable background cerebral atrophy and chronic supratentorial chronic small vessel ischemic disease. Electronically Signed   By: Kellie Simmering  D.O.   On: 01/07/2021 11:42   CT Head Wo Contrast  Result Date: 12/31/2020 CLINICAL DATA:  Mental status change.  Found unresponsive. EXAM: CT HEAD WITHOUT CONTRAST TECHNIQUE: Contiguous axial images were obtained from the base of the skull through the vertex without intravenous contrast. COMPARISON:  None. FINDINGS: Brain: There is a hyperdense mass within the brainstem measuring 2.8 by 2.1 by 2.6 cm compatible with hemorrhagic infarct. There is surrounding low attenuation edema. Posterior extension of clot is identified into the fourth ventricle and the cisterna magna, image 70/2. decreased CSF space is noted at the cerebellar pontine angle. There is moderate patchy low-attenuation within the subcortical and  periventricular white matter compatible with chronic microvascular disease. Prominence of the sulci identified compatible with brain atrophy. Vascular: No hyperdense vessel or unexpected calcification. Skull: Normal. Negative for fracture or focal lesion. Sinuses/Orbits: Mild mucosal thickening is noted involving the maxillary sinuses. Mastoid air cells are clear. Other: None. IMPRESSION: 1. Examination is positive for hemorrhagic infarct within the brainstem. 2. Posterior extension of clot into the fourth ventricle and the cisterna magna. 3. Chronic small vessel ischemic disease and brain atrophy. Critical Value/emergent results were called by telephone at the time of interpretation on 01/04/2021 at 2:41 pm to provider [facility] , who verbally acknowledged these results. Electronically Signed   By: Kerby Moors M.D.   On: 01/11/2021 14:42   MR BRAIN WO CONTRAST  Result Date: 01/07/2021 CLINICAL DATA:  55 year old male with altered mental status, found unresponsive. Brainstem hemorrhage. Ophthalmoplegia. EXAM: MRI HEAD AND ORBITS WITHOUT CONTRAST TECHNIQUE: Multiplanar, multi-echo pulse sequences of the brain and surrounding structures were acquired without intravenous contrast. Multiplanar, multi-echo pulse sequences of the orbits and surrounding structures were acquired including fat saturation techniques, without intravenous contrast administration. COMPARISON:  Plain head CT 01/21/2021. FINDINGS: MRI HEAD FINDINGS Brain: Heterogeneous T2 and largely isointense T1 hemorrhage within the brainstem is expanding the pons and as seen by SWI tracks into the 4th ventricle. Estimated intra-axial hemorrhage size by MRI is 24 x 29 by 28 mm (AP by transverse by CC) for an estimated blood volume of 10 mL. Extensive pontine edema. On the dedicated orbit images edema is seen to track into the right medullary pyramid (series 8, image 7 of that exam). Edema also tracks cephalad into the midbrain greater on the right.  Fourth ventricle size remains relatively normal. Punctate blood layering in the left occipital horn but no other lateral or 3rd intraventricular hemorrhage. No ventriculomegaly. No definite transependymal edema. Small volume of subarachnoid hemorrhage in the cisterna magna. SWI also demonstrates chronic microhemorrhages scattered in the cerebellum (especially on the left series 15, image 15) and bilateral deep gray matter nuclei (image 34. Occasional cerebral hemisphere microhemorrhages (image 35 on the left). Basilar cisterns remain patent.  No midline shift. DWI demonstrates no larger area of diffusion restriction. No chronic cortical encephalomalacia but moderate Patchy and confluent bilateral cerebral white matter T2 and FLAIR hyperintensity in a pattern suggestive of chronic small vessel ischemia. Vascular: Major intracranial vascular flow voids are preserved. Skull and upper cervical spine: Normal bone marrow signal. Negative visible cervical spine. Other: Mastoids are clear. Grossly normal visible internal auditory structures. Intubated. Right nasoenteric tube in place. Fluid in the pharynx. MRI ORBITS FINDINGS Orbits: Normal suprasellar cistern and optic chiasm. Optic nerves appear symmetric, within normal limits. No contrast administered. But there is no intraorbital fat stranding or inflammation identified. No intraorbital mass or mass effect identified. Globes appear normal. Unremarkable noncontrast cavernous sinus.  Visualized sinuses: Scattered paranasal sinus fluid and mucosal thickening in the setting of intubation and right nasoenteric tube. Soft tissues: Negative visible deep soft tissue spaces of the face. IMPRESSION: 1. Acute brainstem hemorrhage centered at the pons with estimated intra-axial blood volume of 10 mL. Extensive pontine edema, which tracks associated edema which tracks into the midbrain right > left and into the right medullary pyramid. 2. Mild extension of hemorrhage into the 4th  ventricle and basilar subarachnoid spaces. But no ventriculomegaly. No midline shift or impending herniation. 3. Underlying advanced chronic small vessel disease including multiple chronic microhemorrhages in the cerebellum and bilateral deep gray matter nuclei. Advanced bilateral cerebral white matter ischemia. 4. Negative noncontrast MRI appearance of the orbits. Electronically Signed   By: Genevie Ann M.D.   On: 01/07/2021 05:37   DG CHEST PORT 1 VIEW  Result Date: 01/27/2021 CLINICAL DATA:  Central line placement EXAM: PORTABLE CHEST 1 VIEW COMPARISON:  01/05/2021 FINDINGS: Endotracheal tube tip is about 4 cm superior to carina. Left IJ central venous catheter tip over the SVC. Esophageal tube tip below the diaphragm. No visible pneumothorax. Interval airspace disease at left base which may be due to atelectasis or aspiration. IMPRESSION: 1. Left IJ central venous catheter tip over the SVC. No pneumothorax 2. Interval airspace disease at left base which may be due to atelectasis or aspiration Electronically Signed   By: Donavan Foil M.D.   On: 01/27/2021 18:33   DG Chest Port 1 View  Result Date: 01/14/2021 CLINICAL DATA:  OG tube placement EXAM: PORTABLE CHEST 1 VIEW COMPARISON:  None. FINDINGS: Endotracheal tube with the tip 5.8 cm above the carina. Nasogastric tube coursing below the diaphragm with the tip excluded from the field of view. No focal consolidation. No pleural effusion or pneumothorax. Heart and mediastinal contours are unremarkable. No acute osseous abnormality. IMPRESSION: Endotracheal tube with the tip 5.8 cm above the carina. Nasogastric tube coursing below the diaphragm with the tip excluded from the field of view. Electronically Signed   By: Kathreen Devoid M.D.   On: 01/05/2021 17:18   DG Abd Portable 1V  Result Date: 01/02/2021 CLINICAL DATA:  NG tube EXAM: PORTABLE ABDOMEN - 1 VIEW COMPARISON:  None. FINDINGS: Esophageal tube tip overlies the distal stomach. Upper gas pattern is  unremarkable IMPRESSION: Esophageal tube tip overlies the distal stomach Electronically Signed   By: Donavan Foil M.D.   On: 01/10/2021 18:34   MR ORBITS WO CONTRAST  Result Date: 01/07/2021 CLINICAL DATA:  55 year old male with altered mental status, found unresponsive. Brainstem hemorrhage. Ophthalmoplegia. EXAM: MRI HEAD AND ORBITS WITHOUT CONTRAST TECHNIQUE: Multiplanar, multi-echo pulse sequences of the brain and surrounding structures were acquired without intravenous contrast. Multiplanar, multi-echo pulse sequences of the orbits and surrounding structures were acquired including fat saturation techniques, without intravenous contrast administration. COMPARISON:  Plain head CT 01/12/2021. FINDINGS: MRI HEAD FINDINGS Brain: Heterogeneous T2 and largely isointense T1 hemorrhage within the brainstem is expanding the pons and as seen by SWI tracks into the 4th ventricle. Estimated intra-axial hemorrhage size by MRI is 24 x 29 by 28 mm (AP by transverse by CC) for an estimated blood volume of 10 mL. Extensive pontine edema. On the dedicated orbit images edema is seen to track into the right medullary pyramid (series 8, image 7 of that exam). Edema also tracks cephalad into the midbrain greater on the right. Fourth ventricle size remains relatively normal. Punctate blood layering in the left occipital horn but no other lateral or  3rd intraventricular hemorrhage. No ventriculomegaly. No definite transependymal edema. Small volume of subarachnoid hemorrhage in the cisterna magna. SWI also demonstrates chronic microhemorrhages scattered in the cerebellum (especially on the left series 15, image 15) and bilateral deep gray matter nuclei (image 34. Occasional cerebral hemisphere microhemorrhages (image 35 on the left). Basilar cisterns remain patent.  No midline shift. DWI demonstrates no larger area of diffusion restriction. No chronic cortical encephalomalacia but moderate Patchy and confluent bilateral cerebral  white matter T2 and FLAIR hyperintensity in a pattern suggestive of chronic small vessel ischemia. Vascular: Major intracranial vascular flow voids are preserved. Skull and upper cervical spine: Normal bone marrow signal. Negative visible cervical spine. Other: Mastoids are clear. Grossly normal visible internal auditory structures. Intubated. Right nasoenteric tube in place. Fluid in the pharynx. MRI ORBITS FINDINGS Orbits: Normal suprasellar cistern and optic chiasm. Optic nerves appear symmetric, within normal limits. No contrast administered. But there is no intraorbital fat stranding or inflammation identified. No intraorbital mass or mass effect identified. Globes appear normal. Unremarkable noncontrast cavernous sinus. Visualized sinuses: Scattered paranasal sinus fluid and mucosal thickening in the setting of intubation and right nasoenteric tube. Soft tissues: Negative visible deep soft tissue spaces of the face. IMPRESSION: 1. Acute brainstem hemorrhage centered at the pons with estimated intra-axial blood volume of 10 mL. Extensive pontine edema, which tracks associated edema which tracks into the midbrain right > left and into the right medullary pyramid. 2. Mild extension of hemorrhage into the 4th ventricle and basilar subarachnoid spaces. But no ventriculomegaly. No midline shift or impending herniation. 3. Underlying advanced chronic small vessel disease including multiple chronic microhemorrhages in the cerebellum and bilateral deep gray matter nuclei. Advanced bilateral cerebral white matter ischemia. 4. Negative noncontrast MRI appearance of the orbits. Electronically Signed   By: Genevie Ann M.D.   On: 01/07/2021 05:37    PHYSICAL EXAM Physical Exam  Constitutional: Appears thin, chronically ill Eyes: No scleral injection. Proptotic, which is new per family, right worse than left.  Right eye also has some exudate/clouding and is chemotic HENT: No oropharyngeal obstruction.  MSK: no joint  deformities.  Cardiovascular: Tachycardia 100s, SR on cardiac monitor  Respiratory: Intubated, not labored,symmetric rise  Skin: Warm dry and intact visible skin   Neuro: Mental Status: Patient is awake, intubated, off sedation. Following simple but not complex commands. Eyes open.  Cranial Nerves: II: Pupils are equal, round, and reactive to light.  2 mm -> 1 mm reactive, III,IV, VI: EOM w/ occular bobbing, mildly dysconjugate gaze V: Facial sensation corneals bilaterally intact VII: Facial movement is notable for upper face more reactive on the left than the right, lower face limited eval due to ETT VIII: hearing is intact to voice X/XI: Gag present  Motor: Antigravity on LUE, RUE weakly following commands with the hand, Antigravity LLE  and 2/3 RLE  Sensory: Less reactive on the left than the right Cerebellar: UTA     ASSESSMENT/PLAN GOTTLIEB HARTMAN is a 55 y.o. male with a past medical history significant for uncontrolled hypertension complicated by CKD stage IV, heart failure, hyperlipidemia, daily drinking, medication nonadherence, iron deficiency anemia/anemia of chronic disease, presenting with a pontine hemorrhage. ICH Score: 3. NIHSS 24. Family reported he had some difficulty swallowing for a few days and had been complaining of GI discomfort.   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   Code Stroke  1. Examination is positive for hemorrhagic infarct within the brainstem. 2. Posterior  extension of clot into the fourth ventricle and the cisterna magna. 3. Chronic small vessel ischemic disease and brain atrophy. MRI   1. Acute brainstem hemorrhage centered at the pons with estimated intra-axial blood volume of 10 mL. Extensive pontine edema, which tracks associated edema which tracks into the midbrain right > left and into the right medullary pyramid. 2. Mild extension of hemorrhage into the 4th  ventricle and basilar subarachnoid spaces. But no ventriculomegaly. No midline shift or impending herniation. 3. Underlying advanced chronic small vessel disease including multiple chronic microhemorrhages in the cerebellum and bilateral deep gray matter nuclei. Advanced bilateral cerebral white matter ischemia. 4. Negative noncontrast MRI appearance of the orbits. MR Orbits Negative noncontrast MRI appearance of the orbits.  2D Echo PENDING  CT Chest Abd Pelvis 1. Suggestion of generalized gastric wall thickening, poorly assessed on this scan due to gastric decompression by enteric tube, with associated fat stranding in the gastrohepatic ligament. Findings are nonspecific and could be due to noninflammatory edema,gastritis, peptic ulcer disease or neoplasm. Consider upper endoscopic correlation as clinically warranted. 2. Small volume ascites, predominantly perihepatic. 3. Prominently distended gallbladder. No radiopaque cholelithiasis. No gallbladder wall thickening. No pericholecystic fluid. No biliary ductal dilatation. 4. Moderate diffuse colonic diverticulosis. 5. Mild to moderate dependent bibasilar atelectasis. 6. Coronary atherosclerosis. 7. Chronic appearing mild T10 vertebral compression fracture. 8. Aortic Atherosclerosis (ICD10-I70.0).  LDL  HgbA1c 5.4 VTE prophylaxis - SCDs    Diet   Diet NPO time specified  Not on Anticoagulant or antiplatelet prior to admission Not taking Rx medications prior to admission Hold anticoagulant/antiplatelet medications in the setting of hemorrhage Therapy recommendations:  PENDING Disposition:  TBD  Cerebral Edema Neurosurgery consulted, no acute intervention recommended at this time. Hypertonic saline infusion with sodium goal 150-155 Na 149->147->153  Acute respiratory failure Probable aspiration pneumonia Remains intubated, appreciate CCM management Unasyn IV on board  Resp culture pending  Hypertension Home meds: not  taking any prescribed meds >1 year  Unstable Keep systolic BP less than [id number]  Long-term BP goal normotensive  Hyperlipidemia Home meds:  None LDL PENDING goal < 70 High intensity statin to be addressed  Continue statin at discharge  Glycemic management No hx of DM2  HgbA1c  5.4, at goal < 7.0 CBGs Recent Labs    01/07/21 0448 01/07/21 0834 01/07/21 1153  GLUCAP 119* 108* 129*    SSI  Hypokalemia Monitor and replete as needed 3.9->2.8   ETOH use disorder with dependence Daily use of excessive liquor per family (?1/2 bottle vodka)  Thiamine, folate and MVI on board Precedex in use Monitor magnesium, 2.2 Vitamin B12 -213 Monitor s/s of withdrawal, need for CIWA   Other Stroke Risk Factors Current Cigarette smoker, will be advised to stop smoking ETOH use disorder alcohol level <10, advised to drink no more than 2 drink(s) a day  Other Active Problems ? Hyperthyroidism, outpatient follow up  Hospital day # 1 This patient was seen and evaluated with Dr. Reeves Forth. He directed the plan of care.  Charlene Brooke, NP-C   To contact Stroke Continuity provider, please refer to [email or web address] After hours, contact General Neurology

## 2021-01-07 NOTE — Progress Notes (Signed)
  Echocardiogram 2D Echocardiogram has been performed.  Timothy Good M 01/07/2021, 2:34 PM

## 2021-01-07 NOTE — Progress Notes (Signed)
PT Cancellation Note  Patient Details Name: Timothy Good MRN: [id number] DOB: October 06, 1965   Cancelled Treatment:    Reason Eval/Treat Not Completed: Active bedrest order.  Pt on bedrest and intubated. PT will await updated activity orders and further medical stability.  Thanks,  Verdene Lennert, PT, DPT  Acute Rehabilitation Ortho Tech Supervisor [phone] pager #[phone] office      Timothy Good 01/07/2021, 6:47 AM

## 2021-01-07 NOTE — Progress Notes (Signed)
RN and RRT transported pt to MRI and back with no complications.

## 2021-01-07 NOTE — Progress Notes (Signed)
PT Cancellation Note  Patient Details Name: Timothy Good MRN: [id number] DOB: 1965-08-08   Cancelled Treatment:    Reason Eval/Treat Not Completed: Patient at procedure or test/unavailable (CT abdomen).  Wyona Almas, PT, DPT Acute Rehabilitation Services Pager [phone] Office [phone]    Deno Etienne 01/07/2021, 11:54 AM

## 2021-01-07 NOTE — Progress Notes (Signed)
Pharmacy Antibiotic Note  Timothy Good is a 55 y.o. male admitted on 12/31/2020 with brainstem stroke. Pharmacy has been consulted for Unasyn dosing for aspiration PNA.  Patient has baseline CKD4.  Afebrile, WBC WNL.  Plan: Unasyn 3gm IV Q12H Monitor renal fxn, clinical progress  Height: '6\' 2"'$  (188 cm) Weight: 70.7 kg (155 lb 13.8 oz) IBW/kg (Calculated) : 82.2  Temp (24hrs), Avg:98.5 F (36.9 C), Min:97.9 F (36.6 C), Max:99 F (37.2 C)  Recent Labs  Lab 01/02/2021 1308 01/27/2021 1506 01/07/21 0300  WBC 10.4  --  8.8  CREATININE 3.65*  --  3.59*  LATICACIDVEN 2.0* 1.7  --     Estimated Creatinine Clearance: 23.8 mL/min (A) (by C-G formula based on SCr of 3.59 mg/dL (H)).    Allergies  Allergen Reactions   Penicillins Other (See Comments)    Lost mobility in legs for a week.      Unasyn 10/8 >>   10/7 MRSA PCR - negative 10/7 UCx -  10/7 BCx -  NGTD 10/7 TA -   Jini Horiuchi D. Mina Marble, PharmD, BCPS, [place] 01/07/2021, 9:55 AM

## 2021-01-07 NOTE — Progress Notes (Signed)
Pt transported to CT and back to 4N 30 on full vent support. No complications noted.

## 2021-01-07 NOTE — Consult Note (Signed)
   Providing Compassionate, Quality Care - Together  Neurosurgery Consult  Referring physician: Dr. Curly Shores Reason for referral: Pontine hemorrhage  Chief Complaint: Altered mental status  History of Present Illness: 55 year old male with a history of hypertension, CKD, heart failure, hyperlipidemia, alcohol abuse, anemia that was found down by police and taken to [facility].  Work-up revealed pontine hemorrhage and he was transferred to [facility] for further evaluation and management.  At this time he remains intubated with a poor neurologic exam.  History as per the chart.   Medications: I have reviewed the patient's current medications. Allergies: No Known Allergies  History reviewed. No pertinent family history. Social History:  has no history on file for tobacco use, alcohol use, and drug use.  ROS: Unable to obtain  Physical Exam:  Vital signs in last 24 hours: Temp:  [98 F (36.7 C)-98.3 F (36.8 C)] 98 F (36.7 C) (07/25 1814) Pulse Rate:  [58-128] 65 (07/26 0746) Resp:  [11-18] 14 (07/26 0217) BP: (138-182)/(65-125) 153/88 (07/26 0700) SpO2:  [91 %-98 %] 96 % (07/26 0746) PE: Intubated, sedated Eyes closed, pupils equally round reactive light bilaterally; disconjugate gaze Slight gag No motor response bilaterally  Impression/Assessment:  55 year old male with  Pontine hemorrhage with intraventricular hemorrhage  Plan:  -Continue ICU care. -No neurosurgical intervention at this time. -MRI reviewed, no evidence of hydrocephalus at this time.  Please reconsult neurosurgery in the event of development of hydrocephalus.   Thank you for allowing me to participate in this patient's care.  Please do not hesitate to call with questions or concerns.   Elwin Sleight, [facility] Cell: [phone]

## 2021-01-07 NOTE — Progress Notes (Signed)
[place] Progress Note Patient Name: [name] DOB: Apr 04, 1965 MRN: [id number]   Date of Service  01/07/2021  HPI/Events of Note  Hypokalemia - K+ = 2.8 and Creatinine = 3.59.   eICU Interventions  Will cautiously replace K+.     Intervention Category Major Interventions: Electrolyte abnormality - evaluation and management  Emari Hreha [place] 01/07/2021, 4:44 AM

## 2021-01-07 NOTE — Progress Notes (Signed)
NAME:  Timothy Good, MRN:  [id number], DOB:  December 08, 1965, LOS: 1 ADMISSION DATE:  01/27/2021, CONSULTATION DATE:  01/16/2021 REFERRING MD:  Curly Shores, CHIEF COMPLAINT:  found down   History of Present Illness:  55 y/o male with multiple medical problems was found down by in a well call visit by police and was brought to [facility] with a diagnosis of pontine hemorrhage.  He was intubated for airway protection and then transferred to [facility] for further evaluation.  Pulmonary and critical care medicine was consulted for medical management in the setting of acute hemorrhagic stroke.  The patient was unable to provide history because he was intubated.  Significant Hospital Events: Including procedures, antibiotic start and stop dates in addition to other pertinent events   January 06, 2021 admission, intubation  Interim History / Subjective:  No overnight issues Sedation was turned off, patient is awake and following commands  Objective   Blood pressure (!) 137/92, pulse 100, temperature 98.4 F (36.9 C), temperature source Oral, resp. rate (!) 21, height '6\' 2"'$  (1.88 m), weight 70.7 kg, SpO2 96 %.    Vent Mode: PSV;CPAP FiO2 (%):  [40 %-100 %] 40 % Set Rate:  [12 bmp-18 bmp] 12 bmp Vt Set:  [490 mL-500 mL] 490 mL PEEP:  [5 cmH20] 5 cmH20 Pressure Support:  [5 cmH20] 5 cmH20 Plateau Pressure:  [13 cmH20-15 cmH20] 14 cmH20   Intake/Output Summary (Last 24 hours) at 01/07/2021 1056 Last data filed at 01/07/2021 0900 Gross per 24 hour  Intake 2162.8 ml  Output 650 ml  Net 1512.8 ml   Filed Weights   01/09/2021 1315 01/26/2021 1448 01/19/2021 1647  Weight: 101 kg 100.7 kg 70.7 kg    Examination: General: Crtitically ill-appearing male, orally intubated HEENT: [place]/AT, eyes anicteric.  ETT with thick greenish secretions.  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 uncontrolled hypertension Cerebral edema Acute encephalopathy due to hemorrhagic stroke Induced hypernatremia Continue neuro watch every hour Neurology and neurosurgery recommendations and appreciated Blood pressure control: Goal less than 140/90 Cleviprex for blood pressure goal Restarted oral antihypertensive and try to taper off clevidipine infusion As needed labetalol for blood pressure goal Continue hypertonic saline to reduce brain swelling, goal serum sodium 150-155 Neurosurgery recommend no indication of EVD as there is no hydrocephalus Minimize sedation with RASS goal 0/-1  Acute respiratory failure with hypoxemia acute hemorrhagic pontine stroke Probable aspiration pneumonia Continue lung protective ventilation SBT as tolerated Started on IV Unasyn Follow-up respiratory culture  Chronic kidney disease stage IV Hypokalemia Monitor BMET and UOP Aggressively supplement electrolytes and monitor  Uncontrolled hypertension Continue Cardene infusion Started on oral hypertensive medicine  Alcohol dependence Patient drinks half bottle of vodka every day Continue thiamine, folate and multivitamin Monitor for signs of withdrawal Precedex as needed  Probable hyperthyroidism Outpatient follow-up with Endo  Best Practice (right click and "Reselect all SmartList Selections" daily)   Diet/type: tubefeeds DVT prophylaxis: SCD GI prophylaxis: H2B Lines: N/A Foley:  Yes, and it is still needed Code Status:  full code Last date of multidisciplinary goals of care discussion [per primary]  Labs   CBC:  Recent Labs  Lab 01/12/2021 1308 01/12/2021 1700 01/07/21 0300  WBC 10.4  --  8.8  NEUTROABS  9.4*  --   --   HGB 10.9* 9.5* 9.0*  HCT 33.4* 28.0* 29.4*  MCV 94.4  --  96.7  PLT 251  --  [id number]    Basic Metabolic Panel: Recent Labs  Lab 01/19/2021 1308 01/13/2021 1411 01/29/2021 1506 01/21/2021 1700 01/21/2021 2109 01/07/21 0300  NA 141  --  142 149* 147* 149*  K 3.8  --   --  2.9*  --  2.8*  CL 105  --   --   --   --  118*  CO2 23  --   --   --   --  21*  GLUCOSE 136*  --   --   --   --  113*  BUN 34*  --   --   --   --  27*  CREATININE 3.65*  --   --   --   --  3.59*  CALCIUM 9.1  --   --   --   --  8.9  MG  --  1.4*  --   --   --  2.2  PHOS  --   --   --   --   --  4.1   GFR: Estimated Creatinine Clearance: 23.8 mL/min (A) (by C-G formula based on SCr of 3.59 mg/dL (H)). Recent Labs  Lab 01/05/2021 1308 01/04/2021 1506 01/07/21 0300  WBC 10.4  --  8.8  LATICACIDVEN 2.0* 1.7  --     Liver Function Tests: Recent Labs  Lab 01/08/2021 1308 01/07/21 0300  AST 31  --   ALT 15  --   ALKPHOS 71  --   BILITOT 0.8  --   PROT 7.6  --   ALBUMIN 3.3* 2.5*   Recent Labs  Lab 01/08/2021 1308  LIPASE 28   No results for input(s): AMMONIA in the last 168 hours.  ABG    Component Value Date/Time   PHART 7.409 01/22/2021 1700   PCO2ART 35.7 01/30/2021 1700   PO2ART 96 01/19/2021 1700   HCO3 22.6 01/16/2021 1700   TCO2 24 01/28/2021 1700   ACIDBASEDEF 2.0 01/18/2021 1700   O2SAT 98.0 01/03/2021 1700     Coagulation Profile: Recent Labs  Lab 01/04/2021 1308  INR 1.0    Cardiac Enzymes: Recent Labs  Lab 01/18/2021 1801  CKTOTAL 419*    HbA1C: No results found for: HGBA1C  CBG: Recent Labs  Lab 01/24/2021 2343 01/07/21 0448 01/07/21 0834  GLUCAP 105* 119* 108*     Total critical care time: 44 minutes  Performed by: [place] care time was exclusive of separately billable procedures and treating other patients.   Critical care was necessary to treat or prevent imminent or life-threatening deterioration.   Critical care was time spent  personally by me on the following activities: development of treatment plan with patient and/or surrogate as well as nursing, discussions with consultants, evaluation of patient's response to treatment, examination of patient, obtaining history from patient or surrogate, ordering and performing treatments and interventions, ordering and review of laboratory studies, ordering and review of radiographic studies, pulse oximetry and re-evaluation of patient's condition.   Jacky Kindle MD [place] See Amion for pager If no response to pager, please call [phone] until 7pm After 7pm, Please call E-link [phone]

## 2021-01-08 DIAGNOSIS — I613 Nontraumatic intracerebral hemorrhage in brain stem: Secondary | ICD-10-CM | POA: Diagnosis not present

## 2021-01-08 LAB — URINE CULTURE: Culture: NO GROWTH

## 2021-01-08 LAB — BASIC METABOLIC PANEL
BUN: 27 mg/dL — ABNORMAL HIGH (ref 6–20)
CO2: 17 mmol/L — ABNORMAL LOW (ref 22–32)
Calcium: 9 mg/dL (ref 8.9–10.3)
Chloride: >130 mmol/L (ref 98–111)
Creatinine, Ser: 3.8 mg/dL — ABNORMAL HIGH (ref 0.61–1.24)
GFR, Estimated: 18 mL/min — ABNORMAL LOW (ref >60)
Glucose, Bld: 116 mg/dL — ABNORMAL HIGH (ref 70–99)
Potassium: 3.6 mmol/L (ref 3.5–5.1)
Sodium: 159 mmol/L — ABNORMAL HIGH (ref 135–145)

## 2021-01-08 LAB — SODIUM
Sodium: 161 mmol/L (ref 135–145)
Sodium: 161 mmol/L (ref 135–145)

## 2021-01-08 LAB — GLUCOSE, CAPILLARY
Glucose-Capillary: 101 mg/dL — ABNORMAL HIGH (ref 70–99)
Glucose-Capillary: 96 mg/dL (ref 70–99)
Glucose-Capillary: 81 mg/dL (ref 70–99)
Glucose-Capillary: 79 mg/dL (ref 70–99)
Glucose-Capillary: 75 mg/dL (ref 70–99)

## 2021-01-08 LAB — LIPID PANEL
Cholesterol: 184 mg/dL (ref 0–200)
HDL: 95 mg/dL (ref >40)
LDL Cholesterol: 75 mg/dL (ref 0–99)
Total CHOL/HDL Ratio: 1.9 RATIO
Triglycerides: 71 mg/dL (ref <150)
VLDL: 14 mg/dL (ref 0–40)

## 2021-01-08 LAB — CBC
HCT: 28.8 % — ABNORMAL LOW (ref 39.0–52.0)
Hemoglobin: 8.6 g/dL — ABNORMAL LOW (ref 13.0–17.0)
MCH: 30 pg (ref 26.0–34.0)
MCHC: 29.9 g/dL — ABNORMAL LOW (ref 30.0–36.0)
MCV: 100.3 fL — ABNORMAL HIGH (ref 80.0–100.0)
Platelets: 157 K/uL (ref 150–400)
RBC: 2.87 MIL/uL — ABNORMAL LOW (ref 4.22–5.81)
RDW: 15.2 % (ref 11.5–15.5)
WBC: 10.6 K/uL — ABNORMAL HIGH (ref 4.0–10.5)
nRBC: 0 % (ref 0.0–0.2)

## 2021-01-08 LAB — TRIGLYCERIDES: Triglycerides: 76 mg/dL

## 2021-01-08 LAB — MAGNESIUM: Magnesium: 2.2 mg/dL (ref 1.7–2.4)

## 2021-01-08 LAB — T4, FREE: Free T4: 0.64 ng/dL (ref 0.61–1.12)

## 2021-01-08 MED ORDER — CEFTRIAXONE SODIUM 1 G IJ SOLR
1.0000 g | INTRAMUSCULAR | Status: AC
  Administered 2021-01-08 – 2021-01-13 (×6): 1 g via INTRAVENOUS
  Filled 2021-01-08 (×6): qty 10

## 2021-01-08 MED ORDER — FREE WATER
200.0000 mL | Status: DC
  Administered 2021-01-08 – 2021-01-11 (×31): 200 mL

## 2021-01-08 NOTE — Progress Notes (Signed)
SLP Cancellation Note  Patient Details Name: Timothy Good MRN: [id number] DOB: 09-Mar-1966   Cancelled treatment:       Reason Eval/Treat Not Completed: Medical issues which prohibited therapy;Other (comment) (continues to be intubated, not very responsive. SLP will follow for readiness.)  Sonia Baller, MA, CCC-SLP Speech Therapy

## 2021-01-08 NOTE — Evaluation (Signed)
Occupational Therapy Evaluation Patient Details Name: Timothy Good MRN: [id number] DOB: [birth year] Today's Date: 01/08/2021   History of Present Illness 55 y.o. male presents to [facility] hospital on 01/13/2021, found down during a well check. Pt was emergently intubated and head CT revealed pontine hemorrhage. PMH includes HTN, CKD stage IV, HLD, alcohol abuse, anemia.   Clinical Impression   This 55 yo male admitted with above presents to acute OT with PLOF thought to be totally independent with basic ADLs and mobility.Currently pt is Max-total A for all basic ADLs and Max A-total A +2 for bed mobility and unable to maintain EOB. He will continue to benefit from acute OT with follow up on CIR.      Recommendations for follow up therapy are one component of a multi-disciplinary discharge planning process, led by the attending physician.  Recommendations may be updated based on patient status, additional functional criteria and insurance authorization.   Follow Up Recommendations  CIR;Supervision/Assistance - 24 hour    Equipment Recommendations  Other (comment) (TBD next venue)    Recommendations for Other Services Rehab consult     Precautions / Restrictions Precautions Precautions: Fall Precaution Comments: intubated Restrictions Weight Bearing Restrictions: No      Mobility Bed Mobility Overal bed mobility: Needs Assistance Bed Mobility: Supine to Sit;Sit to Supine     Supine to sit: Max assist;+2 for physical assistance;HOB elevated Sit to supine: Total assist;+2 for physical assistance   General bed mobility comments: pt requires assistance to move LLE, able to mobilize RLE. Pt activates core to elevate trunk with assist and utilizes RUE to push on bed    Transfers Overall transfer level:  (deferred as pt remains intubated, short IV lines)                    Balance Overall balance assessment: Needs assistance Sitting-balance support: Single extremity  supported;Feet unsupported Sitting balance-Leahy Scale: Poor Sitting balance - Comments: min-modA, unable to correct losses of balance, multiple anterior losses of balance                                   ADL either performed or assessed with clinical judgement   ADL Overall ADL's : Needs assistance/impaired Eating/Feeding: NPO   Grooming: Maximal assistance Grooming Details (indicate cue type and reason): supported sitting Upper Body Bathing: Maximal assistance Upper Body Bathing Details (indicate cue type and reason): supported sitting Lower Body Bathing: Total assistance;Bed level   Upper Body Dressing : Total assistance Upper Body Dressing Details (indicate cue type and reason): supported sitting Lower Body Dressing: Total assistance;Bed level                       Vision   Vision Assessment?: Vision impaired- to be further tested in functional context Additional Comments: Bulging eyes (R>L)            Pertinent Vitals/Pain Pain Assessment: No/denies pain     Hand Dominance Left   Extremity/Trunk Assessment Upper Extremity Assessment Upper Extremity Assessment: LUE deficits/detail LUE Deficits / Details: Dense hemiplegia, PROM WNL, no sensation noted LUE Coordination: decreased fine motor;decreased gross motor   Lower Extremity Assessment Lower Extremity Assessment: LLE deficits/detail LLE Deficits / Details: flaccid LLE, PROM WFL LLE Sensation:  (difficult to assess 2/2 intubation)   Cervical / Trunk Assessment Cervical / Trunk Assessment: Normal   Communication Communication Communication:  (intubated, pt  communicates via head nods and thumbs up)   Cognition Arousal/Alertness: Awake/alert Behavior During Therapy: Flat affect Overall Cognitive Status: Difficult to assess                                 General Comments: pt follows one step commands well to move R side during session   General Comments  40% FiO2, 5  PEEP, VSS with mobility            Home Living Family/patient expects to be discharged to:: Private residence Living Arrangements: Alone                               Additional Comments: difficult to assess, pt remains intubated      Prior Functioning/Environment Level of Independence: Independent                 OT Problem List: Decreased strength;Decreased range of motion;Impaired balance (sitting and/or standing);Impaired vision/perception;Decreased coordination;Decreased cognition;Decreased safety awareness;Impaired UE functional use;Impaired tone;Impaired sensation;Cardiopulmonary status limiting activity      OT Treatment/Interventions: Self-care/ADL training;Patient/family education;DME and/or AE instruction;Balance training;Visual/perceptual remediation/compensation;Therapeutic exercise;Therapeutic activities    OT Goals(Current goals can be found in the care plan section) Acute Rehab OT Goals Patient Stated Goal: Pt unable to state 2/2 intubated, agreeable to get up to EOB OT Goal Formulation: Patient unable to participate in goal setting Time For Goal Achievement: 01/22/21 Potential to Achieve Goals: Fair  OT Frequency: Min 2X/week   Barriers to D/C: Decreased caregiver support          Co-evaluation PT/OT/SLP Co-Evaluation/Treatment: Yes Reason for Co-Treatment: Complexity of the patient's impairments (multi-system involvement);Necessary to address cognition/behavior during functional activity;For patient/therapist safety;To address functional/ADL transfers PT goals addressed during session: Mobility/safety with mobility;Balance;Strengthening/ROM OT goals addressed during session: ADL's and self-care;Strengthening/ROM      AM-PAC OT "6 Clicks" Daily Activity     Outcome Measure Help from another person eating meals?: Total Help from another person taking care of personal grooming?: A Lot Help from another person toileting, which includes  using toliet, bedpan, or urinal?: Total Help from another person bathing (including washing, rinsing, drying)?: A Lot Help from another person to put on and taking off regular upper body clothing?: Total Help from another person to put on and taking off regular lower body clothing?: Total 6 Click Score: 8   End of Session Nurse Communication: Mobility status (alot of thick secretions suctioned from mouth when we sat him up on EOB.)  Activity Tolerance: Patient tolerated treatment well Patient left: in bed;with call bell/phone within reach;with bed alarm set  OT Visit Diagnosis: Muscle weakness (generalized) (M62.81);Other abnormalities of gait and mobility (R26.89);Low vision, both eyes (H54.2);Other symptoms and signs involving cognitive function;Hemiplegia and hemiparesis Hemiplegia - Right/Left: Left Hemiplegia - dominant/non-dominant: Dominant                Time: 1440-1501 OT Time Calculation (min): 21 min Charges:  OT General Charges $OT Visit: 1 Visit OT Evaluation $OT Eval Moderate Complexity: 1 Mod  [place], OTR/L Acute [employer] Pager [phone] Office [phone]    Almon Register 01/08/2021, 4:33 PM

## 2021-01-08 NOTE — Evaluation (Signed)
Physical Therapy Evaluation Patient Details Name: Timothy Good MRN: [id number] DOB: 07-May-1965 Today's Date: 01/08/2021  History of Present Illness  55 y.o. male presents to [facility] hospital on 01/15/2021, found down during a well check. Pt was emergently intubated and head CT revealed pontine hemorrhage. PMH includes HTN, CKD stage IV, HLD, alcohol abuse, anemia.  Clinical Impression  Pt presents to PT with deficits in functional mobility, gait, balance, power, endurance, tone. Pt remains intubated at the time of eval, limiting assessment of communication and cognition. Pt requires significant physical assistance to perform bed mobility and remains flaccid on L side at this time. Pt with impaired sitting balance, requiring constant physical assistance to prevent falls. Pt will benefit from continued aggressive mobilization to reduce falls risk and aide in a return to the pt's prior level of function.       Recommendations for follow up therapy are one component of a multi-disciplinary discharge planning process, led by the attending physician.  Recommendations may be updated based on patient status, additional functional criteria and insurance authorization.  Follow Up Recommendations CIR;Supervision/Assistance - 24 hour    Equipment Recommendations  Wheelchair (measurements PT);Wheelchair cushion (measurements PT);Hospital bed;Other (comment) (hoyer lift)    Recommendations for Other Services Rehab consult     Precautions / Restrictions Precautions Precautions: Fall Precaution Comments: intubated Restrictions Weight Bearing Restrictions: No      Mobility  Bed Mobility Overal bed mobility: Needs Assistance Bed Mobility: Supine to Sit;Sit to Supine     Supine to sit: Max assist;+2 for physical assistance;HOB elevated Sit to supine: Total assist;+2 for physical assistance   General bed mobility comments: pt requires assistance to move LLE, able to mobilize RLE. Pt activates core to  elevate trunk with assist and utilizes RUE to push on bed    Transfers Overall transfer level:  (deferred as pt remains intubated, short IV lines)                  Ambulation/Gait                Stairs            Wheelchair Mobility    Modified Rankin (Stroke Patients Only) Modified Rankin (Stroke Patients Only) Pre-Morbid Rankin Score: No significant disability (difficult to assess, estimated based on chart review) Modified Rankin: Severe disability     Balance Overall balance assessment: Needs assistance Sitting-balance support: Single extremity supported;Feet unsupported Sitting balance-Leahy Scale: Poor Sitting balance - Comments: min-modA, unable to correct losses of balance, multiple anterior losses of balance                                     Pertinent Vitals/Pain Pain Assessment: No/denies pain    Home Living Family/patient expects to be discharged to:: Private residence Living Arrangements: Alone               Additional Comments: difficult to assess, pt remains intubated    Prior Function Level of Independence: Independent               Hand Dominance   Dominant Hand: Left    Extremity/Trunk Assessment   Upper Extremity Assessment Upper Extremity Assessment: Defer to OT evaluation    Lower Extremity Assessment Lower Extremity Assessment: LLE deficits/detail LLE Deficits / Details: flaccid LLE, PROM WFL LLE Sensation:  (difficult to assess 2/2 intubation)    Cervical / Trunk Assessment Cervical /  Trunk Assessment: Normal  Communication   Communication:  (intubated, pt communicates via head nods and thumbs up)  Cognition Arousal/Alertness: Awake/alert Behavior During Therapy: Flat affect Overall Cognitive Status: Difficult to assess                                 General Comments: pt follows one step commands well to move R side during session      General Comments General  comments (skin integrity, edema, etc.): 40% FiO2, 5 PEEP, VSS with mobility    Exercises     Assessment/Plan    PT Assessment Patient needs continued PT services  PT Problem List Decreased strength;Decreased activity tolerance;Decreased balance;Decreased mobility;Cardiopulmonary status limiting activity;Pain;Impaired sensation       PT Treatment Interventions DME instruction;Gait training;Stair training;Functional mobility training;Therapeutic activities;Therapeutic exercise;Balance training;Cognitive remediation;Neuromuscular re-education;Patient/family education;Wheelchair mobility training    PT Goals (Current goals can be found in the Care Plan section)  Acute Rehab PT Goals Patient Stated Goal: pt unable to state 2/2 intubated. PT goal to return to ambulation PT Goal Formulation: Patient unable to participate in goal setting Time For Goal Achievement: 01/22/21 Potential to Achieve Goals: Fair    Frequency Min 4X/week   Barriers to discharge        Co-evaluation PT/OT/SLP Co-Evaluation/Treatment: Yes Reason for Co-Treatment: Complexity of the patient's impairments (multi-system involvement);For patient/therapist safety;To address functional/ADL transfers;Necessary to address cognition/behavior during functional activity PT goals addressed during session: Mobility/safety with mobility;Balance;Strengthening/ROM         AM-PAC PT "6 Clicks" Mobility  Outcome Measure Help needed turning from your back to your side while in a flat bed without using bedrails?: Total Help needed moving from lying on your back to sitting on the side of a flat bed without using bedrails?: Total Help needed moving to and from a bed to a chair (including a wheelchair)?: Total Help needed standing up from a chair using your arms (e.g., wheelchair or bedside chair)?: Total Help needed to walk in hospital room?: Total Help needed climbing 3-5 steps with a railing? : Total 6 Click Score: 6    End  of Session Equipment Utilized During Treatment: Oxygen Activity Tolerance: Patient tolerated treatment well Patient left: in bed;with call bell/phone within reach;with bed alarm set;with restraints reapplied Nurse Communication: Mobility status;Need for lift equipment PT Visit Diagnosis: Other abnormalities of gait and mobility (R26.89);Muscle weakness (generalized) (M62.81);Other symptoms and signs involving the nervous system (R29.898)    Time: 1440-1501 PT Time Calculation (min) (ACUTE ONLY): 21 min   Charges:   PT Evaluation $PT Eval High Complexity: 1 High          Zenaida Niece, PT, DPT Acute Rehabilitation Pager: [phone]   Zenaida Niece 01/08/2021, 3:59 PM

## 2021-01-08 NOTE — Progress Notes (Signed)
Date and time results received: 01/08/21 0335   Test: BMP  Critical Value: Chloride >130 Sodium-159   Name of Provider Notified: DR Cheral Marker  Orders Received? Or Actions Taken?:  Stop 3% saline.  Recheck sodium in 6 hours

## 2021-01-08 NOTE — Progress Notes (Signed)
NAME:  Timothy Good, MRN:  [id number], DOB:  Nov 11, 1965, LOS: 2 ADMISSION DATE:  01/05/2021, CONSULTATION DATE:  01/18/2021 REFERRING MD:  Curly [place], CHIEF COMPLAINT:  found down   History of Present Illness:  55 y/o male with multiple medical problems was found down by in a well call visit by police and was brought to [facility] with a diagnosis of pontine hemorrhage.  He was intubated for airway protection and then transferred to [facility] for further evaluation.  Pulmonary and critical care medicine was consulted for medical management in the setting of acute hemorrhagic stroke.  The patient was unable to provide history because he was intubated.  Significant Hospital Events: Including procedures, antibiotic start and stop dates in addition to other pertinent events   January 06, 2021 admission, intubation  Interim History / Subjective:   Remains critically ill. Sedation held. Follows commands, left hemiparesis. Right UE movements.   Objective   Blood pressure 117/81, pulse (!) 52, temperature (!) 92.9 F (33.8 C), temperature source Rectal, resp. rate 13, height '6\' 2"'$  (1.88 m), weight 70.7 kg, SpO2 99 %.    Vent Mode: PSV;CPAP FiO2 (%):  [40 %] 40 % Set Rate:  [12 bmp] 12 bmp Vt Set:  [490 mL] 490 mL PEEP:  [5 cmH20] 5 cmH20 Pressure Support:  [5 cmH20] 5 cmH20 Plateau Pressure:  [11 cmH20-13 cmH20] 12 cmH20   Intake/Output Summary (Last 24 hours) at 01/08/2021 0934 Last data filed at 01/08/2021 0800 Gross per 24 hour  Intake 1926.86 ml  Output 695 ml  Net 1231.86 ml   Filed Weights   01/05/2021 1315 01/29/2021 1448 01/25/2021 1647  Weight: 101 kg 100.7 kg 70.7 kg    Examination: General: critically ill intubated on life support  HEENT: NCAT, tracking, opens to stimulation and voice  Neuro: left hemiplegic, right UE movements on commands  Chest: BL vented breaths  Heart: RRR, s1 s2  Abdomen: soft, nt nd  Skin: no rash   Resolved Hospital Problem list      Assessment & Plan:   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 uncontrolled hypertension Cerebral edema Acute encephalopathy due to hemorrhagic stroke Induced hypernatremia P: Neuro checks  Appreciate neuro sx and neuro team recs  BP management  Cleviprex if needed  Restarted oral BP meds  Hypertonic, manage Na+ at goal 150-155 RASS -1, PAD guidelines   Acute respiratory failure with hypoxemia acute hemorrhagic pontine stroke Probable aspiration pneumonia P:  Remains on vent  LTVV, lung protections SAT SBT as tolerated  Unasyn for aspiration  Follow up cx results  If neuro status improves could consider extubation trial   Chronic kidney disease stage IV Hypokalemia  P: Follow k replete as needed  Follow BMP and UOP   Uncontrolled hypertension Start oral BP meds  Monitor   Alcohol dependence Thiamine folate Watch for withdrawal   Probable hyperthyroidism TSH is low  Check t3 t4    Best Practice (right click and "Reselect all SmartList Selections" daily)   Diet/type: tubefeeds DVT prophylaxis: SCD GI prophylaxis: H2B Lines: N/A Foley:  Yes, and it is still needed Code Status:  full code Last date of multidisciplinary goals of care discussion [per primary]  Labs   CBC: Recent Labs  Lab 01/09/2021 1308 01/23/2021 1321 01/30/2021 1700 01/07/21 0300 01/08/21 0000  WBC 10.4  --   --  8.8 10.6*  NEUTROABS 9.4*  --   --   --   --  HGB 10.9* 11.9* 9.5* 9.0* 8.6*  HCT 33.4* 35.0* 28.0* 29.4* 28.8*  MCV 94.4  --   --  96.7 100.3*  PLT 251  --   --  189 [id number]    Basic Metabolic Panel: Recent Labs  Lab 01/05/2021 1308 01/29/2021 1321 01/05/2021 1411 01/24/2021 1506 01/16/2021 1700 01/11/2021 2109 01/07/21 0300 01/07/21 1037 01/07/21 1453 01/07/21 2229 01/08/21 0000  NA 141 142  --    < > 149*   < > 149* 153* 153* 158* 159*  K 3.8 3.3*  --   --  2.9*  --  2.8*  --   --   --  3.6  CL 105 108  --   --   --   --  118*  --   --   --   >130*  CO2 23  --   --   --   --   --  21*  --   --   --  17*  GLUCOSE 136* 118*  --   --   --   --  113*  --   --   --  116*  BUN 34* 38*  --   --   --   --  27*  --   --   --  27*  CREATININE 3.65* 3.50*  --   --   --   --  3.59*  --   --   --  3.80*  CALCIUM 9.1  --   --   --   --   --  8.9  --   --   --  9.0  MG  --   --  1.4*  --   --   --  2.2  --   --   --  2.2  PHOS  --   --   --   --   --   --  4.1  --   --   --   --    < > = values in this interval not displayed.   GFR: Estimated Creatinine Clearance: 22.5 mL/min (A) (by C-G formula based on SCr of 3.8 mg/dL (H)). Recent Labs  Lab 01/03/2021 1308 01/23/2021 1506 01/07/21 0300 01/08/21 0000  WBC 10.4  --  8.8 10.6*  LATICACIDVEN 2.0* 1.7  --   --     Liver Function Tests: Recent Labs  Lab 01/02/2021 1308 01/07/21 0300  AST 31  --   ALT 15  --   ALKPHOS 71  --   BILITOT 0.8  --   PROT 7.6  --   ALBUMIN 3.3* 2.5*   Recent Labs  Lab 01/29/2021 1308  LIPASE 28   No results for input(s): AMMONIA in the last 168 hours.  ABG    Component Value Date/Time   PHART 7.409 01/23/2021 1700   PCO2ART 35.7 01/14/2021 1700   PO2ART 96 01/23/2021 1700   HCO3 22.6 01/19/2021 1700   TCO2 24 01/09/2021 1700   ACIDBASEDEF 2.0 01/10/2021 1700   O2SAT 98.0 01/10/2021 1700     Coagulation Profile: Recent Labs  Lab 01/16/2021 1308  INR 1.0    Cardiac Enzymes: Recent Labs  Lab 01/15/2021 1801  CKTOTAL 419*    HbA1C: Hgb A1c MFr Bld  Date/Time Value Ref Range Status  01/07/2021 10:37 AM 5.4 4.8 - 5.6 % Final    Comment:    (NOTE) Pre diabetes:          5.7%-6.4%  Diabetes:              >  6.4%  Glycemic control for   <7.0% adults with diabetes     CBG: Recent Labs  Lab 01/07/21 1601 01/07/21 1942 01/07/21 2330 01/08/21 0334 01/08/21 0800  GLUCAP 111* 124* 120* 101* 96     This patient is critically ill with multiple organ system failure; which, requires frequent high complexity decision making, assessment,  support, evaluation, and titration of therapies. This was completed through the application of advanced monitoring technologies and extensive interpretation of multiple databases. During this encounter critical care time was devoted to patient care services described in this note for 33 minutes.   Garner Nash, DO [facility] 01/08/2021 9:34 AM

## 2021-01-08 NOTE — Progress Notes (Addendum)
Patient's Na is 161 from blood draw at 1800, increased from 159 at midnight, but stable since repeat level at 0816. Continues off hypertonic saline but with no fluids added during day shift. Appears dry per nursing.   Total maintenance fluid for patient of his weight would be 2514 mL/day. Do not want to hydrate too rapidly given his hypernatremia and risk for osmotic demyelination if corrected faster than 0.5 meq/L per hour or 10 meq/L per day. Starting tube flushes at 200 mL every 2 hours. Next Na level at 2 AM.   Addendum: Repeat Na level at 0139 is 159. Continue with current rate of tube flushes.   Electronically signed: Dr. Kerney Elbe

## 2021-01-08 NOTE — Progress Notes (Signed)
Spoke with neuro physician dr Cheral Marker to report a critical sodium of 161.  3% currently off.  Verbal order to give 222m free water flushes q2h.  Will recheck sodium again at 0200 and notify physician of the results.

## 2021-01-08 NOTE — Progress Notes (Signed)
STROKE TEAM PROGRESS NOTE   INTERVAL HISTORY Admitted after being found down in his home by police who were conducting welfare check requested by family. Intubated for airway protection. Had complained of GI discomfort for several days prior to admission.  No acute events overnight. Son at bedside.  Remains intubated, CCM weaning sedation with precedex.  On cleviprex. He is opening eyes and following commands today.  Sons say he drinks "too much liquor" daily but not sure how much and that he has not filled prescriptions or seen a doctor in over a year. He would like to be added to contact list, nurse to assist.  We discussed his stroke diagnosis, ongoing work up and plan of care. We reviewed imaging with son at bedside and answered all his questions.   Vitals:   01/08/21 1200 01/08/21 1300 01/08/21 1312 01/08/21 1400  BP: 116/78 118/76  127/78  Pulse: 63 63 76 67  Resp: '18 19 17 18  '$ Temp: 97.8 F (36.6 C)     TempSrc: Axillary     SpO2: 100% 100% 100% 100%  Weight:      Height:       CBC:  Recent Labs  Lab 01/24/2021 1308 01/01/2021 1321 01/07/21 0300 01/08/21 0000  WBC 10.4  --  8.8 10.6*  NEUTROABS 9.4*  --   --   --   HGB 10.9*   < > 9.0* 8.6*  HCT 33.4*   < > 29.4* 28.8*  MCV 94.4  --  96.7 100.3*  PLT 251  --  189 157   < > = values in this interval not displayed.    Basic Metabolic Panel:  Recent Labs  Lab 01/07/21 0300 01/07/21 1037 01/08/21 0000 01/08/21 0816  NA 149*   < > 159* 161*  K 2.8*  --  3.6  --   CL 118*  --  >130*  --   CO2 21*  --  17*  --   GLUCOSE 113*  --  116*  --   BUN 27*  --  27*  --   CREATININE 3.59*  --  3.80*  --   CALCIUM 8.9  --  9.0  --   MG 2.2  --  2.2  --   PHOS 4.1  --   --   --    < > = values in this interval not displayed.    Lipid Panel:  Recent Labs  Lab 01/08/21 0000 01/08/21 0817  CHOL 184  --   TRIG 71 76  HDL 95  --   CHOLHDL 1.9  --   VLDL 14  --   LDLCALC 75  --     HgbA1c:  Recent Labs  Lab  01/07/21 1037  HGBA1C 5.4    Urine Drug Screen:  Recent Labs  Lab 01/02/2021 1310  LABOPIA NONE DETECTED  COCAINSCRNUR NONE DETECTED  LABBENZ NONE DETECTED  AMPHETMU NONE DETECTED  THCU NONE DETECTED  LABBARB NONE DETECTED     Alcohol Level  Recent Labs  Lab 01/11/2021 1411  ETH <10     IMAGING past 24 hours No results found.  PHYSICAL EXAM Physical Exam  Constitutional: Appears thin, chronically ill Eyes: No scleral injection. Proptotic, which is new per family, right worse than left.  Right eye also has some exudate/clouding and is chemotic HENT: No oropharyngeal obstruction.  MSK: no joint deformities.  Cardiovascular: Tachycardia 100s, SR on cardiac monitor  Respiratory: Intubated, not labored,symmetric rise  Skin: Warm dry and  intact visible skin   Neuro: Mental Status: Patient is awake, intubated, off sedation. Following simple but not complex commands. Eyes open.  Cranial Nerves: II: Pupils are equal, round, and reactive to light.  2 mm -> 1 mm reactive, III,IV, VI: EOM w/ occular bobbing, mildly dysconjugate gaze V: Facial sensation corneals bilaterally intact VII: Facial movement is notable for upper face more reactive on the left than the right, lower face limited eval due to ETT VIII: hearing is intact to voice X/XI: Gag present  Motor: Antigravity on LUE, RUE weakly following commands with the hand, Antigravity LLE  and 2/3 RLE  Sensory: Less reactive on the left than the right Cerebellar: UTA     ASSESSMENT/PLAN Timothy Good is a 55 y.o. male with a past medical history significant for uncontrolled hypertension complicated by CKD stage IV, heart failure, hyperlipidemia, daily drinking, medication nonadherence, iron deficiency anemia/anemia of chronic disease, presenting with a pontine hemorrhage. ICH Score: 3. NIHSS 24. Family reported he had some difficulty swallowing for a few days and had been complaining of GI discomfort.   Stroke: Pontine  hemorrhage with intraventricular hemorrhage likely due to uncontrolled severe hypertension.   Code Stroke  1. Examination is positive for hemorrhagic infarct within the brainstem. 2. Posterior extension of clot into the fourth ventricle and the cisterna magna. 3. Chronic small vessel ischemic disease and brain atrophy. MRI   1. Acute brainstem hemorrhage centered at the pons with estimated intra-axial blood volume of 10 mL. Extensive pontine edema, which tracks associated edema which tracks into the midbrain right > left and into the right medullary pyramid. 2. Mild extension of hemorrhage into the 4th ventricle and basilar subarachnoid spaces. But no ventriculomegaly. No midline shift or impending herniation. 3. Underlying advanced chronic small vessel disease including multiple chronic microhemorrhages in the cerebellum and bilateral deep gray matter nuclei. Advanced bilateral cerebral white matter ischemia. 4. Negative noncontrast MRI appearance of the orbits. MR Orbits Negative noncontrast MRI appearance of the orbits.  2D Echo  1. Left ventricular ejection fraction, by estimation, is 55 to 60%. The  left ventricle has normal function. The left ventricle demonstrates  regional wall motion abnormalities. Basal inferior akinesis.There is moderate asymmetric left ventricular  hypertrophy of the basal-septal segment. Left ventricular diastolic parameters are consistent with Grade I diastolic dysfunction (impaired relaxation).   2. Right ventricular systolic function is normal. The right ventricular  size is normal. Tricuspid regurgitation signal is inadequate for assessing PA pressure.   3. The mitral valve is normal in structure. No evidence of mitral valve regurgitation.   4. The aortic valve was not well visualized. Aortic valve regurgitation is mild. No aortic stenosis is present.   5. The inferior vena cava is normal in size with greater than 50%  Resp **The interatrial septum was  not well visualized.   CT Chest Abd Pelvis 1. Suggestion of generalized gastric wall thickening, poorly assessed on this scan due to gastric decompression by enteric tube, with associated fat stranding in the gastrohepatic ligament. Findings are nonspecific and could be due to noninflammatory edema,gastritis, peptic ulcer disease or neoplasm. Consider upper endoscopic correlation as clinically warranted. 2. Small volume ascites, predominantly perihepatic. 3. Prominently distended gallbladder. No radiopaque cholelithiasis. No gallbladder wall thickening. No pericholecystic fluid. No biliary ductal dilatation. 4. Moderate diffuse colonic diverticulosis. 5. Mild to moderate dependent bibasilar atelectasis. 6. Coronary atherosclerosis. 7. Chronic appearing mild T10 vertebral compression fracture. 8. Aortic Atherosclerosis (ICD10-I70.0).  LDL 75 HgbA1c 5.4 VTE prophylaxis -  SCDs    Diet   Diet NPO time specified   Not on Anticoagulant or antiplatelet prior to admission Not taking Rx medications prior to admission Hold anticoagulant/antiplatelet medications in the setting of hemorrhage Therapy recommendations:  pending patient status  Disposition:  TBD       Cerebral Edema Neurosurgery consulted, no acute intervention recommended at this time. Hypertonic saline infusion with sodium goal 150-155 has been held overnight due to chloride >130.  Na 149->147->153->158->159       Acute respiratory failure      Probable aspiration pneumonia Remains intubated, appreciate CCM management Unasyn IV on board  Resp culture pending Weaning precedex        Hypothermia Warming/management per CCM Unclear source       Bradycardia  ?Precedex, CCM weaning Continue cardiac monitoring   Hypertension Home meds: not taking any prescribed meds >1 year  Unstable Keep systolic BP less than [id number]  Long-term BP goal normotensive  Hyperlipidemia Home meds:  None LDL  75, almost at goal < 70 High  intensity statin to be addressed at discharge Continue statin at discharge       Glycemic management No hx of DM2  HgbA1c  5.4, at goal < 7.0 CBGs Recent Labs    01/08/21 0334 01/08/21 0800 01/08/21 1236  GLUCAP 101* 96 81    SSI        Hypokalemia Monitor and replete as needed 3.9->2.8->3.6       ETOH use disorder with dependence Daily use of excessive liquor per family (?1/2 bottle vodka)  Thiamine, folate and MVI on board Precedex on board  Monitor magnesium, 2.2->2.2 Vitamin B12 -213 Monitor s/s of withdrawal, need for CIWA        Feeding      Dysphagia RD consult in for tube feeding and free water recs, to be started today        Abd discomfort on presentation CT, Chest Abd Pelvis: generalized gastric wall thickening, with associated fat stranding in the gastrohepatic ligament.Findings are nonspecific and could be due to noninflammatory edema,gastritis, peptic ulcer disease or neoplasm.  CCM, Dr. Valeta Harms: nothing acutely concerning   Other Stroke Risk Factors Current Cigarette smoker, will be advised to stop smoking ETOH use disorder alcohol level <10, advised to drink no more than 2 drink(s) a day  Other Active Problems ? Hyperthyroidism, outpatient follow up, T3 pending and T4 wnl  Hospital day # 2 This patient was seen and evaluated with Dr. Reeves Forth. He directed the plan of care.  Charlene Brooke, NP-C   To contact Stroke Continuity provider, please refer to [email or web address] After hours, contact General Neurology

## 2021-01-08 NOTE — Progress Notes (Addendum)
STROKE TEAM PROGRESS NOTE   INTERVAL HISTORY Admitted after being found down in his home by police who were conducting welfare check requested by family. Intubated for airway protection. Had complained of GI discomfort for several days prior to admission.  No acute events overnight. Son at bedside.  Remains intubated, CCM weaning sedation with precedex.  On cleviprex. He is opening eyes and following commands today.  Sons say he drinks "too much liquor" daily but not sure how much and that he has not filled prescriptions or seen a doctor in over a year. He would like to be added to contact list, nurse to assist.  We discussed his stroke diagnosis, ongoing work up and plan of care. We reviewed imaging with son at bedside and answered all his questions.   Vitals:   01/08/21 0800 01/08/21 0828 01/08/21 0900 01/08/21 1000  BP: 117/75  117/81 109/76  Pulse: (!) 49 (!) 54 (!) 52 (!) 54  Resp: (!) '8 15 13 16  '$ Temp: (!) 92.9 F (33.8 C)     TempSrc: Rectal     SpO2: 100% 100% 99% 100%  Weight:      Height:       CBC:  Recent Labs  Lab 01/26/2021 1308 01/26/2021 1321 01/07/21 0300 01/08/21 0000  WBC 10.4  --  8.8 10.6*  NEUTROABS 9.4*  --   --   --   HGB 10.9*   < > 9.0* 8.6*  HCT 33.4*   < > 29.4* 28.8*  MCV 94.4  --  96.7 100.3*  PLT 251  --  189 157   < > = values in this interval not displayed.   Basic Metabolic Panel:  Recent Labs  Lab 01/07/21 0300 01/07/21 1037 01/07/21 2229 01/08/21 0000  NA 149*   < > 158* 159*  K 2.8*  --   --  3.6  CL 118*  --   --  >130*  CO2 21*  --   --  17*  GLUCOSE 113*  --   --  116*  BUN 27*  --   --  27*  CREATININE 3.59*  --   --  3.80*  CALCIUM 8.9  --   --  9.0  MG 2.2  --   --  2.2  PHOS 4.1  --   --   --    < > = values in this interval not displayed.   Lipid Panel:  Recent Labs  Lab 01/08/21 0000 01/08/21 0817  CHOL 184  --   TRIG 71 76  HDL 95  --   CHOLHDL 1.9  --   VLDL 14  --   LDLCALC 75  --    HgbA1c:  Recent  Labs  Lab 01/07/21 1037  HGBA1C 5.4   Urine Drug Screen:  Recent Labs  Lab 01/19/2021 1310  LABOPIA NONE DETECTED  COCAINSCRNUR NONE DETECTED  LABBENZ NONE DETECTED  AMPHETMU NONE DETECTED  THCU NONE DETECTED  LABBARB NONE DETECTED    Alcohol Level  Recent Labs  Lab 01/22/2021 1411  ETH <10    IMAGING past 24 hours CT ABDOMEN PELVIS WO CONTRAST  Result Date: 01/07/2021 CLINICAL DATA:  Inpatient. Acute nonlocalized abdominal pain. Pontine hemorrhage. EXAM: CT ABDOMEN AND PELVIS WITHOUT CONTRAST TECHNIQUE: Multidetector CT imaging of the abdomen and pelvis was performed following the standard protocol without IV contrast. COMPARISON:  01/16/2021 abdominal radiograph. FINDINGS: Lower chest: Mild to moderate dependent bibasilar atelectasis. Coronary atherosclerosis. Hepatobiliary: Normal liver size. No liver  mass. Prominently distended (6.4 cm diameter) gallbladder. No gallbladder wall thickening. No pericholecystic fluid. No radiopaque cholelithiasis. No biliary ductal dilatation. CBD diameter 5 mm. Pancreas: No pancreatic duct dilation or discrete pancreatic mass. Mild peripancreatic fat stranding at the pancreatic head. No peripancreatic fluid collections. Spleen: Normal size. No mass. Adrenals/Urinary Tract: No discrete adrenal nodules. No hydronephrosis. No renal stones. Simple 1.8 cm lower right renal cyst. Scattered subcentimeter hyperdense and hypodense bilateral renal cortical lesions are too small to characterize and require no follow-up. Bladder completely collapsed by indwelling Foley catheter with expected gas in the nondependent bladder lumen. Stomach/Bowel: Enteric tube terminates in duodenal bulb. Stomach is collapsed. Suggestion of generalized gastric wall thickening, poorly assessed on this scan, with associated fat stranding in the gastrohepatic ligament. Normal caliber small bowel with no small bowel wall thickening. Normal appendix. Moderate diffuse colonic diverticulosis, with  no large bowel wall thickening or significant pericolonic fat stranding. Vascular/Lymphatic: Atherosclerotic nonaneurysmal abdominal aorta. No pathologically enlarged lymph nodes in the abdomen or pelvis. Reproductive: Normal size prostate. Other: No pneumoperitoneum. Small volume ascites, predominantly perihepatic. No focal fluid collections. Symmetric mild-to-moderate bilateral gynecomastia. Musculoskeletal: No aggressive appearing focal osseous lesions. Mild thoracolumbar spondylosis. Chronic appearing mild T10 vertebral compression fracture. IMPRESSION: 1. Suggestion of generalized gastric wall thickening, poorly assessed on this scan due to gastric decompression by enteric tube, with associated fat stranding in the gastrohepatic ligament. Findings are nonspecific and could be due to noninflammatory edema, gastritis, peptic ulcer disease or neoplasm. Consider upper endoscopic correlation as clinically warranted. 2. Small volume ascites, predominantly perihepatic. 3. Prominently distended gallbladder. No radiopaque cholelithiasis. No gallbladder wall thickening. No pericholecystic fluid. No biliary ductal dilatation. 4. Moderate diffuse colonic diverticulosis. 5. Mild to moderate dependent bibasilar atelectasis. 6. Coronary atherosclerosis. 7. Chronic appearing mild T10 vertebral compression fracture. 8. Aortic Atherosclerosis (ICD10-I70.0). Electronically Signed   By: Ilona Sorrel M.D.   On: 01/07/2021 13:14   CT HEAD WO CONTRAST (5MM)  Result Date: 01/07/2021 CLINICAL DATA:  Stroke, follow-up 6 hour stability scan. EXAM: CT HEAD WITHOUT CONTRAST TECHNIQUE: Contiguous axial images were obtained from the base of the skull through the vertex without intravenous contrast. COMPARISON:  Brain MRI 01/07/2021. head CT 01/29/2021. FINDINGS: Brain: Mild generalized cerebral atrophy. Stable size of an acute parenchymal hemorrhage within the pons, again measuring 2.8 x 2.1 cm in transaxial dimensions and 2.6 cm in  craniocaudal dimension. As before, there is surrounding edema within the brainstem with mass effect and partial fourth ventricular effacement. Unchanged small volume extension of hemorrhage into the fourth ventricle and posterior fossa subarachnoid spaces. No evidence of obstructive hydrocephalus at this time. Stable supratentorial chronic small vessel ischemic disease. No demarcated cortical infarct. Vascular: No hyperdense vessel.  Atherosclerotic calcifications. Skull: Normal. Negative for fracture or focal lesion. Sinuses/Orbits: Visualized orbits show no acute finding. Mild mucosal thickening within the bilateral ethmoid, sphenoid and maxillary sinuses. IMPRESSION: Stable size of an acute parenchymal hemorrhage within the pons. As before, there is surrounding edema within the brainstem with mass effect and partial effacement of the fourth ventricle. No evidence of obstructive hydrocephalus at this time. Also unchanged, there is small-volume extension of hemorrhage into the fourth ventricle and posterior fossa subarachnoid spaces. Stable background cerebral atrophy and chronic supratentorial chronic small vessel ischemic disease. Electronically Signed   By: Kellie Simmering D.O.   On: 01/07/2021 11:42   ECHOCARDIOGRAM COMPLETE  Result Date: 01/07/2021    ECHOCARDIOGRAM REPORT   Patient Name:   KAEGEN NOVAKOVICH Date of Exam:  01/07/2021 Medical Rec #:  [id number]        Height:       74.0 in Accession #:    [id number]       Weight:       155.9 lb Date of Birth:  1965-10-14         BSA:          1.955 m Patient Age:    71 years         BP:           114/80 mmHg Patient Gender: M                HR:           92 bpm. Exam Location:  Inpatient Procedure: 2D Echo, Cardiac Doppler, Color Doppler and 3D Echo Indications:    Stroke I63.9  History:        Patient has prior history of Echocardiogram examinations, most                 recent 06/16/2015. CHF; Risk Factors:Hypertension. Chronic kidney                 disease.   Sonographer:    Darlina Sicilian RDCS Referring Phys: [id number] [place]  1. Left ventricular ejection fraction, by estimation, is 55 to 60%. The left ventricle has normal function. The left ventricle demonstrates regional wall motion abnormalities. Basal inferior akinesis.There is moderate asymmetric left ventricular hypertrophy of the basal-septal segment. Left ventricular diastolic parameters are consistent with Grade I diastolic dysfunction (impaired relaxation).  2. Right ventricular systolic function is normal. The right ventricular size is normal. Tricuspid regurgitation signal is inadequate for assessing PA pressure.  3. The mitral valve is normal in structure. No evidence of mitral valve regurgitation.  4. The aortic valve was not well visualized. Aortic valve regurgitation is mild. No aortic stenosis is present.  5. The inferior vena cava is normal in size with greater than 50% respiratory variability, suggesting right atrial pressure of 3 mmHg. FINDINGS  Left Ventricle: Left ventricular ejection fraction, by estimation, is 55 to 60%. The left ventricle has normal function. The left ventricle demonstrates regional wall motion abnormalities. The left ventricular internal cavity size was normal in size. There is moderate asymmetric left ventricular hypertrophy of the basal-septal segment. Left ventricular diastolic parameters are consistent with Grade I diastolic dysfunction (impaired relaxation). Right Ventricle: The right ventricular size is normal. No increase in right ventricular wall thickness. Right ventricular systolic function is normal. Tricuspid regurgitation signal is inadequate for assessing PA pressure. Left Atrium: Left atrial size was normal in size. Right Atrium: Right atrial size was normal in size. Pericardium: There is no evidence of pericardial effusion. Mitral Valve: The mitral valve is normal in structure. No evidence of mitral valve regurgitation. Tricuspid Valve: The  tricuspid valve is normal in structure. Tricuspid valve regurgitation is trivial. Aortic Valve: The aortic valve was not well visualized. Aortic valve regurgitation is mild. No aortic stenosis is present. Pulmonic Valve: The pulmonic valve was not well visualized. Pulmonic valve regurgitation is not visualized. Aorta: The aortic root is normal in size and structure. Venous: The inferior vena cava is normal in size with greater than 50% respiratory variability, suggesting right atrial pressure of 3 mmHg. IAS/Shunts: The interatrial septum was not well visualized.  LEFT VENTRICLE PLAX 2D LVIDd:         5.80 cm   Diastology LVIDs:  3.70 cm   LV e' medial:    6.64 cm/s LV PW:         0.95 cm   LV E/e' medial:  5.8 LV IVS:        0.95 cm   LV e' lateral:   7.62 cm/s LVOT diam:     2.30 cm   LV E/e' lateral: 5.1 LV SV:         66 LV SV Index:   34 LVOT Area:     4.15 cm                           3D Volume EF:                          3D EF:        50 %                          LV EDV:       109 ml                          LV ESV:       54 ml                          LV SV:        55 ml RIGHT VENTRICLE RV S prime:     18.30 cm/s TAPSE (M-mode): 1.7 cm LEFT ATRIUM             Index        RIGHT ATRIUM          Index LA diam:        3.10 cm 1.59 cm/m   RA Area:     7.93 cm LA Vol (A2C):   26.9 ml 13.76 ml/m  RA Volume:   10.80 ml 5.52 ml/m LA Vol (A4C):   25.7 ml 13.15 ml/m LA Biplane Vol: 25.6 ml 13.10 ml/m  AORTIC VALVE LVOT Vmax:   88.80 cm/s LVOT Vmean:  61.000 cm/s LVOT VTI:    0.159 m  AORTA Ao Root diam: 3.70 cm MITRAL VALVE MV Area (PHT): 3.42 cm    SHUNTS MV Decel Time: 222 msec    Systemic VTI:  0.16 m MV E velocity: 38.60 cm/s  Systemic Diam: 2.30 cm MV A velocity: 56.60 cm/s MV E/A ratio:  0.68 Oswaldo Milian MD Electronically signed by Oswaldo Milian MD Signature Date/Time: 01/07/2021/4:27:39 PM    Final     PHYSICAL EXAM Physical Exam  Constitutional: Appears thin, chronically  ill Eyes: No scleral injection. Proptotic, which is new per family, right worse than left.  Right eye also has some exudate/clouding and is chemotic HENT: No oropharyngeal obstruction.  MSK: no joint deformities.  Cardiovascular: Tachycardia 100s, SR on cardiac monitor  Respiratory: Intubated, not labored,symmetric rise  Skin: Warm dry and intact visible skin   Neuro: Mental Status: Patient is awake, intubated, off sedation. Following simple but not complex commands. Eyes open.  Cranial Nerves: II: Pupils are equal, round, and reactive to light.  2 mm -> 1 mm reactive, III,IV, VI: EOM w/ occular bobbing, mildly dysconjugate gaze V: Facial sensation corneals bilaterally intact VII: Facial movement is notable for upper face more reactive on the left than the right, lower face limited eval due  to ETT VIII: hearing is intact to voice X/XI: Gag present  Motor: Antigravity on LUE, RUE weakly following commands with the hand, Antigravity LLE  and 2/3 RLE  Sensory: Less reactive on the left than the right Cerebellar: UTA     ASSESSMENT/PLAN Timothy Good is a 55 y.o. male with a past medical history significant for uncontrolled hypertension complicated by CKD stage IV, heart failure, hyperlipidemia, daily drinking, medication nonadherence, iron deficiency anemia/anemia of chronic disease, presenting with a pontine hemorrhage. ICH Score: 3. NIHSS 24. Family reported he had some difficulty swallowing for a few days and had been complaining of GI discomfort.   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   Code Stroke  1. Examination is positive for hemorrhagic infarct within the brainstem. 2. Posterior extension of clot into the fourth ventricle and the cisterna magna. 3. Chronic small vessel ischemic disease and brain atrophy. MRI   1. Acute brainstem hemorrhage centered at the pons with estimated intra-axial blood volume  of 10 mL. Extensive pontine edema, which tracks associated edema which tracks into the midbrain right > left and into the right medullary pyramid. 2. Mild extension of hemorrhage into the 4th ventricle and basilar subarachnoid spaces. But no ventriculomegaly. No midline shift or impending herniation. 3. Underlying advanced chronic small vessel disease including multiple chronic microhemorrhages in the cerebellum and bilateral deep gray matter nuclei. Advanced bilateral cerebral white matter ischemia. 4. Negative noncontrast MRI appearance of the orbits. MR Orbits Negative noncontrast MRI appearance of the orbits.  2D Echo  1. Left ventricular ejection fraction, by estimation, is 55 to 60%. The  left ventricle has normal function. The left ventricle demonstrates  regional wall motion abnormalities. Basal inferior akinesis.There is moderate asymmetric left ventricular  hypertrophy of the basal-septal segment. Left ventricular diastolic parameters are consistent with Grade I diastolic dysfunction (impaired relaxation).   2. Right ventricular systolic function is normal. The right ventricular  size is normal. Tricuspid regurgitation signal is inadequate for assessing PA pressure.   3. The mitral valve is normal in structure. No evidence of mitral valve regurgitation.   4. The aortic valve was not well visualized. Aortic valve regurgitation is mild. No aortic stenosis is present.   5. The inferior vena cava is normal in size with greater than 50%  Resp **The interatrial septum was not well visualized.   CT Chest Abd Pelvis: defer to CCM to manage. 1. Suggestion of generalized gastric wall thickening, poorly assessed on this scan due to gastric decompression by enteric tube, with associated fat stranding in the gastrohepatic ligament. Findings are nonspecific and could be due to noninflammatory edema,gastritis, peptic ulcer disease or neoplasm. Consider upper endoscopic correlation as clinically  warranted. 2. Small volume ascites, predominantly perihepatic. 3. Prominently distended gallbladder. No radiopaque cholelithiasis. No gallbladder wall thickening. No pericholecystic fluid. No biliary ductal dilatation. 4. Moderate diffuse colonic diverticulosis. 5. Mild to moderate dependent bibasilar atelectasis. 6. Coronary atherosclerosis. 7. Chronic appearing mild T10 vertebral compression fracture. 8. Aortic Atherosclerosis (ICD10-I70.0).  LDL 75 HgbA1c 5.4 VTE prophylaxis - SCDs    Diet   Diet NPO time specified  Not on Anticoagulant or antiplatelet prior to admission Not taking Rx medications prior to admission Hold anticoagulant/antiplatelet medications in the setting of hemorrhage Therapy recommendations:  pending patient status  Disposition:  TBD       Cerebral Edema Neurosurgery consulted, no acute intervention recommended at this time. Hypertonic saline infusion with sodium goal 150-155 has been  held overnight due to chloride >130.  Na 149->147->153->158->159       Acute respiratory failure      Probable aspiration pneumonia Remains intubated, appreciate CCM management Unasyn IV on board  Resp culture pending Weaning precedex        Hypothermia Warming/management per CCM Unclear source       Bradycardia  ?Precedex, CCM weaning Continue cardiac monitoring   Hypertension Home meds: not taking any prescribed meds >1 year  Unstable Keep systolic BP less than [id number]  Long-term BP goal normotensive  Hyperlipidemia Home meds:  None LDL  75, almost at goal < 70 High intensity statin to be addressed at discharge Continue statin at discharge       Glycemic management No hx of DM2  HgbA1c  5.4, at goal < 7.0 CBGs Recent Labs    01/07/21 2330 01/08/21 0334 01/08/21 0800  GLUCAP 120* 101* 96    SSI        Hypokalemia Monitor and replete as needed 3.9->2.8->3.6       ETOH use disorder with dependence Daily use of excessive liquor per family (?1/2  bottle vodka)  Thiamine, folate and MVI on board Precedex on board  Monitor magnesium, 2.2->2.2 Vitamin B12 -213 Monitor s/s of withdrawal, need for CIWA        Abd discomfort on presentation CT, Chest Abd Pelvis: generalized gastric wall thickening, with associated fat stranding in the gastrohepatic ligament.Findings are nonspecific and could be due to noninflammatory edema,gastritis, peptic ulcer disease or neoplasm.  CCM, Dr. Valeta Harms: nothing acutely concerning   Other Stroke Risk Factors Current Cigarette smoker, will be advised to stop smoking ETOH use disorder alcohol level <10, advised to drink no more than 2 drink(s) a day  Other Active Problems possible Hyperthyroidism, outpatient follow up  ATTENDING ATTESTATION:  This patient was found down by police with history of alcohol abuse has a pontine hemorrhage.  CT abdomen pelvis ordered by admitting physician-Will defer to CCM to help manage.  We will continue to watch blood pressure closely, prognosis is poor  I evaluated pt independently, reviewed imaging, chart, labs. Discussed and formulated plan with the APP. Please see APP note above for details.      This patient is critically ill due to respiratory distress, stroke s/p tPA and at significant risk of neurological worsening, death form heart failure, respiratory failure, recurrent stroke, bleeding from [facility], seizure, sepsis. This patient's care requires constant monitoring of vital signs, hemodynamics, respiratory and cardiac monitoring, review of multiple databases, neurological assessment, discussion with family, other specialists and medical decision making of high complexity. I spent 35 minutes of neurocritical care time in the care of this patient.   [facility] day # 2 This patient was seen and evaluated with Dr. Reeves Forth. He directed the plan of care.  Charlene Brooke, NP-C   To contact Stroke Continuity provider, please refer to [email or web address] After  hours, contact General Neurology

## 2021-01-08 NOTE — Progress Notes (Signed)
Inpatient Rehab Admissions Coordinator Note:   Per PT/OT patient was screened for CIR candidacy by Ausar Georgiou Danford Bad, CCC-SLP. At this time, pt is not medically ready for CIR. Pt is intubated. AC will not pursue a rehab consult at this time. CIR admissions team will follow and monitor for medical readiness and place a consult order if pt appears to be an appropriate candidate at that time.     Timothy Good, Timothy Good, Timothy Good Admissions Coordinator [phone] 01/08/21 6:23 PM

## 2021-01-09 ENCOUNTER — Inpatient Hospital Stay (HOSPITAL_COMMUNITY): Payer: Medicaid Other

## 2021-01-09 DIAGNOSIS — I613 Nontraumatic intracerebral hemorrhage in brain stem: Secondary | ICD-10-CM | POA: Diagnosis not present

## 2021-01-09 LAB — GLUCOSE, CAPILLARY
Glucose-Capillary: 70 mg/dL (ref 70–99)
Glucose-Capillary: 73 mg/dL (ref 70–99)
Glucose-Capillary: 73 mg/dL (ref 70–99)
Glucose-Capillary: 83 mg/dL (ref 70–99)
Glucose-Capillary: 89 mg/dL (ref 70–99)
Glucose-Capillary: 93 mg/dL (ref 70–99)
Glucose-Capillary: 99 mg/dL (ref 70–99)

## 2021-01-09 LAB — BASIC METABOLIC PANEL
BUN: 35 mg/dL — ABNORMAL HIGH (ref 6–20)
CO2: 16 mmol/L — ABNORMAL LOW (ref 22–32)
Calcium: 8.8 mg/dL — ABNORMAL LOW (ref 8.9–10.3)
Chloride: >130 mmol/L (ref 98–111)
Creatinine, Ser: 5.2 mg/dL — ABNORMAL HIGH (ref 0.61–1.24)
GFR, Estimated: 12 mL/min — ABNORMAL LOW (ref >60)
Glucose, Bld: 80 mg/dL (ref 70–99)
Potassium: 3.6 mmol/L (ref 3.5–5.1)
Sodium: 159 mmol/L — ABNORMAL HIGH (ref 135–145)

## 2021-01-09 LAB — T3: T3, Total: 45 ng/dL — ABNORMAL LOW (ref 71–180)

## 2021-01-09 LAB — CK: Total CK: 676 U/L — ABNORMAL HIGH (ref 49–397)

## 2021-01-09 LAB — CULTURE, RESPIRATORY W GRAM STAIN: Culture: NORMAL

## 2021-01-09 LAB — MAGNESIUM
Magnesium: 2.1 mg/dL (ref 1.7–2.4)
Magnesium: 2.3 mg/dL (ref 1.7–2.4)

## 2021-01-09 LAB — PHOSPHORUS
Phosphorus: 6.1 mg/dL — ABNORMAL HIGH (ref 2.5–4.6)
Phosphorus: 3.6 mg/dL (ref 2.5–4.6)

## 2021-01-09 LAB — SODIUM: Sodium: 158 mmol/L — ABNORMAL HIGH (ref 135–145)

## 2021-01-09 IMAGING — DX DG ABD PORTABLE 1V
1 series · 1 of 1 positions shown · non-contrast
Comparison: KUB [DATE]

CLINICAL DATA: NG tube placement

EXAM:
PORTABLE ABDOMEN - 1 VIEW

[abdomen kub]
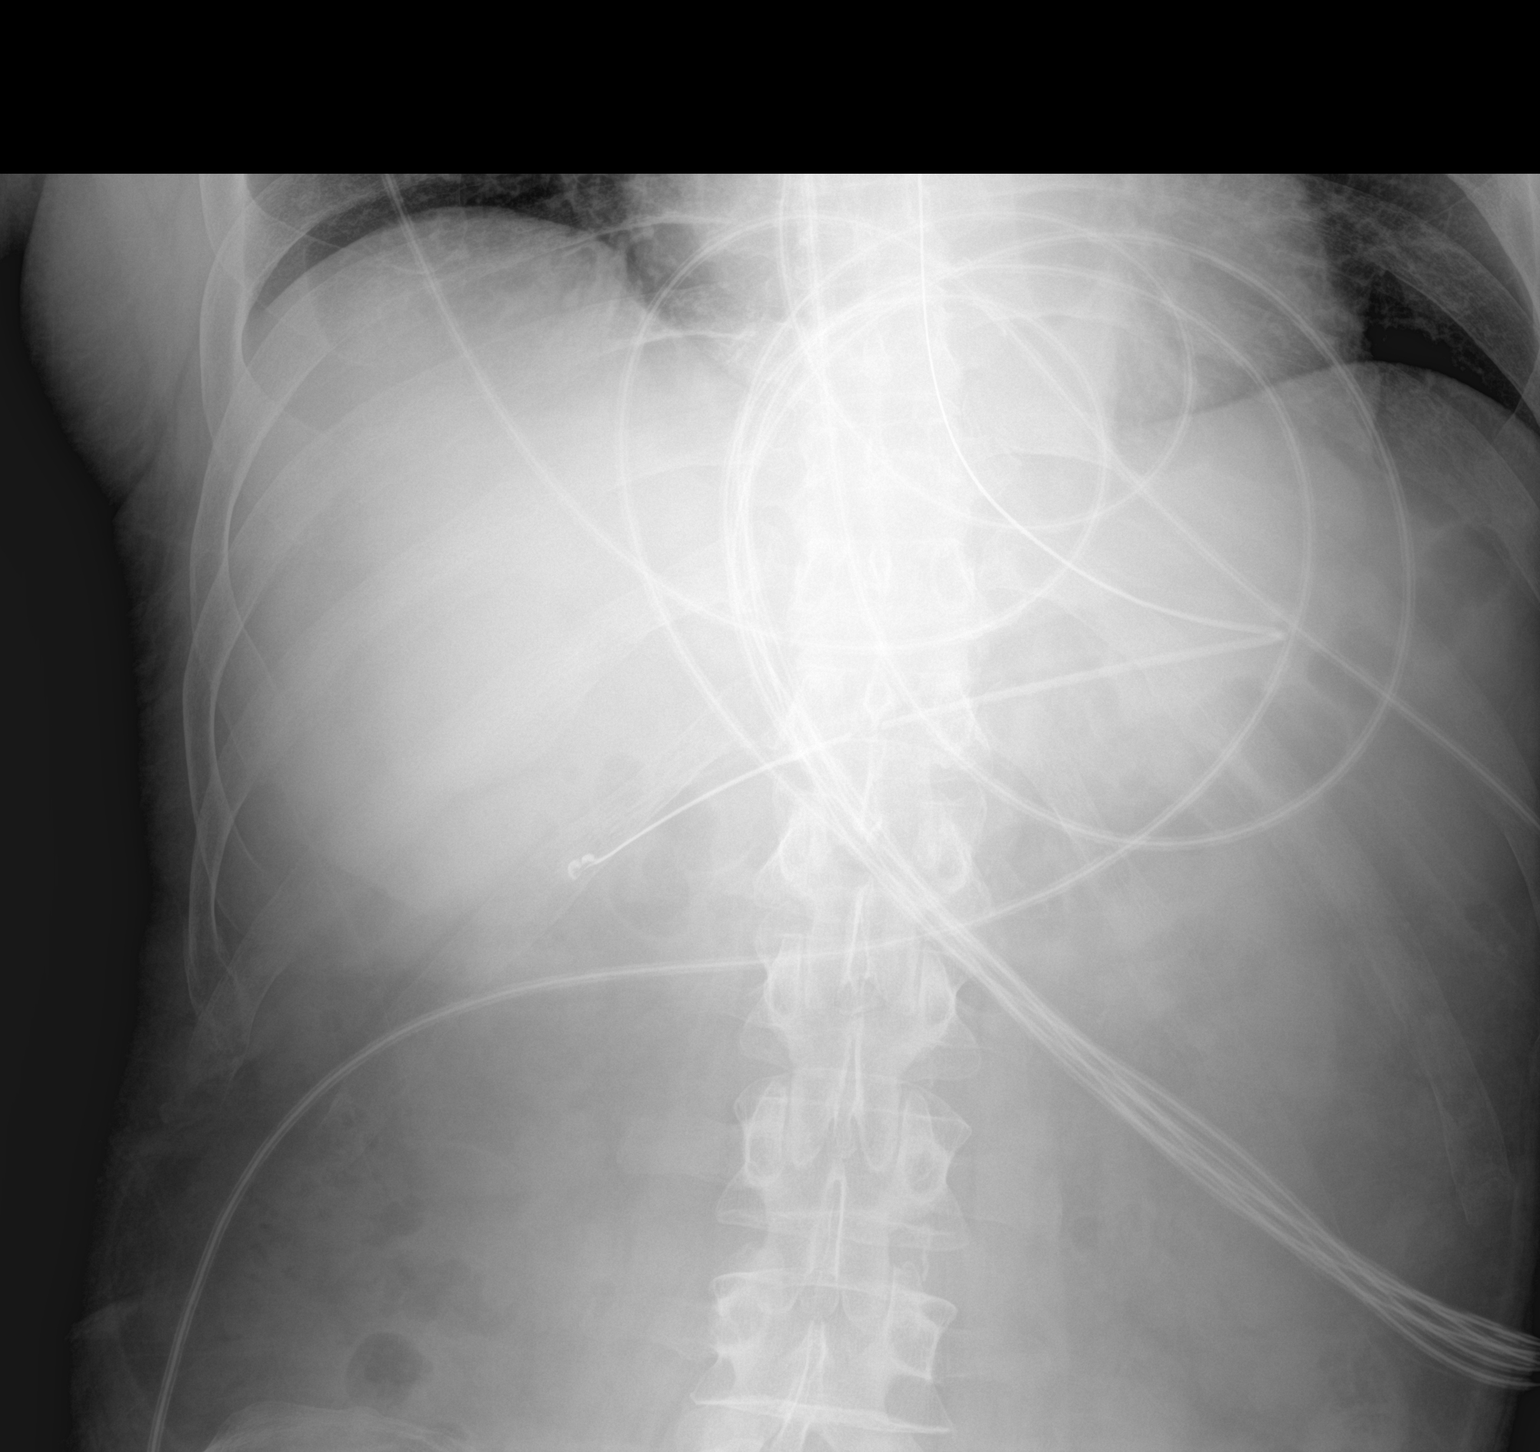

[1 of 1 positions shown; findings below may reference images not displayed]

FINDINGS: The enteric catheter tip projects over the expected location of the
pylorus/first portion of the duodenum. The side hole projects over
the distal stomach.

There is a nonobstructive bowel gas pattern to the level imaged. The
lung bases are clear.
IMPRESSION: Enteric catheter in appropriate position.

## 2021-01-09 MED ORDER — LACTATED RINGERS IV SOLN: INTRAVENOUS | Status: DC

## 2021-01-09 MED ORDER — PROSOURCE TF PO LIQD
45.0000 mL | Freq: Two times a day (BID) | ORAL | Status: DC
  Administered 2021-01-09 – 2021-01-20 (×23): 45 mL
  Filled 2021-01-09 (×23): qty 45

## 2021-01-09 MED ORDER — LACTATED RINGERS IV SOLN: INTRAVENOUS | Status: AC

## 2021-01-09 MED ORDER — VITAL HIGH PROTEIN PO LIQD: 1000.0000 mL | ORAL | Status: DC

## 2021-01-09 MED ORDER — VITAL 1.5 CAL PO LIQD
1000.0000 mL | ORAL | Status: DC
  Administered 2021-01-09 – 2021-01-16 (×8): 1000 mL
  Filled 2021-01-09 (×2): qty 1000

## 2021-01-09 MED ORDER — PANTOPRAZOLE 2 MG/ML SUSPENSION
40.0000 mg | Freq: Every day | ORAL | Status: DC
  Administered 2021-01-10 – 2021-01-28 (×19): 40 mg
  Filled 2021-01-09 (×19): qty 20

## 2021-01-09 MED ORDER — ALBUMIN HUMAN 25 % IV SOLN
25.0000 g | Freq: Once | INTRAVENOUS | Status: AC
  Administered 2021-01-09: 25 g via INTRAVENOUS
  Filled 2021-01-09: qty 100

## 2021-01-09 NOTE — Progress Notes (Signed)
SLP Cancellation Note  Patient Details Name: SAKETH FERRAND MRN: [id number] DOB: [birth year]   Cancelled treatment:       Reason Eval/Treat Not Completed: Medical issues which prohibited therapy;Other (comment) remains intubated. Will follow for readiness.    Christoph Copelan, Katherene Ponto 01/09/2021, 7:52 AM

## 2021-01-09 NOTE — Plan of Care (Signed)
  Problem: Self-Care: Goal: Ability to communicate needs accurately will improve Outcome: Progressing   Problem: Nutrition: Goal: Dietary intake will improve Outcome: Progressing   Problem: Safety: Goal: Non-violent Restraint(s) Outcome: Progressing   Problem: Nutrition: Goal: Adequate nutrition will be maintained Outcome: Progressing   Problem: Skin Integrity: Goal: Risk for impaired skin integrity will decrease Outcome: Progressing   Problem: Activity: Goal: Risk for activity intolerance will decrease Outcome: Not Progressing

## 2021-01-09 NOTE — Progress Notes (Signed)
NAME:  Timothy Good, MRN:  [id number], DOB:  [birth year], LOS: 3 ADMISSION DATE:  01/23/2021, CONSULTATION DATE:  01/28/2021 REFERRING MD:  Curly [place], CHIEF COMPLAINT:  found down   History of Present Illness:  55 y/o male with multiple medical problems was found down by in a well call visit by police and was brought to [facility] with a diagnosis of pontine hemorrhage.  He was intubated for airway protection and then transferred to [facility] for further evaluation.  Pulmonary and critical care medicine was consulted for medical management in the setting of acute hemorrhagic stroke.  The patient was unable to provide history because he was intubated.  Significant Hospital Events: Including procedures, antibiotic start and stop dates in addition to other pertinent events   January 06, 2021 admission, intubation  Interim History / Subjective:   Remains critically ill. Sedation held. Follows commands, left hemiparesis. Right UE and LE movements on command.  Objective   Blood pressure (!) 141/81, pulse 98, temperature 100.1 F (37.8 C), temperature source Axillary, resp. rate 14, height '6\' 2"'$  (1.88 m), weight 70.7 kg, SpO2 98 %.    Vent Mode: PSV;CPAP FiO2 (%):  [40 %] 40 % Set Rate:  [12 bmp] 12 bmp Vt Set:  [490 mL] 490 mL PEEP:  [5 cmH20] 5 cmH20 Pressure Support:  [5 cmH20-8 cmH20] 8 cmH20 Plateau Pressure:  [13 cmH20] 13 cmH20   Intake/Output Summary (Last 24 hours) at 01/09/2021 1017 Last data filed at 01/09/2021 0622 Gross per 24 hour  Intake 1609.58 ml  Output 425 ml  Net 1184.58 ml    Filed Weights   01/04/2021 1315 01/14/2021 1448 01/09/2021 1647  Weight: 101 kg 100.7 kg 70.7 kg    Examination: General: critically ill intubated on life support  HEENT: NCAT, tracking, opens to stimulation and voice  Neuro: left hemiplegic, right UE and LE movements on commands  Chest: BL vented breaths  Heart: RRR, s1 s2  Abdomen: soft, nt nd  Skin: no rash   Resolved  Hospital Problem list     Assessment & Plan:   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 uncontrolled hypertension Cerebral edema Acute encephalopathy due to hemorrhagic stroke Induced hypernatremia P: Neuro checks  Appreciate neuro sx and neuro team recs  BP management  Cleviprex if needed  Restarted oral BP meds  Hypertonic, manage Na+ at goal 150-155, off HTS with Na remaining > 155 RASS -1, PAD guidelines   Acute respiratory failure with hypoxemia acute hemorrhagic pontine stroke Probable aspiration pneumonia P:  Remains on vent  LTVV, lung protections SAT SBT as tolerated, close to extubation Unasyn for aspiration  Follow up cx results   Acute kidney injury on Chronic kidney disease stage IV: Creatinine 5.2 from 3.8 10/9 to 10/10. Concern for volume depletion given no nutrition. Checking for urinary retention as foley removed 10/9.  Hypokalemia  P: IV hydration 10/10 Start TF 10/10 Bicarb dropped with AKI, hesitant to extubate given metabolic acidosis  Uncontrolled hypertension Continue oral BP meds, BP better controlled Monitor   Alcohol dependence Thiamine folate Watch for withdrawal   Probable hyperthyroidism TSH is low  Check t3 t4    Best Practice (right click and "Reselect all SmartList Selections" daily)   Diet/type: tubefeeds DVT prophylaxis: SCD GI prophylaxis: H2B Lines: N/A Foley:  Yes, and it is still needed Code Status:  full code Last date of multidisciplinary goals of care discussion [per primary]  Labs   CBC: Recent  Labs  Lab 01/18/2021 1308 01/07/2021 1321 01/21/2021 1700 01/07/21 0300 01/08/21 0000  WBC 10.4  --   --  8.8 10.6*  NEUTROABS 9.4*  --   --   --   --   HGB 10.9* 11.9* 9.5* 9.0* 8.6*  HCT 33.4* 35.0* 28.0* 29.4* 28.8*  MCV 94.4  --   --  96.7 100.3*  PLT 251  --   --  189 157     Basic Metabolic Panel: Recent Labs  Lab 01/23/2021 1308 01/12/2021 1321 01/26/2021 1411 01/19/2021 1506  01/05/2021 1700 01/11/2021 2109 01/07/21 0300 01/07/21 1037 01/08/21 0000 01/08/21 0816 01/08/21 1800 01/09/21 0139 01/09/21 0630  NA 141 142  --    < > 149*   < > 149*   < > 159* 161* 161* 159* 158*  K 3.8 3.3*  --   --  2.9*  --  2.8*  --  3.6  --   --  3.6  --   CL 105 108  --   --   --   --  118*  --  >130*  --   --  >130*  --   CO2 23  --   --   --   --   --  21*  --  17*  --   --  16*  --   GLUCOSE 136* 118*  --   --   --   --  113*  --  116*  --   --  80  --   BUN 34* 38*  --   --   --   --  27*  --  27*  --   --  35*  --   CREATININE 3.65* 3.50*  --   --   --   --  3.59*  --  3.80*  --   --  5.20*  --   CALCIUM 9.1  --   --   --   --   --  8.9  --  9.0  --   --  8.8*  --   MG  --   --  1.4*  --   --   --  2.2  --  2.2  --   --   --  2.1  PHOS  --   --   --   --   --   --  4.1  --   --   --   --   --  6.1*   < > = values in this interval not displayed.    GFR: Estimated Creatinine Clearance: 16.4 mL/min (A) (by C-G formula based on SCr of 5.2 mg/dL (H)). Recent Labs  Lab 01/23/2021 1308 01/16/2021 1506 01/07/21 0300 01/08/21 0000  WBC 10.4  --  8.8 10.6*  LATICACIDVEN 2.0* 1.7  --   --      Liver Function Tests: Recent Labs  Lab 01/08/2021 1308 01/07/21 0300  AST 31  --   ALT 15  --   ALKPHOS 71  --   BILITOT 0.8  --   PROT 7.6  --   ALBUMIN 3.3* 2.5*    Recent Labs  Lab 01/18/2021 1308  LIPASE 28    No results for input(s): AMMONIA in the last 168 hours.  ABG    Component Value Date/Time   PHART 7.409 01/05/2021 1700   PCO2ART 35.7 01/03/2021 1700   PO2ART 96 01/14/2021 1700   HCO3 22.6 01/05/2021 1700   TCO2 24 01/16/2021  1700   ACIDBASEDEF 2.0 01/02/2021 1700   O2SAT 98.0 01/23/2021 1700      Coagulation Profile: Recent Labs  Lab 01/05/2021 1308  INR 1.0     Cardiac Enzymes: Recent Labs  Lab 01/23/2021 1801  CKTOTAL 419*     HbA1C: Hgb A1c MFr Bld  Date/Time Value Ref Range Status  01/07/2021 10:37 AM 5.4 4.8 - 5.6 % Final     Comment:    (NOTE) Pre diabetes:          5.7%-6.4%  Diabetes:              >6.4%  Glycemic control for   <7.0% adults with diabetes     CBG: Recent Labs  Lab 01/08/21 1611 01/08/21 2004 01/08/21 2359 01/09/21 0328 01/09/21 0750  GLUCAP 79 75 70 73 73      CRITICAL CARE Performed by: Bonna Gains Jeorge Reister   Total critical care time: 35 minutes  Critical care time was exclusive of separately billable procedures and treating other patients.  Critical care was necessary to treat or prevent imminent or life-threatening deterioration.  Critical care was time spent personally by me on the following activities: development of treatment plan with patient and/or surrogate as well as nursing, discussions with consultants, evaluation of patient's response to treatment, examination of patient, obtaining history from patient or surrogate, ordering and performing treatments and interventions, ordering and review of laboratory studies, ordering and review of radiographic studies, pulse oximetry and re-evaluation of patient's condition.   Lanier Clam, MD [facility] 01/09/2021 10:17 AM

## 2021-01-09 NOTE — Progress Notes (Signed)
Initial Nutrition Assessment  DOCUMENTATION CODES:   Non-severe (moderate) malnutrition in context of chronic illness  INTERVENTION:   Initiate tube feeding via OG tube: Vital 1.5 at 30 ml/h increase by 10 ml every 8 hours to goal rate of 60 ml/hr (1440 ml per day) Prosource TF 45 ml BID  Provides 2240 kcal, 119 gm protein, 1167 ml free water daily  200 ml free water every 2 hours (per neurology for Na level) Total free water: 3567 ml   NUTRITION DIAGNOSIS:   Moderate Malnutrition related to chronic illness as evidenced by moderate fat depletion, moderate muscle depletion, severe muscle depletion.  GOAL:   Patient will meet greater than or equal to 90% of their needs  MONITOR:   TF tolerance  REASON FOR ASSESSMENT:   Consult Enteral/tube feeding initiation and management  ASSESSMENT:   Pt with PMH of uncontrolled HTN, CKD stage IV, CHF, HLD, daily ETOH, medication non-adherence, iron deficiency anemia admitted after being found down at home by poilce during a well check. Admitted with a dx of pontine hemorrhage.   Per CCM pt following commands with L hemiparesis. Off sedation Pt opens eyes while in room, noted pt had a lot of oral secretions.  Per chart review in 2017 pt was 101 kg, admission weights most likely estimates based on past weight hx. Last weight 10/7 was 70.7 kg No family present during visit, therefore unable to obtain nutrition/weight hx. Per chart pt drinks 1/2 bottle of vodka per day.    Patient is currently intubated on ventilator support MV: 8.8 L/min Temp (24hrs), Avg:99 F (37.2 C), Min:98.2 F (36.8 C), Max:100.1 F (37.8 C)   Medications reviewed and include: folic acid, SSI, MVI with minerals, protonix, miralax, sneokot-s, thiamine Labs reviewed:  CBG's: 73-89   OG tube in place, per xray tip in distal stomach   NUTRITION - FOCUSED PHYSICAL EXAM:  Flowsheet Row Most Recent Value  Orbital Region Mild depletion  Upper Arm Region  Moderate depletion  Thoracic and Lumbar Region Moderate depletion  Buccal Region Unable to assess  Temple Region Moderate depletion  Clavicle Bone Region Moderate depletion  Clavicle and Acromion Bone Region Moderate depletion  Scapular Bone Region Unable to assess  Dorsal Hand Mild depletion  Patellar Region Severe depletion  Anterior Thigh Region Severe depletion  Posterior Calf Region Severe depletion  Edema (RD Assessment) None  Hair Reviewed  Eyes Reviewed  Mouth Unable to assess  Skin Reviewed  Nails Reviewed  [pale nail beds]       Diet Order:   Diet Order             Diet NPO time specified  Diet effective now                   EDUCATION NEEDS:   Not appropriate for education at this time  Skin:  Skin Assessment: Reviewed RN Assessment  Last BM:  10/9 large  Height:   Ht Readings from Last 1 Encounters:  01/26/2021 '6\' 2"'$  (1.88 m)    Weight:   Wt Readings from Last 1 Encounters:  01/13/2021 70.7 kg    BMI:  Body mass index is 20.01 kg/m.  Estimated Nutritional Needs:   Kcal:  2000-2200  Protein:  105-120 grams  Fluid:  > 2 L/day  Lockie Pares., RD, LDN, CNSC See AMiON for contact information

## 2021-01-09 NOTE — Progress Notes (Signed)
STROKE TEAM PROGRESS NOTE   INTERVAL HISTORY Admitted after being found down in his home by police who were conducting welfare check requested by family. Intubated for airway protection. Had complained of GI discomfort for several days prior to admission. CT abd no acute concerns.   Today he remains intubated on precedex sedation.  Tmax 100.1, BP adequately controlled. No further episodes of bradycardia  or hypothermia. since yesterday morningHe is alert, following commands. Creatinine 3.8->5.2 on IVF and free water. HTS has been stopped. He is on Rocephin for pneumonia. We explained his plan of care. He was attentive to examiners.    Vitals:   01/09/21 0600 01/09/21 0700 01/09/21 0800 01/09/21 0812  BP: (!) 143/86 135/84 136/84 136/84  Pulse: 100 (!) 101 100   Resp: '16 18 17   '$ Temp:   100.1 F (37.8 C)   TempSrc:   Axillary   SpO2: 99% 99% 99% 99%  Weight:      Height:       CBC:  Recent Labs  Lab 01/12/2021 1308 01/27/2021 1321 01/07/21 0300 01/08/21 0000  WBC 10.4  --  8.8 10.6*  NEUTROABS 9.4*  --   --   --   HGB 10.9*   < > 9.0* 8.6*  HCT 33.4*   < > 29.4* 28.8*  MCV 94.4  --  96.7 100.3*  PLT 251  --  189 157   < > = values in this interval not displayed.   Basic Metabolic Panel:  Recent Labs  Lab 01/07/21 0300 01/07/21 1037 01/08/21 0000 01/08/21 0816 01/09/21 0139 01/09/21 0630  NA 149*   < > 159*   < > 159* 158*  K 2.8*  --  3.6  --  3.6  --   CL 118*  --  >130*  --  >130*  --   CO2 21*  --  17*  --  16*  --   GLUCOSE 113*  --  116*  --  80  --   BUN 27*  --  27*  --  35*  --   CREATININE 3.59*  --  3.80*  --  5.20*  --   CALCIUM 8.9  --  9.0  --  8.8*  --   MG 2.2  --  2.2  --   --  2.1  PHOS 4.1  --   --   --   --  6.1*   < > = values in this interval not displayed.   Lipid Panel:  Recent Labs  Lab 01/08/21 0000 01/08/21 0817  CHOL 184  --   TRIG 71 76  HDL 95  --   CHOLHDL 1.9  --   VLDL 14  --   LDLCALC 75  --    HgbA1c:  Recent Labs   Lab 01/07/21 1037  HGBA1C 5.4   Urine Drug Screen:  Recent Labs  Lab 01/21/2021 1310  LABOPIA NONE DETECTED  COCAINSCRNUR NONE DETECTED  LABBENZ NONE DETECTED  AMPHETMU NONE DETECTED  THCU NONE DETECTED  LABBARB NONE DETECTED    Alcohol Level  Recent Labs  Lab 01/22/2021 1411  ETH <10    IMAGING past 24 hours DG Abd Portable 1V  Result Date: 01/09/2021 CLINICAL DATA:  NG tube placement EXAM: PORTABLE ABDOMEN - 1 VIEW COMPARISON:  KUB 01/13/2021 FINDINGS: The enteric catheter tip projects over the expected location of the pylorus/first portion of the duodenum. The side hole projects over the distal stomach. There is a nonobstructive bowel  gas pattern to the level imaged. The lung bases are clear. IMPRESSION: Enteric catheter in appropriate position. Electronically Signed   By: Valetta Mole M.D.   On: 01/09/2021 08:49    PHYSICAL EXAM Physical Exam  Constitutional: Appears thin, chronically ill Eyes: No scleral injection. Proptotic, which is new per family, right worse than left.  Right eye also has some exudate/clouding and is chemotic HENT: No oropharyngeal obstruction.  MSK: no joint deformities.  Cardiovascular: Tachycardia 100s, SR on cardiac monitor  Respiratory: Intubated, not labored,symmetric rise  Skin: Warm dry and intact visible skin   Neuro: Mental Status: Patient is awake, intubated, off sedation. Following simple but not complex commands. Eyes open.  Cranial Nerves: II: Pupils are equal, round, and reactive to light.  2 mm -> 1 mm reactive, III,IV, VI: EOM w/ occular bobbing, mildly dysconjugate gaze V: Facial sensation corneals bilaterally intact VII: Facial movement is notable for upper face more reactive on the left than the right, lower face limited eval due to ETT VIII: hearing is intact to voice X/XI: Gag present  Motor: Antigravity on LUE, RUE weakly following commands with the hand, Antigravity LLE  and 2/3 RLE  Sensory: Less reactive on the left  than the right Cerebellar: UTA     ASSESSMENT/PLAN Timothy Good is a 55 y.o. male with a past medical history significant for uncontrolled hypertension complicated by CKD stage IV, heart failure, hyperlipidemia, daily drinking, medication nonadherence, iron deficiency anemia/anemia of chronic disease, presenting with a pontine hemorrhage. ICH Score: 3. NIHSS 24. Family reported he had some difficulty swallowing for a few days and had been complaining of GI discomfort.   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   Code Stroke  1. Examination is positive for hemorrhagic infarct within the brainstem. 2. Posterior extension of clot into the fourth ventricle and the cisterna magna. 3. Chronic small vessel ischemic disease and brain atrophy. MRI   1. Acute brainstem hemorrhage centered at the pons with estimated intra-axial blood volume of 10 mL. Extensive pontine edema, which tracks associated edema which tracks into the midbrain right > left and into the right medullary pyramid. 2. Mild extension of hemorrhage into the 4th ventricle and basilar subarachnoid spaces. But no ventriculomegaly. No midline shift or impending herniation. 3. Underlying advanced chronic small vessel disease including multiple chronic microhemorrhages in the cerebellum and bilateral deep gray matter nuclei. Advanced bilateral cerebral white matter ischemia. 4. Negative noncontrast MRI appearance of the orbits. MR Orbits Negative noncontrast MRI appearance of the orbits.  2D Echo  1. Left ventricular ejection fraction, by estimation, is 55 to 60%. The  left ventricle has normal function. The left ventricle demonstrates  regional wall motion abnormalities. Basal inferior akinesis.There is moderate asymmetric left ventricular  hypertrophy of the basal-septal segment. Left ventricular diastolic parameters are consistent with Grade I diastolic  dysfunction (impaired relaxation).   2. Right ventricular systolic function is normal. The right ventricular  size is normal. Tricuspid regurgitation signal is inadequate for assessing PA pressure.   3. The mitral valve is normal in structure. No evidence of mitral valve regurgitation.   4. The aortic valve was not well visualized. Aortic valve regurgitation is mild. No aortic stenosis is present.   5. The inferior vena cava is normal in size with greater than 50%  Resp **The interatrial septum was not well visualized.   CT Chest Abd Pelvis 1. Suggestion of generalized gastric wall thickening, poorly assessed on this  scan due to gastric decompression by enteric tube, with associated fat stranding in the gastrohepatic ligament. Findings are nonspecific and could be due to noninflammatory edema,gastritis, peptic ulcer disease or neoplasm. Consider upper endoscopic correlation as clinically warranted. 2. Small volume ascites, predominantly perihepatic. 3. Prominently distended gallbladder. No radiopaque cholelithiasis. No gallbladder wall thickening. No pericholecystic fluid. No biliary ductal dilatation. 4. Moderate diffuse colonic diverticulosis. 5. Mild to moderate dependent bibasilar atelectasis. 6. Coronary atherosclerosis. 7. Chronic appearing mild T10 vertebral compression fracture. 8. Aortic Atherosclerosis (ICD10-I70.0).  LDL 75 HgbA1c 5.4 VTE prophylaxis - SCDs, lovenox     Diet   Diet NPO time specified  Not on Anticoagulant or antiplatelet prior to admission Not taking Rx medications prior to admission Hold anticoagulant/antiplatelet medications in the setting of hemorrhage Therapy recommendations:  pending patient status  Disposition:  TBD       Cerebral Edema Neurosurgery consulted, no acute intervention recommended at this time. Hypertonic saline infusion has been discontinued, allow sodium to trend down with daily check   Na 149->147->153->158->159->161->159        Acute respiratory failure      Probable aspiration pneumonia Remains intubated, appreciate CCM management First on Unasyn, now on Rocephin Resp culture: few gram positive cocci Precedex off     Hypertension Home meds: not taking any prescribed meds >1 year  Unstable Keep systolic BP less than [id number]  Long-term BP goal normotensive  Hyperlipidemia Home meds:  None LDL  75, almost at goal < 70 High intensity statin to be addressed at discharge in setting of pontine hemorrhage Continue statin at discharge       Glycemic management No hx of DM2  HgbA1c  5.4, at goal < 7.0 CBGs Recent Labs    01/08/21 2359 01/09/21 0328 01/09/21 0750  GLUCAP 70 73 73   SSI       AKI on CKD IV Creatinine 3.65->3.80->-5.2 LR x 8 hours  and free water  ?urinary retention vs. oliguria, ?need to replace foley CCM on board         Hypokalemia Monitor and replete as needed 3.9->2.8->3.6-       ETOH use disorder with dependence Daily use of excessive liquor per family (?1/2 bottle vodka)  Thiamine, folate and MVI on board Monitor magnesium, 2.2->2.2->2.1 Vitamin B12 -213 Monitor s/s of withdrawal, need for CIWA        Feeding      Dysphagia Continue tube feeding and free water        Abd discomfort on presentation CT, Chest Abd Pelvis: generalized gastric wall thickening, with associated fat stranding in the gastrohepatic ligament.Findings are nonspecific and could be due to noninflammatory edema,gastritis, peptic ulcer disease or neoplasm.  CCM, Dr. Valeta Harms: nothing acutely concerning   Other Stroke Risk Factors Current Cigarette smoker, will be advised to stop smoking ETOH use disorder alcohol level <10, advised to drink no more than 2 drink(s) a day  Other Active Problems ? Hyperthyroidism, outpatient follow up, T3 pending and T4 wnl Bradycardia and hypothermia 10/9 resolved ?precedex Hospital day # 3 This patient was seen and evaluated with Dr. Leonie Man. He directed the plan of care.   Charlene Brooke, NP-C   STROKE MD NOTE : Patient remains intubated for a respiratory failure given his large pontine stroke is unlikely to be able to protect his airway and may need to consider early tracheostomy and PEG tube.  Continue strict control of hypertension with systolic blood pressure goal below 160.  Close neurological monitoring.  Discussed with Dr. Silas Flood critical care medicine.  No family available at the bedside for discussion.This patient is critically ill and at significant risk of neurological worsening, death and care requires constant monitoring of vital signs, hemodynamics,respiratory and cardiac monitoring, extensive review of multiple databases, frequent neurological assessment, discussion with family, other specialists and medical decision making of high complexity.I have made any additions or clarifications directly to the above note.This critical care time does not reflect procedure time, or teaching time or supervisory time of PA/NP/Med Resident etc but could involve care discussion time.  I spent 30 minutes of neurocritical care time  in the care of  this patient. Antony Contras MD    To contact Stroke Continuity provider, please refer to [email or web address] After hours, contact General Neurology

## 2021-01-10 DIAGNOSIS — E440 Moderate protein-calorie malnutrition: Secondary | ICD-10-CM | POA: Insufficient documentation

## 2021-01-10 DIAGNOSIS — I613 Nontraumatic intracerebral hemorrhage in brain stem: Secondary | ICD-10-CM | POA: Diagnosis not present

## 2021-01-10 LAB — POCT I-STAT 7, (LYTES, BLD GAS, ICA,H+H)
Acid-base deficit: 8 mmol/L — ABNORMAL HIGH (ref 0.0–2.0)
Bicarbonate: 17 mmol/L — ABNORMAL LOW (ref 20.0–28.0)
Calcium, Ion: 1.35 mmol/L (ref 1.15–1.40)
HCT: 24 % — ABNORMAL LOW (ref 39.0–52.0)
Hemoglobin: 8.2 g/dL — ABNORMAL LOW (ref 13.0–17.0)
O2 Saturation: 99 %
Patient temperature: 98.1
Potassium: 3.2 mmol/L — ABNORMAL LOW (ref 3.5–5.1)
Sodium: 152 mmol/L — ABNORMAL HIGH (ref 135–145)
TCO2: 18 mmol/L — ABNORMAL LOW (ref 22–32)
pCO2 arterial: 32.6 mmHg (ref 32.0–48.0)
pH, Arterial: 7.323 — ABNORMAL LOW (ref 7.350–7.450)
pO2, Arterial: 162 mmHg — ABNORMAL HIGH (ref 83.0–108.0)

## 2021-01-10 LAB — URINALYSIS, ROUTINE W REFLEX MICROSCOPIC
Bilirubin Urine: NEGATIVE
Glucose, UA: NEGATIVE mg/dL
Ketones, ur: NEGATIVE mg/dL
Nitrite: NEGATIVE
Protein, ur: 100 mg/dL — AB
Specific Gravity, Urine: 1.013 (ref 1.005–1.030)
pH: 5 (ref 5.0–8.0)

## 2021-01-10 LAB — BASIC METABOLIC PANEL
Anion gap: 10 (ref 5–15)
BUN: 47 mg/dL — ABNORMAL HIGH (ref 6–20)
CO2: 16 mmol/L — ABNORMAL LOW (ref 22–32)
Calcium: 9 mg/dL (ref 8.9–10.3)
Chloride: 125 mmol/L — ABNORMAL HIGH (ref 98–111)
Creatinine, Ser: 6.58 mg/dL — ABNORMAL HIGH (ref 0.61–1.24)
GFR, Estimated: 9 mL/min — ABNORMAL LOW (ref >60)
Glucose, Bld: 106 mg/dL — ABNORMAL HIGH (ref 70–99)
Potassium: 3.4 mmol/L — ABNORMAL LOW (ref 3.5–5.1)
Sodium: 151 mmol/L — ABNORMAL HIGH (ref 135–145)

## 2021-01-10 LAB — GLUCOSE, CAPILLARY
Glucose-Capillary: 105 mg/dL — ABNORMAL HIGH (ref 70–99)
Glucose-Capillary: 107 mg/dL — ABNORMAL HIGH (ref 70–99)
Glucose-Capillary: 124 mg/dL — ABNORMAL HIGH (ref 70–99)
Glucose-Capillary: 91 mg/dL (ref 70–99)
Glucose-Capillary: 99 mg/dL (ref 70–99)

## 2021-01-10 LAB — PHOSPHORUS
Phosphorus: 5.4 mg/dL — ABNORMAL HIGH (ref 2.5–4.6)
Phosphorus: 4.3 mg/dL (ref 2.5–4.6)

## 2021-01-10 LAB — CREATININE, URINE, RANDOM: Creatinine, Urine: 135.14 mg/dL

## 2021-01-10 LAB — MAGNESIUM
Magnesium: 2.2 mg/dL (ref 1.7–2.4)
Magnesium: 1.8 mg/dL (ref 1.7–2.4)

## 2021-01-10 LAB — METHYLMALONIC ACID, SERUM: Methylmalonic Acid, Quantitative: 265 nmol/L (ref 0–378)

## 2021-01-10 LAB — SODIUM, URINE, RANDOM: Sodium, Ur: 56 mmol/L

## 2021-01-10 MED ORDER — ALBUMIN HUMAN 25 % IV SOLN
12.5000 g | Freq: Once | INTRAVENOUS | Status: AC
  Administered 2021-01-10: 12.5 g via INTRAVENOUS
  Filled 2021-01-10: qty 50

## 2021-01-10 MED ORDER — LABETALOL HCL 5 MG/ML IV SOLN: 10.0000 mg | INTRAVENOUS | Status: DC | PRN

## 2021-01-10 MED ORDER — FUROSEMIDE 10 MG/ML IJ SOLN
120.0000 mg | Freq: Once | INTRAVENOUS | Status: AC
  Administered 2021-01-10: 120 mg via INTRAVENOUS
  Filled 2021-01-10: qty 10

## 2021-01-10 MED ORDER — ARTIFICIAL TEARS OPHTHALMIC OINT
TOPICAL_OINTMENT | OPHTHALMIC | Status: DC | PRN
  Administered 2021-01-10 – 2021-01-14 (×6): 1 via OPHTHALMIC
  Filled 2021-01-10: qty 3.5

## 2021-01-10 MED ORDER — POTASSIUM CHLORIDE 10 MEQ/100ML IV SOLN
10.0000 meq | INTRAVENOUS | Status: AC
  Administered 2021-01-10 (×4): 10 meq via INTRAVENOUS
  Filled 2021-01-10 (×4): qty 100

## 2021-01-10 MED ORDER — FUROSEMIDE 10 MG/ML IJ SOLN
80.0000 mg | Freq: Once | INTRAMUSCULAR | Status: AC
  Administered 2021-01-10: 80 mg via INTRAVENOUS
  Filled 2021-01-10: qty 8

## 2021-01-10 NOTE — Progress Notes (Addendum)
STROKE TEAM PROGRESS NOTE   INTERVAL HISTORY Today he remains intubated for respiratory failure on precedex sedation.  Tmax 100.1, BP adequately controlled of any drips .no further episodes of bradycardia  or hypothermia. since yesterday morningHe is alert, following commands. Creatinine 3.8->5.2 on IVF and free water. HTS has been stopped. He is on Rocephin for pneumonia. We explained his plan of care. He was attentive to examiners.  His neurological exam is unchanged.  His daughter is at the bedside at time of this exam. We had a long discussion with family today regarding plans of care, including possible trach placement after stabilization of advanced acute kidney disease.    Vitals:   01/10/21 0700 01/10/21 0751 01/10/21 0800 01/10/21 0834  BP: (!) 133/95 (!) 133/95 (!) 140/96 (!) 140/96  Pulse: 84  86 92  Resp: 19  17   Temp:   (!) 97.4 F (36.3 C)   TempSrc:   Axillary   SpO2: 100% 98% 98%   Weight:      Height:       CBC:  Recent Labs  Lab 01/05/2021 1308 01/05/2021 1321 01/07/21 0300 01/08/21 0000 01/10/21 0323  WBC 10.4  --  8.8 10.6*  --   NEUTROABS 9.4*  --   --   --   --   HGB 10.9*   < > 9.0* 8.6* 8.2*  HCT 33.4*   < > 29.4* 28.8* 24.0*  MCV 94.4  --  96.7 100.3*  --   PLT 251  --  189 157  --    < > = values in this interval not displayed.    Basic Metabolic Panel:  Recent Labs  Lab 01/09/21 0139 01/09/21 0630 01/09/21 1641 01/10/21 0024 01/10/21 0323  NA 159*   < >  --  151* 152*  K 3.6  --   --  3.4* 3.2*  CL >130*  --   --  125*  --   CO2 16*  --   --  16*  --   GLUCOSE 80  --   --  106*  --   BUN 35*  --   --  47*  --   CREATININE 5.20*  --   --  6.58*  --   CALCIUM 8.8*  --   --  9.0  --   MG  --    < > 2.3 2.2  --   PHOS  --    < > 3.6 5.4*  --    < > = values in this interval not displayed.    Lipid Panel:  Recent Labs  Lab 01/08/21 0000 01/08/21 0817  CHOL 184  --   TRIG 71 76  HDL 95  --   CHOLHDL 1.9  --   VLDL 14  --    LDLCALC 75  --     HgbA1c:  Recent Labs  Lab 01/07/21 1037  HGBA1C 5.4    Urine Drug Screen:  Recent Labs  Lab 01/27/2021 1310  LABOPIA NONE DETECTED  COCAINSCRNUR NONE DETECTED  LABBENZ NONE DETECTED  AMPHETMU NONE DETECTED  THCU NONE DETECTED  LABBARB NONE DETECTED     Alcohol Level  Recent Labs  Lab 01/04/2021 1411  ETH <10     IMAGING past 24 hours No results found.  PHYSICAL EXAM Physical Exam  Constitutional: Appears thin, chronically ill who is intubated and sedated Eyes: No scleral injection. Proptotic, which is new per family, right worse than left.  Right eye also  has some exudate/clouding and is chemotic HENT: No oropharyngeal obstruction.  MSK: no joint deformities.  Cardiovascular: Tachycardia 100s, SR on cardiac monitor  Respiratory: Intubated, not labored,symmetric rise  Skin: Warm dry and intact visible skin   Neuro: Mental Status: Patient is  Awake, intubated, off sedation. Following simple but not complex commands. Eyes open.  Cranial Nerves: II: Pupils are equal, round, and reactive to light.  2 mm -> 1 mm reactive, III,IV, VI: EOM w/ occular bobbing, mildly dysconjugate gaze V: Facial sensation corneals bilaterally intact VII: Facial movement is notable for upper face more reactive on the left than the right, lower face limited eval due to ETT VIII: hearing is intact to voice X/XI: Gag present  Motor: Antigravity on LUE, RUE weakly following commands with the hand, Antigravity LLE  and 2/5 RLE  Sensory: Less reactive on the left than the right Cerebellar: UTA     ASSESSMENT/PLAN Timothy Good is a 55 y.o. male with a past medical history significant for uncontrolled hypertension complicated by CKD stage IV, heart failure, hyperlipidemia, daily drinking, medication nonadherence, iron deficiency anemia/anemia of chronic disease, presenting with a pontine hemorrhage. ICH Score: 3. NIHSS 24. Family reported he had some difficulty  swallowing for a few days and had been complaining of GI discomfort.   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   Code Stroke  1. Examination is positive for hemorrhagic infarct within the brainstem. 2. Posterior extension of clot into the fourth ventricle and the cisterna magna. 3. Chronic small vessel ischemic disease and brain atrophy. MRI   1. Acute brainstem hemorrhage centered at the pons with estimated intra-axial blood volume of 10 mL. Extensive pontine edema, which tracks associated edema which tracks into the midbrain right > left and into the right medullary pyramid. 2. Mild extension of hemorrhage into the 4th ventricle and basilar subarachnoid spaces. But no ventriculomegaly. No midline shift or impending herniation. 3. Underlying advanced chronic small vessel disease including multiple chronic microhemorrhages in the cerebellum and bilateral deep gray matter nuclei. Advanced bilateral cerebral white matter ischemia. 4. Negative noncontrast MRI appearance of the orbits. MR Orbits Negative noncontrast MRI appearance of the orbits.  2D Echo  1. Left ventricular ejection fraction, by estimation, is 55 to 60%. The  left ventricle has normal function. The left ventricle demonstrates  regional wall motion abnormalities. Basal inferior akinesis.There is moderate asymmetric left ventricular  hypertrophy of the basal-septal segment. Left ventricular diastolic parameters are consistent with Grade I diastolic dysfunction (impaired relaxation).   2. Right ventricular systolic function is normal. The right ventricular  size is normal. Tricuspid regurgitation signal is inadequate for assessing PA pressure.   3. The mitral valve is normal in structure. No evidence of mitral valve regurgitation.   4. The aortic valve was not well visualized. Aortic valve regurgitation is mild. No aortic stenosis is present.   5. The  inferior vena cava is normal in size with greater than 50%  Resp **The interatrial septum was not well visualized.   CT Chest Abd Pelvis 1. Suggestion of generalized gastric wall thickening, poorly assessed on this scan due to gastric decompression by enteric tube, with associated fat stranding in the gastrohepatic ligament. Findings are nonspecific and could be due to noninflammatory edema,gastritis, peptic ulcer disease or neoplasm. Consider upper endoscopic correlation as clinically warranted. 2. Small volume ascites, predominantly perihepatic. 3. Prominently distended gallbladder. No radiopaque cholelithiasis. No gallbladder wall thickening. No pericholecystic fluid. No biliary ductal  dilatation. 4. Moderate diffuse colonic diverticulosis. 5. Mild to moderate dependent bibasilar atelectasis. 6. Coronary atherosclerosis. 7. Chronic appearing mild T10 vertebral compression fracture. 8. Aortic Atherosclerosis (ICD10-I70.0).  LDL 75 HgbA1c 5.4 VTE prophylaxis - SCDs, lovenox     Diet   Diet NPO time specified   Not on Anticoagulant or antiplatelet prior to admission Not taking Rx medications prior to admission Hold anticoagulant/antiplatelet medications in the setting of hemorrhage Therapy recommendations:  pending patient status  Disposition:  TBD       Cerebral Edema Neurosurgery consulted, no acute intervention recommended at this time. Hypertonic saline infusion has been discontinued, allow sodium to trend down with daily check   Na 149->147->153->158->159->161->159       Acute respiratory failure      Probable aspiration pneumonia Remains intubated, appreciate CCM management First on Unasyn, now on Rocephin Resp culture: few gram positive cocci Precedex off     Hypertension Home meds: not taking any prescribed meds >1 year  Unstable Keep systolic BP less than [id number]  Long-term BP goal normotensive  Hyperlipidemia Home meds:  None LDL  75, almost at goal <  70 High intensity statin to be addressed at discharge in setting of pontine hemorrhage Continue statin at discharge       Glycemic management No hx of DM2  HgbA1c  5.4, at goal < 7.0 CBGs Recent Labs    01/09/21 2013 01/09/21 2336 01/10/21 0343  GLUCAP 93 99 91    SSI       AKI on CKD IV Creatinine 3.65->3.80->-5.2 LR x 8 hours  and free water  ?urinary retention vs. oliguria, ?need to replace foley CCM on board         Hypokalemia Monitor and replete as needed 3.9->2.8->3.6-       ETOH use disorder with dependence Daily use of excessive liquor per family (?1/2 bottle vodka)  Thiamine, folate and MVI on board Monitor magnesium, 2.2->2.2->2.1 Vitamin B12 -213 Monitor s/s of withdrawal, need for CIWA        Feeding      Dysphagia Continue tube feeding and free water        Abd discomfort on presentation CT, Chest Abd Pelvis: generalized gastric wall thickening, with associated fat stranding in the gastrohepatic ligament.Findings are nonspecific and could be due to noninflammatory edema,gastritis, peptic ulcer disease or neoplasm.  CCM, Dr. Valeta Harms: nothing acutely concerning   Other Stroke Risk Factors Current Cigarette smoker, will be advised to stop smoking ETOH use disorder alcohol level <10, advised to drink no more than 2 drink(s) a day  Other Active Problems ? 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 Agree with note above.  Patient remains intubated for a  respiratory failure given his large pontine stroke is unlikely to be able to protect his airway and may need to consider early tracheostomy and PEG tube.  Continue strict control of hypertension with systolic blood pressure goal below 160.  Close neurological monitoring.  Discussed with Dr. Silas Flood critical care medicine.  Agree with nephrology consult for worsening renal failure.  Discussed with patient  and daughter at the bedside and answered questions.  This patient is critically ill and at significant risk of neurological worsening, death and care requires constant monitoring of vital signs, hemodynamics,respiratory and cardiac monitoring, extensive review of multiple databases, frequent neurological assessment, discussion with family, other specialists and medical decision making of high complexity.I have made any additions or clarifications directly to the above note.This critical care time does not reflect procedure time, or teaching time or supervisory time of PA/NP/Med Resident etc but could involve care discussion time.  I spent 35 minutes of neurocritical care time  in the care of  this patient. Antony Contras MD    To contact Stroke Continuity provider, please refer to [email or web address] After hours, contact General Neurology

## 2021-01-10 NOTE — Consult Note (Signed)
Reason for Consult: Acute kidney injury on chronic kidney disease stage III-IV Referring Physician: Matthew Hunsucker, MD (CCM)  HPI: (History obtained from chart review and his oldest daughter, Shanika) 55-year-old man with past medical history significant for chronic combined systolic and diastolic congestive heart failure, hypertension that has been poorly controlled with nonadherence and underlying chronic kidney disease stage III-IV for which he is followed by Dr. Bhutani in [place].  His last available creatinine levels from 2021 indicate a variable baseline of 1.5-1.9 with negative work-up for GN.  He has underlying history of heavy alcohol use and appears to have not followed up with his primary care provider for the past year or so after changes in his insurance.  He was brought into the emergency room following a well call visit by the police and discovered to have acute brainstem hemorrhage centered at the pons with extensive pontine edema and mild extension of the hemorrhage into the fourth ventricle/basilar subarachnoid spaces with background advance chronic small vessel disease.  He has subsequent dense left hemiparesis and was intubated for airway protection.  Concern is raised with his worsening renal function/urine output with an admission creatinine that was significantly higher than his previous baseline 1 year ago.  He was reportedly taking NSAIDs prior to admission and does not appear to have suffered significant hemodynamic fluctuations while here or exposure to iodinated intravenous contrast.  Earlier CT scan of the abdomen/pelvis did not show any hydronephrosis and he has had several bladder scans/in and out catheterizations.   01/04/2021 13:21 01/07/2021 03:00 01/08/2021 00:00 01/09/2021 01:39 01/10/2021 00:24  BUN 38 (H) 27 (H) 27 (H) 35 (H) 47 (H)  Creatinine 3.50 (H) 3.59 (H) 3.80 (H) 5.20 (H) 6.58 (H)    Past Medical History:  Diagnosis Date   Chronic combined systolic and  diastolic CHF (congestive heart failure) (HCC)    Echo 06/2010 EF 20-25%, akinesis of the inferior myocardium, severe hypokinesis of the anteroseptal myocardial and grade 3 diastolic dysfunction; Echo 01/2012 mod dilated LV, mod LVH, EF 45-50%, grade 2 diastolic dysfunction, mod LAE, mild RAE    CKD (chronic kidney disease)    stage IV    H/O cardiovascular stress test    Lexiscan Myoview 06/2010 was negative for inducible ischemia with an EF of 14%.   H/O medication noncompliance    due to inability to afford medications   Hypertension    Uncontrolled   Iron deficiency anemia    Protein in urine    3.9 gm Mar 2012; 1.6gm 01/2012    Past Surgical History:  Procedure Laterality Date   none      Family History  Problem Relation Age of Onset   Heart failure Mother 53       "died of a weak heart from smoking"   Other Other        No known heart disease    Social History:  reports that he has never smoked. He has never used smokeless tobacco. He reports that he does not drink alcohol and does not use drugs.  Allergies:  Allergies  Allergen Reactions   Penicillins Other (See Comments)    Lost mobility in legs for a week.     Medications: I have reviewed the patient's current medications. Scheduled:  amLODipine  10 mg Per Tube Daily   carvedilol  12.5 mg Per Tube BID WC   chlorhexidine gluconate (MEDLINE KIT)  15 mL Mouth Rinse BID   Chlorhexidine Gluconate Cloth  6 each Topical Daily     feeding supplement (PROSource TF)  45 mL Per Tube BID   folic acid  1 mg Per Tube Daily   free water  200 mL Per Tube Q2H   hydrALAZINE  100 mg Per Tube Q8H   insulin aspart  0-6 Units Subcutaneous Q4H   mouth rinse  15 mL Mouth Rinse 10 times per day   multivitamin with minerals  1 tablet Per Tube Daily   pantoprazole sodium  40 mg Per Tube Daily   thiamine  100 mg Per Tube Daily    BMP Latest Ref Rng & Units 01/10/2021 01/10/2021 01/09/2021  Glucose 70 - 99 mg/dL - 106(H) -  BUN 6 - 20  mg/dL - 47(H) -  Creatinine 0.61 - 1.24 mg/dL - 6.58(H) -  Sodium 135 - 145 mmol/L 152(H) 151(H) 158(H)  Potassium 3.5 - 5.1 mmol/L 3.2(L) 3.4(L) -  Chloride 98 - 111 mmol/L - 125(H) -  CO2 22 - 32 mmol/L - 16(L) -  Calcium 8.9 - 10.3 mg/dL - 9.0 -   CBC Latest Ref Rng & Units 01/10/2021 01/08/2021 01/07/2021  WBC 4.0 - 10.5 K/uL - 10.6(H) 8.8  Hemoglobin 13.0 - 17.0 g/dL 8.2(L) 8.6(L) 9.0(L)  Hematocrit 39.0 - 52.0 % 24.0(L) 28.8(L) 29.4(L)  Platelets 150 - 400 K/uL - 157 189     DG Abd Portable 1V  Result Date: 01/09/2021 CLINICAL DATA:  NG tube placement EXAM: PORTABLE ABDOMEN - 1 VIEW COMPARISON:  KUB 01/12/2021 FINDINGS: The enteric catheter tip projects over the expected location of the pylorus/first portion of the duodenum. The side hole projects over the distal stomach. There is a nonobstructive bowel gas pattern to the level imaged. The lung bases are clear. IMPRESSION: Enteric catheter in appropriate position. Electronically Signed   By: Peter  Noone M.D.   On: 01/09/2021 08:49    Review of Systems  Unable to perform ROS: Intubated  Blood pressure 120/78, pulse 86, temperature (!) 97.4 F (36.3 C), temperature source Axillary, resp. rate (!) 21, height 6' 2" (1.88 m), weight 76.1 kg, SpO2 97 %. Physical Exam Vitals and nursing note (intubated) reviewed.  Constitutional:      Appearance: He is normal weight.  HENT:     Head: Atraumatic.     Comments: Intubated    Nose: Nose normal.     Mouth/Throat:     Mouth: Mucous membranes are dry.  Eyes:     General: No scleral icterus.    Conjunctiva/sclera: Conjunctivae normal.  Neck:     Comments: Intubated Cardiovascular:     Rate and Rhythm: Normal rate and regular rhythm.     Pulses: Normal pulses.     Heart sounds: Normal heart sounds.  Pulmonary:     Effort: Pulmonary effort is normal.     Breath sounds: Normal breath sounds. No wheezing or rales.  Abdominal:     General: Abdomen is flat.     Palpations: Abdomen is  soft.     Tenderness: There is no abdominal tenderness.  Musculoskeletal:     Cervical back: Neck supple.     Right lower leg: No edema.     Left lower leg: No edema.     Comments: 1-2+ upper extremity edema bilaterally  Skin:    General: Skin is warm and dry.  Neurological:     Comments: Left hemiplegia    Assessment/Plan: 1.  Acute kidney injury on chronic kidney disease stage III-IV: His underlying chronic kidney disease appears to be from hypertensive nephrosclerosis based on   previous work-up done by Dr. Bhutani and his acute kidney injury is rather unclear but possibly from ischemic ATN.  He is oliguric at this time with mild volume excess and I will order for a furosemide stress test to see if we can augment urine output.  He does not have any acute indications for dialysis at this time but I updated his daughter that he was at a high risk for needing this over the next 24 to 48 hours if unresponsive to furosemide and has worsening volume status/electrolyte abnormalities.  I will order urinalysis and urine electrolytes.  Avoid nephrotoxic medications including NSAIDs and iodinated intravenous contrast exposure unless the latter is absolutely indicated.  Preferred narcotic agents for pain control are hydromorphone, fentanyl, and methadone. Morphine should not be used. Avoid Baclofen and avoid oral sodium phosphate and magnesium citrate based laxatives / bowel preps. Continue strict Input and Output monitoring. Will monitor the patient closely with you and intervene or adjust therapy as indicated by changes in clinical status/labs. 2.  Hypokalemia: Likely total body deficit with chronic alcohol use, replete cautiously especially with ongoing diuretic use. 3.  Hypertension: Blood pressures currently well controlled on a combination of amlodipine, carvedilol and hydralazine.  Continue to hold ACE inhibitor and monitor with diuretic trial. 4.  Acute pontine intraparenchymal hemorrhage with  associated edema: Ongoing blood pressure control with earlier hypertonic saline administration to help mitigate cerebral edema. 5.  Hypernatremia: Medically induced/iatrogenic for management of cerebral edema, will continue to follow with diuresis. 6.  Acute hypoxic respiratory failure: Associated with CVA and on Unasyn for possible aspiration pneumonia  PATEL,JAY K. 01/10/2021, 11:24 AM      

## 2021-01-10 NOTE — Progress Notes (Signed)
Contacted MD at 2230 about pt's lack of urine output and decreased amount of urine production.  Awaiting orders.

## 2021-01-10 NOTE — Progress Notes (Signed)
PT Cancellation Note  Patient Details Name: Timothy Good MRN: [id number] DOB: Oct 13, 1965   Cancelled Treatment:    Reason Eval/Treat Not Completed: Medical issues which prohibited therapy;Fatigue/lethargy limiting ability to participate. Spoke with RN who reported pt has been through a lot today with bathing and family present and managing different lines and leads with him. Pt just now resting comfortably. Will check back later as time allows.   Leighton Roach, PT  Acute Rehab Services  Pager [phone] Office [place] 01/10/2021, 12:41 PM

## 2021-01-10 NOTE — Progress Notes (Signed)
[facility] Progress Note Patient Name: Timothy Good DOB: September 25, 1965 MRN: [id number]   Date of Service  01/10/2021  HPI/Events of Note  Creat 6.58 with poor uop K 3.4 Multiple in and out caths past 24 hrs  eICU Interventions  Gentle potassium replacement Check abg Foley cath Will need nephrology consultation today     Intervention Category Major Interventions: Acute renal failure - evaluation and management  Mauri [place], P 01/10/2021, 3:05 AM

## 2021-01-10 NOTE — Progress Notes (Signed)
NAME:  Timothy Good, MRN:  [id number], DOB:  07/26/1965, LOS: 4 ADMISSION DATE:  01/18/2021, CONSULTATION DATE:  01/28/2021 REFERRING MD:  Curly [place], CHIEF COMPLAINT:  found down   History of Present Illness:  55 y/o male with multiple medical problems was found down by in a well call visit by police and was brought to [facility] with a diagnosis of pontine hemorrhage.  He was intubated for airway protection and then transferred to [facility] for further evaluation.  Pulmonary and critical care medicine was consulted for medical management in the setting of acute hemorrhagic stroke.  The patient was unable to provide history because he was intubated.  Significant Hospital Events: Including procedures, antibiotic start and stop dates in addition to other pertinent events   January 06, 2021 admission, intubation  Interim History / Subjective:   Remains critically ill. Follows commands, dense L hemiparesis  Objective   Blood pressure (!) 140/96, pulse 92, temperature (!) 97.4 F (36.3 C), temperature source Axillary, resp. rate 17, height '6\' 2"'$  (1.88 m), weight 76.1 kg, SpO2 98 %.    Vent Mode: PSV;CPAP FiO2 (%):  [40 %] 40 % Set Rate:  [12 bmp] 12 bmp Vt Set:  [490 mL] 490 mL PEEP:  [5 cmH20] 5 cmH20 Pressure Support:  [5 cmH20-8 cmH20] 5 cmH20 Plateau Pressure:  [10 cmH20-16 cmH20] 11 cmH20   Intake/Output Summary (Last 24 hours) at 01/10/2021 [id number] Last data filed at 01/10/2021 0700 Gross per 24 hour  Intake 3619.6 ml  Output 300 ml  Net 3319.6 ml    Filed Weights   01/11/2021 1448 01/05/2021 1647 01/10/21 0500  Weight: 100.7 kg 70.7 kg 76.1 kg    Examination: General: critically ill intubated on life support  HEENT: NCAT, tracking, opens to stimulation and voice  Neuro: left hemiplegic, right UE and LE movements on commands  Chest: BL vented breaths  Heart: RRR, s1 s2  Abdomen: soft, nt nd  Skin: no rash   Resolved Hospital Problem list     Assessment  & Plan:   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 uncontrolled hypertension Cerebral edema Acute encephalopathy due to hemorrhagic stroke Induced hypernatremia P: Neuro checks  Restarted oral BP meds  Hypertonic, manage Na+ at goal 150-155, off HTS with Na remaining > 150 RASS -1, PAD guidelines  Poor prognosis  Acute respiratory failure with hypoxemia acute hemorrhagic pontine stroke Probable aspiration pneumonia P:  Remains on vent  LTVV, lung protections SAT SBT as tolerated, metabolic acidosis precludes extubation CTX for aspiration vs CAP LRCx with OP flora  Acute kidney injury on Chronic kidney disease stage IV: Cr rising. Tried hydration 10/10. Neprho consult 10/11. Hypokalemia  P: Start TF 10/10 Bicarb dropped with AKI, hesitant to extubate given metabolic acidosis  Uncontrolled hypertension Continue oral BP meds, BP better controlled  Alcohol dependence Thiamine folate Watch for withdrawal   Probable hyperthyroidism TSH is low  Check t3 t4    Best Practice (right click and "Reselect all SmartList Selections" daily)   Diet/type: tubefeeds DVT prophylaxis: SCD GI prophylaxis: H2B Lines: N/A Foley:  Yes, and it is still needed Code Status:  full code Last date of multidisciplinary goals of care discussion [per primary]  Labs   CBC: Recent Labs  Lab 01/12/2021 1308 01/07/2021 1321 01/05/2021 1700 01/07/21 0300 01/08/21 0000 01/10/21 0323  WBC 10.4  --   --  8.8 10.6*  --   NEUTROABS 9.4*  --   --   --   --   --  HGB 10.9* 11.9* 9.5* 9.0* 8.6* 8.2*  HCT 33.4* 35.0* 28.0* 29.4* 28.8* 24.0*  MCV 94.4  --   --  96.7 100.3*  --   PLT 251  --   --  189 157  --      Basic Metabolic Panel: Recent Labs  Lab 01/19/2021 1308 01/16/2021 1321 01/27/2021 1411 01/07/21 0300 01/07/21 1037 01/08/21 0000 01/08/21 0816 01/08/21 1800 01/09/21 0139 01/09/21 0630 01/09/21 1641 01/10/21 0024 01/10/21 0323  NA 141 142   < > 149*   < >  159*   < > 161* 159* 158*  --  151* 152*  K 3.8 3.3*   < > 2.8*  --  3.6  --   --  3.6  --   --  3.4* 3.2*  CL 105 108  --  118*  --  >130*  --   --  >130*  --   --  125*  --   CO2 23  --   --  21*  --  17*  --   --  16*  --   --  16*  --   GLUCOSE 136* 118*  --  113*  --  116*  --   --  80  --   --  106*  --   BUN 34* 38*  --  27*  --  27*  --   --  35*  --   --  47*  --   CREATININE 3.65* 3.50*  --  3.59*  --  3.80*  --   --  5.20*  --   --  6.58*  --   CALCIUM 9.1  --   --  8.9  --  9.0  --   --  8.8*  --   --  9.0  --   MG  --   --    < > 2.2  --  2.2  --   --   --  2.1 2.3 2.2  --   PHOS  --   --   --  4.1  --   --   --   --   --  6.1* 3.6 5.4*  --    < > = values in this interval not displayed.    GFR: Estimated Creatinine Clearance: 14 mL/min (A) (by C-G formula based on SCr of 6.58 mg/dL (H)). Recent Labs  Lab 01/25/2021 1308 01/27/2021 1506 01/07/21 0300 01/08/21 0000  WBC 10.4  --  8.8 10.6*  LATICACIDVEN 2.0* 1.7  --   --      Liver Function Tests: Recent Labs  Lab 01/19/2021 1308 01/07/21 0300  AST 31  --   ALT 15  --   ALKPHOS 71  --   BILITOT 0.8  --   PROT 7.6  --   ALBUMIN 3.3* 2.5*    Recent Labs  Lab 01/27/2021 1308  LIPASE 28    No results for input(s): AMMONIA in the last 168 hours.  ABG    Component Value Date/Time   PHART 7.323 (L) 01/10/2021 0323   PCO2ART 32.6 01/10/2021 0323   PO2ART 162 (H) 01/10/2021 0323   HCO3 17.0 (L) 01/10/2021 0323   TCO2 18 (L) 01/10/2021 0323   ACIDBASEDEF 8.0 (H) 01/10/2021 0323   O2SAT 99.0 01/10/2021 0323      Coagulation Profile: Recent Labs  Lab 01/13/2021 1308  INR 1.0     Cardiac Enzymes: Recent Labs  Lab 01/08/2021 1801 01/09/21 1641  CKTOTAL  419* 676*     HbA1C: Hgb A1c MFr Bld  Date/Time Value Ref Range Status  01/07/2021 10:37 AM 5.4 4.8 - 5.6 % Final    Comment:    (NOTE) Pre diabetes:          5.7%-6.4%  Diabetes:              >6.4%  Glycemic control for   <7.0% adults with  diabetes     CBG: Recent Labs  Lab 01/09/21 1159 01/09/21 1547 01/09/21 2013 01/09/21 2336 01/10/21 0343  GLUCAP 89 83 93 99 91      CRITICAL CARE Performed by: Bonna Gains Agape Hardiman   Total critical care time: 32 minutes  Critical care time was exclusive of separately billable procedures and treating other patients.  Critical care was necessary to treat or prevent imminent or life-threatening deterioration.  Critical care was time spent personally by me on the following activities: development of treatment plan with patient and/or surrogate as well as nursing, discussions with consultants, evaluation of patient's response to treatment, examination of patient, obtaining history from patient or surrogate, ordering and performing treatments and interventions, ordering and review of laboratory studies, ordering and review of radiographic studies, pulse oximetry and re-evaluation of patient's condition.   Lanier Clam, MD [facility] 01/10/2021 9:07 AM

## 2021-01-11 ENCOUNTER — Inpatient Hospital Stay (HOSPITAL_COMMUNITY): Payer: Medicaid Other

## 2021-01-11 DIAGNOSIS — I613 Nontraumatic intracerebral hemorrhage in brain stem: Secondary | ICD-10-CM | POA: Diagnosis not present

## 2021-01-11 LAB — CULTURE, BLOOD (ROUTINE X 2)
Culture: NO GROWTH
Culture: NO GROWTH
Special Requests: ADEQUATE

## 2021-01-11 LAB — BASIC METABOLIC PANEL
Anion gap: 12 (ref 5–15)
BUN: 50 mg/dL — ABNORMAL HIGH (ref 6–20)
CO2: 15 mmol/L — ABNORMAL LOW (ref 22–32)
Calcium: 8.8 mg/dL — ABNORMAL LOW (ref 8.9–10.3)
Chloride: 116 mmol/L — ABNORMAL HIGH (ref 98–111)
Creatinine, Ser: 6.27 mg/dL — ABNORMAL HIGH (ref 0.61–1.24)
GFR, Estimated: 10 mL/min — ABNORMAL LOW (ref >60)
Glucose, Bld: 116 mg/dL — ABNORMAL HIGH (ref 70–99)
Potassium: 3.5 mmol/L (ref 3.5–5.1)
Sodium: 143 mmol/L (ref 135–145)

## 2021-01-11 LAB — GLUCOSE, CAPILLARY
Glucose-Capillary: 103 mg/dL — ABNORMAL HIGH (ref 70–99)
Glucose-Capillary: 112 mg/dL — ABNORMAL HIGH (ref 70–99)
Glucose-Capillary: 119 mg/dL — ABNORMAL HIGH (ref 70–99)
Glucose-Capillary: 120 mg/dL — ABNORMAL HIGH (ref 70–99)
Glucose-Capillary: 123 mg/dL — ABNORMAL HIGH (ref 70–99)
Glucose-Capillary: 124 mg/dL — ABNORMAL HIGH (ref 70–99)

## 2021-01-11 IMAGING — DX DG ABD PORTABLE 1V
1 series · 1 of 1 positions shown · non-contrast
Comparison: None.

CLINICAL DATA: Nasogastric tube placement

EXAM:
PORTABLE ABDOMEN - 1 VIEW

[abdomen]
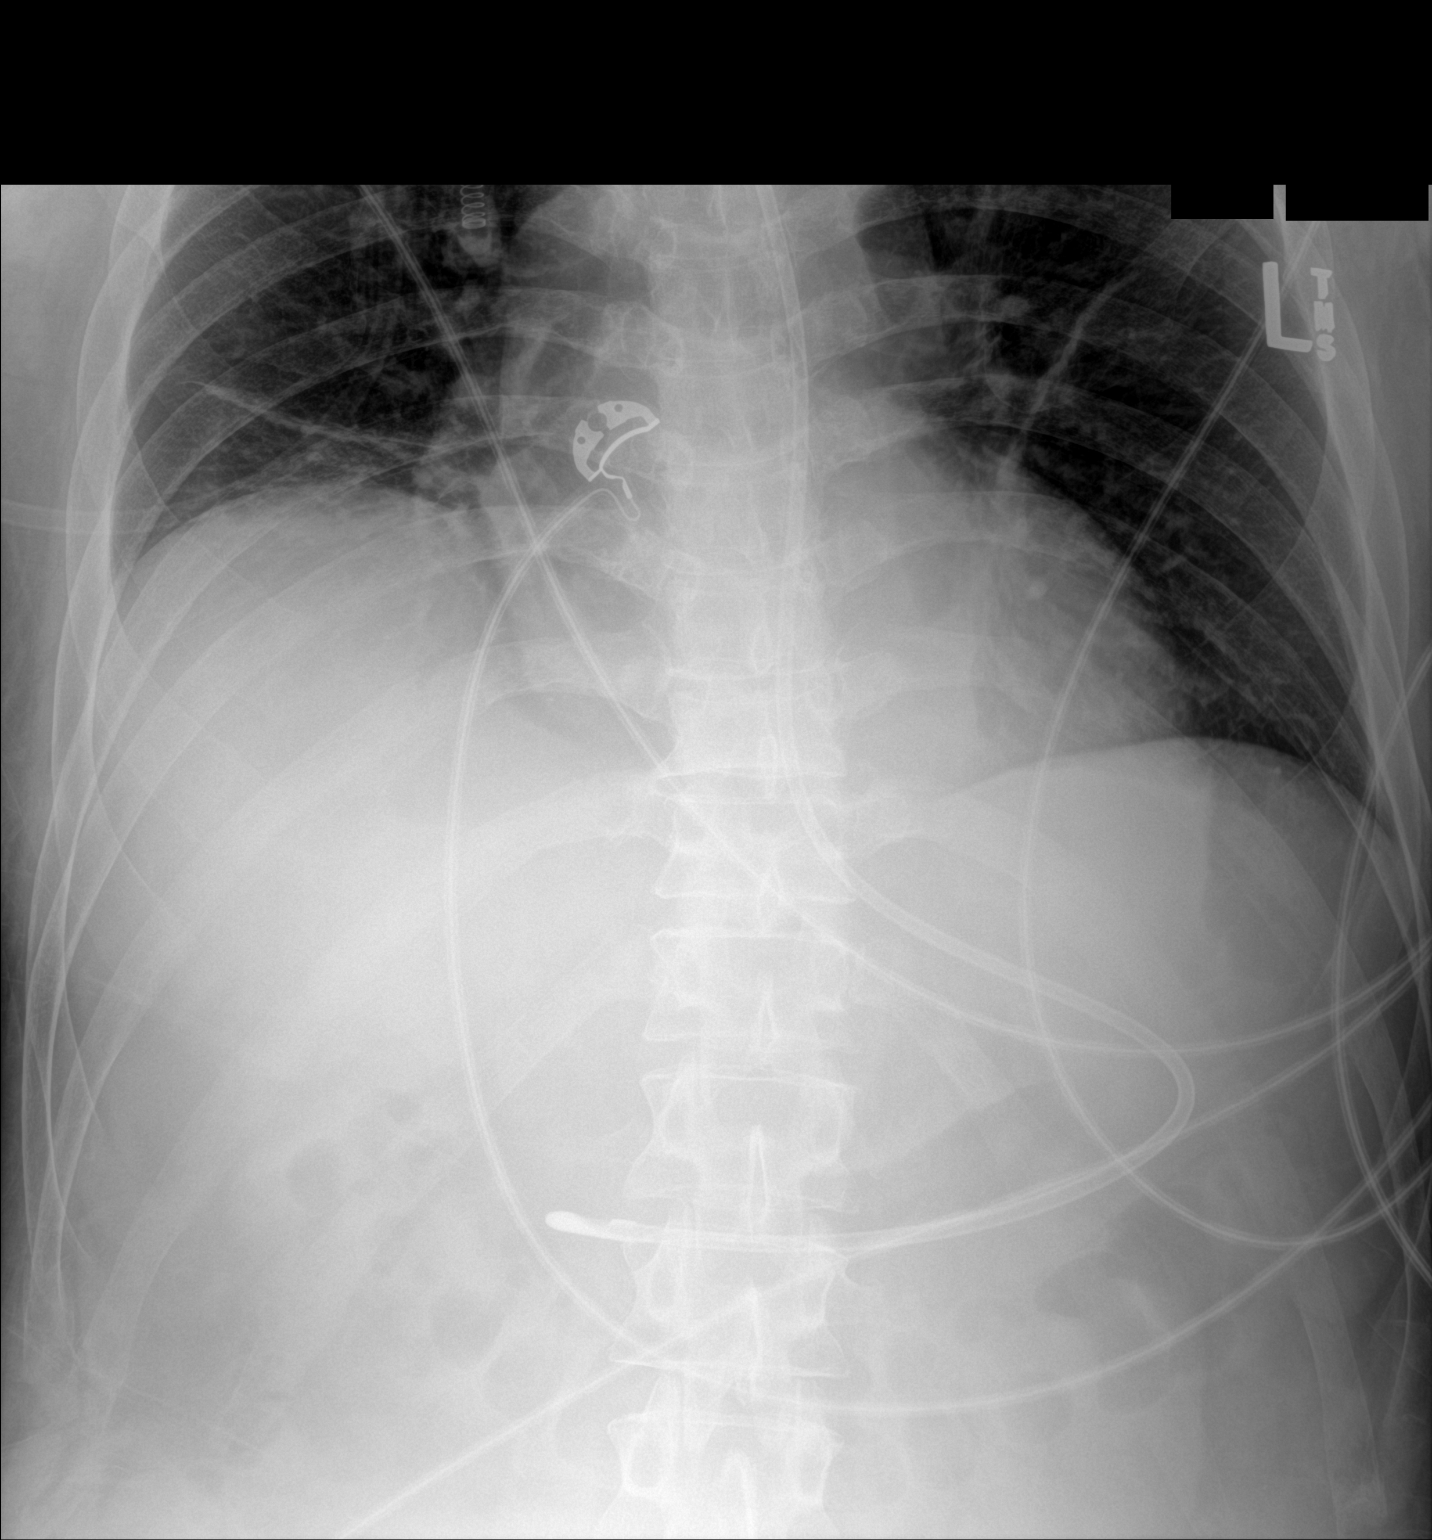

[1 of 1 positions shown; findings below may reference images not displayed]

FINDINGS: Enteric feeding tube is positioned below the diaphragm with tip in
the vicinity of the pylorus. Nonobstructive pattern of included
bowel gas.
IMPRESSION: Enteric feeding tube is positioned below the diaphragm with tip in
the vicinity of the pylorus. Consider advancement if post pyloric
positioning is desired.

## 2021-01-11 MED ORDER — FUROSEMIDE 10 MG/ML IJ SOLN
120.0000 mg | Freq: Once | INTRAVENOUS | Status: AC
  Administered 2021-01-11: 120 mg via INTRAVENOUS
  Filled 2021-01-11: qty 10

## 2021-01-11 MED ORDER — POTASSIUM CHLORIDE 20 MEQ PO PACK
20.0000 meq | PACK | Freq: Two times a day (BID) | ORAL | Status: AC
  Administered 2021-01-11 – 2021-01-12 (×3): 20 meq
  Filled 2021-01-11 (×3): qty 1

## 2021-01-11 MED ORDER — FREE WATER
200.0000 mL | Freq: Four times a day (QID) | Status: DC
  Administered 2021-01-11 – 2021-01-23 (×44): 200 mL

## 2021-01-11 NOTE — Progress Notes (Signed)
Patient ID: Timothy Good, male   DOB: [birth year], 55 y.o.   MRN: [id number] [facility] Progress Note   Assessment/ Plan:   1.  Acute kidney injury on chronic kidney disease stage III-IV: Underlying chronic kidney disease from hypertension (followed by Dr. Theador Hawthorne in [place]) and his acute kidney injury appears to be from ischemic ATN.  Responded well to trial of furosemide yesterday with good urine output and surprising improvement of renal function seen on labs today.  We will continue to augment diuresis with intermittent doses of furosemide. 2.  Hypokalemia: Likely total body deficit with chronic alcohol use, continue repletion with ongoing diuretic therapy 3.  Hypertension: Blood pressures currently well controlled on a combination of amlodipine, carvedilol and hydralazine.  Continue to hold ACE inhibitor and monitor with diuretic trial. 4.  Acute pontine intraparenchymal hemorrhage with associated edema: Ongoing blood pressure control with earlier hypertonic saline administration to help mitigate cerebral edema. 5.  Hypernatremia: Medically induced/iatrogenic for management of cerebral edema, will continue to follow with diuresis and adjust free water. 6.  Acute hypoxic respiratory failure: Associated with CVA and on Unasyn for possible aspiration pneumonia  Subjective:   Without acute events overnight, good response to furosemide.   Objective:   BP 126/77   Pulse (!) 101   Temp 98.8 F (37.1 C) (Axillary)   Resp (!) 24   Ht $R'6\' 2"'xK$  (1.88 m)   Wt 76.1 kg   SpO2 99%   BMI 21.54 kg/m   Intake/Output Summary (Last 24 hours) at 01/11/2021 8206 Last data filed at 01/11/2021 0700 Gross per 24 hour  Intake 3469.64 ml  Output 2025 ml  Net 1444.64 ml   Weight change:   Physical Exam: Gen: Intubated, awakens to calling out his name CVS: Pulse regular tachycardia, S1 and S2 normal without murmur/gallop Resp: Clear to auscultation bilaterally, no distinct rales or  rhonchi Abd: Soft, obese, nontender, bowel sounds normal.  NGT in situ Ext: Trace lower extremity edema, 1-2+ upper extremity edema  Imaging: No results found.  Labs: BMET Recent Labs  Lab 01/12/2021 1308 01/09/2021 1321 01/17/2021 1506 01/09/2021 1700 01/09/2021 2109 01/07/21 0300 01/07/21 1037 01/08/21 0000 01/08/21 0816 01/08/21 1800 01/09/21 0139 01/09/21 0630 01/09/21 1641 01/10/21 0024 01/10/21 0323 01/10/21 1859 01/11/21 0224  NA 141 142   < > 149*   < > 149*   < > 159* 161* 161* 159* 158*  --  151* 152*  --  143  K 3.8 3.3*  --  2.9*  --  2.8*  --  3.6  --   --  3.6  --   --  3.4* 3.2*  --  3.5  CL 105 108  --   --   --  118*  --  >130*  --   --  >130*  --   --  125*  --   --  116*  CO2 23  --   --   --   --  21*  --  17*  --   --  16*  --   --  16*  --   --  15*  GLUCOSE 136* 118*  --   --   --  113*  --  116*  --   --  80  --   --  106*  --   --  116*  BUN 34* 38*  --   --   --  27*  --  27*  --   --  35*  --   --  47*  --   --  50*  CREATININE 3.65* 3.50*  --   --   --  3.59*  --  3.80*  --   --  5.20*  --   --  6.58*  --   --  6.27*  CALCIUM 9.1  --   --   --   --  8.9  --  9.0  --   --  8.8*  --   --  9.0  --   --  8.8*  PHOS  --   --   --   --   --  4.1  --   --   --   --   --  6.1* 3.6 5.4*  --  4.3  --    < > = values in this interval not displayed.   CBC Recent Labs  Lab 01/23/2021 1308 01/13/2021 1321 01/15/2021 1700 01/07/21 0300 01/08/21 0000 01/10/21 0323  WBC 10.4  --   --  8.8 10.6*  --   NEUTROABS 9.4*  --   --   --   --   --   HGB 10.9*   < > 9.5* 9.0* 8.6* 8.2*  HCT 33.4*   < > 28.0* 29.4* 28.8* 24.0*  MCV 94.4  --   --  96.7 100.3*  --   PLT 251  --   --  189 157  --    < > = values in this interval not displayed.   Medications:     amLODipine  10 mg Per Tube Daily   carvedilol  12.5 mg Per Tube BID WC   chlorhexidine gluconate (MEDLINE KIT)  15 mL Mouth Rinse BID   Chlorhexidine Gluconate Cloth  6 each Topical Daily   feeding supplement  (PROSource TF)  45 mL Per Tube BID   folic acid  1 mg Per Tube Daily   free water  200 mL Per Tube Q6H   hydrALAZINE  100 mg Per Tube Q8H   insulin aspart  0-6 Units Subcutaneous Q4H   mouth rinse  15 mL Mouth Rinse 10 times per day   multivitamin with minerals  1 tablet Per Tube Daily   pantoprazole sodium  40 mg Per Tube Daily   thiamine  100 mg Per Tube Daily   Elmarie Shiley, MD 01/11/2021, 9:58 AM

## 2021-01-11 NOTE — Progress Notes (Addendum)
STROKE TEAM PROGRESS NOTE   INTERVAL HISTORY Today he remains intubated for respiratory failure on precedex sedation.  Tmax 100.1, BP adequately controlled no further episodes of bradycardia  or hypothermia. since yesterday  He remains alert, following commands. Creatinine 3.8->5.2 on IVF and free water. HTS has been stopped. He is on Rocephin for pneumonia. We explained his plan of care. He was attentive to examiners.  His neurological exam is unchanged.  Responded well to trial of lasix yesterday with good urine output and improvement of renal function.   No one is at the  bedside today at time of this exam. No plans for trach at this time.  Neurological exam is unchanged today  Vitals:   01/11/21 1400 01/11/21 1500 01/11/21 1549 01/11/21 1600  BP: 115/78 123/81 123/81 128/79  Pulse: (!) 103 96  (!) 102  Resp: (!) 24 (!) 24  (!) 29  Temp:      TempSrc:      SpO2: 98% 96% 98% 95%  Weight:      Height:       CBC:  Recent Labs  Lab 01/04/2021 1308 01/02/2021 1321 01/07/21 0300 01/08/21 0000 01/10/21 0323  WBC 10.4  --  8.8 10.6*  --   NEUTROABS 9.4*  --   --   --   --   HGB 10.9*   < > 9.0* 8.6* 8.2*  HCT 33.4*   < > 29.4* 28.8* 24.0*  MCV 94.4  --  96.7 100.3*  --   PLT 251  --  189 157  --    < > = values in this interval not displayed.   Basic Metabolic Panel:  Recent Labs  Lab 01/10/21 0024 01/10/21 0323 01/10/21 1859 01/11/21 0224  NA 151* 152*  --  143  K 3.4* 3.2*  --  3.5  CL 125*  --   --  116*  CO2 16*  --   --  15*  GLUCOSE 106*  --   --  116*  BUN 47*  --   --  50*  CREATININE 6.58*  --   --  6.27*  CALCIUM 9.0  --   --  8.8*  MG 2.2  --  1.8  --   PHOS 5.4*  --  4.3  --    Lipid Panel:  Recent Labs  Lab 01/08/21 0000 01/08/21 0817  CHOL 184  --   TRIG 71 76  HDL 95  --   CHOLHDL 1.9  --   VLDL 14  --   LDLCALC 75  --    HgbA1c:  Recent Labs  Lab 01/07/21 1037  HGBA1C 5.4   Urine Drug Screen:  Recent Labs  Lab 01/25/2021 1310   LABOPIA NONE DETECTED  COCAINSCRNUR NONE DETECTED  LABBENZ NONE DETECTED  AMPHETMU NONE DETECTED  THCU NONE DETECTED  LABBARB NONE DETECTED    Alcohol Level  Recent Labs  Lab 01/19/2021 1411  ETH <10    IMAGING past 24 hours DG Abd Portable 1V  Result Date: 01/11/2021 CLINICAL DATA:  Nasogastric tube placement EXAM: PORTABLE ABDOMEN - 1 VIEW COMPARISON:  None. FINDINGS: Enteric feeding tube is positioned below the diaphragm with tip in the vicinity of the pylorus. Nonobstructive pattern of included bowel gas. IMPRESSION: Enteric feeding tube is positioned below the diaphragm with tip in the vicinity of the pylorus. Consider advancement if post pyloric positioning is desired. Electronically Signed   By: Delanna Ahmadi M.D.   On: 01/11/2021 13:21  PHYSICAL EXAM Physical Exam  Constitutional: Appears thin, chronically ill who is intubated and sedated Eyes: No scleral injection. Proptotic, which is new per family, right worse than left.  Right eye also has some exudate/clouding and is chemotic HENT: No oropharyngeal obstruction.  MSK: no joint deformities.  Cardiovascular: Tachycardia 100s, SR on cardiac monitor  Respiratory: Intubated, not labored,symmetric rise  Skin: Warm dry and intact visible skin   Neuro: Mental Status: Patient is  Awake, intubated, off sedation. Following simple but not complex commands. Eyes open.  Cranial Nerves: II: Pupils are equal, round, and reactive to light.  2 mm -> 1 mm reactive, III,IV, VI: EOM w/ occular bobbing, mildly dysconjugate gaze V: Facial sensation corneals bilaterally intact VII: Facial movement is notable for upper face more reactive on the left than the right, lower face limited eval due to ETT VIII: hearing is intact to voice X/XI: Gag present  Motor: Antigravity on LUE, RUE weakly following commands with the hand, Antigravity LLE  and 2/5 RLE  Sensory: Less reactive on the left than the right Cerebellar: UTA      ASSESSMENT/PLAN Timothy Good is a 55 y.o. male with a past medical history significant for uncontrolled hypertension complicated by CKD stage IV, heart failure, hyperlipidemia, daily drinking, medication nonadherence, iron deficiency anemia/anemia of chronic disease, presenting with a pontine hemorrhage. ICH Score: 3. NIHSS 24. Family reported he had some difficulty swallowing for a few days and had been complaining of GI discomfort.   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   Code Stroke  1. Examination is positive for hemorrhagic infarct within the brainstem. 2. Posterior extension of clot into the fourth ventricle and the cisterna magna. 3. Chronic small vessel ischemic disease and brain atrophy. MRI   1. Acute brainstem hemorrhage centered at the pons with estimated intra-axial blood volume of 10 mL. Extensive pontine edema, which tracks associated edema which tracks into the midbrain right > left and into the right medullary pyramid. 2. Mild extension of hemorrhage into the 4th ventricle and basilar subarachnoid spaces. But no ventriculomegaly. No midline shift or impending herniation. 3. Underlying advanced chronic small vessel disease including multiple chronic microhemorrhages in the cerebellum and bilateral deep gray matter nuclei. Advanced bilateral cerebral white matter ischemia. 4. Negative noncontrast MRI appearance of the orbits. MR Orbits Negative noncontrast MRI appearance of the orbits.  2D Echo  1. Left ventricular ejection fraction, by estimation, is 55 to 60%. The  left ventricle has normal function. The left ventricle demonstrates  regional wall motion abnormalities. Basal inferior akinesis.There is moderate asymmetric left ventricular  hypertrophy of the basal-septal segment. Left ventricular diastolic parameters are consistent with Grade I diastolic dysfunction (impaired relaxation).   2.  Right ventricular systolic function is normal. The right ventricular  size is normal. Tricuspid regurgitation signal is inadequate for assessing PA pressure.   3. The mitral valve is normal in structure. No evidence of mitral valve regurgitation.   4. The aortic valve was not well visualized. Aortic valve regurgitation is mild. No aortic stenosis is present.   5. The inferior vena cava is normal in size with greater than 50%  Resp **The interatrial septum was not well visualized.   CT Chest Abd Pelvis 1. Suggestion of generalized gastric wall thickening, poorly assessed on this scan due to gastric decompression by enteric tube, with associated fat stranding in the gastrohepatic ligament. Findings are nonspecific and could be due to noninflammatory edema,gastritis, peptic ulcer  disease or neoplasm. Consider upper endoscopic correlation as clinically warranted. 2. Small volume ascites, predominantly perihepatic. 3. Prominently distended gallbladder. No radiopaque cholelithiasis. No gallbladder wall thickening. No pericholecystic fluid. No biliary ductal dilatation. 4. Moderate diffuse colonic diverticulosis. 5. Mild to moderate dependent bibasilar atelectasis. 6. Coronary atherosclerosis. 7. Chronic appearing mild T10 vertebral compression fracture. 8. Aortic Atherosclerosis (ICD10-I70.0).  LDL 75 HgbA1c 5.4 VTE prophylaxis - SCDs, lovenox     Diet   Diet NPO time specified   Not on Anticoagulant or antiplatelet prior to admission Not taking Rx medications prior to admission Hold anticoagulant/antiplatelet medications in the setting of hemorrhage Therapy recommendations:  pending patient status  Disposition:  TBD       Cerebral Edema Neurosurgery consulted, no acute intervention recommended at this time. Hypertonic saline infusion has been discontinued, allow sodium to trend down with daily check   Na 149->147->153->158->159->161->159       Acute respiratory failure       Probable aspiration pneumonia Remains intubated, appreciate CCM management First on Unasyn, now on Rocephin Resp culture: few gram positive cocci Precedex off     Hypertension Home meds: not taking any prescribed meds >1 year  Unstable Keep systolic BP less than [id number]  Long-term BP goal normotensive  Hyperlipidemia Home meds:  None LDL  75, almost at goal < 70 High intensity statin to be addressed at discharge in setting of pontine hemorrhage Continue statin at discharge       Glycemic management No hx of DM2  HgbA1c  5.4, at goal < 7.0 CBGs Recent Labs    01/11/21 0758 01/11/21 1148 01/11/21 1536  GLUCAP 103* 119* 123*   SSI       AKI on CKD IV Creatinine 3.65->3.80->-5.2 LR x 8 hours  and free water  ?urinary retention vs. oliguria, ?need to replace foley CCM on board         Hypokalemia Monitor and replete as needed 3.9->2.8->3.6-       ETOH use disorder with dependence Daily use of excessive liquor per family (?1/2 bottle vodka)  Thiamine, folate and MVI on board Monitor magnesium, 2.2->2.2->2.1 Vitamin B12 -213 Monitor s/s of withdrawal, need for CIWA        Feeding      Dysphagia Continue tube feeding and free water        Abd discomfort on presentation CT, Chest Abd Pelvis: generalized gastric wall thickening, with associated fat stranding in the gastrohepatic ligament.Findings are nonspecific and could be due to noninflammatory edema,gastritis, peptic ulcer disease or neoplasm.  CCM, Dr. Valeta Harms: nothing acutely concerning   Other Stroke Risk Factors Current Cigarette smoker, will be advised to stop smoking ETOH use disorder alcohol level <10, advised to drink no more than 2 drink(s) a day  Other Active Problems ? 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 Agree with note above.  Patient neurological exam remains unchanged and blood pressure is adequately controlled.  Renal function is improving after diuresis.  Appreciate nephrology help.  Wean off ventilatory support as per CCM team.  Discussed with Dr. Silas Flood and Dr. Posey Pronto.  No family available at bedside for discussion today   .  This patient is critically ill and at significant risk of neurological worsening, death and care requires constant monitoring of vital signs, hemodynamics,respiratory and cardiac monitoring, extensive review of multiple databases, frequent neurological assessment, discussion with family, other specialists and medical decision making of high complexity.I have made any additions or clarifications directly to the above note.This critical care time does not reflect procedure time, or teaching time or supervisory time of PA/NP/Med Resident etc but could involve care discussion time.  I spent 35 minutes of neurocritical care time  in the care of  this patient. Antony Contras MD    To contact Stroke Continuity provider, please refer to [email or web address] After hours, contact General Neurology

## 2021-01-11 NOTE — Progress Notes (Signed)
Occupational Therapy Treatment Patient Details Name: Timothy Good MRN: [id number] DOB: April 19, 1965 Today's Date: 01/11/2021   History of present illness 55 y.o. male presents to [facility] hospital on 01/14/2021, found down during a well check. Pt was emergently intubated and head CT revealed pontine hemorrhage. PMH includes HTN, CKD stage IV, HLD, alcohol abuse, anemia.   OT comments  Pt progressing to EOB sitting this session with dependence on therapist. Pt following simple commands thumbs up , 2 digits and squeeze with R UE. Pt with no activation noted on L UE/LE. Pt dependence for all care on vent oral intubation. Recommendation SNf at this time.    Recommendations for follow up therapy are one component of a multi-disciplinary discharge planning process, led by the attending physician.  Recommendations may be updated based on patient status, additional functional criteria and insurance authorization.    Follow Up Recommendations  SNF    Equipment Recommendations  3 in 1 bedside commode;Wheelchair (measurements OT);Wheelchair cushion (measurements OT);Hospital bed    Recommendations for Other Services      Precautions / Restrictions Precautions Precautions: Fall Precaution Comments: intubated       Mobility Bed Mobility Overal bed mobility: Needs Assistance Bed Mobility: Supine to Sit;Sit to Supine     Supine to sit: +2 for physical assistance;Total assist Sit to supine: +2 for physical assistance;Total assist   General bed mobility comments: pt progressed to eob with total +2 total (A) with increases secretions. pt with suction by yaunkers then side lying to allow RR to decrease from 52 then back to sitting for 5 minutes total (A). pt returning sit to supine total +2 total    Transfers                      Balance Overall balance assessment: Needs assistance Sitting-balance support: No upper extremity supported;Feet supported Sitting balance-Leahy Scale: Zero                                      ADL either performed or assessed with clinical judgement   ADL Overall ADL's : Needs assistance/impaired Eating/Feeding: NPO   Grooming: Total assistance   Upper Body Bathing: Cueing for sequencing   Lower Body Bathing: Cueing for safety   Upper Body Dressing : Cueing for sequencing   Lower Body Dressing: Cueing for safety                 General ADL Comments: total (A) for all adls     Vision   Additional Comments: R eye red, glass appearance and bulging R side   Perception     Praxis      Cognition Arousal/Alertness: Awake/alert Behavior During Therapy: Flat affect Overall Cognitive Status: Difficult to assess                                 General Comments: pt follows one step commands well to move R side during session        Exercises Exercises: Other exercises Other Exercises Other Exercises: AAROM R UE hand digits, AAROM shoulder flexion, elbow flexion   Shoulder Instructions       General Comments 40% 5peep CPAP RR increased 52  to RR23    Pertinent Vitals/ Pain       Pain Assessment: No/denies pain  Home Living  Prior Functioning/Environment              Frequency  Min 2X/week        Progress Toward Goals  OT Goals(current goals can now be found in the care plan section)  Progress towards OT goals: Progressing toward goals  Acute Rehab OT Goals Patient Stated Goal: Pt unable to state 2/2 intubated, OT Goal Formulation: Patient unable to participate in goal setting Time For Goal Achievement: 01/22/21 Potential to Achieve Goals: Fair ADL Goals Pt Will Perform Grooming: with min assist;sitting Pt Will Perform Upper Body Bathing: with min assist;sitting Pt Will Transfer to Toilet: with min assist;squat pivot transfer;stand pivot transfer;bedside commode Pt/caregiver will Perform Home Exercise Program:  Increased ROM;Increased strength;Right Upper extremity;With written HEP provided Additional ADL Goal #1: Pt will be Min A to come up to EOB in prep for transfers and basic ADLs Additional ADL Goal #2: Continue to assess vision  Plan Discharge plan remains appropriate    Co-evaluation    PT/OT/SLP Co-Evaluation/Treatment: Yes Reason for Co-Treatment: Complexity of the patient's impairments (multi-system involvement);Necessary to address cognition/behavior during functional activity;For patient/therapist safety;To address functional/ADL transfers   OT goals addressed during session: ADL's and self-care;Proper use of Adaptive equipment and DME;Strengthening/ROM      AM-PAC OT "6 Clicks" Daily Activity     Outcome Measure   Help from another person eating meals?: Total Help from another person taking care of personal grooming?: Total Help from another person toileting, which includes using toliet, bedpan, or urinal?: Total Help from another person bathing (including washing, rinsing, drying)?: Total Help from another person to put on and taking off regular upper body clothing?: Total Help from another person to put on and taking off regular lower body clothing?: Total 6 Click Score: 6    End of Session Equipment Utilized During Treatment: Oxygen  OT Visit Diagnosis: Muscle weakness (generalized) (M62.81);Other abnormalities of gait and mobility (R26.89);Low vision, both eyes (H54.2);Other symptoms and signs involving cognitive function;Hemiplegia and hemiparesis Hemiplegia - Right/Left: Left Hemiplegia - dominant/non-dominant: Dominant   Activity Tolerance Patient tolerated treatment well   Patient Left in bed;with call bell/phone within reach;with bed alarm set;with SCD's reapplied   Nurse Communication Mobility status;Precautions        Time: [id number] OT Time Calculation (min): 21 min  Charges: OT General Charges $OT Visit: 1 Visit OT Treatments $Self Care/Home  Management : 8-22 mins   Brynn, OTR/L  Acute Rehabilitation Services Pager: [phone] Office: [phone] .   Jeri Modena 01/11/2021, 3:45 PM

## 2021-01-11 NOTE — Progress Notes (Signed)
SLP Cancellation Note  Patient Details Name: Timothy Good MRN: [id number] DOB: [birth year]   Cancelled evaluation: remains on the ventilator. Will follow for readiness.  [name] L. [name], [place] CCC/SLP Acute Rehabilitation Services Office number [phone] Pager [phone]        [name] 01/11/2021, 10:08 AM

## 2021-01-11 NOTE — Progress Notes (Signed)
NAME:  Timothy Good, MRN:  [id number], DOB:  Nov 14, 1965, LOS: 5 ADMISSION DATE:  01/10/2021, CONSULTATION DATE:  01/28/2021 REFERRING MD:  [place], CHIEF COMPLAINT:  found down   History of Present Illness:  55 y/o male with multiple medical problems was found down by in a well call visit by police and was brought to [facility] with a diagnosis of pontine hemorrhage.  He was intubated for airway protection and then transferred to [facility] for further evaluation.  Pulmonary and critical care medicine was consulted for medical management in the setting of acute hemorrhagic stroke.  The patient was unable to provide history because he was intubated.  Significant Hospital Events: Including procedures, antibiotic start and stop dates in addition to other pertinent events   January 06, 2021 admission, intubation  Interim History / Subjective:   Remains critically ill. Follows commands, dense L hemiparesis, Cr better after lasix challenge, UOP ok at 1.7 L,   Objective   Blood pressure 126/77, pulse (!) 101, temperature 98.8 F (37.1 C), temperature source Axillary, resp. rate (!) 24, height '6\' 2"'$  (1.88 m), weight 76.1 kg, SpO2 99 %.    Vent Mode: PSV;CPAP FiO2 (%):  [40 %] 40 % Set Rate:  [12 bmp] 12 bmp Vt Set:  [490 mL] 490 mL PEEP:  [5 cmH20] 5 cmH20 Pressure Support:  [5 cmH20] 5 cmH20 Plateau Pressure:  [12 cmH20] 12 cmH20   Intake/Output Summary (Last 24 hours) at 01/11/2021 0950 Last data filed at 01/11/2021 0700 Gross per 24 hour  Intake 3469.64 ml  Output 2025 ml  Net 1444.64 ml    Filed Weights   01/19/2021 1448 01/16/2021 1647 01/10/21 0500  Weight: 100.7 kg 70.7 kg 76.1 kg    Examination: General: critically ill intubated on life support  HEENT: NCAT, tracking, opens to stimulation and voice  Neuro: left hemiplegic, right UE and LE movements on commands  Chest: BL vented breaths  Heart: RRR, s1 s2  Abdomen: soft, nt nd  Skin: no rash   Resolved  Hospital Problem list     Assessment & Plan:   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 Nephro consult 10/11. Hypokalemia  P: Start TF 10/10 Lasix challenge 10/11 with improved UOP, Cr Lasix 120 mg IV  Uncontrolled hypertension Continue oral BP meds, BP better controlled  Alcohol dependence Thiamine folate Watch for withdrawal   Probable hyperthyroidism TSH is low  T4 WNL   Best Practice (right click and "Reselect all SmartList Selections" daily)   Diet/type: tubefeeds DVT prophylaxis: SCD GI prophylaxis: H2B Lines: N/A Foley:  Yes, and it is still needed Code Status:  full code Last date of multidisciplinary goals of care discussion [per primary]  Labs   CBC: Recent Labs  Lab 01/12/2021 1308 01/04/2021 1321 01/21/2021 1700 01/07/21 0300 01/08/21 0000 01/10/21 0323  WBC 10.4  --   --  8.8 10.6*  --  NEUTROABS 9.4*  --   --   --   --   --   HGB 10.9* 11.9* 9.5* 9.0* 8.6* 8.2*  HCT 33.4* 35.0* 28.0* 29.4* 28.8* 24.0*  MCV 94.4  --   --  96.7 100.3*  --   PLT 251  --   --  189 157  --      Basic Metabolic Panel: Recent Labs  Lab 01/07/21 0300 01/07/21 1037 01/08/21 0000 01/08/21 0816 01/09/21 0139 01/09/21 0630 01/09/21 1641 01/10/21 0024 01/10/21 0323 01/10/21 1859 01/11/21 0224  NA 149*   < > 159*   <  > 159* 158*  --  151* 152*  --  143  K 2.8*  --  3.6  --  3.6  --   --  3.4* 3.2*  --  3.5  CL 118*  --  >130*  --  >130*  --   --  125*  --   --  116*  CO2 21*  --  17*  --  16*  --   --  16*  --   --  15*  GLUCOSE 113*  --  116*  --  80  --   --  106*  --   --  116*  BUN 27*  --  27*  --  35*  --   --  47*  --   --  50*  CREATININE 3.59*  --  3.80*  --  5.20*  --   --  6.58*  --   --  6.27*  CALCIUM 8.9  --  9.0  --  8.8*  --   --  9.0  --   --  8.8*  MG 2.2  --  2.2  --   --  2.1 2.3 2.2  --  1.8  --   PHOS 4.1  --   --   --   --  6.1* 3.6 5.4*  --  4.3  --    < > = values in this interval not displayed.    GFR: Estimated Creatinine Clearance: 14.7 mL/min (A) (by C-G formula based on SCr of 6.27 mg/dL (H)). Recent Labs  Lab 01/11/2021 1308 01/16/2021 1506 01/07/21 0300 01/08/21 0000  WBC 10.4  --  8.8 10.6*  LATICACIDVEN 2.0* 1.7  --   --      Liver Function Tests: Recent Labs  Lab 01/30/2021 1308 01/07/21 0300  AST 31  --   ALT 15  --   ALKPHOS 71  --   BILITOT 0.8  --   PROT 7.6  --   ALBUMIN 3.3* 2.5*    Recent Labs  Lab 01/05/2021 1308  LIPASE 28    No results for input(s): AMMONIA in the last 168 hours.  ABG    Component Value Date/Time   PHART 7.323 (L) 01/10/2021 0323   PCO2ART 32.6 01/10/2021 0323   PO2ART 162 (H) 01/10/2021 0323   HCO3 17.0 (L) 01/10/2021 0323   TCO2 18 (L) 01/10/2021 0323   ACIDBASEDEF 8.0 (H) 01/10/2021 0323   O2SAT 99.0 01/10/2021 0323      Coagulation Profile: Recent Labs  Lab 01/23/2021 1308  INR 1.0     Cardiac Enzymes: Recent Labs  Lab 01/14/2021 1801 01/09/21 1641  CKTOTAL 419* 676*     HbA1C: Hgb A1c MFr Bld  Date/Time Value Ref Range Status  01/07/2021 10:37 AM 5.4 4.8 - 5.6 % Final    Comment:    (NOTE) Pre diabetes:  5.7%-6.4%  Diabetes:              >6.4%  Glycemic control for   <7.0% adults with diabetes     CBG: Recent Labs  Lab 01/10/21 1543 01/10/21 1943 01/10/21 2342  01/11/21 0347 01/11/21 0758  GLUCAP 107* 124* 105* 124* 103*      CRITICAL CARE Performed by: Bonna Gains Chamia Schmutz   Total critical care time: 34 minutes  Critical care time was exclusive of separately billable procedures and treating other patients.  Critical care was necessary to treat or prevent imminent or life-threatening deterioration.  Critical care was time spent personally by me on the following activities: development of treatment plan with patient and/or surrogate as well as nursing, discussions with consultants, evaluation of patient's response to treatment, examination of patient, obtaining history from patient or surrogate, ordering and performing treatments and interventions, ordering and review of laboratory studies, ordering and review of radiographic studies, pulse oximetry and re-evaluation of patient's condition.   Lanier Clam, MD [facility] 01/11/2021 9:50 AM

## 2021-01-11 NOTE — Procedures (Signed)
Cortrak ? ?Tube Type:  Cortrak - 43 inches ?Tube Location:  Left nare ?Initial Placement:  Stomach ?Secured by: Bridle ?Technique Used to Measure Tube Placement:  Marking at nare/corner of mouth ?Cortrak Secured At:  75 cm ? ?Cortrak Tube Team Note: ? ?Consult received to place a Cortrak feeding tube.  ? ?X-ray is required, abdominal x-ray has been ordered by the Cortrak team. Please confirm tube placement before using the Cortrak tube.  ? ?If the tube becomes dislodged please keep the tube and contact the Cortrak team at [email or web address] (password [email or web address]) for replacement.  ?If after hours and replacement cannot be delayed, place a NG tube and confirm placement with an abdominal x-ray.  ? ? ?Dearra Myhand MS, RD, LDN ?Please refer to AMION for RD and/or RD on-call/weekend/after hours pager ? ? ?

## 2021-01-11 NOTE — Progress Notes (Signed)
Physical Therapy Treatment Patient Details Name: Timothy Good MRN: [id number] DOB: 17-Nov-1965 Today's Date: 01/11/2021   History of Present Illness 55 y.o. male presents to [facility] hospital on 01/15/2021, found down during a well check. Pt was emergently intubated and head CT revealed pontine hemorrhage. PMH includes HTN, CKD stage IV, HLD, alcohol abuse, anemia.    PT Comments    Patient continues to be dependent on therapists for mobility, requiring totalA+2 to come to EOB. TotalA to maintain sitting balance. Following commands well with R side. Recommending CIR at this time in hopes patient will progress well once off vent.     Recommendations for follow up therapy are one component of a multi-disciplinary discharge planning process, led by the attending physician.  Recommendations may be updated based on patient status, additional functional criteria and insurance authorization.  Follow Up Recommendations  CIR;Supervision/Assistance - 24 hour     Equipment Recommendations  Wheelchair (measurements PT);Wheelchair cushion (measurements PT);Hospital bed;Other (comment) (hoyer lift)    Recommendations for Other Services       Precautions / Restrictions Precautions Precautions: Fall Precaution Comments: intubated     Mobility  Bed Mobility Overal bed mobility: Needs Assistance Bed Mobility: Supine to Sit;Sit to Supine     Supine to sit: +2 for physical assistance;Total assist Sit to supine: +2 for physical assistance;Total assist   General bed mobility comments: pt progressed to eob with total +2 total (A) with increases secretions. pt with suction by yaunkers then side lying to allow RR to decrease from 52 then back to sitting for 5 minutes total (A). pt returning sit to supine total +2 total    Transfers                    Ambulation/Gait                 Stairs             Wheelchair Mobility    Modified Rankin (Stroke Patients Only)        Balance Overall balance assessment: Needs assistance Sitting-balance support: No upper extremity supported;Feet supported Sitting balance-Leahy Scale: Zero                                      Cognition Arousal/Alertness: Awake/alert Behavior During Therapy: Flat affect Overall Cognitive Status: Difficult to assess                                 General Comments: pt follows one step commands well to move R side during session      Exercises Other Exercises Other Exercises: AAROM R LE hip flexion, knee extension, ankle DF/PF. PROM L LE hip flexion, knee flexion/extension, and ankle DF/PF    General Comments General comments (skin integrity, edema, etc.): 40% 5peep CPAP RR increased 52  to RR23      Pertinent Vitals/Pain Pain Assessment: No/denies pain    Home Living                      Prior Function            PT Goals (current goals can now be found in the care plan section) Acute Rehab PT Goals Patient Stated Goal: Pt unable to state 2/2 intubated, PT Goal Formulation: Patient unable to participate in goal setting  Time For Goal Achievement: 01/22/21 Potential to Achieve Goals: Fair Progress towards PT goals: Progressing toward goals    Frequency    Min 3X/week (until off vent)      PT Plan Frequency needs to be updated    Co-evaluation PT/OT/SLP Co-Evaluation/Treatment: Yes Reason for Co-Treatment: Complexity of the patient's impairments (multi-system involvement);For patient/therapist safety;Necessary to address cognition/behavior during functional activity;To address functional/ADL transfers PT goals addressed during session: Mobility/safety with mobility;Balance        AM-PAC PT "6 Clicks" Mobility   Outcome Measure  Help needed turning from your back to your side while in a flat bed without using bedrails?: Total Help needed moving from lying on your back to sitting on the side of a flat bed without using  bedrails?: Total Help needed moving to and from a bed to a chair (including a wheelchair)?: Total Help needed standing up from a chair using your arms (e.g., wheelchair or bedside chair)?: Total Help needed to walk in hospital room?: Total Help needed climbing 3-5 steps with a railing? : Total 6 Click Score: 6    End of Session Equipment Utilized During Treatment: Oxygen Activity Tolerance: Patient tolerated treatment well Patient left: in bed;with call bell/phone within reach;with restraints reapplied Nurse Communication: Mobility status;Need for lift equipment PT Visit Diagnosis: Other abnormalities of gait and mobility (R26.89);Muscle weakness (generalized) (M62.81);Other symptoms and signs involving the nervous system [id number])     Time: 1314-1340 PT Time Calculation (min) (ACUTE ONLY): 26 min  Charges:  $Therapeutic Activity: 8-22 mins                     Destanee Bedonie A. Gilford Rile PT, DPT Acute Rehabilitation Services Pager [phone] Office [phone]    Linna Hoff 01/11/2021, 5:07 PM

## 2021-01-12 ENCOUNTER — Inpatient Hospital Stay (HOSPITAL_COMMUNITY): Payer: Medicaid Other

## 2021-01-12 DIAGNOSIS — I10 Essential (primary) hypertension: Secondary | ICD-10-CM

## 2021-01-12 DIAGNOSIS — I613 Nontraumatic intracerebral hemorrhage in brain stem: Secondary | ICD-10-CM | POA: Diagnosis not present

## 2021-01-12 DIAGNOSIS — J9601 Acute respiratory failure with hypoxia: Secondary | ICD-10-CM | POA: Diagnosis not present

## 2021-01-12 LAB — MAGNESIUM: Magnesium: 1.8 mg/dL (ref 1.7–2.4)

## 2021-01-12 LAB — CBC WITH DIFFERENTIAL/PLATELET
Abs Immature Granulocytes: 0.2 10*3/uL — ABNORMAL HIGH (ref 0.00–0.07)
Basophils Absolute: 0 10*3/uL (ref 0.0–0.1)
Basophils Relative: 0 %
Eosinophils Absolute: 0.3 10*3/uL (ref 0.0–0.5)
Eosinophils Relative: 3 %
HCT: 27.2 % — ABNORMAL LOW (ref 39.0–52.0)
Hemoglobin: 8.2 g/dL — ABNORMAL LOW (ref 13.0–17.0)
Immature Granulocytes: 2 %
Lymphocytes Relative: 10 %
Lymphs Abs: 0.9 10*3/uL (ref 0.7–4.0)
MCH: 29 pg (ref 26.0–34.0)
MCHC: 30.1 g/dL (ref 30.0–36.0)
MCV: 96.1 fL (ref 80.0–100.0)
Monocytes Absolute: 1.1 10*3/uL — ABNORMAL HIGH (ref 0.1–1.0)
Monocytes Relative: 12 %
Neutro Abs: 7 10*3/uL (ref 1.7–7.7)
Neutrophils Relative %: 73 %
Platelets: 148 10*3/uL — ABNORMAL LOW (ref 150–400)
RBC: 2.83 MIL/uL — ABNORMAL LOW (ref 4.22–5.81)
RDW: 14.9 % (ref 11.5–15.5)
WBC: 9.6 10*3/uL (ref 4.0–10.5)
nRBC: 0 % (ref 0.0–0.2)

## 2021-01-12 LAB — GLUCOSE, CAPILLARY
Glucose-Capillary: 117 mg/dL — ABNORMAL HIGH (ref 70–99)
Glucose-Capillary: 116 mg/dL — ABNORMAL HIGH (ref 70–99)
Glucose-Capillary: 117 mg/dL — ABNORMAL HIGH (ref 70–99)
Glucose-Capillary: 120 mg/dL — ABNORMAL HIGH (ref 70–99)
Glucose-Capillary: 121 mg/dL — ABNORMAL HIGH (ref 70–99)
Glucose-Capillary: 127 mg/dL — ABNORMAL HIGH (ref 70–99)

## 2021-01-12 LAB — BASIC METABOLIC PANEL
Anion gap: 12 (ref 5–15)
BUN: 53 mg/dL — ABNORMAL HIGH (ref 6–20)
CO2: 15 mmol/L — ABNORMAL LOW (ref 22–32)
Calcium: 9.1 mg/dL (ref 8.9–10.3)
Chloride: 117 mmol/L — ABNORMAL HIGH (ref 98–111)
Creatinine, Ser: 6.16 mg/dL — ABNORMAL HIGH (ref 0.61–1.24)
GFR, Estimated: 10 mL/min — ABNORMAL LOW (ref >60)
Glucose, Bld: 128 mg/dL — ABNORMAL HIGH (ref 70–99)
Potassium: 4.2 mmol/L (ref 3.5–5.1)
Sodium: 144 mmol/L (ref 135–145)

## 2021-01-12 LAB — POTASSIUM: Potassium: 4.7 mmol/L (ref 3.5–5.1)

## 2021-01-12 IMAGING — DX DG CHEST 1V
1 series · 1 of 1 positions shown · non-contrast
Comparison: Portable exam [T3] hours compared to [DATE]

CLINICAL DATA: Intubation, stroke

EXAM:
CHEST  1 VIEW

[chest ap]
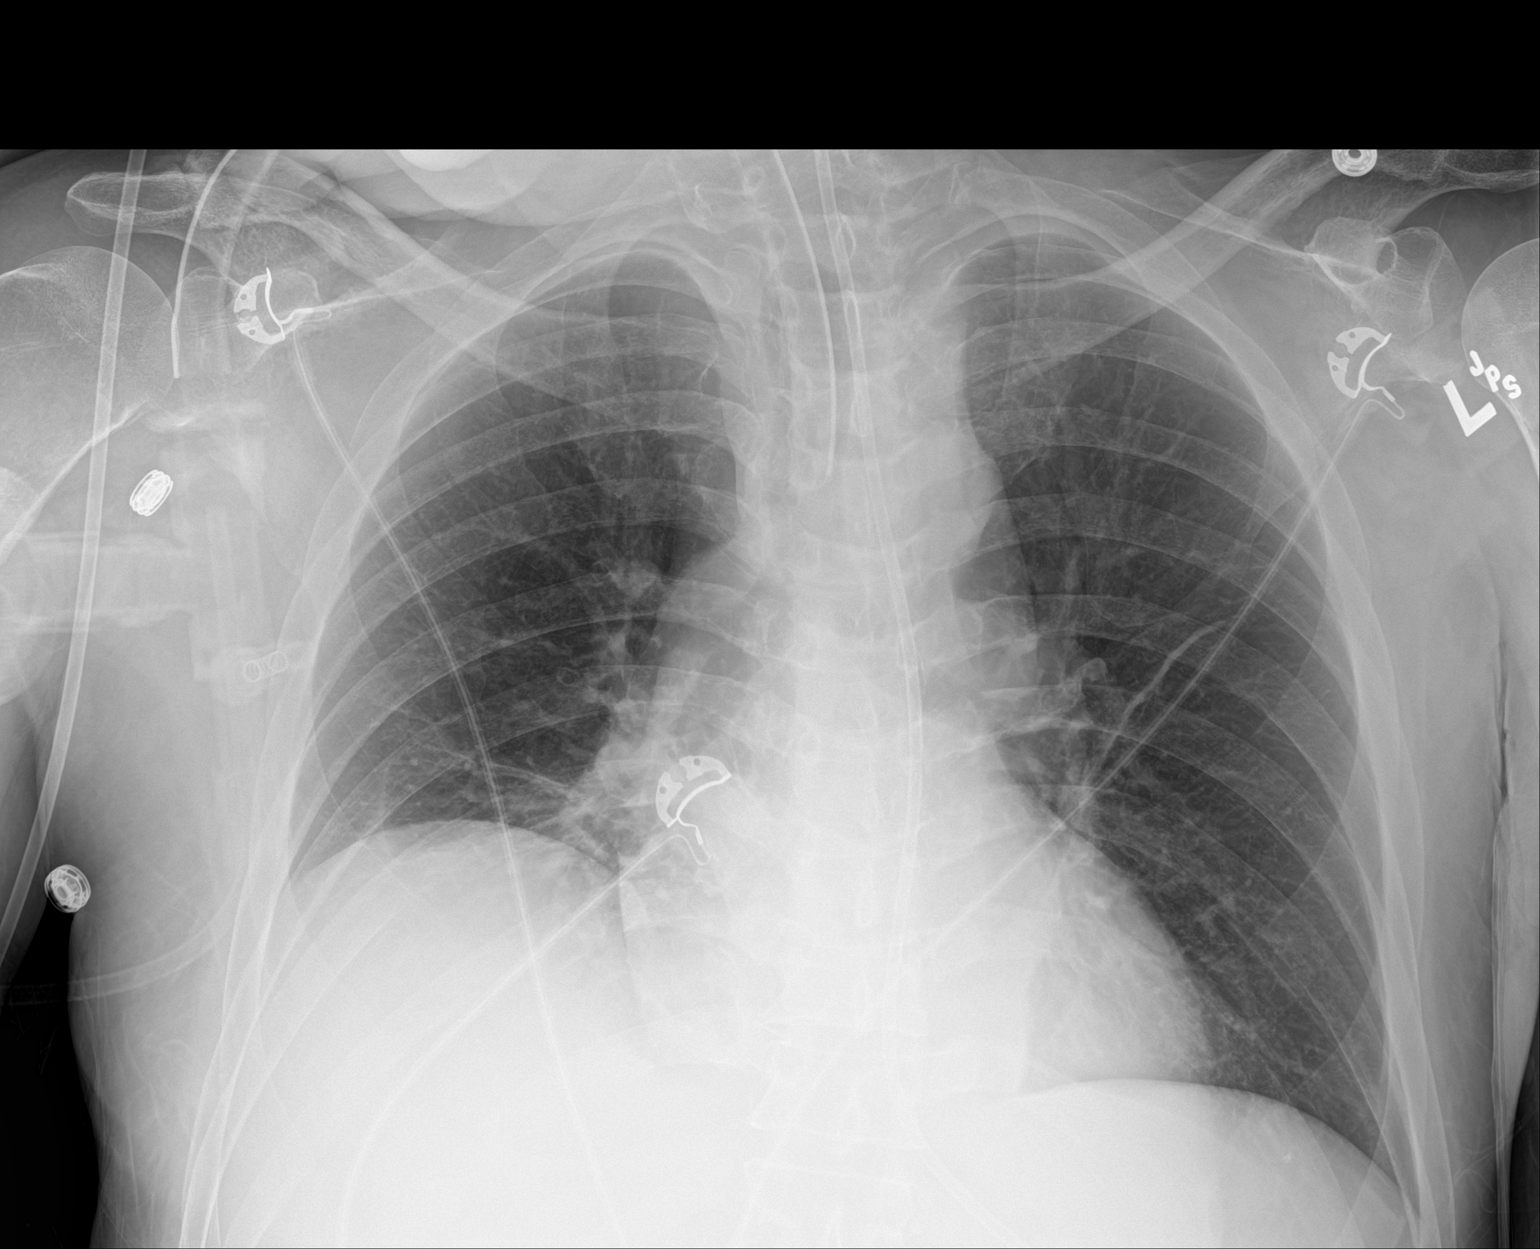

[1 of 1 positions shown; findings below may reference images not displayed]

FINDINGS: Tip of endotracheal tube projects 3.0 cm above carina.

Feeding tube extends into abdomen.

Normal heart size mediastinal contours.

Persistent RIGHT basilar atelectasis with improved aeration at LEFT
base since previous exam.

No new infiltrate, pleural effusion, or pneumothorax.

Bones demineralized.
IMPRESSION: Persistent mild RIGHT basilar atelectasis.

Improved aeration at LEFT lung base since previous exam.

## 2021-01-12 MED ORDER — MAGNESIUM SULFATE 2 GM/50ML IV SOLN
2.0000 g | Freq: Once | INTRAVENOUS | Status: AC
  Administered 2021-01-12: 2 g via INTRAVENOUS
  Filled 2021-01-12: qty 50

## 2021-01-12 MED ORDER — FUROSEMIDE 10 MG/ML IJ SOLN
80.0000 mg | Freq: Once | INTRAMUSCULAR | Status: AC
  Administered 2021-01-12: 80 mg via INTRAVENOUS
  Filled 2021-01-12: qty 8

## 2021-01-12 MED ORDER — POLYVINYL ALCOHOL 1.4 % OP SOLN
1.0000 [drp] | OPHTHALMIC | Status: DC | PRN
  Administered 2021-01-12 – 2021-01-15 (×7): 2 [drp] via OPHTHALMIC
  Filled 2021-01-12: qty 15

## 2021-01-12 MED ORDER — ALBUMIN HUMAN 25 % IV SOLN
12.5000 g | Freq: Once | INTRAVENOUS | Status: AC
  Administered 2021-01-12: 12.5 g via INTRAVENOUS
  Filled 2021-01-12: qty 50

## 2021-01-12 MED ORDER — CARVEDILOL 12.5 MG PO TABS
25.0000 mg | ORAL_TABLET | Freq: Two times a day (BID) | ORAL | Status: DC
  Administered 2021-01-12 – 2021-01-22 (×18): 25 mg
  Filled 2021-01-12 (×18): qty 2

## 2021-01-12 MED ORDER — FUROSEMIDE 10 MG/ML IJ SOLN: 80.0000 mg | Freq: Three times a day (TID) | INTRAMUSCULAR | Status: DC

## 2021-01-12 MED ORDER — FUROSEMIDE 10 MG/ML IJ SOLN
120.0000 mg | Freq: Four times a day (QID) | INTRAVENOUS | Status: DC
  Filled 2021-01-12 (×2): qty 12

## 2021-01-12 NOTE — Progress Notes (Signed)
Patient ID: Timothy Good, male   DOB: 12/27/1965, 55 y.o.   MRN: [id number] [facility] Progress Note   Assessment/ Plan:   1.  Acute kidney injury on chronic kidney disease stage III-IV: Underlying chronic kidney disease from hypertension (followed by Dr. Bhutani in [place]) and his acute kidney injury appears to be from ischemic ATN.  Unimpressive response to furosemide yesterday-we will redose this again today (along with albumin) and continue to follow labs; slight improvement of creatinine noted without any acute indications for dialysis at this time. 2.  Hypokalemia: Likely total body deficit with chronic alcohol use, level appears normal on morning labs today status post total replacement. 3.  Hypertension: Blood pressures currently well controlled on a combination of amlodipine, carvedilol and hydralazine.  Continue to hold ACE inhibitor and monitor with diuretic trial. 4.  Acute pontine intraparenchymal hemorrhage with associated edema: Ongoing blood pressure control with earlier hypertonic saline administration to help mitigate cerebral edema. 5.  Hypernatremia: Medically induced/iatrogenic for management of cerebral edema, will continue to follow with diuresis and adjust free water. 6.  Acute hypoxic respiratory failure: Associated with CVA and on Unasyn for possible aspiration pneumonia  Subjective:   Without acute events overnight.   Objective:   BP (!) 154/96 (BP Location: Left Arm)   Pulse (!) 112   Temp 99.4 F (37.4 C) (Axillary)   Resp (!) 26   Ht 6' 2" (1.88 m)   Wt 76 kg   SpO2 100%   BMI 21.51 kg/m   Intake/Output Summary (Last 24 hours) at 01/12/2021 0923 Last data filed at 01/12/2021 0800 Gross per 24 hour  Intake 2167.96 ml  Output 1550 ml  Net 617.96 ml   Weight change:   Physical Exam: Gen: Intubated, opens his eyes but with minimal interaction CVS: Pulse regular tachycardia, S1 and S2 normal without murmur/gallop Resp: Clear to  auscultation bilaterally, no distinct rales or rhonchi.  Large clean/dry dressing over left IJ Abd: Soft, obese, nontender, bowel sounds normal.  NGT in situ Ext: Trace lower extremity edema, 1-2+ upper extremity edema  Imaging: DG Abd Portable 1V  Result Date: 01/11/2021 CLINICAL DATA:  Nasogastric tube placement EXAM: PORTABLE ABDOMEN - 1 VIEW COMPARISON:  None. FINDINGS: Enteric feeding tube is positioned below the diaphragm with tip in the vicinity of the pylorus. Nonobstructive pattern of included bowel gas. IMPRESSION: Enteric feeding tube is positioned below the diaphragm with tip in the vicinity of the pylorus. Consider advancement if post pyloric positioning is desired. Electronically Signed   By: Alex D Bibbey M.D.   On: 01/11/2021 13:21    Labs: BMET Recent Labs  Lab 01/30/2021 1308 01/08/2021 1321 01/12/2021 1506 01/07/21 0300 01/07/21 1037 01/08/21 0000 01/08/21 0816 01/08/21 1800 01/09/21 0139 01/09/21 0630 01/09/21 1641 01/10/21 0024 01/10/21 0323 01/10/21 1859 01/11/21 0224 01/12/21 0024  NA 141 142   < > 149*   < > 159*   < > 161* 159* 158*  --  151* 152*  --  143 144  K 3.8 3.3*   < > 2.8*  --  3.6  --   --  3.6  --   --  3.4* 3.2*  --  3.5 4.2  CL 105 108  --  118*  --  >130*  --   --  >130*  --   --  125*  --   --  116* 117*  CO2 23  --   --  21*  --  17*  --   --    16*  --   --  16*  --   --  15* 15*  GLUCOSE 136* 118*  --  113*  --  116*  --   --  80  --   --  106*  --   --  116* 128*  BUN 34* 38*  --  27*  --  27*  --   --  35*  --   --  47*  --   --  50* 53*  CREATININE 3.65* 3.50*  --  3.59*  --  3.80*  --   --  5.20*  --   --  6.58*  --   --  6.27* 6.16*  CALCIUM 9.1  --   --  8.9  --  9.0  --   --  8.8*  --   --  9.0  --   --  8.8* 9.1  PHOS  --   --   --  4.1  --   --   --   --   --  6.1* 3.6 5.4*  --  4.3  --   --    < > = values in this interval not displayed.   CBC Recent Labs  Lab 01/09/2021 1308 01/08/2021 1321 01/21/2021 1700 01/07/21 0300  01/08/21 0000 01/10/21 0323  WBC 10.4  --   --  8.8 10.6*  --   NEUTROABS 9.4*  --   --   --   --   --   HGB 10.9*   < > 9.5* 9.0* 8.6* 8.2*  HCT 33.4*   < > 28.0* 29.4* 28.8* 24.0*  MCV 94.4  --   --  96.7 100.3*  --   PLT 251  --   --  189 157  --    < > = values in this interval not displayed.   Medications:     amLODipine  10 mg Per Tube Daily   carvedilol  12.5 mg Per Tube BID WC   chlorhexidine gluconate (MEDLINE KIT)  15 mL Mouth Rinse BID   Chlorhexidine Gluconate Cloth  6 each Topical Daily   feeding supplement (PROSource TF)  45 mL Per Tube BID   folic acid  1 mg Per Tube Daily   free water  200 mL Per Tube Q6H   hydrALAZINE  100 mg Per Tube Q8H   insulin aspart  0-6 Units Subcutaneous Q4H   mouth rinse  15 mL Mouth Rinse 10 times per day   multivitamin with minerals  1 tablet Per Tube Daily   pantoprazole sodium  40 mg Per Tube Daily   thiamine  100 mg Per Tube Daily   Elmarie Shiley, MD 01/12/2021, 9:23 AM

## 2021-01-12 NOTE — Progress Notes (Addendum)
NAME:  Timothy Good, MRN:  [id number], DOB:  May 26, 1965, LOS: 6 ADMISSION DATE:  01/09/2021, CONSULTATION DATE:  01/28/2021 REFERRING MD:  Curly [place], CHIEF COMPLAINT:  found down   History of Present Illness:  55 y/o male with multiple medical problems was found down by in a well call visit by police and was brought to [facility] with a diagnosis of pontine hemorrhage.  He was intubated for airway protection and then transferred to [facility] for further evaluation.  Pulmonary and critical care medicine was consulted for medical management in the setting of acute hemorrhagic stroke.  The patient was unable to provide history because he was intubated.  Significant Hospital Events: Including procedures, antibiotic start and stop dates in addition to other pertinent events   January 06, 2021 admission, intubation 10/13: remains intubated  Interim History / Subjective:  Patient remains on mechanical ventilation; on PS wean trial; increased amount of secretions from previous days; weak cough; hco3 15  Neuro exam similar to yesterday; on no sedation; L hemiparesis; RLE moves and follows commands; RUE weak movement UOP -1.5 L last 24 hours; On lasix 120 IV q6 Creat 6.16 from 6.27 yesterday SBP range 125-154 Fever of 100.6 overnight   Objective   Blood pressure (!) 154/96, pulse (!) 112, temperature 99.4 F (37.4 C), temperature source Axillary, resp. rate (!) 26, height '6\' 2"'$  (1.88 m), weight 76 kg, SpO2 100 %.    Vent Mode: PRVC FiO2 (%):  [40 %] 40 % Set Rate:  [12 bmp] 12 bmp Vt Set:  [490 mL] 490 mL PEEP:  [5 cmH20] 5 cmH20 Pressure Support:  [5 cmH20] 5 cmH20 Plateau Pressure:  [12 cmH20-15 cmH20] 13 cmH20   Intake/Output Summary (Last 24 hours) at 01/12/2021 [id number] Last data filed at 01/12/2021 0600 Gross per 24 hour  Intake 1707.96 ml  Output 1550 ml  Net 157.96 ml    Filed Weights   01/07/2021 1647 01/10/21 0500 01/12/21 0439  Weight: 70.7 kg 76.1 kg 76 kg     Examination: General:  critically ill appearing on mech vent HEENT: MM pink/moist; eyes dry w/ mild proptosis; ETT in place Neuro: L hemiparesis; RLE movement stronger than RUE to commands; opens eyes to command CV: s1s2, sinus tach, no m/r/g PULM:  dim clear BS bilaterally; on mech vent PSV GI: soft, bsx4 active  Extremities: warm/dry, no edema  Skin: no rashes or lesions    Resolved Hospital Problem list     Assessment & Plan:   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 uncontrolled hypertension Cerebral edema Acute encephalopathy due to hemorrhagic stroke Induced hypernatremia P: -frequent neuro checks; limit sedating medications -continue amlodipine, hydralazine -increased dose of coreg -continue lasix 80 mg TID -continue to let Na trend down; free water remains at 200 q6 -prn tylenol for fever  Acute respiratory failure with hypoxemia acute hemorrhagic pontine stroke Probable aspiration pneumonia P:  -continue mechanical ventilation PRVC 6-8 cc/kg -wean fio2 for sats >92% -VAP prevention in place -continue Ceftriaxone; follow cultures -Daily SBT/SAT -CPT TID -CXR am -Abg/CXR as needed  Acute kidney injury on Chronic kidney disease stage IV:  Tried hydration 10/10. Nephro consult 10/11. Hypokalemia: improved P: -Creat slowly improving -continue lasix 80 scheduled -Trend BMP / urinary output -Replace electrolytes as indicated -Avoid nephrotoxic agents, ensure adequate renal perfusion  Uncontrolled hypertension P: -continue BP as above  Alcohol dependence P: -continue thiamine/folate -monitor for signs of withdrawal  Probable hyperthyroidism Mild proptosis P: -eye  drops prn -continue beta blocker -consider adding methimazole   Best Practice (right click and "Reselect all SmartList Selections" daily)   Diet/type: tubefeeds DVT prophylaxis: SCD GI prophylaxis: H2B Lines: N/A Foley:  Yes, and it is still  needed Code Status:  full code Last date of multidisciplinary goals of care discussion [per primary]    CRITICAL CARE Performed by: Mick Sell   Total critical care time: 35 minutes  JD Rexene Agent [facility] 01/12/2021, 9:50 AM  Please see [email or web address] for pager details.  From 7A-7P if no response, please call [phone]. After hours, please call ELink [phone].

## 2021-01-12 NOTE — TOC CAGE-AID Note (Signed)
Transition of Care [facility]) - CAGE-AID Screening   Patient Details  Name: Timothy Good MRN: [id number] Date of Birth: [birth year]  Transition of Care [facility]) CM/SW Contact:    Aubrie Lucien C Tarpley-Carter, [place] Phone Number: 01/12/2021, 3:05 PM   Clinical Narrative: Pt is unable to participate in Cage Aid.  Pt is intubated.  Gedalya Jim Tarpley-Carter, MSW, LCSW-A Pronouns:  She/Her/Hers [facility] HealthTransitions of Care Clinical Social Worker Direct Number:  [phone] [email or web address]  CAGE-AID Screening: Substance Abuse Screening unable to be completed due to: : Patient unable to participate             Substance Abuse Education Offered: No

## 2021-01-12 NOTE — Progress Notes (Signed)
Nutrition Follow-up  DOCUMENTATION CODES:   Non-severe (moderate) malnutrition in context of chronic illness  INTERVENTION:   Tube feeding via Cortrak tube: Vital 1.5 @ 60 ml/hr (1440 ml per day) Prosource TF 45 ml BID  Provides 2240 kcal, 119 gm protein, 1167 ml free water daily  200 ml free water every 6 hours  Total free water: 1967 ml   NUTRITION DIAGNOSIS:   Moderate Malnutrition related to chronic illness as evidenced by moderate fat depletion, moderate muscle depletion, severe muscle depletion. Ongoing.   GOAL:   Patient will meet greater than or equal to 90% of their needs Met with TF.   MONITOR:   TF tolerance  REASON FOR ASSESSMENT:   Consult Enteral/tube feeding initiation and management  ASSESSMENT:   Pt with PMH of uncontrolled HTN, CKD stage IV, CHF, HLD, daily ETOH, medication non-adherence, iron deficiency anemia admitted after being found down at home by poilce during a well check. Admitted with a dx of pontine hemorrhage.   Pt discussed during ICU rounds and with RN. Per RN pt continues to follow commands, increased secretions. L hemiparesis. Renal following for AKI, started on albumin and lasix.    10/12 cortrak placed; tip gastric   Patient is currently intubated on ventilator support MV: 14.4 L/min Temp (24hrs), Avg:99.9 F (37.7 C), Min:99.3 F (37.4 C), Max:100.7 F (38.2 C)   Medications reviewed and include: folic acid, lasix, SSI, MVI with minerals, protonix, thiamine Albumin Lasix Mag sulfate x 1  Labs reviewed: Magnesium 1.8 CBG's: 117-120   UOP: 750 ml  I&O: +10 L  Diet Order:   Diet Order             Diet NPO time specified  Diet effective now                   EDUCATION NEEDS:   Not appropriate for education at this time  Skin:  Skin Assessment: Reviewed RN Assessment  Last BM:  90 ml via rectal pouch  Height:   Ht Readings from Last 1 Encounters:  01/10/21 _0  (1.88 m)    Weight:   Wt  Readings from Last 1 Encounters:  01/12/21 76 kg    BMI:  Body mass index is 21.51 kg/m.  Estimated Nutritional Needs:   Kcal:  2000-2200  Protein:  105-120 grams  Fluid:  > 2 L/day  Lockie Pares., RD, LDN, CNSC See AMiON for contact information

## 2021-01-12 NOTE — Progress Notes (Addendum)
STROKE TEAM PROGRESS NOTE   INTERVAL HISTORY Today he remains intubated for respiratory failure on precedex sedation.  Tmax 100.1, BP adequately controlled no further episodes of bradycardia  or hypothermia.  Able he remains alert, following commands. Creatinine 3.8->5.2 on IVF and free water. He is on Rocephin for pneumonia. We explained his plan of care. He was attentive to examiners.   Patient appears more encephalopathic at time of this exam; appears less engaged, less briskly reactive than prior exam.   Patient now on lasix around the clock. Nephrology following.   No one is at the  bedside today at time of this exam. No plans for trach at this time.   Patient appears more encephalopathic than prior evaluation. Will obtain updated labs, and chest xray to assess.  CCM team want to diurese him more as he had a unimpressive response to Lasix yesterday.  Nephrology feels there is no acute indication for dialysis at the present time.    Vitals:   01/12/21 0600 01/12/21 0700 01/12/21 0800 01/12/21 0832  BP: 140/89 (!) 141/83 (!) 154/96   Pulse: (!) 105 (!) 108 (!) 112   Resp: 19 20 (!) 26   Temp:   99.4 F (37.4 C)   TempSrc:   Axillary   SpO2: 98% 96% 100% 100%  Weight:      Height:       CBC:  Recent Labs  Lab 01/10/2021 1308 01/25/2021 1321 01/07/21 0300 01/08/21 0000 01/10/21 0323  WBC 10.4  --  8.8 10.6*  --   NEUTROABS 9.4*  --   --   --   --   HGB 10.9*   < > 9.0* 8.6* 8.2*  HCT 33.4*   < > 29.4* 28.8* 24.0*  MCV 94.4  --  96.7 100.3*  --   PLT 251  --  189 157  --    < > = values in this interval not displayed.    Basic Metabolic Panel:  Recent Labs  Lab 01/10/21 0024 01/10/21 0323 01/10/21 1859 01/11/21 0224 01/12/21 0024  NA 151*   < >  --  143 144  K 3.4*   < >  --  3.5 4.2  CL 125*  --   --  116* 117*  CO2 16*  --   --  15* 15*  GLUCOSE 106*  --   --  116* 128*  BUN 47*  --   --  50* 53*  CREATININE 6.58*  --   --  6.27* 6.16*  CALCIUM 9.0  --   --   8.8* 9.1  MG 2.2  --  1.8  --   --   PHOS 5.4*  --  4.3  --   --    < > = values in this interval not displayed.    Lipid Panel:  Recent Labs  Lab 01/08/21 0000 01/08/21 0817  CHOL 184  --   TRIG 71 76  HDL 95  --   CHOLHDL 1.9  --   VLDL 14  --   LDLCALC 75  --     HgbA1c:  Recent Labs  Lab 01/07/21 1037  HGBA1C 5.4    Urine Drug Screen:  Recent Labs  Lab 01/03/2021 1310  LABOPIA NONE DETECTED  COCAINSCRNUR NONE DETECTED  LABBENZ NONE DETECTED  AMPHETMU NONE DETECTED  THCU NONE DETECTED  LABBARB NONE DETECTED     Alcohol Level  Recent Labs  Lab 01/15/2021 1411  ETH <10  IMAGING past 24 hours DG Abd Portable 1V  Result Date: 01/11/2021 CLINICAL DATA:  Nasogastric tube placement EXAM: PORTABLE ABDOMEN - 1 VIEW COMPARISON:  None. FINDINGS: Enteric feeding tube is positioned below the diaphragm with tip in the vicinity of the pylorus. Nonobstructive pattern of included bowel gas. IMPRESSION: Enteric feeding tube is positioned below the diaphragm with tip in the vicinity of the pylorus. Consider advancement if post pyloric positioning is desired. Electronically Signed   By: Delanna Ahmadi M.D.   On: 01/11/2021 13:21    PHYSICAL EXAM Physical Exam  Constitutional: Appears thin, chronically ill who is intubated and sedated Eyes: No scleral injection. Proptotic, which is new per family, right worse than left.  Right eye also has some exudate/clouding and is chemotic HENT: No oropharyngeal obstruction.  MSK: no joint deformities.  Cardiovascular: Tachycardia 100s, SR on cardiac monitor  Respiratory: Intubated, not labored,symmetric rise  Skin: Warm dry and intact visible skin   Neuro: Mental Status: Patient is  Awake, intubated, off sedation.  Appears drowsy but easily arousable following simple but not complex commands. Eyes open.  Cranial Nerves: II: Pupils are equal, round, and reactive to light.  2 mm -> 1 mm reactive, III,IV, VI: EOM w/ occular bobbing,  mildly dysconjugate gaze V: Facial sensation corneals bilaterally intact VII: Facial movement is notable for upper face more reactive on the left than the right, lower face limited eval due to ETT VIII: hearing is intact to voice X/XI: Gag present  Motor: Antigravity on LUE, RUE weakly following commands with the hand, Antigravity LLE  and 2/5 RLE. Patient has right sided asterixis to exam today; this is new   Sensory: Less reactive on the left than the right Cerebellar: UTA     ASSESSMENT/PLAN [place] is a 55 y.o. male with a past medical history significant for uncontrolled hypertension complicated by CKD stage IV, heart failure, hyperlipidemia, daily drinking, medication nonadherence, iron deficiency anemia/anemia of chronic disease, presenting with a pontine hemorrhage. ICH Score: 3. NIHSS 24. Family reported he had some difficulty swallowing for a few days and had been complaining of GI discomfort.   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   Code Stroke  1. Examination is positive for hemorrhagic infarct within the brainstem. 2. Posterior extension of clot into the fourth ventricle and the cisterna magna. 3. Chronic small vessel ischemic disease and brain atrophy. MRI   1. Acute brainstem hemorrhage centered at the pons with estimated intra-axial blood volume of 10 mL. Extensive pontine edema, which tracks associated edema which tracks into the midbrain right > left and into the right medullary pyramid. 2. Mild extension of hemorrhage into the 4th ventricle and basilar subarachnoid spaces. But no ventriculomegaly. No midline shift or impending herniation. 3. Underlying advanced chronic small vessel disease including multiple chronic microhemorrhages in the cerebellum and bilateral deep gray matter nuclei. Advanced bilateral cerebral white matter ischemia. 4. Negative noncontrast MRI appearance of the  orbits. MR Orbits Negative noncontrast MRI appearance of the orbits.  2D Echo  1. Left ventricular ejection fraction, by estimation, is 55 to 60%. The  left ventricle has normal function. The left ventricle demonstrates  regional wall motion abnormalities. Basal inferior akinesis.There is moderate asymmetric left ventricular  hypertrophy of the basal-septal segment. Left ventricular diastolic parameters are consistent with Grade I diastolic dysfunction (impaired relaxation).   2. Right ventricular systolic function is normal. The right ventricular  size is normal. Tricuspid regurgitation signal is  inadequate for assessing PA pressure.   3. The mitral valve is normal in structure. No evidence of mitral valve regurgitation.   4. The aortic valve was not well visualized. Aortic valve regurgitation is mild. No aortic stenosis is present.   5. The inferior vena cava is normal in size with greater than 50%  Resp   CT Chest Abd Pelvis 1. Suggestion of generalized gastric wall thickening, poorly assessed on this scan due to gastric decompression by enteric tube, with associated fat stranding in the gastrohepatic ligament. Findings are nonspecific and could be due to noninflammatory edema,gastritis, peptic ulcer disease or neoplasm. Consider upper endoscopic correlation as clinically warranted. 2. Small volume ascites, predominantly perihepatic. 3. Prominently distended gallbladder. No radiopaque cholelithiasis. No gallbladder wall thickening. No pericholecystic fluid. No biliary ductal dilatation. 4. Moderate diffuse colonic diverticulosis. 5. Mild to moderate dependent bibasilar atelectasis. 6. Coronary atherosclerosis. 7. Chronic appearing mild T10 vertebral compression fracture. 8. Aortic Atherosclerosis (ICD10-I70.0).  LDL 75 HgbA1c 5.4 VTE prophylaxis - SCDs, lovenox     Diet   Diet NPO time specified   Not on Anticoagulant or antiplatelet prior to admission Not taking Rx  medications prior to admission Hold anticoagulant/antiplatelet medications in the setting of hemorrhage Therapy recommendations:  pending patient status  Disposition:  TBD       Cerebral Edema Neurosurgery consulted, no acute intervention recommended at this time. Hypertonic saline infusion has been discontinued, allow sodium to trend down with daily check   Na 149->147->153->158->159->161->159       Acute respiratory failure      Probable aspiration pneumonia Remains intubated, appreciate CCM management First on Unasyn, now on Rocephin Resp culture: few gram positive cocci Precedex off     Hypertension Home meds: not taking any prescribed meds >1 year  Unstable Keep systolic BP less than [id number]  Long-term BP goal normotensive  Hyperlipidemia Home meds:  None LDL  75, almost at goal < 70 High intensity statin to be addressed at discharge in setting of pontine hemorrhage Continue statin at discharge       Glycemic management No hx of DM2  HgbA1c  5.4, at goal < 7.0 CBGs Recent Labs    01/11/21 2334 01/12/21 0339 01/12/21 0758  GLUCAP 120* 117* 116*    SSI       AKI on CKD IV Creatinine 3.65->3.80->-5.2 LR x 8 hours  and free water  ?urinary retention vs. oliguria, ?need to replace foley CCM on board         Hypokalemia Monitor and replete as needed 3.9->2.8->3.6-       ETOH use disorder with dependence Daily use of excessive liquor per family (?1/2 bottle vodka)  Thiamine, folate and MVI on board Monitor magnesium, 2.2->2.2->2.1 Vitamin B12 -213 Monitor s/s of withdrawal, need for CIWA        Feeding      Dysphagia Continue tube feeding and free water        Abd discomfort on presentation CT, Chest Abd Pelvis: generalized gastric wall thickening, with associated fat stranding in the gastrohepatic ligament.Findings are nonspecific and could be due to noninflammatory edema,gastritis, peptic ulcer disease or neoplasm.  CCM, Dr. Valeta Harms: nothing acutely  concerning   Other Stroke Risk Factors Current Cigarette smoker, will be advised to stop smoking ETOH use disorder alcohol level <10, advised to drink no more than 2 drink(s) a day  Other Active Problems ? 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 Agree with note above.  Patient neurological exam appears to have declined slightly but this is likely due to encephalopathy due to renal failure.. .  Appreciate nephrology help.  Wean off ventilatory support as per CCM team.  Discussed with Dr. Silas Flood and Dr. Posey Pronto.  No family available at bedside for discussion today   .  This patient is critically ill and at significant risk of neurological worsening, death and care requires constant monitoring of vital signs, hemodynamics,respiratory and cardiac monitoring, extensive review of multiple databases, frequent neurological assessment, discussion with family, other specialists and medical decision making of high complexity.I have made any additions or clarifications directly to the above note.This critical care time does not reflect procedure time, or teaching time or supervisory time of PA/NP/Med Resident etc but could involve care discussion time.  I spent 30 minutes of neurocritical care time  in the care of  this patient. Antony Contras MD    To contact Stroke Continuity provider, please refer to [email or web address] After hours, contact General Neurology

## 2021-01-13 ENCOUNTER — Inpatient Hospital Stay (HOSPITAL_COMMUNITY): Payer: Medicaid Other

## 2021-01-13 DIAGNOSIS — N179 Acute kidney failure, unspecified: Secondary | ICD-10-CM

## 2021-01-13 DIAGNOSIS — J9601 Acute respiratory failure with hypoxia: Secondary | ICD-10-CM | POA: Diagnosis not present

## 2021-01-13 DIAGNOSIS — I613 Nontraumatic intracerebral hemorrhage in brain stem: Secondary | ICD-10-CM | POA: Diagnosis not present

## 2021-01-13 DIAGNOSIS — Z978 Presence of other specified devices: Secondary | ICD-10-CM | POA: Diagnosis not present

## 2021-01-13 DIAGNOSIS — I10 Essential (primary) hypertension: Secondary | ICD-10-CM | POA: Diagnosis not present

## 2021-01-13 LAB — BASIC METABOLIC PANEL
Anion gap: 11 (ref 5–15)
BUN: 60 mg/dL — ABNORMAL HIGH (ref 6–20)
CO2: 18 mmol/L — ABNORMAL LOW (ref 22–32)
Calcium: 9.9 mg/dL (ref 8.9–10.3)
Chloride: 115 mmol/L — ABNORMAL HIGH (ref 98–111)
Creatinine, Ser: 6.09 mg/dL — ABNORMAL HIGH (ref 0.61–1.24)
GFR, Estimated: 10 mL/min — ABNORMAL LOW (ref >60)
Glucose, Bld: 122 mg/dL — ABNORMAL HIGH (ref 70–99)
Potassium: 4.5 mmol/L (ref 3.5–5.1)
Sodium: 144 mmol/L (ref 135–145)

## 2021-01-13 LAB — CBC
HCT: 25 % — ABNORMAL LOW (ref 39.0–52.0)
Hemoglobin: 7.6 g/dL — ABNORMAL LOW (ref 13.0–17.0)
MCH: 29.1 pg (ref 26.0–34.0)
MCHC: 30.4 g/dL (ref 30.0–36.0)
MCV: 95.8 fL (ref 80.0–100.0)
Platelets: 152 10*3/uL (ref 150–400)
RBC: 2.61 MIL/uL — ABNORMAL LOW (ref 4.22–5.81)
RDW: 15.1 % (ref 11.5–15.5)
WBC: 9.2 10*3/uL (ref 4.0–10.5)
nRBC: 0 % (ref 0.0–0.2)

## 2021-01-13 LAB — GLUCOSE, CAPILLARY
Glucose-Capillary: 107 mg/dL — ABNORMAL HIGH (ref 70–99)
Glucose-Capillary: 115 mg/dL — ABNORMAL HIGH (ref 70–99)
Glucose-Capillary: 122 mg/dL — ABNORMAL HIGH (ref 70–99)
Glucose-Capillary: 126 mg/dL — ABNORMAL HIGH (ref 70–99)
Glucose-Capillary: 127 mg/dL — ABNORMAL HIGH (ref 70–99)
Glucose-Capillary: 99 mg/dL (ref 70–99)

## 2021-01-13 IMAGING — DX DG CHEST 1V PORT
1 series · 1 of 1 positions shown · non-contrast
Comparison: [DATE].

CLINICAL DATA: Acute respiratory failure with hypoxia

EXAM:
PORTABLE CHEST 1 VIEW

[chest ap]
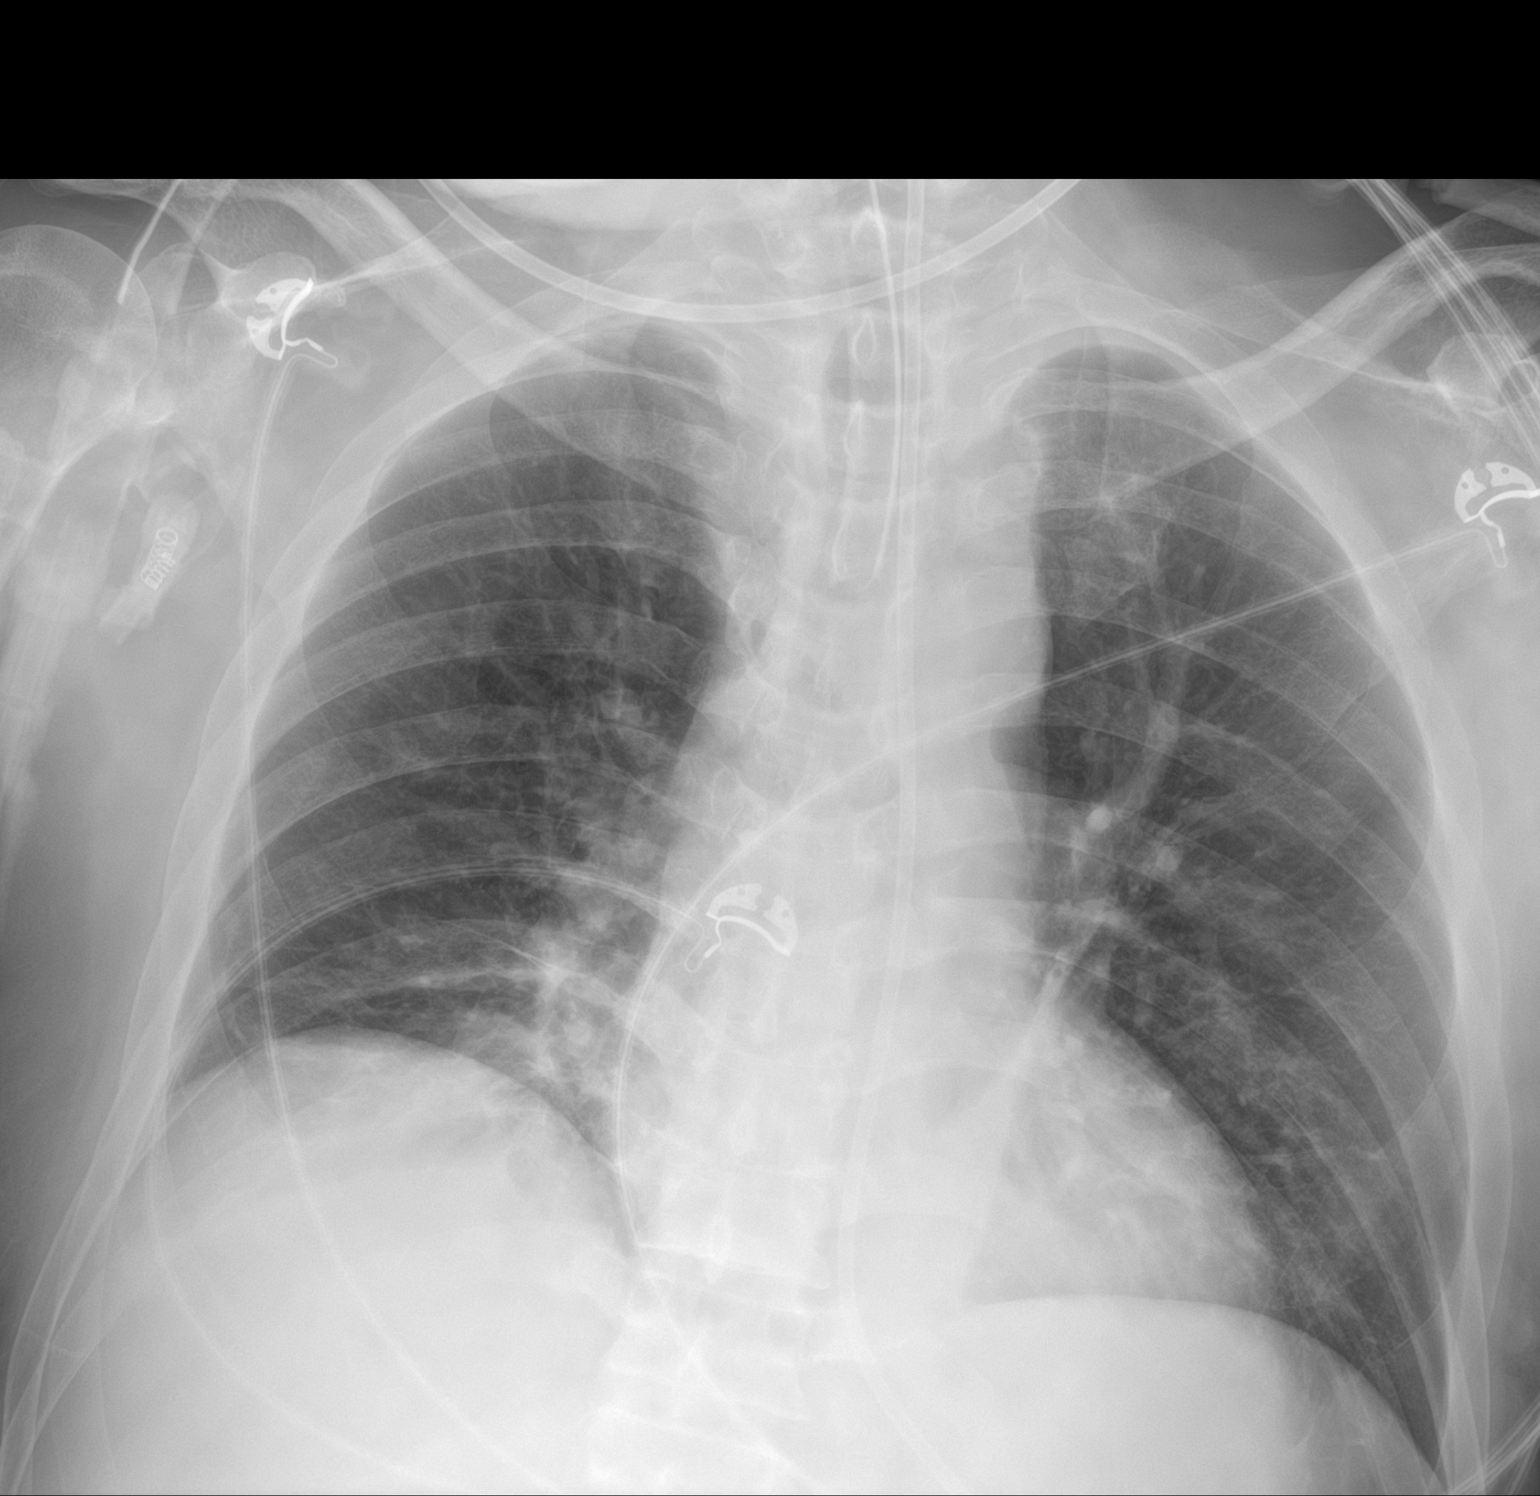

[1 of 1 positions shown; findings below may reference images not displayed]

FINDINGS: Endotracheal tube tip 3.8 cm above the carina. Enteric tube courses
below the diaphragm in outside the field of view. Similar streaky
right basilar opacities, compatible with atelectasis. No new
consolidation. No visible pleural effusions or pneumothorax. Similar
cardiomediastinal silhouette.
IMPRESSION: Similar right basilar atelectasis.  No new acute abnormality.

## 2021-01-13 NOTE — Progress Notes (Signed)
Physical Therapy Treatment Patient Details Name: Timothy Good MRN: [id number] DOB: May 23, 1965 Today's Date: 01/13/2021   History of Present Illness 55 y.o. male presents to [facility] hospital on 01/08/2021, found down during a well check. Pt was emergently intubated and head CT revealed pontine hemorrhage. PMH includes HTN, CKD stage IV, HLD, alcohol abuse, anemia.    PT Comments    Patient with intermittent times of lethargy and eyes closed, will open eyes on command but quickly return to closed position. Rolled L/R with totalA+2 for linen change. Placed patient in egress position to improve upright tolerance. Noted hyperreflexia to bilateral LE, notified RN. Positioned patient's head in midline with pillows/blankets due to patient's R gaze preference possibly due to vent placement. Continue to recommend comprehensive inpatient rehab (CIR) for post-acute therapy needs.     Recommendations for follow up therapy are one component of a multi-disciplinary discharge planning process, led by the attending physician.  Recommendations may be updated based on patient status, additional functional criteria and insurance authorization.  Follow Up Recommendations  CIR;Supervision/Assistance - 24 hour     Equipment Recommendations  Wheelchair (measurements PT);Wheelchair cushion (measurements PT);Hospital bed;Other (comment) (hoyer lift)    Recommendations for Other Services       Precautions / Restrictions Precautions Precautions: Fall Precaution Comments: intubated, cortrak Restrictions Weight Bearing Restrictions: No     Mobility  Bed Mobility Overal bed mobility: Needs Assistance Bed Mobility: Rolling Rolling: Total assist;+2 for physical assistance;+2 for safety/equipment         General bed mobility comments: totalA+2 to roll L/R for linen change. Placed in egree position to increase secretion production and alertness. At times, patient with eyes closed but opens on command.     Transfers                    Ambulation/Gait                 Stairs             Wheelchair Mobility    Modified Rankin (Stroke Patients Only) Modified Rankin (Stroke Patients Only) Pre-Morbid Rankin Score: No significant disability Modified Rankin: Severe disability     Balance                                            Cognition Arousal/Alertness: Awake/alert Behavior During Therapy: Flat affect Overall Cognitive Status: Difficult to assess                                 General Comments: pt follows one step commands well to move R side during session      Exercises      General Comments General comments (skin integrity, edema, etc.): Noted bilateral LE hyperreflexia, notified RN      Pertinent Vitals/Pain Pain Assessment: Faces Faces Pain Scale: No hurt Pain Intervention(s): Monitored during session    Home Living                      Prior Function            PT Goals (current goals can now be found in the care plan section) Acute Rehab PT Goals Patient Stated Goal: Pt unable to state 2/2 intubated, PT Goal Formulation: Patient unable to participate in goal setting Time  For Goal Achievement: 01/22/21 Potential to Achieve Goals: Fair Progress towards PT goals: Progressing toward goals    Frequency    Min 3X/week (until off vent)      PT Plan Current plan remains appropriate    Co-evaluation              AM-PAC PT "6 Clicks" Mobility   Outcome Measure  Help needed turning from your back to your side while in a flat bed without using bedrails?: Total Help needed moving from lying on your back to sitting on the side of a flat bed without using bedrails?: Total Help needed moving to and from a bed to a chair (including a wheelchair)?: Total Help needed standing up from a chair using your arms (e.g., wheelchair or bedside chair)?: Total Help needed to walk in hospital  room?: Total Help needed climbing 3-5 steps with a railing? : Total 6 Click Score: 6    End of Session Equipment Utilized During Treatment: Oxygen Activity Tolerance: Patient tolerated treatment well Patient left: in bed;with call bell/phone within reach Nurse Communication: Mobility status;Need for lift equipment PT Visit Diagnosis: Other abnormalities of gait and mobility (R26.89);Muscle weakness (generalized) (M62.81);Other symptoms and signs involving the nervous system (R29.898)     Time: 1437-1500 PT Time Calculation (min) (ACUTE ONLY): 23 min  Charges:  $Therapeutic Activity: 23-37 mins                     Laketha Leopard A. Gilford Rile PT, DPT Acute Rehabilitation Services Pager [phone] Office [phone]    Linna Hoff 01/13/2021, 5:08 PM

## 2021-01-13 NOTE — Progress Notes (Signed)
STROKE TEAM PROGRESS NOTE   INTERVAL HISTORY Today he remains intubated for respiratory failure on precedex sedation.  Afebrile, BP adequately controlled no further episodes of bradycardia  or hypothermia.  Able he remains alert, following commands. Creatinine 6.09.  He responded with good diuresis with albumin and Lasix yesterday.Marland Kitchen He is on Rocephin for pneumonia.  Patient appears  encephalopathic at time of this exam; appears less engaged, less briskly reactive than prior exam.    . Nephrology following.  No recommendations for dialysis at this time  No one is at the  bedside today at time of this exam. No plans for trach at this time.       Vitals:   01/13/21 1058 01/13/21 1100 01/13/21 1200 01/13/21 1300  BP:  135/86 122/76 134/90  Pulse: 94 94 90 97  Resp: (!) 22 17 (!) 23 20  Temp:   98.8 F (37.1 C)   TempSrc:   Axillary   SpO2: 94% (!) 89% 95% 96%  Weight:      Height:       CBC:  Recent Labs  Lab 01/12/21 0927 01/13/21 0334  WBC 9.6 9.2  NEUTROABS 7.0  --   HGB 8.2* 7.6*  HCT 27.2* 25.0*  MCV 96.1 95.8  PLT 148* 0000000   Basic Metabolic Panel:  Recent Labs  Lab 01/10/21 0024 01/10/21 0323 01/10/21 1859 01/11/21 0224 01/12/21 0024 01/12/21 1403 01/13/21 0334  NA 151*   < >  --    < > 144  --  144  K 3.4*   < >  --    < > 4.2 4.7 4.5  CL 125*  --   --    < > 117*  --  115*  CO2 16*  --   --    < > 15*  --  18*  GLUCOSE 106*  --   --    < > 128*  --  122*  BUN 47*  --   --    < > 53*  --  60*  CREATININE 6.58*  --   --    < > 6.16*  --  6.09*  CALCIUM 9.0  --   --    < > 9.1  --  9.9  MG 2.2  --  1.8  --   --  1.8  --   PHOS 5.4*  --  4.3  --   --   --   --    < > = values in this interval not displayed.   Lipid Panel:  Recent Labs  Lab 01/08/21 0000 01/08/21 0817  CHOL 184  --   TRIG 71 76  HDL 95  --   CHOLHDL 1.9  --   VLDL 14  --   LDLCALC 75  --    HgbA1c:  Recent Labs  Lab 01/07/21 1037  HGBA1C 5.4   Urine Drug Screen:  No  results for input(s): LABOPIA, COCAINSCRNUR, LABBENZ, AMPHETMU, THCU, LABBARB in the last 168 hours.  Alcohol Level  Recent Labs  Lab 01/16/2021 1411  ETH <10    IMAGING past 24 hours DG CHEST PORT 1 VIEW  Result Date: 01/13/2021 CLINICAL DATA:  Acute respiratory failure with hypoxia EXAM: PORTABLE CHEST 1 VIEW COMPARISON:  01/12/2021. FINDINGS: Endotracheal tube tip 3.8 cm above the carina. Enteric tube courses below the diaphragm in outside the field of view. Similar streaky right basilar opacities, compatible with atelectasis. No new consolidation. No visible pleural effusions or pneumothorax.  Similar cardiomediastinal silhouette. IMPRESSION: Similar right basilar atelectasis.  No new acute abnormality. Electronically Signed   By: Margaretha Sheffield M.D.   On: 01/13/2021 07:21    PHYSICAL EXAM Physical Exam  Constitutional: Appears thin, chronically ill who is intubated and sedated Eyes: No scleral injection. Proptotic, which is new per family, right worse than left.  Right eye also has some exudate/clouding and is chemotic HENT: No oropharyngeal obstruction.  MSK: no joint deformities.  Cardiovascular: Tachycardia 100s, SR on cardiac monitor  Respiratory: Intubated, not labored,symmetric rise  Skin: Warm dry and intact visible skin   Neuro: Mental Status: Patient is  Awake, intubated, off sedation.  Appears drowsy but easily arousable following simple but not complex commands. Eyes open.  Cranial Nerves: II: Pupils are equal, round, and reactive to light.  2 mm -> 1 mm reactive, III,IV, VI: EOM w/ occular bobbing, mildly dysconjugate gaze V: Facial sensation corneals bilaterally intact VII: Facial movement is notable for upper face more reactive on the left than the right, lower face limited eval due to ETT VIII: hearing is intact to voice X/XI: Gag present  Motor: Antigravity on LUE, RUE weakly following commands with the hand, Antigravity LLE  and 2/5 RLE.   Sensory: Less  reactive on the left than the right Cerebellar: UTA     ASSESSMENT/PLAN Timothy Good is a 55 y.o. male with a past medical history significant for uncontrolled hypertension complicated by CKD stage IV, heart failure, hyperlipidemia, daily drinking, medication nonadherence, iron deficiency anemia/anemia of chronic disease, presenting with a pontine hemorrhage. ICH Score: 3. NIHSS 24. Family reported he had some difficulty swallowing for a few days and had been complaining of GI discomfort.   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   Code Stroke  1. Examination is positive for hemorrhagic infarct within the brainstem. 2. Posterior extension of clot into the fourth ventricle and the cisterna magna. 3. Chronic small vessel ischemic disease and brain atrophy. MRI   1. Acute brainstem hemorrhage centered at the pons with estimated intra-axial blood volume of 10 mL. Extensive pontine edema, which tracks associated edema which tracks into the midbrain right > left and into the right medullary pyramid. 2. Mild extension of hemorrhage into the 4th ventricle and basilar subarachnoid spaces. But no ventriculomegaly. No midline shift or impending herniation. 3. Underlying advanced chronic small vessel disease including multiple chronic microhemorrhages in the cerebellum and bilateral deep gray matter nuclei. Advanced bilateral cerebral white matter ischemia. 4. Negative noncontrast MRI appearance of the orbits. MR Orbits Negative noncontrast MRI appearance of the orbits.  2D Echo  1. Left ventricular ejection fraction, by estimation, is 55 to 60%. The  left ventricle has normal function. The left ventricle demonstrates  regional wall motion abnormalities. Basal inferior akinesis.There is moderate asymmetric left ventricular  hypertrophy of the basal-septal segment. Left ventricular diastolic parameters are consistent with Grade  I diastolic dysfunction (impaired relaxation).   2. Right ventricular systolic function is normal. The right ventricular  size is normal. Tricuspid regurgitation signal is inadequate for assessing PA pressure.   3. The mitral valve is normal in structure. No evidence of mitral valve regurgitation.   4. The aortic valve was not well visualized. Aortic valve regurgitation is mild. No aortic stenosis is present.   5. The inferior vena cava is normal in size with greater than 50%  Resp   CT Chest Abd Pelvis 1. Suggestion of generalized gastric wall thickening, poorly assessed  on this scan due to gastric decompression by enteric tube, with associated fat stranding in the gastrohepatic ligament. Findings are nonspecific and could be due to noninflammatory edema,gastritis, peptic ulcer disease or neoplasm. Consider upper endoscopic correlation as clinically warranted. 2. Small volume ascites, predominantly perihepatic. 3. Prominently distended gallbladder. No radiopaque cholelithiasis. No gallbladder wall thickening. No pericholecystic fluid. No biliary ductal dilatation. 4. Moderate diffuse colonic diverticulosis. 5. Mild to moderate dependent bibasilar atelectasis. 6. Coronary atherosclerosis. 7. Chronic appearing mild T10 vertebral compression fracture. 8. Aortic Atherosclerosis (ICD10-I70.0).  LDL 75 HgbA1c 5.4 VTE prophylaxis - SCDs, lovenox     Diet   Diet NPO time specified   Not on Anticoagulant or antiplatelet prior to admission Not taking Rx medications prior to admission Hold anticoagulant/antiplatelet medications in the setting of hemorrhage Therapy recommendations:  pending patient status  Disposition:  TBD       Cerebral Edema Neurosurgery consulted, no acute intervention recommended at this time. Hypertonic saline infusion has been discontinued, allow sodium to trend down with daily check   Na 149->147->153->158->159->161->159       Acute respiratory failure       Probable aspiration pneumonia Remains intubated, appreciate CCM management First on Unasyn, now on Rocephin Resp culture: few gram positive cocci Precedex off     Hypertension Home meds: not taking any prescribed meds >1 year  Unstable Keep systolic BP less than [id number]  Long-term BP goal normotensive  Hyperlipidemia Home meds:  None LDL  75, almost at goal < 70 High intensity statin to be addressed at discharge in setting of pontine hemorrhage Continue statin at discharge       Glycemic management No hx of DM2  HgbA1c  5.4, at goal < 7.0 CBGs Recent Labs    01/13/21 0343 01/13/21 0826 01/13/21 1258  GLUCAP 115* 107* 122*   SSI       AKI on CKD IV Creatinine 3.65->3.80->-5.2 LR x 8 hours  and free water  ?urinary retention vs. oliguria, ?need to replace foley CCM on board         Hypokalemia Monitor and replete as needed 3.9->2.8->3.6-       ETOH use disorder with dependence Daily use of excessive liquor per family (?1/2 bottle vodka)  Thiamine, folate and MVI on board Monitor magnesium, 2.2->2.2->2.1 Vitamin B12 -213 Monitor s/s of withdrawal, need for CIWA        Feeding      Dysphagia Continue tube feeding and free water        Abd discomfort on presentation CT, Chest Abd Pelvis: generalized gastric wall thickening, with associated fat stranding in the gastrohepatic ligament.Findings are nonspecific and could be due to noninflammatory edema,gastritis, peptic ulcer disease or neoplasm.  CCM, Dr. Valeta Harms: nothing acutely concerning   Other Stroke Risk Factors Current Cigarette smoker, will be advised to stop smoking ETOH use disorder alcohol level <10, advised to drink no more than 2 drink(s) a day  Other Active Problems ? 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 .  Appreciate nephrology help.  Wean off ventilatory support as per CCM team.  Discussed with Dr. Elsworth Soho and Carolin Sicks.  No family available at bedside for discussion today   .  This patient is critically ill and at significant risk of  neurological worsening, death and care requires constant monitoring of vital signs, hemodynamics,respiratory and cardiac monitoring, extensive review of multiple databases, frequent neurological assessment, discussion with family, other specialists and medical decision making of high complexity.I have made any additions or clarifications directly to the above note.This critical care time does not reflect procedure time, or teaching time or supervisory time of PA/NP/Med Resident etc but could involve care discussion time.  I spent 30 minutes of neurocritical care time  in the care of  this patient. Antony Contras MD    To contact Stroke Continuity provider, please refer to [email or web address] After hours, contact General Neurology

## 2021-01-13 NOTE — Progress Notes (Signed)
[facility] NEPHROLOGY PROGRESS NOTE  Assessment/ Plan:  # Acute kidney injury on chronic kidney disease stage III-IV: Underlying chronic kidney disease from hypertension (followed by Dr. Theador Hawthorne in [place]) and his acute kidney injury appears to be from ischemic ATN.  Received IV Lasix with albumin yesterday with good response.  Around 3.1 L of urine output and creatinine level is a stable.  No acute indication for dialysis today.  We will continue daily lab, strict ins and outs for renal recovery.    # Hypokalemia: Potassium level in acceptable range today.  # Hypertension: Blood pressures currently well controlled on a combination of amlodipine, carvedilol and hydralazine.  Continue to hold ACE inhibitor and monitor with diuretic trial.  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  #  Hypernatremia: Medically induced/iatrogenic for management of cerebral edema.  Sodium level normal now.  #Metabolic acidosis: Start oral sodium bicarbonate.  # Acute hypoxic respiratory failure/VDRF: Associated with CVA and on Unasyn for possible aspiration pneumonia.   Subjective: Seen and examined.  Urine output is around 3.1 L.  He is on vent and not really responding. Objective Vital signs in last 24 hours: Vitals:   01/13/21 0525 01/13/21 0600 01/13/21 0758 01/13/21 0800  BP: 137/85 136/82  (!) 148/81  Pulse:  96 (!) 101 99  Resp:  (!) 23 (!) 29 (!) 23  Temp:    98.7 F (37.1 C)  TempSrc:    Axillary  SpO2:  95% 95% 94%  Weight:      Height:       Weight change: 3.2 kg  Intake/Output Summary (Last 24 hours) at 01/13/2021 0931 Last data filed at 01/13/2021 0600 Gross per 24 hour  Intake 2439.57 ml  Output 3330 ml  Net -890.43 ml       Labs: Basic Metabolic Panel: Recent Labs  Lab 01/09/21 1641 01/10/21 0024 01/10/21 0323  01/10/21 1859 01/11/21 0224 01/12/21 0024 01/12/21 1403 01/13/21 0334  NA  --  151*   < >  --  143 144  --  144  K  --  3.4*   < >  --  3.5 4.2 4.7 4.5  CL  --  125*  --   --  116* 117*  --  115*  CO2  --  16*  --   --  15* 15*  --  18*  GLUCOSE  --  106*  --   --  116* 128*  --  122*  BUN  --  47*  --   --  50* 53*  --  60*  CREATININE  --  6.58*  --   --  6.27* 6.16*  --  6.09*  CALCIUM  --  9.0  --   --  8.8* 9.1  --  9.9  PHOS 3.6 5.4*  --  4.3  --   --   --   --    < > = values in this interval not displayed.   Liver Function Tests: Recent Labs  Lab 01/14/2021 1308 01/07/21 0300  AST 31  --   ALT 15  --   ALKPHOS 71  --   BILITOT 0.8  --   PROT 7.6  --   ALBUMIN 3.3* 2.5*   Recent Labs  Lab 01/16/2021 1308  LIPASE 28   No results for input(s): AMMONIA in the last 168 hours. CBC: Recent Labs  Lab 12/31/2020 1308 01/16/2021 1321 01/07/21 0300 01/08/21 0000 01/10/21 0323 01/12/21 0927 01/13/21 0334  WBC 10.4  --  8.8 10.6*  --  9.6 9.2  NEUTROABS 9.4*  --   --   --   --  7.0  --   HGB 10.9*   < > 9.0* 8.6* 8.2* 8.2* 7.6*  HCT 33.4*   < > 29.4* 28.8* 24.0* 27.2* 25.0*  MCV 94.4  --  96.7 100.3*  --  96.1 95.8  PLT 251  --  189 157  --  148* 152   < > = values in this interval not displayed.   Cardiac Enzymes: Recent Labs  Lab 01/05/2021 1801 01/09/21 1641  CKTOTAL 419* 676*   CBG: Recent Labs  Lab 01/12/21 1541 01/12/21 1935 01/12/21 2327 01/13/21 0343 01/13/21 0826  GLUCAP 120* 121* 127* 115* 107*    Iron Studies: No results for input(s): IRON, TIBC, TRANSFERRIN, FERRITIN in the last 72 hours. Studies/Results: DG Chest 1 View  Result Date: 01/12/2021 CLINICAL DATA:  Intubation, stroke EXAM: CHEST  1 VIEW COMPARISON:  Portable exam 0942 hours compared to 01/01/2021 FINDINGS: Tip of endotracheal tube projects 3.0 cm above carina. Feeding tube extends into abdomen. Normal heart size mediastinal contours. Persistent RIGHT basilar atelectasis with  improved aeration at LEFT base since previous exam. No new infiltrate, pleural effusion, or pneumothorax. Bones demineralized. IMPRESSION: Persistent mild RIGHT basilar atelectasis. Improved aeration at LEFT lung base since previous exam. Electronically Signed   By: Lavonia Dana M.D.   On: 01/12/2021 12:03   DG CHEST PORT 1 VIEW  Result Date: 01/13/2021 CLINICAL DATA:  Acute respiratory failure with hypoxia EXAM: PORTABLE CHEST 1 VIEW COMPARISON:  01/12/2021. FINDINGS: Endotracheal tube tip 3.8 cm above the carina. Enteric tube courses below the diaphragm in outside the field of view. Similar streaky right basilar opacities, compatible with atelectasis. No new consolidation. No visible pleural effusions or pneumothorax. Similar cardiomediastinal silhouette. IMPRESSION: Similar right basilar atelectasis.  No new acute abnormality. Electronically Signed   By: Margaretha Sheffield M.D.   On: 01/13/2021 07:21   DG Abd Portable 1V  Result Date: 01/11/2021 CLINICAL DATA:  Nasogastric tube placement EXAM: PORTABLE ABDOMEN - 1 VIEW COMPARISON:  None. FINDINGS: Enteric feeding tube is positioned below the diaphragm with tip in the vicinity of the pylorus. Nonobstructive pattern of included bowel gas. IMPRESSION: Enteric feeding tube is positioned below the diaphragm with tip in the vicinity of the pylorus. Consider advancement if post pyloric positioning is desired. Electronically Signed   By: Delanna Ahmadi M.D.   On: 01/11/2021 13:21    Medications: Infusions:  sodium chloride Stopped (01/13/21 0158)   cefTRIAXone (ROCEPHIN)  IV Stopped (01/12/21 2221)   dexmedetomidine (PRECEDEX) IV infusion Stopped (01/08/21 0904)   feeding supplement (VITAL 1.5 CAL) 1,000 mL (01/13/21 0538)    Scheduled Medications:  amLODipine  10 mg Per Tube Daily   carvedilol  25 mg Per Tube BID WC   chlorhexidine gluconate (MEDLINE KIT)  15 mL Mouth Rinse BID   Chlorhexidine Gluconate Cloth  6 each Topical Daily   feeding  supplement (PROSource TF)  45 mL Per Tube BID   folic acid  1 mg Per Tube Daily   free water  200 mL Per Tube Q6H   hydrALAZINE  100 mg Per Tube Q8H   insulin aspart  0-6 Units Subcutaneous Q4H   mouth rinse  15 mL Mouth Rinse 10 times per day   multivitamin with minerals  1 tablet Per Tube Daily   pantoprazole sodium  40 mg Per Tube Daily   thiamine  100 mg Per Tube Daily    have reviewed scheduled and prn medications.  Physical Exam: General: Ill-looking male on mechanical ventilation, right eye proptosis Heart:RRR, s1s2 nl Lungs: Coarse breath sound bilateral Abdomen:soft, Non-tender, non-distended Extremities:No edema Neurology: Not responding and not following command  Timothy Good Timothy Good Timothy Good 01/13/2021,9:31 AM  LOS: 7 days

## 2021-01-13 NOTE — Progress Notes (Signed)
PT working with patient. Will hold CPT.

## 2021-01-13 NOTE — Progress Notes (Signed)
NAME:  Timothy Good, MRN:  [id number], DOB:  1965/12/23, LOS: 7 ADMISSION DATE:  01/19/2021, CONSULTATION DATE:  01/29/2021 REFERRING MD:  [place], CHIEF COMPLAINT:  found down   History of Present Illness:  55 y/o male with multiple medical problems was found down by in a well call visit by police and was brought to [facility] with a diagnosis of pontine hemorrhage.  He was intubated for airway protection and then transferred to [facility] for further evaluation.  Pulmonary and critical care medicine was consulted for medical management in the setting of acute hemorrhagic stroke.  The patient was unable to provide history because he was intubated.  Significant Hospital Events: Including procedures, antibiotic start and stop dates in addition to other pertinent events   January 06, 2021 admission, intubation 10/13: remains intubated  Interim History / Subjective:  Patient intubated/mechanically ventilated Secretions remain thick; CXR with no significant change Patient opens eyes to command; L hemiparesis; R UE and LE weak but moves to commands On no sedation UOP -3.1 L last 24 hours (-1.5 L day prior) Creat 6.09 (from 6.16) SBP 130s No fever   Objective   Blood pressure 136/82, pulse 96, temperature 98.4 F (36.9 C), temperature source Oral, resp. rate (!) 23, height '6\' 2"'$  (1.88 m), weight 79.2 kg, SpO2 95 %.    Vent Mode: PRVC FiO2 (%):  [30 %-40 %] 30 % Set Rate:  [12 bmp] 12 bmp Vt Set:  [490 mL] 490 mL PEEP:  [5 cmH20] 5 cmH20 Pressure Support:  [8 cmH20] 8 cmH20 Plateau Pressure:  [13 cmH20-18 cmH20] 15 cmH20   Intake/Output Summary (Last 24 hours) at 01/13/2021 0706 Last data filed at 01/13/2021 0600 Gross per 24 hour  Intake 2559.57 ml  Output 3465 ml  Net -905.43 ml    Filed Weights   01/10/21 0500 01/12/21 0439 01/13/21 0406  Weight: 76.1 kg 76 kg 79.2 kg    Examination: General:  critically ill appearing on mech vent HEENT: MM pink/moist;  eyes dry w/ proptosis with white drainage right eye worse than left; ETT in place Neuro: opens eyes to command; L hemiparesis; R UE and LE weak but moves to commands CV: s1s2, NSR, no m/r/g PULM:  dim clear BS bilaterally; on mech vent PRVC GI: soft, bsx4 active  Extremities: warm/dry, no edema  Skin: no rashes or lesions   Labs Na 144 BUN 60 (53) Creat 6.09 (6.16) WBC 9.2 Hgb 7.6  CXR: personally reviewed; ETT in good position; small R atelectasis unchanged from previous CXR  Resolved Hospital Problem list   Induced hypernatremia  Assessment & Plan:   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 uncontrolled hypertension Cerebral edema Acute encephalopathy due to hemorrhagic stroke P: -frequent neuro checks; limit sedating medications -continue amlodipine, hydralazine, coreg -continue lasix 80 mg TID -continue free water remains at 200 q6 -prn tylenol for fever  Acute respiratory failure with hypoxemia acute hemorrhagic pontine stroke Probable aspiration pneumonia P:  -continue mechanical ventilation PRVC 6-8 cc/kg -wean fio2 for sats >92% -VAP prevention in place -continue Ceftriaxone (started 10/9); follow cultures -Daily SBT/SAT -CPT TID -Abg/CXR as needed  Acute kidney injury on Chronic kidney disease stage IV:  Tried hydration 10/10. Nephro consult 10/11. Hypokalemia: improved P: -Creat continues to improve -continue lasix 80 scheduled -Trend BMP / urinary output -Replace electrolytes as indicated -Avoid nephrotoxic agents, ensure adequate renal perfusion  Uncontrolled hypertension P: -continue BP meds as above  Alcohol dependence P: -  continue thiamine/folate -monitor for signs of withdrawal  Probable hyperthyroidism: tsh decreased; T4 normal Mild proptosis P: -eye drops prn -continue beta blocker   Best Practice (right click and "Reselect all SmartList Selections" daily)   Diet/type: tubefeeds DVT prophylaxis: SCD GI  prophylaxis: H2B Lines: N/A Foley:  Yes, and it is still needed Code Status:  full code Last date of multidisciplinary goals of care discussion [per primary]    CRITICAL CARE Performed by: Mick Sell   Total critical care time: 35 minutes  JD Rexene Agent [facility] 01/13/2021, 7:06 AM  Please see [email or web address] for pager details.  From 7A-7P if no response, please call [phone]. After hours, please call ELink [phone].

## 2021-01-14 ENCOUNTER — Inpatient Hospital Stay (HOSPITAL_COMMUNITY): Payer: Medicaid Other

## 2021-01-14 DIAGNOSIS — I161 Hypertensive emergency: Secondary | ICD-10-CM | POA: Diagnosis not present

## 2021-01-14 DIAGNOSIS — J9601 Acute respiratory failure with hypoxia: Secondary | ICD-10-CM | POA: Diagnosis not present

## 2021-01-14 DIAGNOSIS — Z978 Presence of other specified devices: Secondary | ICD-10-CM | POA: Diagnosis not present

## 2021-01-14 DIAGNOSIS — I613 Nontraumatic intracerebral hemorrhage in brain stem: Secondary | ICD-10-CM | POA: Diagnosis not present

## 2021-01-14 DIAGNOSIS — N179 Acute kidney failure, unspecified: Secondary | ICD-10-CM | POA: Diagnosis not present

## 2021-01-14 LAB — CBC
HCT: 25.8 % — ABNORMAL LOW (ref 39.0–52.0)
Hemoglobin: 7.7 g/dL — ABNORMAL LOW (ref 13.0–17.0)
MCH: 28.6 pg (ref 26.0–34.0)
MCHC: 29.8 g/dL — ABNORMAL LOW (ref 30.0–36.0)
MCV: 95.9 fL (ref 80.0–100.0)
Platelets: 168 10*3/uL (ref 150–400)
RBC: 2.69 MIL/uL — ABNORMAL LOW (ref 4.22–5.81)
RDW: 15.1 % (ref 11.5–15.5)
WBC: 8.4 10*3/uL (ref 4.0–10.5)
nRBC: 0 % (ref 0.0–0.2)

## 2021-01-14 LAB — GLUCOSE, CAPILLARY
Glucose-Capillary: 112 mg/dL — ABNORMAL HIGH (ref 70–99)
Glucose-Capillary: 119 mg/dL — ABNORMAL HIGH (ref 70–99)
Glucose-Capillary: 125 mg/dL — ABNORMAL HIGH (ref 70–99)
Glucose-Capillary: 120 mg/dL — ABNORMAL HIGH (ref 70–99)
Glucose-Capillary: 125 mg/dL — ABNORMAL HIGH (ref 70–99)
Glucose-Capillary: 131 mg/dL — ABNORMAL HIGH (ref 70–99)

## 2021-01-14 LAB — RENAL FUNCTION PANEL
Albumin: 2 g/dL — ABNORMAL LOW (ref 3.5–5.0)
Anion gap: 13 (ref 5–15)
BUN: 71 mg/dL — ABNORMAL HIGH (ref 6–20)
CO2: 17 mmol/L — ABNORMAL LOW (ref 22–32)
Calcium: 10.2 mg/dL (ref 8.9–10.3)
Chloride: 113 mmol/L — ABNORMAL HIGH (ref 98–111)
Creatinine, Ser: 5.69 mg/dL — ABNORMAL HIGH (ref 0.61–1.24)
GFR, Estimated: 11 mL/min — ABNORMAL LOW (ref >60)
Glucose, Bld: 127 mg/dL — ABNORMAL HIGH (ref 70–99)
Phosphorus: 4.6 mg/dL (ref 2.5–4.6)
Potassium: 4.7 mmol/L (ref 3.5–5.1)
Sodium: 143 mmol/L (ref 135–145)

## 2021-01-14 IMAGING — CT CT HEAD W/O CM
4 series · 16 of 47 positions shown, 18 images · non-contrast
Comparison: CT head [DATE].

CLINICAL DATA: Stroke, follow up

EXAM:
CT HEAD WITHOUT CONTRAST
TECHNIQUE: Contiguous axial images were obtained from the base of the skull
through the vertex without intravenous contrast.

[Series 3: head wo · axial · 0.46mm/px · z∈[-132,+12]mm · 7 of 39 slices shown, 9 images]
[im 5/39  brain]
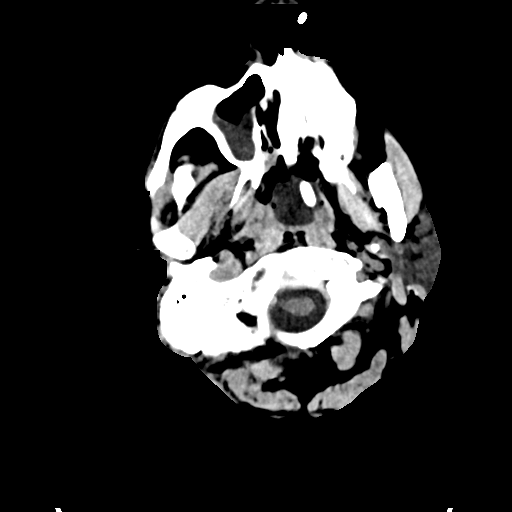
[im 5/39  bone]
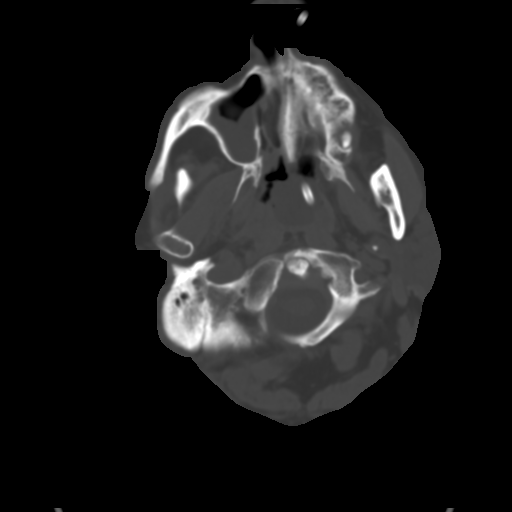
[im 10/39  brain]
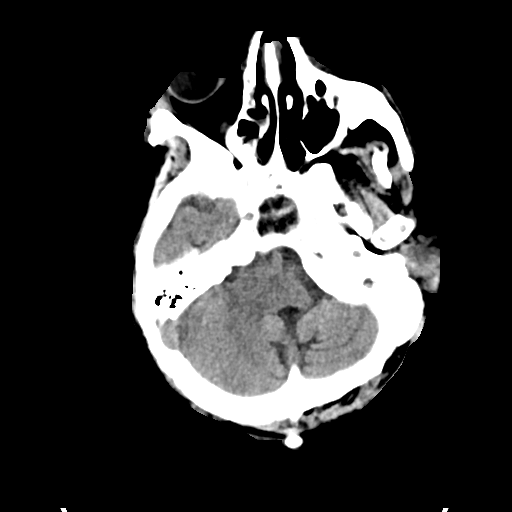
[im 15/39  brain]
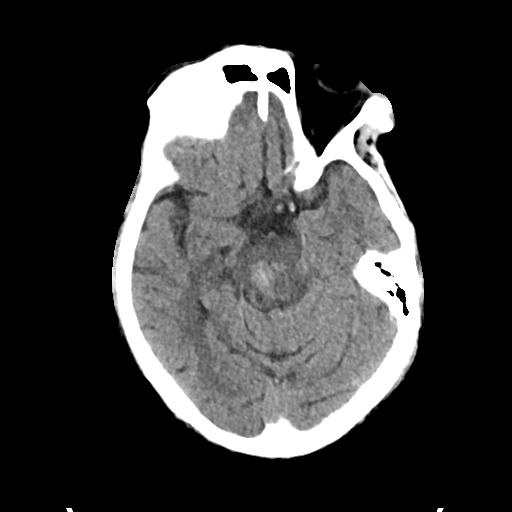
[im 20/39  brain]
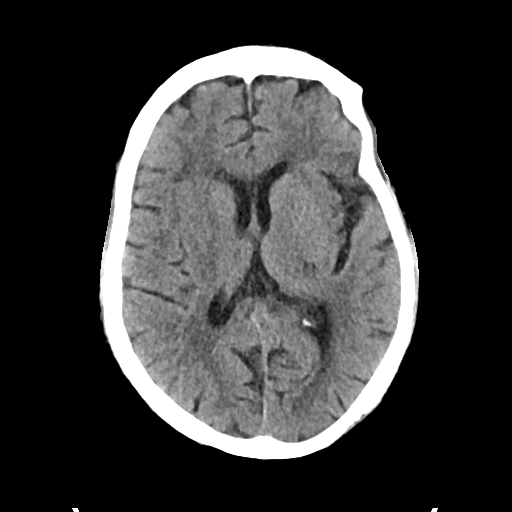
[im 24/39  brain]
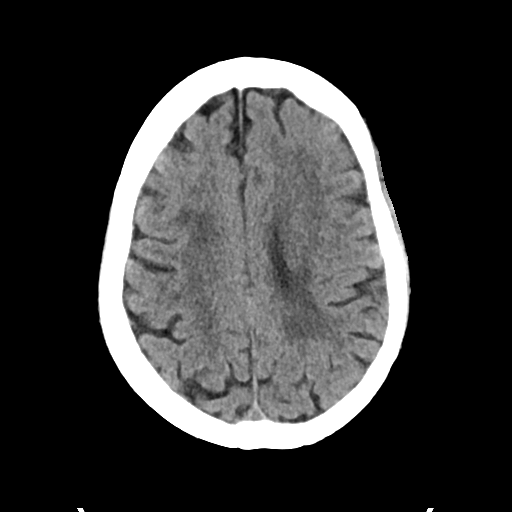
[im 24/39  bone]
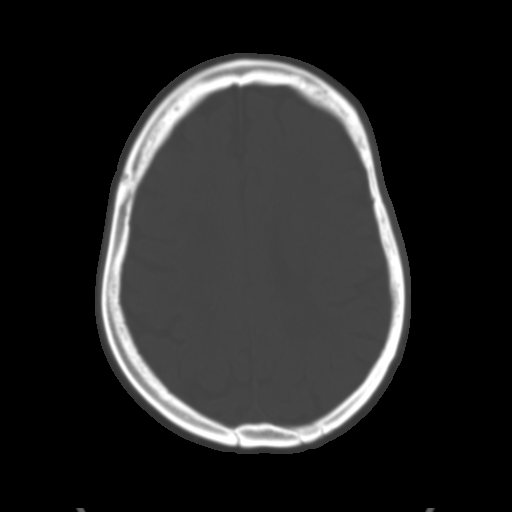
[im 29/39  brain]
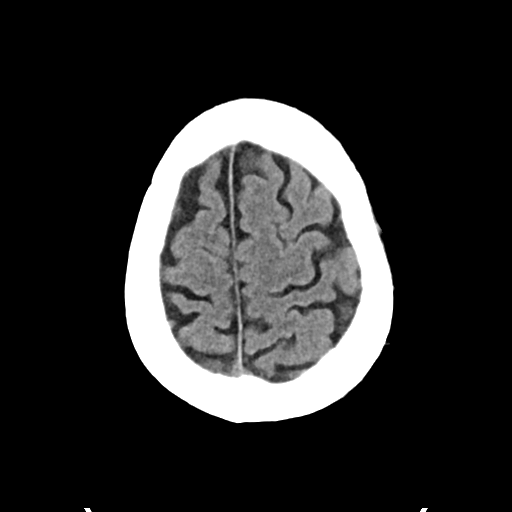
[im 34/39  brain]
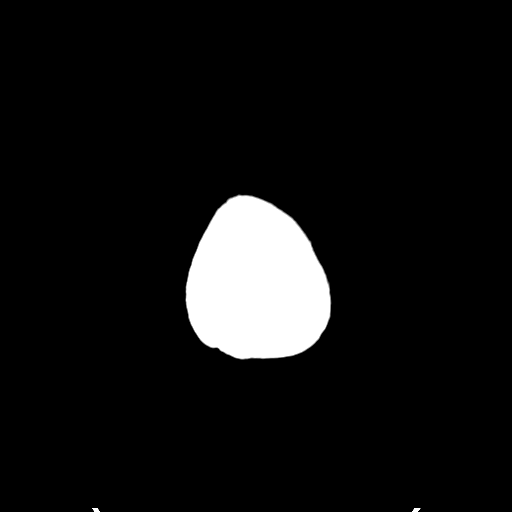

[Series 4: head bone · axial · 0.46mm/px · z∈[-134,-96]mm · 3 of 96 slices shown]
[im 10/96  bone]
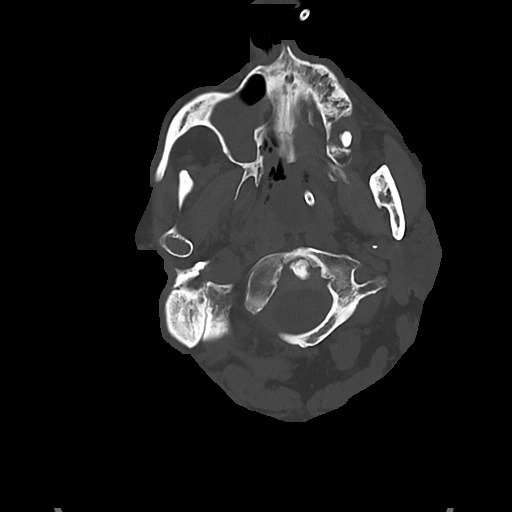
[im 20/96  bone]
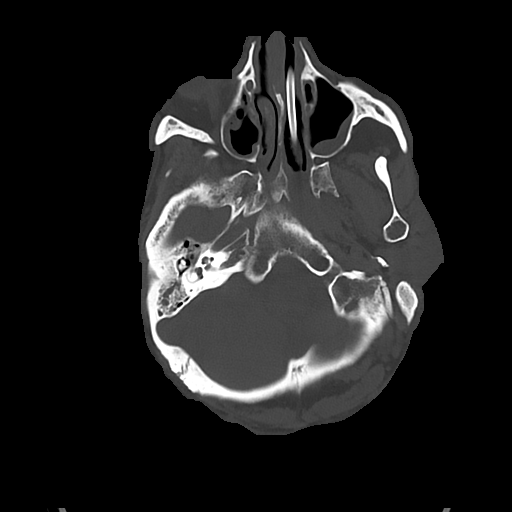
[im 29/96  bone]
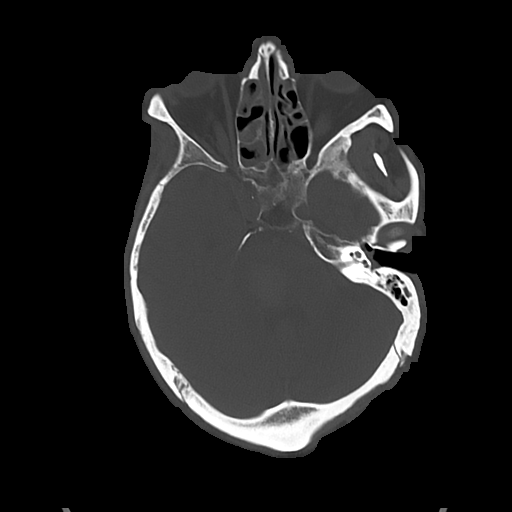

[Series 5: cor soft · coronal · 0.33mm/px · 3 of 75 slices shown]
[im 25/75  brain]
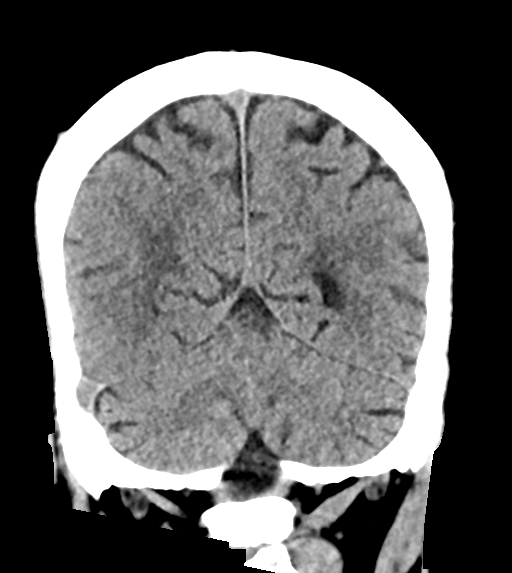
[im 33/75  brain]
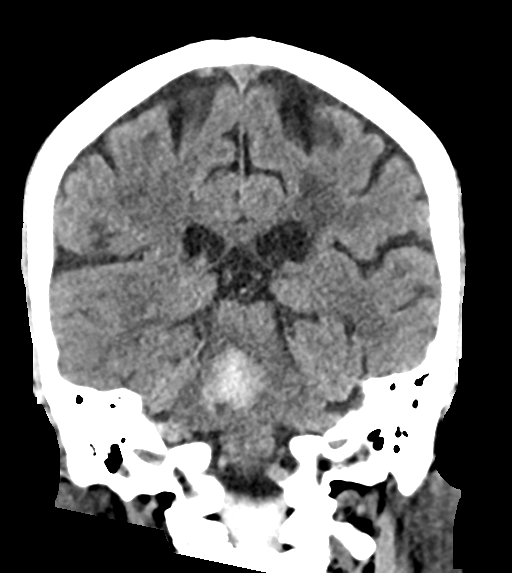
[im 42/75  brain]
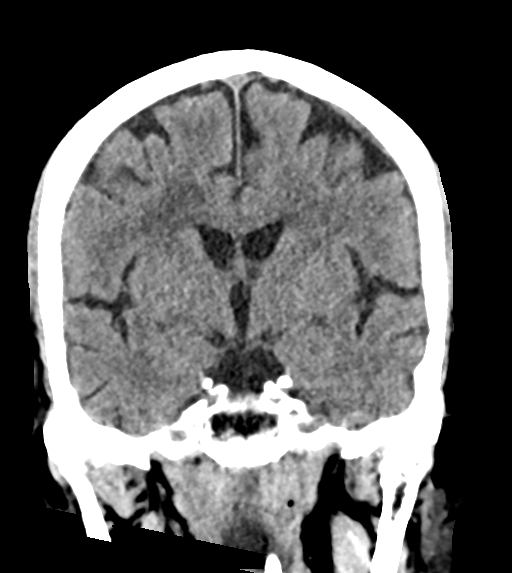

[Series 6: sag soft · sagittal · 0.37mm/px · 3 of 56 slices shown]
[im 23/56  brain]
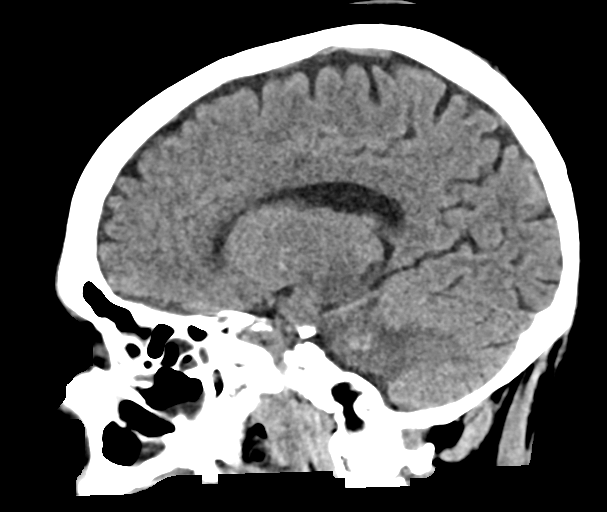
[im 28/56  brain]
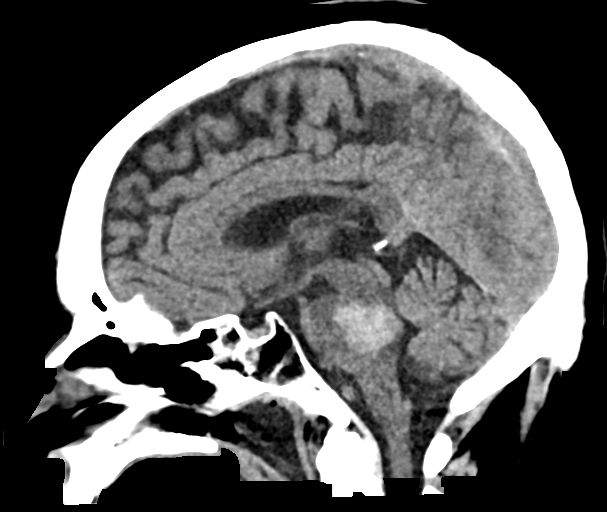
[im 33/56  brain]
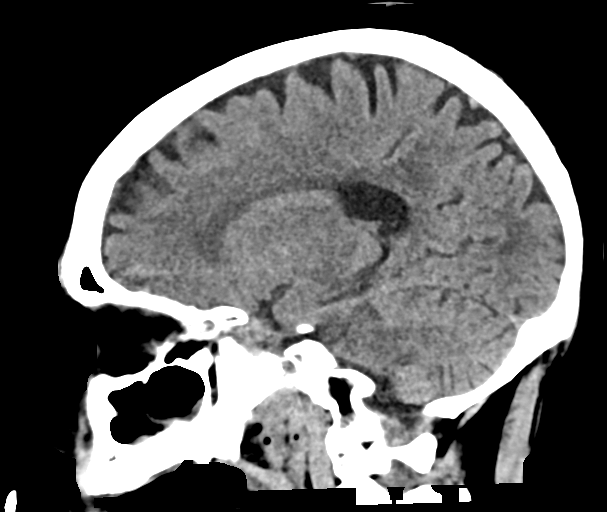

[16 of 47 positions shown; findings below may reference images not displayed]

FINDINGS: Brain: When accounting for differences in technique (different plane
of imaging on axial imaging), similar size an intraparenchymal
hemorrhage within the pons, measuring up to approximately 2.0 by
by 2.6 cm (similar when remeasured). Surrounding edema in the
brainstem appears similar. Intraventricular hemorrhage in the fourth
ventricle is decreased. Similar effacement of the fourth ventricle
without progressive ventriculomegaly to suggest obstructive
hydrocephalus.

No evidence of interval acute large vascular territory infarct.
Patchy white matter hypoattenuation, compatible with advanced
chronic microvascular ischemic disease that appears similar to
prior. No midline shift. Similar mild cerebral atrophy.

Vascular: No hyperdense vessel identified.

Skull: No acute fracture.

Sinuses/Orbits: Mucosal thickening of scattered paranasal sinuses,
similar. No acute orbital findings.

Other: Small bilateral mastoid effusions.
IMPRESSION: 1. When accounting for differences in technique, similar size of a
recent intraparenchymal hemorrhage within the pons, as detailed
above. Similar surrounding brainstem edema. Intraventricular
hemorrhage in the fourth ventricle is decreased. Similar effacement
of the fourth ventricle without progressive ventriculomegaly to
suggest obstructive hydrocephalus.
2. Similar advanced chronic microvascular ischemic disease.

## 2021-01-14 MED ORDER — FENTANYL CITRATE PF 50 MCG/ML IJ SOSY
25.0000 ug | PREFILLED_SYRINGE | INTRAMUSCULAR | Status: DC | PRN
  Administered 2021-01-15 – 2021-01-21 (×6): 50 ug via INTRAVENOUS
  Administered 2021-01-21: 100 ug via INTRAVENOUS
  Administered 2021-01-21 – 2021-01-22 (×3): 50 ug via INTRAVENOUS
  Filled 2021-01-14: qty 2
  Filled 2021-01-14: qty 1
  Filled 2021-01-14 (×3): qty 2
  Filled 2021-01-14 (×5): qty 1

## 2021-01-14 MED ORDER — CIPROFLOXACIN HCL 0.3 % OP SOLN
2.0000 [drp] | OPHTHALMIC | Status: DC
  Administered 2021-01-14 – 2021-01-31 (×77): 2 [drp] via OPHTHALMIC
  Filled 2021-01-14 (×3): qty 2.5

## 2021-01-14 MED ORDER — ERYTHROMYCIN 5 MG/GM OP OINT
TOPICAL_OINTMENT | Freq: Every day | OPHTHALMIC | Status: DC
  Filled 2021-01-14 (×2): qty 3.5

## 2021-01-14 MED ORDER — SODIUM BICARBONATE 650 MG PO TABS
650.0000 mg | ORAL_TABLET | Freq: Three times a day (TID) | ORAL | Status: DC
  Administered 2021-01-14 – 2021-01-24 (×31): 650 mg
  Filled 2021-01-14 (×29): qty 1

## 2021-01-14 MED ORDER — SODIUM BICARBONATE 650 MG PO TABS
650.0000 mg | ORAL_TABLET | Freq: Three times a day (TID) | ORAL | Status: DC
  Filled 2021-01-14: qty 1

## 2021-01-14 NOTE — Progress Notes (Signed)
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 Theador Hawthorne in [place]) and his acute kidney injury appears to be from ischemic ATN.  Received IV Lasix with albumin on 10/13.   Nonoliguric and creatinine level trending down.  Volume status acceptable.  No acute need for dialysis.  We will continue daily lab, strict ins and outs and watch for renal recovery.    # Hypokalemia: Potassium level in acceptable range today.  # Hypertension: Blood pressures currently well controlled on a combination of amlodipine, carvedilol and hydralazine.  Continue to hold ACE inhibitor and monitor with diuretic trial.  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  #  Hypernatremia: Medically induced/iatrogenic for management of cerebral edema.  Sodium level normal now.  #Metabolic acidosis: Start sodium bicarbonate.  # Acute hypoxic respiratory failure/VDRF: Treated with ceftriaxone.  Vent managed by pulmonary team.  Subjective: Seen and examined.  Urine output is around 1.9 L.  He is on vent and not responding.  No new event.  Objective Vital signs in last 24 hours: Vitals:   01/14/21 0800 01/14/21 0805 01/14/21 0806 01/14/21 0900  BP: 122/79   110/70  Pulse: 90 96  85  Resp: (!) 22 19  (!) 23  Temp: 98.6 F (37 C)     TempSrc: Axillary     SpO2: 95% 95% 95% 92%  Weight:      Height:       Weight change: 0.2 kg  Intake/Output Summary (Last 24 hours) at 01/14/2021 1009 Last data filed at 01/14/2021 0900 Gross per 24 hour  Intake 2447.01 ml  Output 2470 ml  Net -22.99 ml        Labs: Basic Metabolic Panel: Recent Labs  Lab 01/10/21 0024 01/10/21 0323 01/10/21 1859 01/11/21 0224 01/12/21 0024  01/12/21 1403 01/13/21 0334 01/14/21 0300  NA 151*   < >  --    < > 144  --  144 143  K 3.4*   < >  --    < > 4.2 4.7 4.5 4.7  CL 125*  --   --    < > 117*  --  115* 113*  CO2 16*  --   --    < > 15*  --  18* 17*  GLUCOSE 106*  --   --    < > 128*  --  122* 127*  BUN 47*  --   --    < > 53*  --  60* 71*  CREATININE 6.58*  --   --    < > 6.16*  --  6.09* 5.69*  CALCIUM 9.0  --   --    < > 9.1  --  9.9 10.2  PHOS 5.4*  --  4.3  --   --   --   --  4.6   < > = values in this interval not displayed.    Liver Function Tests: Recent Labs  Lab 01/14/21 0300  ALBUMIN 2.0*    No results for input(s): LIPASE, AMYLASE in the last 168 hours.  No results for input(s): AMMONIA in the last 168 hours. CBC: Recent Labs  Lab 01/08/21 0000 01/10/21 0323 01/12/21 0927 01/13/21 0334 01/14/21 0300  WBC 10.6*  --  9.6 9.2 8.4  NEUTROABS  --   --  7.0  --   --   HGB 8.6*   < > 8.2* 7.6* 7.7*  HCT 28.8*   < > 27.2* 25.0* 25.8*  MCV 100.3*  --  96.1 95.8 95.9  PLT 157  --  148* 152 168   < > = values in this interval not displayed.    Cardiac Enzymes: Recent Labs  Lab 01/09/21 1641  CKTOTAL 676*    CBG: Recent Labs  Lab 01/13/21 1614 01/13/21 1947 01/13/21 2344 01/14/21 0347 01/14/21 0813  GLUCAP 126* 127* 99 112* 119*     Iron Studies: No results for input(s): IRON, TIBC, TRANSFERRIN, FERRITIN in the last 72 hours. Studies/Results: DG CHEST PORT 1 VIEW  Result Date: 01/13/2021 CLINICAL DATA:  Acute respiratory failure with hypoxia EXAM: PORTABLE CHEST 1 VIEW COMPARISON:  01/12/2021. FINDINGS: Endotracheal tube tip 3.8 cm above the carina. Enteric tube courses below the diaphragm in outside the field of view. Similar streaky right basilar opacities, compatible with atelectasis. No new consolidation. No visible pleural effusions or pneumothorax. Similar cardiomediastinal silhouette. IMPRESSION: Similar right basilar atelectasis.  No new acute abnormality. Electronically Signed    By: Margaretha Sheffield M.D.   On: 01/13/2021 07:21    Medications: Infusions:  sodium chloride 10 mL/hr at 01/14/21 0900   feeding supplement (VITAL 1.5 CAL) 1,000 mL (01/14/21 0259)    Scheduled Medications:  amLODipine  10 mg Per Tube Daily   carvedilol  25 mg Per Tube BID WC   chlorhexidine gluconate (MEDLINE KIT)  15 mL Mouth Rinse BID   Chlorhexidine Gluconate Cloth  6 each Topical Daily   feeding supplement (PROSource TF)  45 mL Per Tube BID   folic acid  1 mg Per Tube Daily   free water  200 mL Per Tube Q6H   hydrALAZINE  100 mg Per Tube Q8H   insulin aspart  0-6 Units Subcutaneous Q4H   mouth rinse  15 mL Mouth Rinse 10 times per day   multivitamin with minerals  1 tablet Per Tube Daily   pantoprazole sodium  40 mg Per Tube Daily   thiamine  100 mg Per Tube Daily    have reviewed scheduled and prn medications.  Physical Exam: General: Ill-looking male on mechanical ventilation, not responding.   Heart:RRR, s1s2 nl Lungs: Coarse breath sound bilateral Abdomen:soft, Non-tender, non-distended Extremities:No edema Neurology: Not responding and not following command Treasa Bradshaw Prasad Pessy Delamar 01/14/2021,10:09 AM  LOS: 8 days

## 2021-01-14 NOTE — Progress Notes (Signed)
Patient transported to CT and back without complications. RN at bedside.  

## 2021-01-14 NOTE — Progress Notes (Signed)
NAME:  JOHNTA MOOTHART, MRN:  [id number], DOB:  1965/10/19, LOS: 100 ADMISSION DATE:  01/24/2021, CONSULTATION DATE:  01/11/2021 REFERRING MD:  Curly [place], CHIEF COMPLAINT:  found down   History of Present Illness:  55 y/o male with multiple medical problems was found down by in a well call visit by police and was brought to [facility] with a diagnosis of pontine hemorrhage.  He was intubated for airway protection and then transferred to [facility] for further evaluation.  Pulmonary and critical care medicine was consulted for medical management in the setting of acute hemorrhagic stroke.  The patient was unable to provide history because he was intubated.  Significant Hospital Events: Including procedures, antibiotic start and stop dates in addition to other pertinent events   January 06, 2021 admission, intubation 10/13: remains intubated  Interim History / Subjective:   Remains critically ill, intubated Oral secretions persist Good urine output with Lasix Afebrile   Objective   Blood pressure 122/79, pulse 96, temperature 98.6 F (37 C), temperature source Axillary, resp. rate 19, height '6\' 2"'$  (1.88 m), weight 79.4 kg, SpO2 95 %.    Vent Mode: PSV;CPAP FiO2 (%):  [30 %-40 %] 30 % Set Rate:  [12 bmp] 12 bmp Vt Set:  [490 mL] 490 mL PEEP:  [5 cmH20] 5 cmH20 Pressure Support:  [10 [id number] cmH20] 12 cmH20 Plateau Pressure:  [14 cmH20-15 cmH20] 15 cmH20   Intake/Output Summary (Last 24 hours) at 01/14/2021 0904 Last data filed at 01/14/2021 0800 Gross per 24 hour  Intake 2375.34 ml  Output 2470 ml  Net -94.66 ml    Filed Weights   01/12/21 0439 01/13/21 0406 01/14/21 0445  Weight: 76 kg 79.2 kg 79.4 kg    Examination: General:  critically ill appearing on mech vent HEENT: MM pink/moist; eyes dry w/ proptosis with white drainage right eye worse than left; ETT in place Neuro: More sleepy, left hemiplegia, right side weak 3/5 CV: s1s2, NSR, no m/r/g PULM:  Decreased breath sounds bilateral, no accessory muscle use GI: soft, bsx4 active  Extremities: warm/dry, no edema  Skin: no rashes or lesions   Labs Normal electrolytes, decrease in creatinine to 5.7, stable anemia and mild thrombocytopenia, no leukocytosis  CXR: personally reviewed; 10/14, mild right basilar atelectasis  Resolved Hospital Problem list   Induced hypernatremia  Assessment & Plan:   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 uncontrolled hypertension Cerebral edema Acute encephalopathy due to hemorrhagic stroke P: -frequent neuro checks; limit sedating medications -prn tylenol for fever  Acute respiratory failure with hypoxemia acute hemorrhagic pontine stroke Probable aspiration pneumonia -treated with ceftriaxone for 5 days P:  -Spontaneous breathing trials -VAP prevention in place -Daily SBT/SAT -CPT TID   Acute kidney injury on Chronic kidney disease stage IV: Creatinine starting to decline  P: -continue lasix 80 scheduled -Trend BMP / urinary output -Continue free water 200 every 6 -Replace electrolytes as indicated -Avoid nephrotoxic agents, ensure adequate renal perfusion  Uncontrolled hypertension P: --continue amlodipine, hydralazine, coreg  Alcohol dependence P: -continue thiamine/folate -monitor for signs of withdrawal  Probable hyperthyroidism: tsh decreased; T4 normal Mild proptosis P: -eye drops prn -continue beta blocker  Summary -he has severe neurological deficits, neurological improvement has plateaued, has low tidal volumes on spontaneous breathing but may deserve a trial of extubation at some point before committing to tracheostomy   Best Practice (right click and "Reselect all SmartList Selections" daily)   Diet/type: tubefeeds DVT prophylaxis: SCD GI  prophylaxis: H2B Lines: N/A Foley:  Yes, and it is still needed Code Status:  full code Last date of multidisciplinary goals of care discussion  [per primary]    CRITICAL CARE Performed by: Leanna Sato Jowan Skillin   Total critical care time: 34 minutes   [facility] 01/14/2021, 9:04 AM  Please see [email or web address] for pager details.  From 7A-7P if no response, please call [phone]. After hours, please call ELink [phone].

## 2021-01-14 NOTE — Progress Notes (Signed)
STROKE TEAM PROGRESS NOTE   INTERVAL HISTORY RN at bedside, patient still intubated, however eyes open.  Right eye bulging, redness, conjunctive a projection with purulent discharge.  Able to follow simple commands.  Repeat CT stable hematoma, decreased IVH, no hydrocephalus.  Afebrile today.  Vitals:   01/14/21 1000 01/14/21 1100 01/14/21 1200 01/14/21 1322  BP: 108/68 113/73 123/85   Pulse: 84 87 92   Resp: (!) 25 (!) 29 (!) 24   Temp:   98.4 F (36.9 C)   TempSrc:   Axillary   SpO2: 95% 94% 94% 94%  Weight:      Height:       CBC:  Recent Labs  Lab 01/12/21 0927 01/13/21 0334 01/14/21 0300  WBC 9.6 9.2 8.4  NEUTROABS 7.0  --   --   HGB 8.2* 7.6* 7.7*  HCT 27.2* 25.0* 25.8*  MCV 96.1 95.8 95.9  PLT 148* 152 [id number]   Basic Metabolic Panel:  Recent Labs  Lab 01/10/21 1859 01/11/21 0224 01/12/21 1403 01/13/21 0334 01/14/21 0300  NA  --    < >  --  144 143  K  --    < > 4.7 4.5 4.7  CL  --    < >  --  115* 113*  CO2  --    < >  --  18* 17*  GLUCOSE  --    < >  --  122* 127*  BUN  --    < >  --  60* 71*  CREATININE  --    < >  --  6.09* 5.69*  CALCIUM  --    < >  --  9.9 10.2  MG 1.8  --  1.8  --   --   PHOS 4.3  --   --   --  4.6   < > = values in this interval not displayed.   Lipid Panel:  Recent Labs  Lab 01/08/21 0000 01/08/21 0817  CHOL 184  --   TRIG 71 76  HDL 95  --   CHOLHDL 1.9  --   VLDL 14  --   LDLCALC 75  --    HgbA1c:  No results for input(s): HGBA1C in the last 168 hours.  Urine Drug Screen:  No results for input(s): LABOPIA, COCAINSCRNUR, LABBENZ, AMPHETMU, THCU, LABBARB in the last 168 hours.  Alcohol Level  No results for input(s): ETH in the last 168 hours.   IMAGING past 24 hours No results found.   PHYSICAL EXAM  Temp:  [97.9 F (36.6 C)-98.6 F (37 C)] 98.4 F (36.9 C) (10/15 1200) Pulse Rate:  [83-96] 92 (10/15 1200) Resp:  [13-29] 24 (10/15 1200) BP: (102-128)/(68-86) 123/85 (10/15 1200) SpO2:  [88 %-97 %] 94 %  (10/15 1322) FiO2 (%):  [30 %-40 %] 30 % (10/15 1322) Weight:  [79.4 kg] 79.4 kg (10/15 0445)  General - Well nourished, well developed, intubated off sedation.  Ophthalmologic - fundi not visualized due to noncooperation.  Cardiovascular - Regular rate and rhythm.  Neuro - intubated off sedation, eyes open, right eye protrusion, projections of conjuctiva with pus secretions, able to follow simple commands. With eye opening, eyes in mid position, blinking to visual threat on the left eye, however, right eye not able to close, b/l eyes have horizontal gaze palsy, but able to have upward and downward gaze. Corneal reflex present on the left but not on the right, gag and cough present weakly. Breathing over the  vent.  Facial symmetry not able to test due to ET tube.  Tongue protrusion not cooperative. Able to move RUE and RLE barely at 3/5, but hemiplegia on the left. Left no babinski. Sensation, coordination not cooperative and gait not tested.    ASSESSMENT/PLAN Timothy Good is a 55 y.o. male with a past medical history significant for uncontrolled hypertension, CKD stage IV, heart failure, hyperlipidemia, daily drinking, medication nonadherence, iron deficiency anemia/anemia of chronic disease, presenting with a pontine hemorrhage. ICH Score: 3.    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  CT head right pontine hemorrhage with midbrain and medulla extension, IVH at fourth ventricle MRI  Acute brainstem hemorrhage centered at the pons. Extensive pontine edema, which tracks associated edema which tracks into the midbrain right > left and into the right medullary pyramid. Mild extension of hemorrhage into the 4th ventricle and basilar subarachnoid spaces. But no ventriculomegaly. No midline shift or impending herniation. Negative noncontrast MRI appearance of the orbits. CT repeat stable hematoma, no hydrocephalus Consider MRA to  rule out AVM or aneurysm once hematoma resolves. 2D Echo EF 55 to 60%.  LDL 75 HgbA1c 5.4 VTE prophylaxis - Heparin subcu Not on Anticoagulant or antiplatelet prior to admission, now on no antithrombotics due to [place] Therapy recommendations:  pending patient status  Disposition: Pending  Cerebral Edema Neurosurgery consulted, no acute intervention recommended at this time. Hypertonic saline infusion has been discontinued, allow sodium to trend down with daily check   Na 143  Acute respiratory failure Probable aspiration pneumonia Remains intubated, appreciate CCM management First on Unasyn->Rocephin->off Tmax 100.7->afebrile Resp culture: few gram positive cocci    Hypertensive emergency Home meds: not taking any prescribed meds >1 year  Stable now Keep systolic BP less than 0000000  Currently on Norvasc 10, Coreg 25, hydralazine 100 Q8 Long-term BP goal normotensive  Hyperlipidemia Home meds:  None LDL  75, almost at goal < 70 May consider statin at discharge  AKI on CKD Ischemic ATN Creatinine 3.65->3.80->-5.2->6.27->5.69 Hb 8.2->7.6->7.7 On tube feeding and free water  Urine adequate No acute need for dialysis Nephrology on board   ETOH use disorder with dependence Daily use of excessive liquor per family (?1/2 bottle vodka)  Thiamine, folate and MVI on board Monitor magnesium, 2.2->2.2->2.1 Vitamin B12 -213 Monitor s/s of withdrawal, need for CIWA   Dysphagia Continue tube feeding and free water  Speech on board CT Chest Abd Pelvis: generalized gastric wall thickening, with associated fat stranding in the gastrohepatic ligament.Findings are nonspecific and could be due to noninflammatory edema,gastritis, peptic ulcer disease or neoplasm.   Tobacco abuse Current smoker Smoking cessation counseling will be provided  Other Stroke Risk Factors Current Cigarette smoker, will be advised to stop smoking ETOH use disorder alcohol level <10, advised to drink no more  than 2 drink(s) a day  Other Active Problems Right eye not able to close -> conjunctival protection, redness -> purulent discharges -> Cipro eyedrop during the day and erythromycin ointment nightly  Hospital day # 8  This patient is critically ill due to pulmonary hemorrhage, respiratory failure, IVH, AKI on CKD, hypertensive emergency and at significant risk of neurological worsening, death form brain herniation, brain death, renal failure, hydrocephalus, hypertensive encephalopathy. This patient's care requires constant monitoring of vital signs, hemodynamics, respiratory and cardiac monitoring, review of multiple databases, neurological assessment, discussion with family, other specialists and medical decision making of high complexity. I spent 45 minutes of  neurocritical care time in the care of this patient.  I discussed with CCM Dr. Elsworth Soho.  Rosalin Hawking, MD PhD Stroke Neurology 01/15/2021 12:24 AM   To contact Stroke Continuity provider, please refer to [email or web address] After hours, contact General Neurology

## 2021-01-15 DIAGNOSIS — J9601 Acute respiratory failure with hypoxia: Secondary | ICD-10-CM | POA: Diagnosis not present

## 2021-01-15 DIAGNOSIS — Z978 Presence of other specified devices: Secondary | ICD-10-CM | POA: Diagnosis not present

## 2021-01-15 DIAGNOSIS — I161 Hypertensive emergency: Secondary | ICD-10-CM | POA: Diagnosis not present

## 2021-01-15 DIAGNOSIS — I615 Nontraumatic intracerebral hemorrhage, intraventricular: Principal | ICD-10-CM

## 2021-01-15 DIAGNOSIS — I613 Nontraumatic intracerebral hemorrhage in brain stem: Secondary | ICD-10-CM | POA: Diagnosis not present

## 2021-01-15 DIAGNOSIS — N179 Acute kidney failure, unspecified: Secondary | ICD-10-CM | POA: Diagnosis not present

## 2021-01-15 LAB — RENAL FUNCTION PANEL
Albumin: 1.8 g/dL — ABNORMAL LOW (ref 3.5–5.0)
Anion gap: 13 (ref 5–15)
BUN: 80 mg/dL — ABNORMAL HIGH (ref 6–20)
CO2: 18 mmol/L — ABNORMAL LOW (ref 22–32)
Calcium: 10.2 mg/dL (ref 8.9–10.3)
Chloride: 112 mmol/L — ABNORMAL HIGH (ref 98–111)
Creatinine, Ser: 5.46 mg/dL — ABNORMAL HIGH (ref 0.61–1.24)
GFR, Estimated: 12 mL/min — ABNORMAL LOW (ref >60)
Glucose, Bld: 119 mg/dL — ABNORMAL HIGH (ref 70–99)
Phosphorus: 4.8 mg/dL — ABNORMAL HIGH (ref 2.5–4.6)
Potassium: 4.6 mmol/L (ref 3.5–5.1)
Sodium: 143 mmol/L (ref 135–145)

## 2021-01-15 LAB — CBC
HCT: 23.4 % — ABNORMAL LOW (ref 39.0–52.0)
Hemoglobin: 7.3 g/dL — ABNORMAL LOW (ref 13.0–17.0)
MCH: 29.3 pg (ref 26.0–34.0)
MCHC: 31.2 g/dL (ref 30.0–36.0)
MCV: 94 fL (ref 80.0–100.0)
Platelets: 215 10*3/uL (ref 150–400)
RBC: 2.49 MIL/uL — ABNORMAL LOW (ref 4.22–5.81)
RDW: 14.7 % (ref 11.5–15.5)
WBC: 8.9 10*3/uL (ref 4.0–10.5)
nRBC: 0 % (ref 0.0–0.2)

## 2021-01-15 LAB — GLUCOSE, CAPILLARY
Glucose-Capillary: 112 mg/dL — ABNORMAL HIGH (ref 70–99)
Glucose-Capillary: 113 mg/dL — ABNORMAL HIGH (ref 70–99)
Glucose-Capillary: 118 mg/dL — ABNORMAL HIGH (ref 70–99)
Glucose-Capillary: 118 mg/dL — ABNORMAL HIGH (ref 70–99)
Glucose-Capillary: 122 mg/dL — ABNORMAL HIGH (ref 70–99)
Glucose-Capillary: 93 mg/dL (ref 70–99)

## 2021-01-15 MED ORDER — POLYVINYL ALCOHOL 1.4 % OP SOLN
1.0000 [drp] | OPHTHALMIC | Status: DC | PRN
  Administered 2021-01-18: 1 [drp] via OPHTHALMIC
  Administered 2021-01-29: 2 [drp] via OPHTHALMIC
  Filled 2021-01-15: qty 15

## 2021-01-15 MED ORDER — ARTIFICIAL TEARS OPHTHALMIC OINT
TOPICAL_OINTMENT | OPHTHALMIC | Status: DC
  Administered 2021-01-15 (×3): 1 via OPHTHALMIC
  Filled 2021-01-15 (×2): qty 3.5

## 2021-01-15 MED ORDER — HEPARIN SODIUM (PORCINE) 5000 UNIT/ML IJ SOLN
5000.0000 [IU] | Freq: Three times a day (TID) | INTRAMUSCULAR | Status: DC
  Administered 2021-01-15 – 2021-01-28 (×36): 5000 [IU] via SUBCUTANEOUS
  Filled 2021-01-15 (×36): qty 1

## 2021-01-15 NOTE — Progress Notes (Signed)
STROKE TEAM PROGRESS NOTE   INTERVAL HISTORY RN at bedside, patient still intubated, eyes easily open on voice.  Right eye still bulging, redness, conjunctive a projection with purulent discharge.  Continue to follow simple commands with movement on the right. Discussed with Dr. Elsworth Soho, he may need trach and PEG.   Vitals:   01/15/21 1048 01/15/21 1058 01/15/21 1100 01/15/21 1102  BP: 114/67 114/67 122/74   Pulse:  100 91   Resp:  (!) 28 (!) 37   Temp:      TempSrc:      SpO2:  100% 98% 97%  Weight:      Height:       CBC:  Recent Labs  Lab 01/12/21 0927 01/13/21 0334 01/14/21 0300 01/15/21 0256  WBC 9.6   < > 8.4 8.9  NEUTROABS 7.0  --   --   --   HGB 8.2*   < > 7.7* 7.3*  HCT 27.2*   < > 25.8* 23.4*  MCV 96.1   < > 95.9 94.0  PLT 148*   < > 168 215   < > = values in this interval not displayed.   Basic Metabolic Panel:  Recent Labs  Lab 01/10/21 1859 01/11/21 0224 01/12/21 1403 01/13/21 0334 01/14/21 0300 01/15/21 0256  NA  --    < >  --    < > 143 143  K  --    < > 4.7   < > 4.7 4.6  CL  --    < >  --    < > 113* 112*  CO2  --    < >  --    < > 17* 18*  GLUCOSE  --    < >  --    < > 127* 119*  BUN  --    < >  --    < > 71* 80*  CREATININE  --    < >  --    < > 5.69* 5.46*  CALCIUM  --    < >  --    < > 10.2 10.2  MG 1.8  --  1.8  --   --   --   PHOS 4.3  --   --   --  4.6 4.8*   < > = values in this interval not displayed.   Lipid Panel:  No results for input(s): CHOL, TRIG, HDL, CHOLHDL, VLDL, LDLCALC in the last 168 hours.  HgbA1c:  No results for input(s): HGBA1C in the last 168 hours.  Urine Drug Screen:  No results for input(s): LABOPIA, COCAINSCRNUR, LABBENZ, AMPHETMU, THCU, LABBARB in the last 168 hours.  Alcohol Level  No results for input(s): ETH in the last 168 hours.   IMAGING past 24 hours CT HEAD WO CONTRAST (5MM)  Result Date: 01/14/2021 CLINICAL DATA:  Stroke, follow up EXAM: CT HEAD WITHOUT CONTRAST TECHNIQUE: Contiguous axial  images were obtained from the base of the skull through the vertex without intravenous contrast. COMPARISON:  CT head January 07, 2021. FINDINGS: Brain: When accounting for differences in technique (different plane of imaging on axial imaging), similar size an intraparenchymal hemorrhage within the pons, measuring up to approximately 2.0 by 3.2 by 2.6 cm (similar when remeasured). Surrounding edema in the brainstem appears similar. Intraventricular hemorrhage in the fourth ventricle is decreased. Similar effacement of the fourth ventricle without progressive ventriculomegaly to suggest obstructive hydrocephalus. No evidence of interval acute large vascular territory infarct. Patchy white matter hypoattenuation,  compatible with advanced chronic microvascular ischemic disease that appears similar to prior. No midline shift. Similar mild cerebral atrophy. Vascular: No hyperdense vessel identified. Skull: No acute fracture. Sinuses/Orbits: Mucosal thickening of scattered paranasal sinuses, similar. No acute orbital findings. Other: Small bilateral mastoid effusions. IMPRESSION: 1. When accounting for differences in technique, similar size of a recent intraparenchymal hemorrhage within the pons, as detailed above. Similar surrounding brainstem edema. Intraventricular hemorrhage in the fourth ventricle is decreased. Similar effacement of the fourth ventricle without progressive ventriculomegaly to suggest obstructive hydrocephalus. 2. Similar advanced chronic microvascular ischemic disease. Electronically Signed   By: Margaretha Sheffield M.D.   On: 01/14/2021 16:24     PHYSICAL EXAM  Temp:  [96.9 F (36.1 C)-98.4 F (36.9 C)] 98.4 F (36.9 C) (10/16 0739) Pulse Rate:  [80-102] 91 (10/16 1100) Resp:  [18-37] 37 (10/16 1100) BP: (108-148)/(67-89) 122/74 (10/16 1100) SpO2:  [88 %-100 %] 97 % (10/16 1102) FiO2 (%):  [30 %] 30 % (10/16 1058) Weight:  [80.1 kg] 80.1 kg (10/16 0332)  General - Well nourished, well  developed, intubated off sedation.  Ophthalmologic - fundi not visualized due to noncooperation.  Cardiovascular - Regular rate and rhythm.  Neuro - intubated off sedation, eyes open, right eye protrusion, projections of conjuctiva with pus secretions, able to follow simple commands. With eye opening, eyes in mid position, blinking to visual threat on the left eye, however, right eye not able to close, b/l eyes have horizontal gaze palsy, but able to have upward and downward gaze. Corneal reflex present on the left but not on the right, gag and cough present weakly. Breathing over the vent.  Facial symmetry not able to test due to ET tube.  Tongue protrusion not cooperative. Able to move RUE and RLE barely at 3/5, but hemiplegia on the left. Left no babinski. Sensation, coordination not cooperative and gait not tested.    ASSESSMENT/PLAN Timothy Good is a 55 y.o. male with a past medical history significant for uncontrolled hypertension, CKD stage IV, heart failure, hyperlipidemia, daily drinking, medication nonadherence, iron deficiency anemia/anemia of chronic disease, presenting with a pontine hemorrhage. ICH Score: 3.    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  CT head right pontine hemorrhage with midbrain and medulla extension, IVH at fourth ventricle MRI  Acute brainstem hemorrhage centered at the pons. Extensive pontine edema, which tracks associated edema which tracks into the midbrain right > left and into the right medullary pyramid. Mild extension of hemorrhage into the 4th ventricle and basilar subarachnoid spaces. But no ventriculomegaly. No midline shift or impending herniation. Negative noncontrast MRI appearance of the orbits. CT repeat stable hematoma, no hydrocephalus Consider MRA to rule out AVM or aneurysm once hematoma resolves. 2D Echo EF 55 to 60%.  LDL 75 HgbA1c 5.4 VTE prophylaxis - Heparin subcu Not  on Anticoagulant or antiplatelet prior to admission, now on no antithrombotics due to [place] Therapy recommendations:  pending patient status  Disposition: Pending  Cerebral Edema Neurosurgery consulted, no acute intervention recommended at this time. Hypertonic saline infusion has been discontinued, allow sodium to trend down with daily check   Na 143  Acute respiratory failure Probable aspiration pneumonia Remains intubated, appreciate CCM management First on Unasyn->Rocephin->off Tmax 100.7->afebrile now Resp culture: few gram positive cocci    Hypertensive emergency Home meds: none Stable now BP goal less than 160  Currently on Norvasc 10, Coreg 25, hydralazine 100 Q8 Long-term BP goal  normotensive  Hyperlipidemia Home meds:  None LDL  75, almost at goal < 70 May consider statin at discharge  AKI on CKD Ischemic ATN Creatinine 3.65->3.80->-5.2->6.27->5.69->5.46 Hb 8.2->7.6->7.7->7.3 On tube feeding and free water  Urine adequate No acute need for dialysis Nephrology on board   Right eye swelling with infection Likely right peripheral CN VII palsy with difficulty eye closure conjunctival protection, redness with purulent discharges  Cipro eyedrop during the day and erythromycin ointment nightly Ophthalmology consult - appreciate Dr. Roda Shutters help  ETOH use disorder with dependence Daily use of excessive liquor per family (?1/2 bottle vodka)  Thiamine, folate and MVI on board Monitor magnesium, 2.2->2.2->2.1 Vitamin B12 -213 Monitor s/s of withdrawal, need for CIWA   Dysphagia Continue tube feeding and free water  Speech on board CT Chest Abd Pelvis: generalized gastric wall thickening, with associated fat stranding in the gastrohepatic ligament.Findings are nonspecific and could be due to noninflammatory edema,gastritis, peptic ulcer disease or neoplasm.   Tobacco abuse Current smoker Smoking cessation counseling will be provided  Other Stroke Risk  Factors Current Cigarette smoker, will be advised to stop smoking ETOH use disorder alcohol level <10, advised to drink no more than 2 drink(s) a day  Other Active Problems   Hospital day # 9  This patient is critically ill due to pontine ICH, respiratory failure, IVH, AKI on CKD, hypertensive emergency, right eye swelling and infected and at significant risk of neurological worsening, death form brain herniation, brain death, renal failure, right eye blind, hydrocephalus. This patient's care requires constant monitoring of vital signs, hemodynamics, respiratory and cardiac monitoring, review of multiple databases, neurological assessment, discussion with family, other specialists and medical decision making of high complexity. I spent 35 minutes of neurocritical care time in the care of this patient. I discussed with Dr. Elsworth Soho.   Rosalin Hawking, MD PhD Stroke Neurology 01/15/2021 11:33 AM   To contact Stroke Continuity provider, please refer to [email or web address] After hours, contact General Neurology

## 2021-01-15 NOTE — Progress Notes (Signed)
[facility] NEPHROLOGY PROGRESS NOTE  Assessment/ Plan:  # Acute kidney injury on chronic kidney disease stage III-IV: Underlying chronic kidney disease from hypertension (followed by Dr. Theador Hawthorne in [place]) and his acute kidney injury appears to be from ischemic ATN.  Received IV Lasix with albumin on 10/13.   Urine output is acceptable and creatinine level trending down gradually.  No acute need for dialysis.  We will continue daily lab, strict ins and outs and watch for renal recovery.    # Hypokalemia: Potassium level in acceptable range today.  # Hypertension: Blood pressures currently well controlled on a combination of amlodipine, carvedilol and hydralazine.  Continue to hold ACE inhibitor and monitor with diuretic trial.  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  #  Hypernatremia: Medically induced/iatrogenic for management of cerebral edema.  Sodium level normal now.  #Metabolic acidosis: Started sodium bicarbonate.  # Acute hypoxic respiratory failure/VDRF: Treated with ceftriaxone.  Vent managed by pulmonary team.  Subjective: Seen and examined.  No new event.  Urine output is recorded 1.1 L.  Remains intubated and not responsive.  Discussed with the patient's nurse.  Objective Vital signs in last 24 hours: Vitals:   01/15/21 0800 01/15/21 0859 01/15/21 0900 01/15/21 1000  BP: 124/78 124/78 118/79 115/68  Pulse: (!) 102 99 99 92  Resp: (!) 27  (!) 27 (!) 31  Temp:      TempSrc:      SpO2: (!) 88%  94% 94%  Weight:      Height:       Weight change: 0.7 kg  Intake/Output Summary (Last 24 hours) at 01/15/2021 1015 Last data filed at 01/15/2021 1000 Gross per 24 hour  Intake 2399.02 ml  Output 1575 ml  Net 824.02 ml        Labs: Basic Metabolic Panel: Recent Labs  Lab 01/10/21 1859 01/11/21 0224  01/13/21 0334 01/14/21 0300 01/15/21 0256  NA  --    < > 144 143 143  K  --    < > 4.5 4.7 4.6  CL  --    < > 115* 113* 112*  CO2  --    < > 18* 17* 18*  GLUCOSE  --    < > 122* 127* 119*  BUN  --    < > 60* 71* 80*  CREATININE  --    < > 6.09* 5.69* 5.46*  CALCIUM  --    < > 9.9 10.2 10.2  PHOS 4.3  --   --  4.6 4.8*   < > = values in this interval not displayed.    Liver Function Tests: Recent Labs  Lab 01/14/21 0300 01/15/21 0256  ALBUMIN 2.0* 1.8*    No results for input(s): LIPASE, AMYLASE in the last 168 hours.  No results for input(s): AMMONIA in the last 168 hours. CBC: Recent Labs  Lab 01/12/21 0927 01/13/21 0334 01/14/21 0300 01/15/21 0256  WBC 9.6 9.2 8.4 8.9  NEUTROABS 7.0  --   --   --   HGB 8.2* 7.6* 7.7* 7.3*  HCT 27.2* 25.0* 25.8* 23.4*  MCV 96.1 95.8 95.9 94.0  PLT 148* 152 168 215    Cardiac Enzymes: Recent Labs  Lab 01/09/21 1641  CKTOTAL 676*    CBG: Recent Labs  Lab 01/14/21 1635 01/14/21 2037 01/14/21 2329 01/15/21 0515 01/15/21 0830  GLUCAP 120* 125* 131* 112*  118*     Iron Studies: No results for input(s): IRON, TIBC, TRANSFERRIN, FERRITIN in the last 72 hours. Studies/Results: CT HEAD WO CONTRAST (5MM)  Result Date: 01/14/2021 CLINICAL DATA:  Stroke, follow up EXAM: CT HEAD WITHOUT CONTRAST TECHNIQUE: Contiguous axial images were obtained from the base of the skull through the vertex without intravenous contrast. COMPARISON:  CT head January 07, 2021. FINDINGS: Brain: When accounting for differences in technique (different plane of imaging on axial imaging), similar size an intraparenchymal hemorrhage within the pons, measuring up to approximately 2.0 by 3.2 by 2.6 cm (similar when remeasured). Surrounding edema in the brainstem appears similar. Intraventricular hemorrhage in the fourth ventricle is decreased. Similar effacement of the fourth ventricle without progressive ventriculomegaly to suggest obstructive hydrocephalus.  No evidence of interval acute large vascular territory infarct. Patchy white matter hypoattenuation, compatible with advanced chronic microvascular ischemic disease that appears similar to prior. No midline shift. Similar mild cerebral atrophy. Vascular: No hyperdense vessel identified. Skull: No acute fracture. Sinuses/Orbits: Mucosal thickening of scattered paranasal sinuses, similar. No acute orbital findings. Other: Small bilateral mastoid effusions. IMPRESSION: 1. When accounting for differences in technique, similar size of a recent intraparenchymal hemorrhage within the pons, as detailed above. Similar surrounding brainstem edema. Intraventricular hemorrhage in the fourth ventricle is decreased. Similar effacement of the fourth ventricle without progressive ventriculomegaly to suggest obstructive hydrocephalus. 2. Similar advanced chronic microvascular ischemic disease. Electronically Signed   By: Margaretha Sheffield M.D.   On: 01/14/2021 16:24    Medications: Infusions:  sodium chloride Stopped (01/14/21 1454)   feeding supplement (VITAL 1.5 CAL) 60 mL/hr at 01/14/21 1500    Scheduled Medications:  amLODipine  10 mg Per Tube Daily   carvedilol  25 mg Per Tube BID WC   chlorhexidine gluconate (MEDLINE KIT)  15 mL Mouth Rinse BID   Chlorhexidine Gluconate Cloth  6 each Topical Daily   ciprofloxacin  2 drop Right Eye Q4H while awake   erythromycin   Right Eye QHS   feeding supplement (PROSource TF)  45 mL Per Tube BID   folic acid  1 mg Per Tube Daily   free water  200 mL Per Tube Q6H   heparin injection (subcutaneous)  5,000 Units Subcutaneous Q8H   hydrALAZINE  100 mg Per Tube Q8H   insulin aspart  0-6 Units Subcutaneous Q4H   mouth rinse  15 mL Mouth Rinse 10 times per day   multivitamin with minerals  1 tablet Per Tube Daily   pantoprazole sodium  40 mg Per Tube Daily   sodium bicarbonate  650 mg Per Tube TID   thiamine  100 mg Per Tube Daily    have reviewed scheduled and prn  medications.  Physical Exam: General: Ill-looking male on mechanical ventilation, not responding.   Heart:RRR, s1s2 nl Lungs: Coarse breath sound bilateral. Abdomen:soft, Non-tender, non-distended Extremities:No edema Neurology: Not responding and not following command Jamonte Curfman Prasad Chloie Loney 01/15/2021,10:15 AM  LOS: 9 days

## 2021-01-15 NOTE — Plan of Care (Signed)
  Problem: Nutrition: Goal: Dietary intake will improve Outcome: Progressing   Problem: Elimination: Goal: Will not experience complications related to bowel motility Outcome: Progressing   Problem: Skin Integrity: Goal: Risk for impaired skin integrity will decrease Outcome: Progressing   Problem: Health Behavior/Discharge Planning: Goal: Ability to manage health-related needs will improve Outcome: Not Progressing   Problem: Safety: Goal: Non-violent Restraint(s) Outcome: Completed/Met

## 2021-01-15 NOTE — Progress Notes (Signed)
NAME:  Timothy Good, MRN:  [id number], DOB:  Nov 07, 1965, LOS: 9 ADMISSION DATE:  01/28/2021, CONSULTATION DATE:  01/01/2021 REFERRING MD:  Curly [place], CHIEF COMPLAINT:  found down   History of Present Illness:  55 y/o male with multiple medical problems was found down by in a well call visit by police and was brought to [facility] with a diagnosis of pontine hemorrhage.  He was intubated for airway protection and then transferred to [facility] for further evaluation.  Pulmonary and critical care medicine was consulted for medical management in the setting of acute hemorrhagic stroke.     PMH - CKD -4  Significant Hospital Events: Including procedures, antibiotic start and stop dates in addition to other pertinent events   January 06, 2021 admission, intubation 10/8 MRI orbits appear normal 10/15 head CT no change in pontine hematoma, IVH decreased, no hydrocephalus  Interim History / Subjective:   Remains critically ill, intubated Been swollen pressure support Copious oral secretions Afebrile Good urine output with Lasix   Objective   Blood pressure 123/76, pulse (!) 102, temperature 98.2 F (36.8 C), temperature source Axillary, resp. rate (!) 30, height '6\' 2"'$  (1.88 m), weight 80.1 kg, SpO2 94 %.    Vent Mode: PSV;CPAP FiO2 (%):  [30 %] 30 % Set Rate:  [12 bmp] 12 bmp Vt Set:  [490 mL] 490 mL PEEP:  [5 cmH20] 5 cmH20 Pressure Support:  [8 cmH20] 8 cmH20 Plateau Pressure:  [16 cmH20] 16 cmH20   Intake/Output Summary (Last 24 hours) at 01/15/2021 [id number] Last data filed at 01/15/2021 0700 Gross per 24 hour  Intake 2480.71 ml  Output 1410 ml  Net 1070.71 ml    Filed Weights   01/13/21 0406 01/14/21 0445 01/15/21 0332  Weight: 79.2 kg 79.4 kg 80.1 kg    Examination: General:  critically ill appearing on mech vent HEENT: MM pink/moist; right eye appears red with stable corneal opacity, bilateral proptosis right more than left ; ETT in place Neuro: Awake  follows commands on right left hemiplegia, right side weak 3/5 CV: s1s2, NSR, no m/r/g PULM: No accessory muscle use, clear breath sounds bilateral GI: soft, bsx4 active  Extremities: warm/dry, no edema  Skin: no rashes or lesions   Labs Stable creatinine, slight rising BUN, stable anemia, low albumin  CXR: personally reviewed; 10/14, mild right basilar atelectasis  Resolved Hospital Problem list   Induced hypernatremia Acute encephalopathy  Assessment & Plan:   Acute pontine hemorrhage with IVH likely in the setting of uncontrolled hypertension  P: -frequent neuro checks; limit sedating medications -prn tylenol for fever  Acute respiratory failure with hypoxemia acute hemorrhagic pontine stroke Probable aspiration pneumonia -treated with ceftriaxone for 5 days P:  -Tolerates spontaneous breathing trials , not certain he can protect airway but only way to tell would be with a trial of extubation -VAP prevention in place -Daily SBT/SAT -CPT TID   Acute kidney injury on Chronic kidney disease stage IV: Creatinine stable  P: -continue lasix 80 scheduled , BUN rising -Trend BMP / urinary output -Continue free water 200 every 6 -Replace electrolytes as indicated -Avoid nephrotoxic agents, ensure adequate renal perfusion  Uncontrolled hypertension P: --continue amlodipine, hydralazine, coreg  Alcohol dependence P: -continue thiamine/folate -monitor for signs of withdrawal  Probable hyperthyroidism: tsh decreased; T4 normal Mild proptosis P: -Cipro and erythromycin eye drops prn  Trial of removing Foley with Urecholine  Summary -he has severe neurological deficits, neurological improvement has plateaued, tolerates spontaneous breathing  but may deserve a trial of extubation at some point before committing to tracheostomy   Best Practice (right click and "Reselect all SmartList Selections" daily)   Diet/type: tubefeeds DVT prophylaxis: SCD GI prophylaxis:  H2B Lines: N/A Foley:  Yes, and it is still needed Code Status:  full code Last date of multidisciplinary goals of care discussion [per primary]    CRITICAL CARE Performed by: Leanna Sato Boruch Manuele   Total critical care time: 32 minutes   [facility] 01/15/2021, 8:28 AM  Please see [email or web address] for pager details.  From 7A-7P if no response, please call [phone]. After hours, please call ELink [phone].

## 2021-01-15 NOTE — Consult Note (Addendum)
Chief Complaint/Reason for Consultation: Exposure OD and discharge  HPI: 55 yo admitted with pontine hemorrhage and uncontrolled HTN noted to have peripheral facial palsy and inability to close OD with worsening exposure and discharge.   ROS: unable to obtain  Patient Active Problem List   Diagnosis Date Noted   Endotracheally intubated    Acute respiratory failure with hypoxia ([place])    Hypertension    Malnutrition of moderate degree 01/10/2021   Pontine hemorrhage ([place]) 01/16/2021   Protein in urine    Hypertensive crisis 01/23/2012   Acute on chronic combined systolic and diastolic heart failure ([place]) 01/23/2012   Acute on chronic renal failure ([place]) 01/23/2012   Noncompliance with medication regimen 01/23/2012    No current facility-administered medications on file prior to encounter.   Current Outpatient Medications on File Prior to Encounter  Medication Sig Dispense Refill   amLODipine (NORVASC) 10 MG tablet Take 1 tablet (10 mg total) by mouth daily. (Patient not taking: No sig reported) 30 tablet 6   carvedilol (COREG) 6.25 MG tablet Take 1 tablet (6.25 mg total) by mouth 2 (two) times daily with a meal. (Patient not taking: No sig reported) 60 tablet 6   ferrous fumarate (HEMOCYTE - 106 MG FE) 325 (106 FE) MG TABS Take 1 tablet (106 mg of iron total) by mouth daily. (Patient not taking: No sig reported) 30 each 3   furosemide (LASIX) 40 MG tablet Take 1 tablet (40 mg total) by mouth 2 (two) times daily. (Patient not taking: No sig reported) 60 tablet 6   hydrALAZINE (APRESOLINE) 50 MG tablet Take 1 tablet (50 mg total) by mouth every 8 (eight) hours. (Patient not taking: No sig reported) 90 tablet 6   isosorbide mononitrate (IMDUR) 60 MG 24 hr tablet Take 1 tablet (60 mg total) by mouth daily. (Patient not taking: No sig reported) 90 tablet 3   lisinopril (PRINIVIL,ZESTRIL) 20 MG tablet Take 1 tablet (20 mg total) by mouth daily. (Patient not taking: No sig reported) 30 tablet 6    lovastatin (MEVACOR) 20 MG tablet Take 20 mg by mouth at bedtime. (Patient not taking: No sig reported)      Allergies  Allergen Reactions   Penicillins Other (See Comments)    Lost mobility in legs for a week.       EXAMINATION  VAsc (limited by inability to speak) When asked to move his finger if he can see my hand motion, he follows this command and is able to see hand motion OU with each eye isolated separately.  Pupils:  OD: Miotic, Equal, round, reactive, no APD OS: Miotic, Equal, round, reactive, no APD  T(Pen): OD:  17  mm Hg OS:  12  mm Hg  CVF: unable to obtain EOM: bilateral horizontal gaze palsy, supraduction and infraduction intact OU  Anterior segment penlight/20D with indirect Exam: Ext/Lids: floppy eyelids OU, R facial palsy with poor orbicularis function and 5-21m lagophthalmos, poor bells reflex OD. No significant proptosis noted OU Conj/Sclera: OD 2-3+ chemosis mostly temporal, diffuse moderate subconj hemorrhage, OS white and quiet  Cornea: OD large inferior epithelial defect (approximately 640mvertical by 78m73morizontal) and inferior keratinization of the cornea, no significant infiltrates or thinning appreciated, OS clear without abrasion AC: Deep, well-formed and Quiet OU Iris: Round and Flat OU Lens: Clear OU  Dilated OU with phenylephrine and tropicamide OU 1405 hours  Dilated Fundus Exam: View: hazy OD, clear OS Vitreous: Clear OU Disc: sharp and pink with 0.3 c/d  OD, 0.2 c/d OS Macula: flat and dry OU, details not visible OD Vessels: normal distribution OU, perfused OU Periphery: flat and attached 360 without breaks or tears OU, limited views due to positioning and horizontal gaze palsy   Imp/Plan:  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 Ophthalmology [facility]

## 2021-01-16 DIAGNOSIS — I161 Hypertensive emergency: Secondary | ICD-10-CM | POA: Diagnosis not present

## 2021-01-16 DIAGNOSIS — N179 Acute kidney failure, unspecified: Secondary | ICD-10-CM | POA: Diagnosis not present

## 2021-01-16 DIAGNOSIS — I613 Nontraumatic intracerebral hemorrhage in brain stem: Secondary | ICD-10-CM | POA: Diagnosis not present

## 2021-01-16 DIAGNOSIS — J9601 Acute respiratory failure with hypoxia: Secondary | ICD-10-CM | POA: Diagnosis not present

## 2021-01-16 LAB — RENAL FUNCTION PANEL
Albumin: 1.8 g/dL — ABNORMAL LOW (ref 3.5–5.0)
Anion gap: 12 (ref 5–15)
BUN: 92 mg/dL — ABNORMAL HIGH (ref 6–20)
CO2: 20 mmol/L — ABNORMAL LOW (ref 22–32)
Calcium: 10.4 mg/dL — ABNORMAL HIGH (ref 8.9–10.3)
Chloride: 109 mmol/L (ref 98–111)
Creatinine, Ser: 5.44 mg/dL — ABNORMAL HIGH (ref 0.61–1.24)
GFR, Estimated: 12 mL/min — ABNORMAL LOW (ref >60)
Glucose, Bld: 127 mg/dL — ABNORMAL HIGH (ref 70–99)
Phosphorus: 4.6 mg/dL (ref 2.5–4.6)
Potassium: 4.9 mmol/L (ref 3.5–5.1)
Sodium: 141 mmol/L (ref 135–145)

## 2021-01-16 LAB — CBC
HCT: 22.6 % — ABNORMAL LOW (ref 39.0–52.0)
Hemoglobin: 7.1 g/dL — ABNORMAL LOW (ref 13.0–17.0)
MCH: 29.1 pg (ref 26.0–34.0)
MCHC: 31.4 g/dL (ref 30.0–36.0)
MCV: 92.6 fL (ref 80.0–100.0)
Platelets: 240 10*3/uL (ref 150–400)
RBC: 2.44 MIL/uL — ABNORMAL LOW (ref 4.22–5.81)
RDW: 14.8 % (ref 11.5–15.5)
WBC: 12.6 10*3/uL — ABNORMAL HIGH (ref 4.0–10.5)
nRBC: 0 % (ref 0.0–0.2)

## 2021-01-16 LAB — GLUCOSE, CAPILLARY
Glucose-Capillary: 115 mg/dL — ABNORMAL HIGH (ref 70–99)
Glucose-Capillary: 116 mg/dL — ABNORMAL HIGH (ref 70–99)
Glucose-Capillary: 117 mg/dL — ABNORMAL HIGH (ref 70–99)
Glucose-Capillary: 117 mg/dL — ABNORMAL HIGH (ref 70–99)
Glucose-Capillary: 119 mg/dL — ABNORMAL HIGH (ref 70–99)
Glucose-Capillary: 98 mg/dL (ref 70–99)

## 2021-01-16 MED ORDER — PROPOFOL 10 MG/ML IV BOLUS: 50.0000 mg | Freq: Once | INTRAVENOUS | Status: DC

## 2021-01-16 MED ORDER — FENTANYL CITRATE PF 50 MCG/ML IJ SOSY: 200.0000 ug | PREFILLED_SYRINGE | Freq: Once | INTRAMUSCULAR | Status: DC

## 2021-01-16 MED ORDER — VECURONIUM BROMIDE 10 MG IV SOLR
10.0000 mg | Freq: Once | INTRAVENOUS | Status: DC
Start: 2021-01-17 — End: 2021-01-23
  Filled 2021-01-16: qty 10

## 2021-01-16 MED ORDER — NUTRISOURCE FIBER PO PACK
1.0000 | PACK | Freq: Two times a day (BID) | ORAL | Status: DC
  Administered 2021-01-16 – 2021-01-20 (×9): 1
  Filled 2021-01-16 (×10): qty 1

## 2021-01-16 MED ORDER — MIDAZOLAM HCL 2 MG/2ML IJ SOLN
5.0000 mg | Freq: Once | INTRAMUSCULAR | Status: DC
  Filled 2021-01-16: qty 6

## 2021-01-16 NOTE — Progress Notes (Signed)
NAME:  Timothy Good, MRN:  [id number], DOB:  [birth year], LOS: 74 ADMISSION DATE:  01/02/2021, CONSULTATION DATE:  01/19/2021 REFERRING MD:  Curly [place], CHIEF COMPLAINT:  found down   History of Present Illness:  55 y/o male with multiple medical problems was found down by in a well call visit by police and was brought to [facility] with a diagnosis of pontine hemorrhage.  He was intubated for airway protection and then transferred to [facility] for further evaluation.  Pulmonary and critical care medicine was consulted for medical management in the setting of acute hemorrhagic stroke.     PMH - CKD -4  Significant Hospital Events: Including procedures, antibiotic start and stop dates in addition to other pertinent events   January 06, 2021 admission, intubation 10/8 MRI orbits appear normal 10/15 head CT no change in pontine hematoma, IVH decreased, no hydrocephalus  Interim History / Subjective:   Seen by ophthalmology for swollen right eye.  Following commands.  Copious oral secretions with no visible spontaneous swallowing effort.   Objective   Blood pressure 125/82, pulse 96, temperature 98.7 F (37.1 C), temperature source Axillary, resp. rate (!) 25, height '6\' 2"'$  (1.88 m), weight 74.2 kg, SpO2 94 %.    Vent Mode: PSV;CPAP FiO2 (%):  [30 %] 30 % Set Rate:  [12 bmp] 12 bmp Vt Set:  [490 mL] 490 mL PEEP:  [5 cmH20] 5 cmH20 Pressure Support:  [5 [id number] cmH20] 12 cmH20 Plateau Pressure:  [14 cmH20-16 cmH20] 16 cmH20   Intake/Output Summary (Last 24 hours) at 01/16/2021 1023 Last data filed at 01/16/2021 0700 Gross per 24 hour  Intake 2060 ml  Output 1335 ml  Net 725 ml    Filed Weights   01/14/21 0445 01/15/21 0332 01/15/21 1845  Weight: 79.4 kg 80.1 kg 74.2 kg    Examination: General:  critically ill appearing on mech vent HEENT: MM pink/moist; right eye appears red with stable corneal opacity, bilateral proptosis right more than left, eye patch in  place.; ETT in place Neuro: Awake follows commands on right left hemiplegia, right side weak 3/5 CV: s1s2, NSR, no m/r/g PULM: No accessory muscle use, clear breath sounds bilateral GI: soft, bsx4 active  Extremities: warm/dry, no edema  Skin: no rashes or lesions     Resolved Hospital Problem list   Induced hypernatremia Acute encephalopathy  Assessment & Plan:   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  Unlikely to have intact swallowing given pons and medullary involvement.  Plan:  -We will discuss tracheostomy and PEG tube with family as bridge to possible long-term recovery.  Given relatively small size of bleed and patient's young age may have reasonable neurological recovery by 3 to 6 months. -Continue daily SBT -Continue enteral blood pressure medication  Best Practice (right click and "Reselect all SmartList Selections" daily)   Diet/type: tubefeeds DVT prophylaxis: SCD GI prophylaxis: H2B Lines: N/A Foley:  Yes, and it is still needed Code Status:  full code Last date of multidisciplinary goals of care discussion : Spoke to patient's daughter, [name], who agrees to tracheostomy and PEG tube as bridge to potential recovery.  She understands that best recovery will take up to  4 months and may not be complete.  CRITICAL CARE Performed by: Kipp Brood   Total critical care time: 40 minutes  Critical care time was exclusive of separately billable procedures and treating other patients.  Critical care was necessary to treat or prevent imminent or  life-threatening deterioration.  Critical care was time spent personally by me on the following activities: development of treatment plan with patient and/or surrogate as well as nursing, discussions with consultants, evaluation of patient's response to treatment, examination of patient, obtaining history from patient or surrogate, ordering and performing treatments and interventions, ordering and review of laboratory studies, ordering and review of radiographic studies, pulse oximetry, re-evaluation of patient's condition and participation in multidisciplinary rounds.  Kipp Brood, MD [facility] ICU Physician [place]  Pager: [phone] Mobile: [phone] After hours: [phone].

## 2021-01-16 NOTE — Consult Note (Signed)
Consult Note  Timothy Good November 26, 1965  [id number].    Requesting MD: Rosalin Hawking, MD Chief Complaint/Reason for Consult: Need for enteral feeding access HPI:  Patient is a 55 year old male who was found down on a well check by the police department and was brought to [facility] to have pontine hemorrhage. Intubated and transferred to [facility]. We are consulted to evaluate for PEG placement for enteral access.   PMH significant for uncontrolled HTN, CKD stage IV, CHF, HLD, EtOH abuse, medication non-compliance, anemia of chronic disease. No past abdominal surgery. Patient reportedly drinks 1/2 gallon of vodka daily. He also reportedly has been unable to get his medications since July of last year when his insurance changed and he had to get a new PCP.   ROS: Review of Systems  Unable to perform ROS: Intubated   Family History  Problem Relation Age of Onset   Heart failure Mother 62       "died of a weak heart from smoking"   Other Other        No known heart disease    Past Medical History:  Diagnosis Date   Chronic combined systolic and diastolic CHF (congestive heart failure) ([place])    Echo 06/2010 EF 20-25%, akinesis of the inferior myocardium, severe hypokinesis of the anteroseptal myocardial and grade 3 diastolic dysfunction; Echo 01/2012 mod dilated LV, mod LVH, EF [id number], grade 2 diastolic dysfunction, mod LAE, mild RAE    CKD (chronic kidney disease)    stage IV    H/O cardiovascular stress test    Lexiscan Myoview 06/2010 was negative for inducible ischemia with an EF of 14%.   H/O medication noncompliance    due to inability to afford medications   Hypertension    Uncontrolled   Iron deficiency anemia    Protein in urine    3.9 gm Mar 2012; 1.6gm 01/2012    Past Surgical History:  Procedure Laterality Date   none      Allergies:  Allergies  Allergen Reactions   Penicillins Other (See Comments)    Lost mobility in legs for  a week.     Medications Prior to Admission  Medication Sig Dispense Refill   amLODipine (NORVASC) 10 MG tablet Take 1 tablet (10 mg total) by mouth daily. (Patient not taking: No sig reported) 30 tablet 6   carvedilol (COREG) 6.25 MG tablet Take 1 tablet (6.25 mg total) by mouth 2 (two) times daily with a meal. (Patient not taking: No sig reported) 60 tablet 6   ferrous fumarate (HEMOCYTE - 106 MG FE) 325 (106 FE) MG TABS Take 1 tablet (106 mg of iron total) by mouth daily. (Patient not taking: No sig reported) 30 each 3   furosemide (LASIX) 40 MG tablet Take 1 tablet (40 mg total) by mouth 2 (two) times daily. (Patient not taking: No sig reported) 60 tablet 6   hydrALAZINE (APRESOLINE) 50 MG tablet Take 1 tablet (50 mg total) by mouth every 8 (eight) hours. (Patient not taking: No sig reported) 90 tablet 6   isosorbide mononitrate (IMDUR) 60 MG 24 hr tablet Take 1 tablet (60 mg total) by mouth daily. (Patient not taking: No sig reported) 90 tablet 3   lisinopril (PRINIVIL,ZESTRIL) 20 MG tablet Take 1 tablet (20 mg total) by mouth daily. (Patient not taking: No sig reported) 30 tablet 6   lovastatin (MEVACOR) 20 MG tablet Take 20 mg by mouth at bedtime. (  Patient not taking: No sig reported)      Blood pressure 115/76, pulse 84, temperature 98.2 F (36.8 C), temperature source Axillary, resp. rate (!) 27, height '6\' 2"'$  (1.88 m), weight 74.2 kg, SpO2 96 %. Physical Exam:  General: pleasant, WD, chronically ill appearing male who is laying in bed and on the vent HEENT: R eye edema and scleral injection , L eye appears unaffected. Cortrak in place, ETT in place Heart: regular, rate, and rhythm.  Normal s1,s2. No obvious murmurs, gallops, or rubs noted.  Palpable radial and pedal pulses bilaterally Lungs: CTAB, no wheezes, rhonchi, or rales noted. O2 sat is 95% on the vent  Abd: soft, NT, ND, +BS, no masses, hernias, or organomegaly MS: mild edema of L hand, R hand without significant edema; Trace  edema in feet BL L>R Skin: warm and dry with no masses, lesions, or rashes Neuro: patient not responsive, did not follow commands Psych: unable to assess   Results for orders placed or performed during the hospital encounter of 01/19/2021 (from the past 48 hour(s))  Glucose, capillary     Status: Abnormal   Collection Time: 01/14/21  2:41 PM  Result Value Ref Range   Glucose-Capillary 125 (H) 70 - 99 mg/dL    Comment: Glucose reference range applies only to samples taken after fasting for at least 8 hours.  Glucose, capillary     Status: Abnormal   Collection Time: 01/14/21  4:35 PM  Result Value Ref Range   Glucose-Capillary 120 (H) 70 - 99 mg/dL    Comment: Glucose reference range applies only to samples taken after fasting for at least 8 hours.  Glucose, capillary     Status: Abnormal   Collection Time: 01/14/21  8:37 PM  Result Value Ref Range   Glucose-Capillary 125 (H) 70 - 99 mg/dL    Comment: Glucose reference range applies only to samples taken after fasting for at least 8 hours.  Glucose, capillary     Status: Abnormal   Collection Time: 01/14/21 11:29 PM  Result Value Ref Range   Glucose-Capillary 131 (H) 70 - 99 mg/dL    Comment: Glucose reference range applies only to samples taken after fasting for at least 8 hours.  CBC     Status: Abnormal   Collection Time: 01/15/21  2:56 AM  Result Value Ref Range   WBC 8.9 4.0 - 10.5 K/uL   RBC 2.49 (L) 4.22 - 5.81 MIL/uL   Hemoglobin 7.3 (L) 13.0 - 17.0 g/dL   HCT 23.4 (L) 39.0 - 52.0 %   MCV 94.0 80.0 - 100.0 fL   MCH 29.3 26.0 - 34.0 pg   MCHC 31.2 30.0 - 36.0 g/dL   RDW 14.7 11.5 - 15.5 %   Platelets 215 150 - 400 K/uL   nRBC 0.0 0.0 - 0.2 %    Comment: Performed at [facility], [place] [address], [place],  [address]  Renal function panel     Status: Abnormal   Collection Time: 01/15/21  2:56 AM  Result Value Ref Range   Sodium 143 135 - 145 mmol/L   Potassium 4.6 3.5 - 5.1 mmol/L   Chloride 112 (H) 98 -  111 mmol/L   CO2 18 (L) 22 - 32 mmol/L   Glucose, Bld 119 (H) 70 - 99 mg/dL    Comment: Glucose reference range applies only to samples taken after fasting for at least 8 hours.   BUN 80 (H) 6 - 20 mg/dL  Creatinine, Ser 5.46 (H) 0.61 - 1.24 mg/dL   Calcium 10.2 8.9 - 10.3 mg/dL   Phosphorus 4.8 (H) 2.5 - 4.6 mg/dL   Albumin 1.8 (L) 3.5 - 5.0 g/dL   GFR, Estimated 12 (L) >60 mL/min    Comment: (NOTE) Calculated using the CKD-EPI Creatinine Equation (2021)    Anion gap 13 5 - 15    Comment: Performed at [facility] [address], [place], [place] [address]  Glucose, capillary     Status: Abnormal   Collection Time: 01/15/21  5:15 AM  Result Value Ref Range   Glucose-Capillary 112 (H) 70 - 99 mg/dL    Comment: Glucose reference range applies only to samples taken after fasting for at least 8 hours.  Glucose, capillary     Status: Abnormal   Collection Time: 01/15/21  8:30 AM  Result Value Ref Range   Glucose-Capillary 118 (H) 70 - 99 mg/dL    Comment: Glucose reference range applies only to samples taken after fasting for at least 8 hours.  Glucose, capillary     Status: Abnormal   Collection Time: 01/15/21 12:12 PM  Result Value Ref Range   Glucose-Capillary 122 (H) 70 - 99 mg/dL    Comment: Glucose reference range applies only to samples taken after fasting for at least 8 hours.  Glucose, capillary     Status: Abnormal   Collection Time: 01/15/21  4:23 PM  Result Value Ref Range   Glucose-Capillary 113 (H) 70 - 99 mg/dL    Comment: Glucose reference range applies only to samples taken after fasting for at least 8 hours.  Glucose, capillary     Status: Abnormal   Collection Time: 01/15/21  8:05 PM  Result Value Ref Range   Glucose-Capillary 118 (H) 70 - 99 mg/dL    Comment: Glucose reference range applies only to samples taken after fasting for at least 8 hours.  Glucose, capillary     Status: None   Collection Time: 01/15/21 11:45 PM  Result Value Ref Range    Glucose-Capillary 93 70 - 99 mg/dL    Comment: Glucose reference range applies only to samples taken after fasting for at least 8 hours.  Glucose, capillary     Status: Abnormal   Collection Time: 01/16/21  3:45 AM  Result Value Ref Range   Glucose-Capillary 117 (H) 70 - 99 mg/dL    Comment: Glucose reference range applies only to samples taken after fasting for at least 8 hours.  CBC     Status: Abnormal   Collection Time: 01/16/21  5:00 AM  Result Value Ref Range   WBC 12.6 (H) 4.0 - 10.5 K/uL   RBC 2.44 (L) 4.22 - 5.81 MIL/uL   Hemoglobin 7.1 (L) 13.0 - 17.0 g/dL   HCT 22.6 (L) 39.0 - 52.0 %   MCV 92.6 80.0 - 100.0 fL   MCH 29.1 26.0 - 34.0 pg   MCHC 31.4 30.0 - 36.0 g/dL   RDW 14.8 11.5 - 15.5 %   Platelets 240 150 - 400 K/uL   nRBC 0.0 0.0 - 0.2 %    Comment: Performed at [facility], [place] [address], [place], [place] [address]  Renal function panel     Status: Abnormal   Collection Time: 01/16/21  5:00 AM  Result Value Ref Range   Sodium 141 135 - 145 mmol/L   Potassium 4.9 3.5 - 5.1 mmol/L   Chloride 109 98 - 111 mmol/L   CO2  20 (L) 22 - 32 mmol/L   Glucose, Bld 127 (H) 70 - 99 mg/dL    Comment: Glucose reference range applies only to samples taken after fasting for at least 8 hours.   BUN 92 (H) 6 - 20 mg/dL   Creatinine, Ser 5.44 (H) 0.61 - 1.24 mg/dL   Calcium 10.4 (H) 8.9 - 10.3 mg/dL   Phosphorus 4.6 2.5 - 4.6 mg/dL   Albumin 1.8 (L) 3.5 - 5.0 g/dL   GFR, Estimated 12 (L) >60 mL/min    Comment: (NOTE) Calculated using the CKD-EPI Creatinine Equation (2021)    Anion gap 12 5 - 15    Comment: Performed at [facility] [address], [place], [place] [address]  Glucose, capillary     Status: Abnormal   Collection Time: 01/16/21  8:04 AM  Result Value Ref Range   Glucose-Capillary 119 (H) 70 - 99 mg/dL    Comment: Glucose reference range applies only to samples taken after fasting for at least 8 hours.  Glucose, capillary     Status: Abnormal    Collection Time: 01/16/21 11:49 AM  Result Value Ref Range   Glucose-Capillary 116 (H) 70 - 99 mg/dL    Comment: Glucose reference range applies only to samples taken after fasting for at least 8 hours.   CT HEAD WO CONTRAST (5MM)  Result Date: 01/14/2021 CLINICAL DATA:  Stroke, follow up EXAM: CT HEAD WITHOUT CONTRAST TECHNIQUE: Contiguous axial images were obtained from the base of the skull through the vertex without intravenous contrast. COMPARISON:  CT head January 07, 2021. FINDINGS: Brain: When accounting for differences in technique (different plane of imaging on axial imaging), similar size an intraparenchymal hemorrhage within the pons, measuring up to approximately 2.0 by 3.2 by 2.6 cm (similar when remeasured). Surrounding edema in the brainstem appears similar. Intraventricular hemorrhage in the fourth ventricle is decreased. Similar effacement of the fourth ventricle without progressive ventriculomegaly to suggest obstructive hydrocephalus. No evidence of interval acute large vascular territory infarct. Patchy white matter hypoattenuation, compatible with advanced chronic microvascular ischemic disease that appears similar to prior. No midline shift. Similar mild cerebral atrophy. Vascular: No hyperdense vessel identified. Skull: No acute fracture. Sinuses/Orbits: Mucosal thickening of scattered paranasal sinuses, similar. No acute orbital findings. Other: Small bilateral mastoid effusions. IMPRESSION: 1. When accounting for differences in technique, similar size of a recent intraparenchymal hemorrhage within the pons, as detailed above. Similar surrounding brainstem edema. Intraventricular hemorrhage in the fourth ventricle is decreased. Similar effacement of the fourth ventricle without progressive ventriculomegaly to suggest obstructive hydrocephalus. 2. Similar advanced chronic microvascular ischemic disease. Electronically Signed   By: Margaretha Sheffield M.D.   On: 01/14/2021 16:24       Assessment/Plan Acute pontine hemorrhage with IVH Acute VDRF with hypoxemia - CCM planning tracheostomy tomorrow  Consult for PEG placement - will plan for PEG placement for enteral feeding access at bedside tomorrow (10/18) at 10 AM - hold tube feeding after midnight tonight  - discussed with patient's daughter, RN to get consent   FEN: tolerating TF @ 60 cc/h VTE: SQH, SCDs ID: no current abx  Uncontrolled HTN AKI on CKD stage IV - nephrology following  Probable aspiration PNA - s/p 5 day course rocephin  EtOH dependence  CHF Anemia of chronic disease [place] [facility] 01/16/2021, 1:11 PM Please see Amion for pager number during day hours 7:00am-4:30pm

## 2021-01-16 NOTE — Progress Notes (Signed)
STROKE TEAM PROGRESS NOTE   INTERVAL HISTORY RNs are at bedside, patient still intubated, drowsy sleepy today. Per RN, pt did not sleep last night. Pt more lethargic, only followed limited peripheral commands today. Otherwise, neuro unchanged. Had ophthal Dr. Eulas Post consult yesterday, recommend eye cover to protect corneal.    Vitals:   01/16/21 1113 01/16/21 1200 01/16/21 1300 01/16/21 1409  BP:  115/76 102/64 107/68  Pulse:  84 80   Resp:  (!) 27 (!) 27   Temp:  98.2 F (36.8 C)    TempSrc:  Axillary    SpO2: 98% 96% 96%   Weight:      Height:       CBC:  Recent Labs  Lab 01/12/21 0927 01/13/21 0334 01/15/21 0256 01/16/21 0500  WBC 9.6   < > 8.9 12.6*  NEUTROABS 7.0  --   --   --   HGB 8.2*   < > 7.3* 7.1*  HCT 27.2*   < > 23.4* 22.6*  MCV 96.1   < > 94.0 92.6  PLT 148*   < > 215 240   < > = values in this interval not displayed.   Basic Metabolic Panel:  Recent Labs  Lab 01/10/21 1859 01/11/21 0224 01/12/21 1403 01/13/21 0334 01/15/21 0256 01/16/21 0500  NA  --    < >  --    < > 143 141  K  --    < > 4.7   < > 4.6 4.9  CL  --    < >  --    < > 112* 109  CO2  --    < >  --    < > 18* 20*  GLUCOSE  --    < >  --    < > 119* 127*  BUN  --    < >  --    < > 80* 92*  CREATININE  --    < >  --    < > 5.46* 5.44*  CALCIUM  --    < >  --    < > 10.2 10.4*  MG 1.8  --  1.8  --   --   --   PHOS 4.3  --   --    < > 4.8* 4.6   < > = values in this interval not displayed.   Lipid Panel:  No results for input(s): CHOL, TRIG, HDL, CHOLHDL, VLDL, LDLCALC in the last 168 hours.  HgbA1c:  No results for input(s): HGBA1C in the last 168 hours.  Urine Drug Screen:  No results for input(s): LABOPIA, COCAINSCRNUR, LABBENZ, AMPHETMU, THCU, LABBARB in the last 168 hours.  Alcohol Level  No results for input(s): ETH in the last 168 hours.   IMAGING past 24 hours No results found.   PHYSICAL EXAM  Temp:  [97.8 F (36.6 C)-98.7 F (37.1 C)] 98.2 F (36.8 C) (10/17  1200) Pulse Rate:  [80-98] 80 (10/17 1300) Resp:  [9-30] 27 (10/17 1300) BP: (102-126)/(63-82) 107/68 (10/17 1409) SpO2:  [93 %-99 %] 96 % (10/17 1300) FiO2 (%):  [30 %] 30 % (10/17 1113) Weight:  [74.2 kg] 74.2 kg (10/16 1845)  General - Well nourished, well developed, intubated off sedation.  Ophthalmologic - fundi not visualized due to noncooperation.  Cardiovascular - Regular rate and rhythm.  Neuro - intubated off sedation, eyes close but open on voice in short period of time, right eye protrusion, projections of conjuctiva with pus secretions,  now on eye gauze cover. Only able to follow limited simple commands on the right hand. With eye opening, eyes in mid position, blinking to visual threat on the left eye, however, right eye not able to close, b/l eyes have horizontal gaze palsy, but able to have upward and downward gaze. Corneal reflex present on the left but not on the right, gag and cough present weakly. Breathing over the vent.  Facial symmetry not able to test due to ET tube.  Tongue protrusion not cooperative. Only withdraw to pain on the RUE today not moving RLE today, but hemiplegia on the left. Left no babinski. Sensation, coordination not cooperative and gait not tested.    ASSESSMENT/PLAN Timothy Good is a 55 y.o. male with a past medical history significant for uncontrolled hypertension, CKD stage IV, heart failure, hyperlipidemia, daily drinking, medication nonadherence, iron deficiency anemia/anemia of chronic disease, presenting with a pontine hemorrhage. ICH Score: 3.    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  CT head right pontine hemorrhage with midbrain and medulla extension, IVH at fourth ventricle MRI  Acute brainstem hemorrhage centered at the pons. Extensive pontine edema, which tracks associated edema which tracks into the midbrain right > left and into the right medullary pyramid. Mild  extension of hemorrhage into the 4th ventricle and basilar subarachnoid spaces. But no ventriculomegaly. No midline shift or impending herniation. Negative noncontrast MRI appearance of the orbits. CT repeat stable hematoma, no hydrocephalus Consider MRA to rule out AVM or aneurysm once hematoma resolves. 2D Echo EF 55 to 60%.  LDL 75 HgbA1c 5.4 VTE prophylaxis - Heparin subcu Not on Anticoagulant or antiplatelet prior to admission, now on no antithrombotics due to [place] Therapy recommendations:  pending patient status  Disposition: Pending  Cerebral Edema Neurosurgery consulted, no acute intervention recommended at this time. Hypertonic saline infusion has been discontinued, allow sodium to trend down with daily check   Na 143->141  Acute respiratory failure Probable aspiration pneumonia Remains intubated, appreciate CCM management First on Unasyn->Rocephin->off Tmax 100.7->afebrile now Resp culture: few gram positive cocci Will consider trach, not candidate for extubation     Hypertensive emergency Home meds: none Stable now BP goal less than 160  Currently on Norvasc 10, Coreg 25, hydralazine 100 Q8 Long-term BP goal normotensive  Hyperlipidemia Home meds:  None LDL  75, almost at goal < 70 May consider statin at discharge  AKI on CKD Ischemic ATN Creatinine 3.65->3.80->-5.2->6.27->5.69->5.46->5.44 Hb 8.2->7.6->7.7->7.3->7.1 On tube feeding and free water  Urine adequate No acute need for dialysis Nephrology on board   Right eye swelling with infection 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 Roda Shutters help  ETOH use disorder with dependence Daily use of excessive liquor per family (?1/2 bottle vodka)  Thiamine, folate and MVI on board Monitor magnesium, 2.2->2.2->2.1 Vitamin B12 -213 Monitor s/s  of withdrawal, need for CIWA   Dysphagia Continue tube feeding and free water  Speech on board CT Chest Abd Pelvis: generalized gastric wall thickening, with associated fat stranding in the gastrohepatic ligament.Findings are nonspecific and could be due to noninflammatory edema,gastritis, peptic ulcer disease or neoplasm.   Tobacco abuse Current smoker Smoking cessation counseling will be provided  Other Stroke Risk Factors Current Cigarette smoker, will be advised to stop smoking ETOH use disorder alcohol level <10, advised to drink  no more than 2 drink(s) a day  Other Active Problems   Hospital day # 10  This patient is critically ill due to pontine ICH, respiratory failure, IVH, AKI on CKD, hypertensive emergency and at significant risk of neurological worsening, death form brain herniation, renal failure, hydrocephalus, sepsis. This patient's care requires constant monitoring of vital signs, hemodynamics, respiratory and cardiac monitoring, review of multiple databases, neurological assessment, discussion with family, other specialists and medical decision making of high complexity. I spent 40 minutes of neurocritical care time in the care of this patient. I discussed with CCM Dr. Lynetta Mare.   Rosalin Hawking, MD PhD Stroke Neurology 01/16/2021 2:10 PM   To contact Stroke Continuity provider, please refer to [email or web address] After hours, contact General Neurology

## 2021-01-16 NOTE — Progress Notes (Signed)
Consent obtained for trach/peg procedures scheduled for 10/18 over phone from daughter, [name], with RN Cheri Fowler. Placed in chart.

## 2021-01-16 NOTE — Progress Notes (Signed)
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 Theador Hawthorne in [place]) and his acute kidney injury appears to be from ischemic ATN.  Received IV Lasix with albumin on 10/13.   Urine output is acceptable and creatinine level is stable to trending down gradually.  No acute need for dialysis.  We will continue daily lab, strict ins and outs and watch for renal recovery.  OK for foley removal and RN to check bladder scans.   # Hypokalemia: Potassium level in acceptable range today.  # Hypertension: Blood pressures currently well controlled on a combination of amlodipine, carvedilol and hydralazine.  Continue to hold ACE inhibitor and monitor with diuretic trial.  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  #  Hypernatremia: Medically induced/iatrogenic for management of cerebral edema.  Sodium level normal now.  #Metabolic acidosis: On sodium bicarbonate.  # Acute hypoxic respiratory failure/VDRF: managed by pulmonary team.  Subjective:  SCr stable in past 24, K 4.9, HCO3 20 UOP 1.1L BPs stable, Neuro stable but poor Lots of scretions, possibly for trach  Objective Vital signs in last 24 hours: Vitals:   01/16/21 0700 01/16/21 0800 01/16/21 0810 01/16/21 0811  BP: 118/75     Pulse: 95   95  Resp: (!) 23   (!) 26  Temp:  98.7 F (37.1 C)    TempSrc:  Axillary    SpO2: 96%  99% 99%  Weight:      Height:       Weight change: -5.9 kg  Intake/Output Summary (Last 24 hours) at 01/16/2021 0907 Last data filed at 01/16/2021 0700 Gross per 24 hour  Intake 2240 ml  Output 1335 ml  Net 905 ml        Labs: Basic Metabolic Panel: Recent Labs  Lab 01/14/21 0300 01/15/21 0256 01/16/21 0500   NA 143 143 141  K 4.7 4.6 4.9  CL 113* 112* 109  CO2 17* 18* 20*  GLUCOSE 127* 119* 127*  BUN 71* 80* 92*  CREATININE 5.69* 5.46* 5.44*  CALCIUM 10.2 10.2 10.4*  PHOS 4.6 4.8* 4.6    Liver Function Tests: Recent Labs  Lab 01/14/21 0300 01/15/21 0256 01/16/21 0500  ALBUMIN 2.0* 1.8* 1.8*    No results for input(s): LIPASE, AMYLASE in the last 168 hours.  No results for input(s): AMMONIA in the last 168 hours. CBC: Recent Labs  Lab 01/12/21 0927 01/13/21 0334 01/14/21 0300 01/15/21 0256 01/16/21 0500  WBC 9.6 9.2 8.4 8.9 12.6*  NEUTROABS 7.0  --   --   --   --   HGB 8.2* 7.6* 7.7* 7.3* 7.1*  HCT 27.2* 25.0* 25.8* 23.4* 22.6*  MCV 96.1 95.8 95.9 94.0 92.6  PLT 148* 152 168 215 240    Cardiac Enzymes: Recent Labs  Lab 01/09/21 1641  CKTOTAL 676*    CBG: Recent Labs  Lab 01/15/21 1623 01/15/21 2005 01/15/21 2345 01/16/21 0345 01/16/21 0804  GLUCAP 113* 118* 93 117* 119*     Iron Studies: No results for input(s): IRON, TIBC, TRANSFERRIN, FERRITIN in the last 72 hours. Studies/Results: CT HEAD WO CONTRAST (5MM)  Result Date: 01/14/2021 CLINICAL DATA:  Stroke, follow up EXAM: CT HEAD WITHOUT CONTRAST TECHNIQUE: Contiguous axial images were obtained from the base of the skull through the vertex without intravenous  contrast. COMPARISON:  CT head January 07, 2021. FINDINGS: Brain: When accounting for differences in technique (different plane of imaging on axial imaging), similar size an intraparenchymal hemorrhage within the pons, measuring up to approximately 2.0 by 3.2 by 2.6 cm (similar when remeasured). Surrounding edema in the brainstem appears similar. Intraventricular hemorrhage in the fourth ventricle is decreased. Similar effacement of the fourth ventricle without progressive ventriculomegaly to suggest obstructive hydrocephalus. No evidence of interval acute large vascular territory infarct. Patchy white matter hypoattenuation, compatible with advanced  chronic microvascular ischemic disease that appears similar to prior. No midline shift. Similar mild cerebral atrophy. Vascular: No hyperdense vessel identified. Skull: No acute fracture. Sinuses/Orbits: Mucosal thickening of scattered paranasal sinuses, similar. No acute orbital findings. Other: Small bilateral mastoid effusions. IMPRESSION: 1. When accounting for differences in technique, similar size of a recent intraparenchymal hemorrhage within the pons, as detailed above. Similar surrounding brainstem edema. Intraventricular hemorrhage in the fourth ventricle is decreased. Similar effacement of the fourth ventricle without progressive ventriculomegaly to suggest obstructive hydrocephalus. 2. Similar advanced chronic microvascular ischemic disease. Electronically Signed   By: Margaretha Sheffield M.D.   On: 01/14/2021 16:24    Medications: Infusions:  sodium chloride Stopped (01/14/21 1454)   feeding supplement (VITAL 1.5 CAL) 1,000 mL (01/15/21 1845)    Scheduled Medications:  amLODipine  10 mg Per Tube Daily   artificial tears   Right Eye Q2H   carvedilol  25 mg Per Tube BID WC   chlorhexidine gluconate (MEDLINE KIT)  15 mL Mouth Rinse BID   Chlorhexidine Gluconate Cloth  6 each Topical Daily   ciprofloxacin  2 drop Right Eye Q4H while awake   erythromycin   Right Eye QHS   feeding supplement (PROSource TF)  45 mL Per Tube BID   fiber  1 packet Per Tube BID   folic acid  1 mg Per Tube Daily   free water  200 mL Per Tube Q6H   heparin injection (subcutaneous)  5,000 Units Subcutaneous Q8H   hydrALAZINE  100 mg Per Tube Q8H   insulin aspart  0-6 Units Subcutaneous Q4H   mouth rinse  15 mL Mouth Rinse 10 times per day   multivitamin with minerals  1 tablet Per Tube Daily   pantoprazole sodium  40 mg Per Tube Daily   sodium bicarbonate  650 mg Per Tube TID   thiamine  100 mg Per Tube Daily    have reviewed scheduled and prn medications.  Physical Exam: General: Ill-looking male on  mechanical ventilation, not responding.   Heart:RRR, s1s2 nl Lungs: Coarse breath sound bilateral. Abdomen:soft, Non-tender, non-distended Extremities:No edema Neurology: Not responding and not following command GU: Foley catheter in place  Alpha 01/16/2021,9:07 AM  LOS: 10 days

## 2021-01-16 NOTE — Progress Notes (Signed)
Inpatient Rehab Admissions Coordinator:   Per therapy recommendations, patient was screened for CIR candidacy by Clemens Catholic, MS, CCC-SLP ) At this time, Pt. is not yet tolerating OOB  Pt. may have potential to progress to becoming a potential CIR candidate, so CIR admissions team will follow and monitor for progress and participation with therapies and place consult order if Pt. appears to be an appropriate candidate. Please contact me with any questions.   Clemens Catholic, [place] Admissions Coordinator  [phone] ([place]) [phone] (office)

## 2021-01-16 NOTE — Progress Notes (Signed)
Physical Therapy Treatment Patient Details Name: Timothy Good MRN: [id number] DOB: [birth year] Today's Date: 01/16/2021   History of Present Illness 55 y.o. male presents to [facility] hospital on 01/05/2021, found down during a well check. Pt was emergently intubated and head CT revealed pontine hemorrhage. PMH includes HTN, CKD stage IV, HLD, alcohol abuse, anemia.    PT Comments    Pt lethargic today, intermittently opens eyes with auditory and tactile stimulation but cannot sustain. Per RN, no sedation present. Pt inconsistently following commands on R side today, typically can generalize motion to correct limb but does not perform requested action today (I.e. "straighten your R leg" and pt wiggles R toes). Pt tolerated LE AAROM and PROM exercises, performed in chair position in bed to increased arousal although minimally successful. PT to continue to follow.      Recommendations for follow up therapy are one component of a multi-disciplinary discharge planning process, led by the attending physician.  Recommendations may be updated based on patient status, additional functional criteria and insurance authorization.  Follow Up Recommendations  CIR;Supervision/Assistance - 24 hour     Equipment Recommendations  Wheelchair (measurements PT);Wheelchair cushion (measurements PT);Hospital bed;Other (comment) (hoyer lift)    Recommendations for Other Services       Precautions / Restrictions Precautions Precautions: Fall Precaution Comments: intubated 30% PEEP 5, cortrak Restrictions Weight Bearing Restrictions: No     Mobility  Bed Mobility Overal bed mobility: Needs Assistance             General bed mobility comments: chair position for intervention to improve arousal    Transfers                    Ambulation/Gait                 Stairs             Wheelchair Mobility    Modified Rankin (Stroke Patients Only) Modified Rankin (Stroke Patients  Only) Pre-Morbid Rankin Score: No significant disability Modified Rankin: Severe disability     Balance                                            Cognition Arousal/Alertness: Awake/alert Behavior During Therapy: Flat affect Overall Cognitive Status: Difficult to assess                                 General Comments: Pt with very inconsistent command following, tends to localize command to correct limb but does not perform requested action. Pt is lethargic, opens eyes with max cuing      Exercises General Exercises - Upper Extremity Shoulder Flexion: PROM;Both;10 reps;Other (comment) (chair position, limited ROM to 75* shoulder flexion for shoulder protection) Elbow Flexion: PROM;Left;AAROM;Right;10 reps;Supine Elbow Extension: PROM;Left;AAROM;Right;10 reps;Supine Wrist Flexion: PROM;Left;15 reps Wrist Extension: PROM;Left;15 reps General Exercises - Lower Extremity Ankle Circles/Pumps: PROM;Both;10 reps;Supine Short Arc Quad: AAROM;Right;15 reps;Supine Heel Slides: PROM;Left;AAROM;Right;15 reps;Supine Hip ABduction/ADduction: PROM;Left;AAROM;Right;15 reps;Supine    General Comments        Pertinent Vitals/Pain Pain Assessment: Faces Faces Pain Scale: Hurts a little bit Pain Location: generalized Pain Descriptors / Indicators: Guarding Pain Intervention(s): Monitored during session;Limited activity within patient's tolerance    Home Living  Prior Function            PT Goals (current goals can now be found in the care plan section) Acute Rehab PT Goals Patient Stated Goal: Pt unable to state 2/2 intubated, PT Goal Formulation: Patient unable to participate in goal setting Time For Goal Achievement: 01/22/21 Potential to Achieve Goals: Fair Progress towards PT goals: Progressing toward goals    Frequency    Min 3X/week (until off vent)      PT Plan Current plan remains appropriate     Co-evaluation              AM-PAC PT "6 Clicks" Mobility   Outcome Measure  Help needed turning from your back to your side while in a flat bed without using bedrails?: Total Help needed moving from lying on your back to sitting on the side of a flat bed without using bedrails?: Total Help needed moving to and from a bed to a chair (including a wheelchair)?: Total Help needed standing up from a chair using your arms (e.g., wheelchair or bedside chair)?: Total Help needed to walk in hospital room?: Total Help needed climbing 3-5 steps with a railing? : Total 6 Click Score: 6    End of Session Equipment Utilized During Treatment: Other (comment) (ETT) Activity Tolerance: Patient limited by lethargy Patient left: in bed;with call bell/phone within reach;with bed alarm set;Other (comment) (R handmitt re-donned at end of session) Nurse Communication: Mobility status;Need for lift equipment PT Visit Diagnosis: Other abnormalities of gait and mobility (R26.89);Muscle weakness (generalized) (M62.81);Other symptoms and signs involving the nervous system (R29.898)     Time: [id number] PT Time Calculation (min) (ACUTE ONLY): 20 min  Charges:  $Therapeutic Exercise: 8-22 mins                    Stacie Glaze, PT DPT Acute Rehabilitation Services Pager [phone]  Office [phone]    [name] 01/16/2021, 1:51 PM

## 2021-01-17 ENCOUNTER — Encounter (HOSPITAL_COMMUNITY): Admission: EM | Disposition: E | Payer: Self-pay | Source: Home / Self Care | Attending: Neurology

## 2021-01-17 DIAGNOSIS — Z978 Presence of other specified devices: Secondary | ICD-10-CM | POA: Diagnosis not present

## 2021-01-17 DIAGNOSIS — I161 Hypertensive emergency: Secondary | ICD-10-CM | POA: Diagnosis not present

## 2021-01-17 DIAGNOSIS — I613 Nontraumatic intracerebral hemorrhage in brain stem: Secondary | ICD-10-CM | POA: Diagnosis not present

## 2021-01-17 DIAGNOSIS — J9601 Acute respiratory failure with hypoxia: Secondary | ICD-10-CM | POA: Diagnosis not present

## 2021-01-17 HISTORY — PX: ESOPHAGOGASTRODUODENOSCOPY (EGD) WITH PROPOFOL: SHX5813

## 2021-01-17 HISTORY — PX: PEG PLACEMENT: SHX5437

## 2021-01-17 LAB — RENAL FUNCTION PANEL
Albumin: 1.7 g/dL — ABNORMAL LOW (ref 3.5–5.0)
Anion gap: 13 (ref 5–15)
BUN: 102 mg/dL — ABNORMAL HIGH (ref 6–20)
CO2: 21 mmol/L — ABNORMAL LOW (ref 22–32)
Calcium: 10.3 mg/dL (ref 8.9–10.3)
Chloride: 108 mmol/L (ref 98–111)
Creatinine, Ser: 5.6 mg/dL — ABNORMAL HIGH (ref 0.61–1.24)
GFR, Estimated: 11 mL/min — ABNORMAL LOW (ref >60)
Glucose, Bld: 90 mg/dL (ref 70–99)
Phosphorus: 5.3 mg/dL — ABNORMAL HIGH (ref 2.5–4.6)
Potassium: 5.4 mmol/L — ABNORMAL HIGH (ref 3.5–5.1)
Sodium: 142 mmol/L (ref 135–145)

## 2021-01-17 LAB — GLUCOSE, CAPILLARY
Glucose-Capillary: 107 mg/dL — ABNORMAL HIGH (ref 70–99)
Glucose-Capillary: 114 mg/dL — ABNORMAL HIGH (ref 70–99)
Glucose-Capillary: 82 mg/dL (ref 70–99)
Glucose-Capillary: 85 mg/dL (ref 70–99)
Glucose-Capillary: 87 mg/dL (ref 70–99)
Glucose-Capillary: 87 mg/dL (ref 70–99)

## 2021-01-17 LAB — CBC
HCT: 21.7 % — ABNORMAL LOW (ref 39.0–52.0)
Hemoglobin: 6.6 g/dL — CL (ref 13.0–17.0)
MCH: 28.9 pg (ref 26.0–34.0)
MCHC: 30.4 g/dL (ref 30.0–36.0)
MCV: 95.2 fL (ref 80.0–100.0)
Platelets: 271 10*3/uL (ref 150–400)
RBC: 2.28 MIL/uL — ABNORMAL LOW (ref 4.22–5.81)
RDW: 14.6 % (ref 11.5–15.5)
WBC: 14.2 10*3/uL — ABNORMAL HIGH (ref 4.0–10.5)
nRBC: 0.1 % (ref 0.0–0.2)

## 2021-01-17 LAB — PROTIME-INR
INR: 1.1 (ref 0.8–1.2)
Prothrombin Time: 14.4 seconds (ref 11.4–15.2)

## 2021-01-17 LAB — HEMOGLOBIN AND HEMATOCRIT, BLOOD
HCT: 25.8 % — ABNORMAL LOW (ref 39.0–52.0)
Hemoglobin: 8 g/dL — ABNORMAL LOW (ref 13.0–17.0)

## 2021-01-17 LAB — ABO/RH: ABO/RH(D): B POS

## 2021-01-17 LAB — APTT: aPTT: 50 seconds — ABNORMAL HIGH (ref 24–36)

## 2021-01-17 LAB — PREPARE RBC (CROSSMATCH)

## 2021-01-17 SURGERY — INSERTION, PEG TUBE

## 2021-01-17 MED ORDER — ASCORBIC ACID 500 MG PO TABS
2000.0000 mg | ORAL_TABLET | Freq: Every day | ORAL | Status: DC
  Administered 2021-01-17 – 2021-01-28 (×12): 2000 mg
  Filled 2021-01-17 (×12): qty 4

## 2021-01-17 MED ORDER — VECURONIUM BROMIDE 10 MG IV SOLR: 10.0000 mg | Freq: Once | INTRAVENOUS | Status: DC

## 2021-01-17 MED ORDER — FENTANYL CITRATE PF 50 MCG/ML IJ SOSY: 50.0000 ug | PREFILLED_SYRINGE | Freq: Once | INTRAMUSCULAR | Status: DC

## 2021-01-17 MED ORDER — SODIUM CHLORIDE 0.9% IV SOLUTION: Freq: Once | INTRAVENOUS | Status: AC

## 2021-01-17 MED ORDER — HYDRALAZINE HCL 50 MG PO TABS
50.0000 mg | ORAL_TABLET | Freq: Three times a day (TID) | ORAL | Status: DC
  Administered 2021-01-17 – 2021-01-22 (×15): 50 mg
  Filled 2021-01-17 (×15): qty 1

## 2021-01-17 MED ORDER — SODIUM ZIRCONIUM CYCLOSILICATE 10 G PO PACK: 10.0000 g | PACK | Freq: Every day | ORAL | Status: DC

## 2021-01-17 MED ORDER — JEVITY 1.5 CAL/FIBER PO LIQD
1000.0000 mL | ORAL | Status: DC
  Administered 2021-01-17 – 2021-01-18 (×2): 1000 mL
  Filled 2021-01-17 (×4): qty 1000

## 2021-01-17 MED ORDER — SODIUM ZIRCONIUM CYCLOSILICATE 10 G PO PACK
10.0000 g | PACK | Freq: Every day | ORAL | Status: DC
  Filled 2021-01-17: qty 1

## 2021-01-17 MED ORDER — VECURONIUM BROMIDE 10 MG IV SOLR
10.0000 mg | Freq: Once | INTRAVENOUS | Status: AC
  Administered 2021-01-17: 10 mg via INTRAVENOUS

## 2021-01-17 MED ORDER — MIDAZOLAM HCL 2 MG/2ML IJ SOLN
2.0000 mg | Freq: Once | INTRAMUSCULAR | Status: AC
  Administered 2021-01-17: 2 mg via INTRAVENOUS

## 2021-01-17 MED ORDER — MIDAZOLAM HCL 2 MG/2ML IJ SOLN: 2.0000 mg | Freq: Once | INTRAMUSCULAR | Status: DC

## 2021-01-17 MED ORDER — DOXYCYCLINE HYCLATE 100 MG PO TABS
100.0000 mg | ORAL_TABLET | Freq: Two times a day (BID) | ORAL | Status: DC
  Administered 2021-01-17 – 2021-01-28 (×23): 100 mg
  Filled 2021-01-17 (×25): qty 1

## 2021-01-17 MED ORDER — SODIUM ZIRCONIUM CYCLOSILICATE 10 G PO PACK
10.0000 g | PACK | Freq: Every day | ORAL | Status: DC
  Administered 2021-01-17 – 2021-01-23 (×7): 10 g
  Filled 2021-01-17 (×6): qty 1

## 2021-01-17 NOTE — Progress Notes (Signed)
Subjective Resting in bed. RN at bedside.  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 [facility]

## 2021-01-17 NOTE — Progress Notes (Signed)
Attempted to obtain blood consent from patients daughter [name] and sister Davieon Morefield. Both numbers unanswered, attempted to call twice. Will continue to call for blood consent.

## 2021-01-17 NOTE — Progress Notes (Signed)
SLP Cancellation Note  Patient Details Name: Timothy Good MRN: [id number] DOB: 1965/11/13   Cancelled treatment:       Reason Eval/Treat Not Completed: Medical issues which prohibited therapy; pt remains on the ventilator, will s/o for now due to prolonged vent dependency. Please reconsult SLP service line for any future needs.   [place], CCC-SLP Acute Rehabilitation Services   01/12/2021, 9:21 AM

## 2021-01-17 NOTE — Progress Notes (Signed)
Date and time results received: 12/31/2020 0530   Test: Hgb Critical Value: 6.6  Name of Provider Notified: Elink nurse Camron  Orders Received? Or Actions Taken?: Awaiting new orders for blood transfusion

## 2021-01-17 NOTE — Progress Notes (Addendum)
Nutrition Follow-up  DOCUMENTATION CODES:   Non-severe (moderate) malnutrition in context of chronic illness  INTERVENTION:   Tube feeding via Cortrak tube: Transition to Jevity 1.5 @ 60 mL/hr (1440 mL/day) 45 mL ProSource TF BID 200 mL free water flush q6h  Provides 2210 kcal, 114 gr PRO, 1094 mL free water daily (1894 mL total free water)  NUTRITION DIAGNOSIS:   Moderate Malnutrition related to chronic illness as evidenced by moderate fat depletion, moderate muscle depletion, severe muscle depletion. - Ongoing  GOAL:   Patient will meet greater than or equal to 90% of their needs - Progressing, pt meeting 100% of his needs via TF  MONITOR:   TF tolerance  REASON FOR ASSESSMENT:   Consult Enteral/tube feeding initiation and management  ASSESSMENT:   Pt with PMH of uncontrolled HTN, CKD stage IV, CHF, HLD, daily ETOH, medication non-adherence, iron deficiency anemia admitted after being found down at home by poilce during a well check. Admitted with a dx of pontine hemorrhage.  10/12 cortrak placed; tip gastric 10/18: PEG placement  Pt was planned for trach on 10/18 with PEG placement, trach placement not completed due to findings of thyroid nodules.   Ophthalmology saw pt for inability to close his R eye. Pt at risk for corneal thinning. Ophthalmology placed pt on 2 grams Vitamin C daily for collagen synthesis and 100 mg Doxycycline BID. Eye taped shut.  Pt tube feeding off, no drips. Pt continuing to have loose stool via rectal tube; pt started on fiber supplement 10/17.  Patient is currently intubated on ventilator support. MV: 9.6 L/min MAP: 90 (cuff) Temp (24hrs), Avg:98.2 F (36.8 C), Min:97.9 F (36.6 C), Max:98.4 F (36.9 C)   Medications reviewed and include: Vitamin C, Doxycycline, Fetanyl, Fiber, SSI 0-6 units q4h, Protonix, Midazolam, Sodium Bicarbonate, Thiamine, Sodium Zirconium Cyclosilicate Labs reviewed: Potassium 5.4, BUN 102, Creatinine 5.6,  Phosphorus 5.3, 24 hr BG trends 85-119  UOP: 870 mL x 24 hr I/O's: +11.7 L since admit  Admission Weight: 70.7 kg Current Weight: 74.6 kg (pt with mild edema noted)  Diet Order:   Diet Order             Diet NPO time specified  Diet effective midnight                   EDUCATION NEEDS:   Not appropriate for education at this time  Skin:  Skin Assessment: Reviewed RN Assessment  Last BM:  Rectal Tube - 275 mL  Height:   Ht Readings from Last 1 Encounters:  01/10/21 '6\' 2"'$  (1.88 m)    Weight:   Wt Readings from Last 1 Encounters:  01/16/21 74.6 kg    Ideal Body Weight:     BMI:  Body mass index is 21.12 kg/m.  Estimated Nutritional Needs:   Kcal:  2000-2200  Protein:  105-120 grams  Fluid:  > 2 L/day    Hermina Barters BS, PLDN Clinical Dietitian See [facility] for contact information.

## 2021-01-17 NOTE — Progress Notes (Signed)
Patient ID: Timothy Good, male   DOB: February 16, 1966, 55 y.o.   MRN: [id number] Day of Surgery   Subjective: On vent ROS negative except as listed above. Objective: Vital signs in last 24 hours: Temp:  [97.9 F (36.6 C)-98.7 F (37.1 C)] 98.4 F (36.9 C) (10/18 0000) Pulse Rate:  [80-96] 81 (10/18 0700) Resp:  [16-31] 16 (10/18 0700) BP: (102-125)/(64-82) 107/71 (10/18 0500) SpO2:  [94 %-99 %] 99 % (10/18 0700) FiO2 (%):  [30 %] 30 % (10/18 0352) Weight:  [74.6 kg] 74.6 kg (10/17 1800) Last BM Date: 01/16/21  Intake/Output from previous day: 10/17 0701 - 10/18 0700 In: 1380 [NG/GT:1300] Out: 1145 [Urine:870; Stool:275] Intake/Output this shift: No intake/output data recorded.  General appearance: on vent Resp: clear to auscultation bilaterally Cardio: regular rate and rhythm GI: soft, NT  Lab Results: CBC  Recent Labs    01/16/21 0500 01/11/2021 0354  WBC 12.6* 14.2*  HGB 7.1* 6.6*  HCT 22.6* 21.7*  PLT 240 271   BMET Recent Labs    01/16/21 0500 01/21/2021 0354  NA 141 142  K 4.9 5.4*  CL 109 108  CO2 20* 21*  GLUCOSE 127* 90  BUN 92* 102*  CREATININE 5.44* 5.60*  CALCIUM 10.4* 10.3   PT/INR Recent Labs    01/05/2021 0354  LABPROT 14.4  INR 1.1   ABG No results for input(s): PHART, HCO3 in the last 72 hours.  Invalid input(s): PCO2, PO2  Studies/Results: No results found.  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 For PEG today. Procedure,  risks, and benefits discussed yesterday with family. Consent documented. I also D/W Dr. Lynetta Mare today.  Anemia - per CCM  LOS: 11 days    Georganna Skeans, MD, MPH, FACS Trauma & General Surgery Use [email or web address] to contact on call provider  01/19/2021

## 2021-01-17 NOTE — Progress Notes (Signed)
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 Theador Hawthorne in [place]) and his acute kidney injury appears to be from ischemic ATN.    No acute need for dialysis.  We will continue daily lab, strict ins and outs and watch for renal recovery.  OK for foley removal and RN to check bladder scans.   # Hypokalemia: Potassium level in acceptable range today.  # Hypertension: Blood pressures currently well controlled on a combination of amlodipine, carvedilol and hydralazine.  Continue to hold ACE inhibitor and monitor with diuretic trial.  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  #  Hypernatremia: Medically induced/iatrogenic for management of cerebral edema.  Sodium level normal now.  #Metabolic acidosis: On sodium bicarbonate.  # Acute hypoxic respiratory failure/VDRF: managed by pulmonary team.  #Hyperkalemia, mild: start daily Lokelma  Subjective:  SCr up to 5.6, K 4.4, HCO3 20 UOP 0.9L BPs stable, Neuro stable but poor S/p PEG earlier today Plan for trach  Objective Vital signs in last 24 hours: Vitals:   01/10/2021 0800 01/09/2021 0815 01/03/2021 0830 01/09/2021 1023  BP:  107/71 105/71 110/77  Pulse: 80 80 81 81  Resp: $Remo'17 15 16 17  'CbBOH$ Temp:  98.1 F (36.7 C) 98.1 F (36.7 C) 98.2 F (36.8 C)  TempSrc:  Axillary Axillary Axillary  SpO2: 98% 99% 99% 97%  Weight:      Height:       Weight change: 0.4 kg  Intake/Output Summary (Last 24 hours) at 01/09/2021 1045 Last data filed at 01/08/2021 1023 Gross per 24 hour  Intake 1456 ml  Output 925 ml  Net 531 ml        Labs: Basic Metabolic Panel: Recent Labs  Lab 01/15/21 0256 01/16/21 0500 01/16/2021 0354  NA 143 141 142  K 4.6 4.9  5.4*  CL 112* 109 108  CO2 18* 20* 21*  GLUCOSE 119* 127* 90  BUN 80* 92* 102*  CREATININE 5.46* 5.44* 5.60*  CALCIUM 10.2 10.4* 10.3  PHOS 4.8* 4.6 5.3*    Liver Function Tests: Recent Labs  Lab 01/15/21 0256 01/16/21 0500 01/23/2021 0354  ALBUMIN 1.8* 1.8* 1.7*    No results for input(s): LIPASE, AMYLASE in the last 168 hours.  No results for input(s): AMMONIA in the last 168 hours. CBC: Recent Labs  Lab 01/12/21 0927 01/13/21 0334 01/14/21 0300 01/15/21 0256 01/16/21 0500 01/06/2021 0354  WBC 9.6 9.2 8.4 8.9 12.6* 14.2*  NEUTROABS 7.0  --   --   --   --   --   HGB 8.2* 7.6* 7.7* 7.3* 7.1* 6.6*  HCT 27.2* 25.0* 25.8* 23.4* 22.6* 21.7*  MCV 96.1 95.8 95.9 94.0 92.6 95.2  PLT 148* 152 168 215 240 271    Cardiac Enzymes: No results for input(s): CKTOTAL, CKMB, CKMBINDEX, TROPONINI in the last 168 hours.  CBG: Recent Labs  Lab 01/16/21 1541 01/16/21 1954 01/16/21 2336 01/11/2021 0337 01/01/2021 0803  GLUCAP 117* 115* 98 85 87     Iron Studies: No results for input(s): IRON, TIBC, TRANSFERRIN, FERRITIN in the last 72 hours. Studies/Results: No results found.  Medications: Infusions:  sodium chloride Stopped (01/14/21 1454)   feeding supplement (VITAL 1.5 CAL) Stopped (01/16/21 2350)    Scheduled Medications:  amLODipine  10 mg Per Tube Daily  artificial tears   Right Eye Q2H   vitamin C  2,000 mg Per Tube Daily   carvedilol  25 mg Per Tube BID WC   chlorhexidine gluconate (MEDLINE KIT)  15 mL Mouth Rinse BID   Chlorhexidine Gluconate Cloth  6 each Topical Daily   ciprofloxacin  2 drop Right Eye Q4H while awake   doxycycline  100 mg Per Tube Q12H   erythromycin   Right Eye QHS   feeding supplement (PROSource TF)  45 mL Per Tube BID   fentaNYL (SUBLIMAZE) injection  200 mcg Intravenous Once   fentaNYL (SUBLIMAZE) injection  50 mcg Intravenous Once   fiber  1 packet Per Tube BID   free water  200 mL Per Tube Q6H   heparin injection (subcutaneous)   5,000 Units Subcutaneous Q8H   hydrALAZINE  50 mg Per Tube Q8H   insulin aspart  0-6 Units Subcutaneous Q4H   mouth rinse  15 mL Mouth Rinse 10 times per day   midazolam  5 mg Intravenous Once   pantoprazole sodium  40 mg Per Tube Daily   propofol  50 mg Intravenous Once   sodium bicarbonate  650 mg Per Tube TID   thiamine  100 mg Per Tube Daily   vecuronium  10 mg Intravenous Once    have reviewed scheduled and prn medications.  Physical Exam: General: Ill-looking male on mechanical ventilation, not responding.   Heart:RRR, s1s2 nl Lungs: Coarse breath sound bilateral. Abdomen:soft, Non-tender, non-distended Extremities:No edema Neurology: Not responding and not following command GU: Foley catheter in place  Timothy Good B Timothy Good 01/14/2021,10:45 AM  LOS: 11 days

## 2021-01-17 NOTE — Progress Notes (Signed)
STROKE TEAM PROGRESS NOTE   INTERVAL HISTORY RN, Dr. Tacy Learn and Dr. Lynetta Mare are at the bedside for trach. Pt was sedated and paralyzed. Ophthalmologist Dr. Eulas Post was here early for right eye corneal abrasion, continue eye drops and ointment but also recommend VitC and docxy. Trauma on board for PEG.     Vitals:   01/07/2021 0800 01/22/2021 0815 01/30/2021 0830 01/11/2021 1023  BP:  107/71 105/71 110/77  Pulse: 80 80 81 81  Resp: '17 15 16 17  '$ Temp:  98.1 F (36.7 C) 98.1 F (36.7 C) 98.2 F (36.8 C)  TempSrc:  Axillary Axillary Axillary  SpO2: 98% 99% 99% 97%  Weight:      Height:       CBC:  Recent Labs  Lab 01/12/21 0927 01/13/21 0334 01/16/21 0500 01/02/2021 0354  WBC 9.6   < > 12.6* 14.2*  NEUTROABS 7.0  --   --   --   HGB 8.2*   < > 7.1* 6.6*  HCT 27.2*   < > 22.6* 21.7*  MCV 96.1   < > 92.6 95.2  PLT 148*   < > 240 271   < > = values in this interval not displayed.   Basic Metabolic Panel:  Recent Labs  Lab 01/10/21 1859 01/11/21 0224 01/12/21 1403 01/13/21 0334 01/16/21 0500 01/19/2021 0354  NA  --    < >  --    < > 141 142  K  --    < > 4.7   < > 4.9 5.4*  CL  --    < >  --    < > 109 108  CO2  --    < >  --    < > 20* 21*  GLUCOSE  --    < >  --    < > 127* 90  BUN  --    < >  --    < > 92* 102*  CREATININE  --    < >  --    < > 5.44* 5.60*  CALCIUM  --    < >  --    < > 10.4* 10.3  MG 1.8  --  1.8  --   --   --   PHOS 4.3  --   --    < > 4.6 5.3*   < > = values in this interval not displayed.   Lipid Panel:  No results for input(s): CHOL, TRIG, HDL, CHOLHDL, VLDL, LDLCALC in the last 168 hours.  HgbA1c:  No results for input(s): HGBA1C in the last 168 hours.  Urine Drug Screen:  No results for input(s): LABOPIA, COCAINSCRNUR, LABBENZ, AMPHETMU, THCU, LABBARB in the last 168 hours.  Alcohol Level  No results for input(s): ETH in the last 168 hours.   IMAGING past 24 hours No results found.   PHYSICAL EXAM  Temp:  [97.9 F (36.6 C)-98.4 F (36.9  C)] 98.2 F (36.8 C) (10/18 1023) Pulse Rate:  [80-91] 81 (10/18 1023) Resp:  [15-31] 17 (10/18 1023) BP: (102-115)/(64-77) 110/77 (10/18 1023) SpO2:  [94 %-99 %] 97 % (10/18 1023) FiO2 (%):  [30 %] 30 % (10/18 0743) Weight:  [74.6 kg] 74.6 kg (10/17 1800)  General - Well nourished, well developed, intubated on sedation.  Ophthalmologic - fundi not visualized due to noncooperation.  Cardiovascular - Regular rate and rhythm.  Neuro - intubated on sedation and paralysed for trach.    ASSESSMENT/PLAN Timothy Good is a  55 y.o. male with a past medical history significant for uncontrolled hypertension, CKD stage IV, heart failure, hyperlipidemia, daily drinking, medication nonadherence, iron deficiency anemia/anemia of chronic disease, presenting with a pontine hemorrhage. ICH Score: 3.    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  CT head right pontine hemorrhage with midbrain and medulla extension, IVH at fourth ventricle MRI  Acute brainstem hemorrhage centered at the pons. Extensive pontine edema, which tracks associated edema which tracks into the midbrain right > left and into the right medullary pyramid. Mild extension of hemorrhage into the 4th ventricle and basilar subarachnoid spaces. But no ventriculomegaly. No midline shift or impending herniation. Negative noncontrast MRI appearance of the orbits. CT repeat stable hematoma, no hydrocephalus Consider MRA to rule out AVM or aneurysm once hematoma resolves. 2D Echo EF 55 to 60%.  LDL 75 HgbA1c 5.4 VTE prophylaxis - Heparin subcu Not on Anticoagulant or antiplatelet prior to admission, now on no antithrombotics due to [place] Therapy recommendations:  pending Disposition: Pending  Cerebral Edema Neurosurgery consulted, no acute intervention recommended at this time. Hypertonic saline infusion has been discontinued, allow sodium to trend down with daily check   Na  143->141  Acute respiratory failure Probable aspiration pneumonia Remains intubated, appreciate CCM management First on Unasyn->Rocephin->off Tmax 100.7->afebrile now Resp culture: few gram positive cocci Trach and PEG today with CCM and Trauma    Hypertensive emergency Home meds: none Stable now BP goal less than 160  Currently on Norvasc 10, Coreg 25, hydralazine 100 Q8 Long-term BP goal normotensive  Hyperlipidemia Home meds:  None LDL  75, almost at goal < 70 May consider statin at discharge  AKI on CKD Ischemic ATN Anemia  Creatinine 3.65->3.80->-5.2->6.27->5.69->5.46->5.44->5.60 Hb 8.2->7.6->7.7->7.3->7.1->6.6->PRBC On tube feeding and free water  Urine adequate No acute need for dialysis Nephrology on board   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 Roda Shutters help  ETOH use disorder with dependence Daily use of excessive liquor per family (?1/2 bottle vodka)  Thiamine, folate and MVI on board Monitor magnesium, 2.2->2.2->2.1 Vitamin B12 -213 Monitor s/s of withdrawal, need for CIWA   Dysphagia Continue tube feeding and free water  Speech on board CT Chest Abd Pelvis: generalized gastric wall thickening, with associated fat stranding in the gastrohepatic ligament.Findings are nonspecific and could be due to noninflammatory edema,gastritis, peptic ulcer disease or neoplasm.  Trauma on board for PEG  Tobacco abuse Current smoker Smoking cessation counseling will be provided  Other Stroke Risk Factors Current Cigarette smoker, will be advised to stop smoking ETOH use disorder alcohol level <10, advised to drink no more than 2 drink(s) a day  Other Active Problems   Hospital day  # 11  This patient is critically ill due to pontine ICH, respiratory failure, IVH, AKI on CKD, hypertensive emergency and at significant risk of neurological worsening, death form brain herniation, renal failure, hydrocephalus, sepsis. This patient's care requires constant monitoring of vital signs, hemodynamics, respiratory and cardiac monitoring, review of multiple databases, neurological assessment, discussion with family, other specialists and medical decision making of high complexity. I spent 30 minutes of neurocritical care time in the care of this patient.   Rosalin Hawking, MD PhD Stroke Neurology 01/16/2021 10:39 AM  To contact Stroke Continuity provider, please refer to [email or web address] After hours, contact General Neurology

## 2021-01-17 NOTE — Progress Notes (Signed)
[facility] Progress Note Patient Name: Timothy Good DOB: 01/31/1966 MRN: [id number]   Date of Service  01/12/2021  HPI/Events of Note  Notified of hemoglobin at 6.6 down from 7.1.  eICU Interventions  Type and screen ordered.  Transfuse 1 unit pRBC.      Intervention Category Intermediate Interventions: Other:  Elsie Lincoln 01/24/2021, 5:37 AM

## 2021-01-18 ENCOUNTER — Encounter (HOSPITAL_COMMUNITY): Payer: Self-pay | Admitting: General Surgery

## 2021-01-18 DIAGNOSIS — I161 Hypertensive emergency: Secondary | ICD-10-CM | POA: Diagnosis not present

## 2021-01-18 DIAGNOSIS — I613 Nontraumatic intracerebral hemorrhage in brain stem: Secondary | ICD-10-CM | POA: Diagnosis not present

## 2021-01-18 DIAGNOSIS — Z978 Presence of other specified devices: Secondary | ICD-10-CM | POA: Diagnosis not present

## 2021-01-18 DIAGNOSIS — J9601 Acute respiratory failure with hypoxia: Secondary | ICD-10-CM | POA: Diagnosis not present

## 2021-01-18 LAB — RENAL FUNCTION PANEL
Albumin: 1.9 g/dL — ABNORMAL LOW (ref 3.5–5.0)
Anion gap: 14 (ref 5–15)
BUN: 122 mg/dL — ABNORMAL HIGH (ref 6–20)
CO2: 19 mmol/L — ABNORMAL LOW (ref 22–32)
Calcium: 9.8 mg/dL (ref 8.9–10.3)
Chloride: 103 mmol/L (ref 98–111)
Creatinine, Ser: 5.53 mg/dL — ABNORMAL HIGH (ref 0.61–1.24)
GFR, Estimated: 12 mL/min — ABNORMAL LOW (ref >60)
Glucose, Bld: 117 mg/dL — ABNORMAL HIGH (ref 70–99)
Phosphorus: 7.2 mg/dL — ABNORMAL HIGH (ref 2.5–4.6)
Potassium: 5 mmol/L (ref 3.5–5.1)
Sodium: 136 mmol/L (ref 135–145)

## 2021-01-18 LAB — CBC
HCT: 25.4 % — ABNORMAL LOW (ref 39.0–52.0)
Hemoglobin: 8.1 g/dL — ABNORMAL LOW (ref 13.0–17.0)
MCH: 28.4 pg (ref 26.0–34.0)
MCHC: 31.9 g/dL (ref 30.0–36.0)
MCV: 89.1 fL (ref 80.0–100.0)
Platelets: 334 10*3/uL (ref 150–400)
RBC: 2.85 MIL/uL — ABNORMAL LOW (ref 4.22–5.81)
RDW: 18.6 % — ABNORMAL HIGH (ref 11.5–15.5)
WBC: 11.8 10*3/uL — ABNORMAL HIGH (ref 4.0–10.5)
nRBC: 0 % (ref 0.0–0.2)

## 2021-01-18 LAB — BPAM RBC
Blood Product Expiration Date: 202210232359
ISSUE DATE / TIME: 202210180817
Unit Type and Rh: 7300

## 2021-01-18 LAB — TYPE AND SCREEN
ABO/RH(D): B POS
Antibody Screen: NEGATIVE
Unit division: 0

## 2021-01-18 LAB — GLUCOSE, CAPILLARY
Glucose-Capillary: 103 mg/dL — ABNORMAL HIGH (ref 70–99)
Glucose-Capillary: 103 mg/dL — ABNORMAL HIGH (ref 70–99)
Glucose-Capillary: 111 mg/dL — ABNORMAL HIGH (ref 70–99)
Glucose-Capillary: 113 mg/dL — ABNORMAL HIGH (ref 70–99)
Glucose-Capillary: 114 mg/dL — ABNORMAL HIGH (ref 70–99)
Glucose-Capillary: 119 mg/dL — ABNORMAL HIGH (ref 70–99)

## 2021-01-18 MED ORDER — ERYTHROMYCIN 5 MG/GM OP OINT
TOPICAL_OINTMENT | Freq: Four times a day (QID) | OPHTHALMIC | Status: DC
  Administered 2021-01-19 – 2021-01-31 (×22): 1 via OPHTHALMIC
  Filled 2021-01-18: qty 3.5

## 2021-01-18 MED ORDER — TORSEMIDE 20 MG PO TABS
40.0000 mg | ORAL_TABLET | Freq: Every day | ORAL | Status: DC
  Administered 2021-01-18 – 2021-01-22 (×5): 40 mg
  Filled 2021-01-18 (×6): qty 2

## 2021-01-18 NOTE — Progress Notes (Signed)
[facility] NEPHROLOGY PROGRESS NOTE  Assessment/ Plan:  # AKI on CKD3: Underlying chronic kidney disease from hypertension (followed by Dr. Theador Hawthorne in [place]) and his acute kidney injury appears to be from ischemic ATN.    No acute need for dialysis.  Azotemia is low gfr, and high protein intake, it is not an indication for HD at this point.  We will continue daily lab, strict ins and outs and watch for renal recovery.    # Hypertension: Blood pressures currently well controlled on a combination of amlodipine, carvedilol and hydralazine.  Continue to hold ACE inhibitor and monitor with diuretic trial.  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  # Acute hypoxic respiratory failure/VDRF: managed by pulmonary team.  #Hyperkalemia, mild: daily Lokelma  Subjective:  SCr stable at 5.6, K 5.0, HCO3 19; BUN 122 UOP 1.1L BPs stable, On Jevity, Prosource, CCM started torsemide $RemoveBeforeDEI'40mg'[id number]$  PO/d S/p PEG earlier today ENT seeing for Trach  Objective Vital signs in last 24 hours: Vitals:   01/18/21 0700 01/18/21 0745 01/18/21 0800 01/18/21 0900  BP: 124/82  132/89 129/87  Pulse: 87  93 96  Resp: 19  (!) 30 (!) 31  Temp:   98.4 F (36.9 C)   TempSrc:   Axillary   SpO2: 96% 96% 91% 93%  Weight:      Height:       Weight change:   Intake/Output Summary (Last 24 hours) at 01/18/2021 1018 Last data filed at 01/18/2021 0900 Gross per 24 hour  Intake 1896 ml  Output 1185 ml  Net 711 ml        Labs: Basic Metabolic Panel: Recent Labs  Lab 01/16/21 0500 01/15/2021 0354 01/18/21 0431  NA 141 142 136  K 4.9 5.4* 5.0  CL 109 108 103  CO2 20* 21* 19*  GLUCOSE 127* 90 117*  BUN 92* 102* 122*  CREATININE 5.44* 5.60* 5.53*  CALCIUM 10.4* 10.3 9.8  PHOS 4.6 5.3* 7.2*    Liver Function Tests: Recent Labs  Lab 01/16/21 0500 01/28/2021 0354  01/18/21 0431  ALBUMIN 1.8* 1.7* 1.9*    No results for input(s): LIPASE, AMYLASE in the last 168 hours.  No results for input(s): AMMONIA in the last 168 hours. CBC: Recent Labs  Lab 01/12/21 0927 01/13/21 0334 01/14/21 0300 01/15/21 0256 01/16/21 0500 01/23/2021 0354 01/20/2021 1559 01/18/21 0431  WBC 9.6   < > 8.4 8.9 12.6* 14.2*  --  11.8*  NEUTROABS 7.0  --   --   --   --   --   --   --   HGB 8.2*   < > 7.7* 7.3* 7.1* 6.6* 8.0* 8.1*  HCT 27.2*   < > 25.8* 23.4* 22.6* 21.7* 25.8* 25.4*  MCV 96.1   < > 95.9 94.0 92.6 95.2  --  89.1  PLT 148*   < > 168 215 240 271  --  334   < > = values in this interval not displayed.    Cardiac Enzymes: No results for input(s): CKTOTAL, CKMB, CKMBINDEX, TROPONINI in the last 168 hours.  CBG: Recent Labs  Lab 01/15/2021 1542 01/16/2021 2006 01/14/2021 2352 01/18/21 0343 01/18/21 0758  GLUCAP 82 107* 114* 111* 114*     Iron Studies: No results for input(s): IRON, TIBC, TRANSFERRIN, FERRITIN in the last 72 hours. Studies/Results: No results found.  Medications: Infusions:  sodium chloride Stopped (01/14/21  1454)   feeding supplement (JEVITY 1.5 CAL/FIBER) 1,000 mL (01/26/2021 1814)    Scheduled Medications:  amLODipine  10 mg Per Tube Daily   artificial tears   Right Eye Q2H   vitamin C  2,000 mg Per Tube Daily   carvedilol  25 mg Per Tube BID WC   chlorhexidine gluconate (MEDLINE KIT)  15 mL Mouth Rinse BID   Chlorhexidine Gluconate Cloth  6 each Topical Daily   ciprofloxacin  2 drop Right Eye Q4H while awake   doxycycline  100 mg Per Tube Q12H   erythromycin   Right Eye QHS   feeding supplement (PROSource TF)  45 mL Per Tube BID   fentaNYL (SUBLIMAZE) injection  200 mcg Intravenous Once   fentaNYL (SUBLIMAZE) injection  50 mcg Intravenous Once   fiber  1 packet Per Tube BID   free water  200 mL Per Tube Q6H   heparin injection (subcutaneous)  5,000 Units Subcutaneous Q8H   hydrALAZINE  50 mg Per Tube Q8H   insulin aspart   0-6 Units Subcutaneous Q4H   mouth rinse  15 mL Mouth Rinse 10 times per day   midazolam  5 mg Intravenous Once   pantoprazole sodium  40 mg Per Tube Daily   propofol  50 mg Intravenous Once   sodium bicarbonate  650 mg Per Tube TID   sodium zirconium cyclosilicate  10 g Per Tube Daily   thiamine  100 mg Per Tube Daily   torsemide  40 mg Per Tube Daily   vecuronium  10 mg Intravenous Once    have reviewed scheduled and prn medications.  Physical Exam: General: Ill-looking male on mechanical ventilation, not responding.   Heart:RRR, s1s2 nl Lungs: Coarse breath sound bilateral. Abdomen:soft, Non-tender, non-distended Extremities:No edema Neurology: Not responding and not following command GU: Foley catheter in place  December Hedtke B Aeliana Spates 01/18/2021,10:18 AM  LOS: 12 days

## 2021-01-18 NOTE — Progress Notes (Signed)
Subjective Resting in bed. Right eye is taped partially closed.   EXAMINATION   VAsc unable due to limited responsiveness   Pupils:  OD: Miotic, Equal, round, reactive, no APD OS: Miotic, Equal, round, reactive, no APD   T(Pen): OD:  16  mm Hg OS:  17  mm Hg     Anterior segment penlight/20D with indirect Exam: Ext/Lids: floppy eyelids OU, R facial palsy with poor orbicularis function and 6-46m lagophthalmos, poor bells reflex OD. 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 Ophthalmology [facility]      PROCEDURE NOTE  Procedure:  Temporary Tarsorrhaphy, right eye  Preop Dx: Right upper and lower eyelid lagophthalmos with exposure keratitis Postop Dx:  Same  Anesthesia:  Topical proparacaine and 2% lidocaine with epinephrine  Informed consent having been obtained, a time out was performed with surgeon and 2 nurses present. The patient was prepped and draped in the normal sterile fashion local anesthesia was achieved with 2% lidocaine with epinephrine, delivered subcutaneously and subconjunctivally to the operative site.   A 6-0 Nylon suture was passed as a single horizontal mattress suture using IV tubing as buttresses superiorly and inferiorly to achieve central closure of the palpebral fissure. Care was taken to ensure the suture did not contact the ocular surface.  The eye was cleaned and dried, followed by application of erythromycin ointment.     Complications:  none  Surgeon:  SteLonia Skinnerstimated Blood Loss: <1 mL.  Specimen: None.  Complications: None.  Sponge, Needle, and Instrument Count: Correct

## 2021-01-18 NOTE — Progress Notes (Signed)
NAME:  Timothy Good, MRN:  [id number], DOB:  1965/08/12, LOS: 49 ADMISSION DATE:  01/23/2021, CONSULTATION DATE:  01/24/2021 REFERRING MD:  Curly [place], CHIEF COMPLAINT:  found down   History of Present Illness:  55 y/o male with multiple medical problems was found down by in a well call visit by police and was brought to [facility] with a diagnosis of pontine hemorrhage.  He was intubated for airway protection and then transferred to [facility] for further evaluation.  Pulmonary and critical care medicine was consulted for medical management in the setting of acute hemorrhagic stroke.     PMH - CKD -4  Significant Hospital Events: Including procedures, antibiotic start and stop dates in addition to other pertinent events   January 06, 2021 admission, intubation 10/8 MRI orbits appear normal 10/15 head CT no change in pontine hematoma, IVH decreased, no hydrocephalus  Interim History / Subjective:   Uneventful PEG placement yesterday.   Objective   Blood pressure 124/82, pulse 87, temperature 97.8 F (36.6 C), temperature source Axillary, resp. rate 19, height '6\' 2"'$  (1.88 m), weight 74.6 kg, SpO2 96 %.    Vent Mode: PSV;CPAP FiO2 (%):  [30 %] 30 % Set Rate:  [12 bmp] 12 bmp Vt Set:  [490 mL] 490 mL PEEP:  [5 cmH20] 5 cmH20 Pressure Support:  [10 cmH20] 10 cmH20 Plateau Pressure:  [16 cmH20-18 cmH20] 18 cmH20   Intake/Output Summary (Last 24 hours) at 01/18/2021 0817 Last data filed at 01/18/2021 0700 Gross per 24 hour  Intake 1776 ml  Output 1185 ml  Net 591 ml    Filed Weights   01/15/21 0332 01/15/21 1845 01/16/21 1800  Weight: 80.1 kg 74.2 kg 74.6 kg    Examination: General:  critically ill appearing on mech vent HEENT: MM pink/moist; right eye appears red with stable corneal opacity, bilateral proptosis right more than left, eye patch in place.; ETT in place. 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   Resolved Hospital Problem list   Induced hypernatremia Acute encephalopathy  Assessment & Plan:   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  Unlikely to have intact swallowing given pons and medullary involvement.  Plan:  -ENT consultation given large goiter   -Given relatively small size of bleed and patient's young age may have reasonable neurological recovery by 3 to 6 months. -Continue daily SBT -Continue enteral blood pressure medication  Best Practice (right click and "Reselect all SmartList Selections" daily)   Diet/type: tubefeeds DVT prophylaxis: SCD GI prophylaxis: H2B Lines: N/A Foley:  Yes, and it is still needed Code Status:  full code Last date of multidisciplinary goals of care discussion : Spoke to patient's daughter, [name], who agrees to tracheostomy and PEG tube as bridge to potential recovery.  She understands that best recovery will take up to 4 months and may not be complete.  CRITICAL CARE Performed by: Kipp Brood  Total critical care time: 35 minutes  Critical care time was exclusive of separately billable procedures and treating other patients.  Critical care was necessary to treat or prevent imminent or life-threatening deterioration.  Critical care was time spent personally by me on the following  activities: development of treatment plan with patient and/or surrogate as well as nursing, discussions with consultants, evaluation of patient's response to treatment, examination of patient, obtaining history from patient or surrogate, ordering and performing treatments and interventions, ordering and review of laboratory studies, ordering and review of radiographic studies, pulse oximetry, re-evaluation of patient's condition and participation in multidisciplinary rounds.  Kipp Brood, MD [facility] ICU Physician [place]  Pager: [phone] Mobile: [phone] After hours: [phone].

## 2021-01-18 NOTE — Consult Note (Signed)
ENT CONSULT:  Reason for Consult: Airway management Referring Physician: Critical care service  Timothy Good is an 55 y.o. male.  HPI: ENT service consulted for evaluation for tracheostomy.  Patient with acute pontine infarct admitted on 01/03/2021 to stroke and critical care services.  Patient with significant pontine injury and inability to adequately protect his airway.  He has findings of multinodular goiter and percutaneous tracheostomy was not an option given his neck findings.  ENT service consulted for placement of tracheostomy for long-term airway management.  Past Medical History:  Diagnosis Date   Chronic combined systolic and diastolic CHF (congestive heart failure) (HCC)    Echo 06/2010 EF 20-25%, akinesis of the inferior myocardium, severe hypokinesis of the anteroseptal myocardial and grade 3 diastolic dysfunction; Echo 01/2012 mod dilated LV, mod LVH, EF [id number], grade 2 diastolic dysfunction, mod LAE, mild RAE    CKD (chronic kidney disease)    stage IV    H/O cardiovascular stress test    Lexiscan Myoview 06/2010 was negative for inducible ischemia with an EF of 14%.   H/O medication noncompliance    due to inability to afford medications   Hypertension    Uncontrolled   Iron deficiency anemia    Protein in urine    3.9 gm Mar 2012; 1.6gm 01/2012    Past Surgical History:  Procedure Laterality Date   none      Family History  Problem Relation Age of Onset   Heart failure Mother 55       "died of a weak heart from smoking"   Other Other        No known heart disease    Social History:  reports that he has never smoked. He has never used smokeless tobacco. He reports that he does not drink alcohol and does not use drugs.  Allergies:  Allergies  Allergen Reactions   Penicillins Other (See Comments)    Lost mobility in legs for a week.     Medications: I have reviewed the patient's current medications.  Results for orders placed or performed during the  hospital encounter of 01/03/2021 (from the past 48 hour(s))  Glucose, capillary     Status: Abnormal   Collection Time: 01/16/21 11:49 AM  Result Value Ref Range   Glucose-Capillary 116 (H) 70 - 99 mg/dL    Comment: Glucose reference range applies only to samples taken after fasting for at least 8 hours.  Glucose, capillary     Status: Abnormal   Collection Time: 01/16/21  3:41 PM  Result Value Ref Range   Glucose-Capillary 117 (H) 70 - 99 mg/dL    Comment: Glucose reference range applies only to samples taken after fasting for at least 8 hours.  Glucose, capillary     Status: Abnormal   Collection Time: 01/16/21  7:54 PM  Result Value Ref Range   Glucose-Capillary 115 (H) 70 - 99 mg/dL    Comment: Glucose reference range applies only to samples taken after fasting for at least 8 hours.  Glucose, capillary     Status: None   Collection Time: 01/16/21 11:36 PM  Result Value Ref Range   Glucose-Capillary 98 70 - 99 mg/dL    Comment: Glucose reference range applies only to samples taken after fasting for at least 8 hours.  Glucose, capillary     Status: None   Collection Time: 01/29/2021  3:37 AM  Result Value Ref Range   Glucose-Capillary 85 70 - 99 mg/dL    Comment:  Glucose reference range applies only to samples taken after fasting for at least 8 hours.  CBC     Status: Abnormal   Collection Time: 12/31/2020  3:54 AM  Result Value Ref Range   WBC 14.2 (H) 4.0 - 10.5 K/uL   RBC 2.28 (L) 4.22 - 5.81 MIL/uL   Hemoglobin 6.6 (LL) 13.0 - 17.0 g/dL    Comment: REPEATED TO VERIFY THIS CRITICAL RESULT HAS VERIFIED AND BEEN CALLED TO E BROOME RN BY CANDACE HAYES ON 10 18 2022 AT [id number], AND HAS BEEN READ BACK.     HCT 21.7 (L) 39.0 - 52.0 %   MCV 95.2 80.0 - 100.0 fL   MCH 28.9 26.0 - 34.0 pg   MCHC 30.4 30.0 - 36.0 g/dL   RDW 14.6 11.5 - 15.5 %   Platelets 271 150 - 400 K/uL   nRBC 0.1 0.0 - 0.2 %    Comment: Performed at [facility] [address] [place], [place] [address]   Renal function panel     Status: Abnormal   Collection Time: 01/13/2021  3:54 AM  Result Value Ref Range   Sodium 142 135 - 145 mmol/L   Potassium 5.4 (H) 3.5 - 5.1 mmol/L   Chloride 108 98 - 111 mmol/L   CO2 21 (L) 22 - 32 mmol/L   Glucose, Bld 90 70 - 99 mg/dL    Comment: Glucose reference range applies only to samples taken after fasting for at least 8 hours.   BUN 102 (H) 6 - 20 mg/dL   Creatinine, Ser 5.60 (H) 0.61 - 1.24 mg/dL   Calcium 10.3 8.9 - 10.3 mg/dL   Phosphorus 5.3 (H) 2.5 - 4.6 mg/dL   Albumin 1.7 (L) 3.5 - 5.0 g/dL   GFR, Estimated 11 (L) >60 mL/min    Comment: (NOTE) Calculated using the CKD-EPI Creatinine Equation (2021)    Anion gap 13 5 - 15    Comment: Performed at [facility] [address], [place], [place] [address]  APTT     Status: Abnormal   Collection Time: 01/13/2021  3:54 AM  Result Value Ref Range   aPTT 50 (H) 24 - 36 seconds    Comment:        IF BASELINE aPTT IS ELEVATED, SUGGEST PATIENT RISK ASSESSMENT BE USED TO DETERMINE APPROPRIATE ANTICOAGULANT THERAPY. Performed at [facility], [place] [address], [place], [place] [address]   Protime-INR     Status: None   Collection Time: 01/16/2021  3:54 AM  Result Value Ref Range   Prothrombin Time 14.4 11.4 - 15.2 seconds   INR 1.1 0.8 - 1.2    Comment: (NOTE) INR goal varies based on device and disease states. Performed at [facility], [place] [address] [place], [place] [address]   ABO/Rh     Status: None   Collection Time: 01/22/2021  3:54 AM  Result Value Ref Range   ABO/RH(D)      B POS Performed at [facility] [address], [place], [place] [address]   Type and screen [place]     Status: None (Preliminary result)   Collection Time: 01/23/2021  6:33 AM  Result Value Ref Range   ABO/RH(D) B POS    Antibody Screen NEG    Sample Expiration 01/28/2021,2359    Unit Number [id number]    Blood Component Type RED CELLS,LR    Unit division 00  Status of Unit ISSUED    Transfusion Status OK TO TRANSFUSE    Crossmatch Result      Compatible Performed at [facility], [place] [address] [place], [place] [address]   Prepare RBC (crossmatch)     Status: None   Collection Time: 01/05/2021  6:33 AM  Result Value Ref Range   Order Confirmation      ORDER PROCESSED BY BLOOD BANK Performed at [facility], [place] [address] [place], [place] [address]   Glucose, capillary     Status: None   Collection Time: 01/14/2021  8:03 AM  Result Value Ref Range   Glucose-Capillary 87 70 - 99 mg/dL    Comment: Glucose reference range applies only to samples taken after fasting for at least 8 hours.  Glucose, capillary     Status: None   Collection Time: 01/19/2021 11:49 AM  Result Value Ref Range   Glucose-Capillary 87 70 - 99 mg/dL    Comment: Glucose reference range applies only to samples taken after fasting for at least 8 hours.  Glucose, capillary     Status: None   Collection Time: 01/24/2021  3:42 PM  Result Value Ref Range   Glucose-Capillary 82 70 - 99 mg/dL    Comment: Glucose reference range applies only to samples taken after fasting for at least 8 hours.  Hemoglobin and hematocrit, blood     Status: Abnormal   Collection Time: 12/31/2020  3:59 PM  Result Value Ref Range   Hemoglobin 8.0 (L) 13.0 - 17.0 g/dL   HCT 25.8 (L) 39.0 - 52.0 %    Comment: Performed at [facility], [place] [address] [place], [place] [address]  Glucose, capillary     Status: Abnormal   Collection Time: 01/24/2021  8:06 PM  Result Value Ref Range   Glucose-Capillary 107 (H) 70 - 99 mg/dL    Comment: Glucose reference range applies only to samples taken after fasting for at least 8 hours.  Glucose, capillary     Status: Abnormal   Collection Time: 01/25/2021 11:52 PM  Result Value Ref Range   Glucose-Capillary 114 (H) 70 - 99 mg/dL    Comment: Glucose reference range applies only to samples taken after fasting for at least 8 hours.  Glucose, capillary      Status: Abnormal   Collection Time: 01/18/21  3:43 AM  Result Value Ref Range   Glucose-Capillary 111 (H) 70 - 99 mg/dL    Comment: Glucose reference range applies only to samples taken after fasting for at least 8 hours.  CBC     Status: Abnormal   Collection Time: 01/18/21  4:31 AM  Result Value Ref Range   WBC 11.8 (H) 4.0 - 10.5 K/uL   RBC 2.85 (L) 4.22 - 5.81 MIL/uL   Hemoglobin 8.1 (L) 13.0 - 17.0 g/dL   HCT 25.4 (L) 39.0 - 52.0 %   MCV 89.1 80.0 - 100.0 fL   MCH 28.4 26.0 - 34.0 pg   MCHC 31.9 30.0 - 36.0 g/dL   RDW 18.6 (H) 11.5 - 15.5 %   Platelets 334 150 - 400 K/uL   nRBC 0.0 0.0 - 0.2 %    Comment: Performed at [facility] [address], [place], [place] [address]  Renal function panel     Status: Abnormal   Collection Time: 01/18/21  4:31 AM  Result Value Ref Range   Sodium 136 135 - 145 mmol/L   Potassium  5.0 3.5 - 5.1 mmol/L   Chloride 103 98 - 111 mmol/L   CO2 19 (L) 22 - 32 mmol/L   Glucose, Bld 117 (H) 70 - 99 mg/dL    Comment: Glucose reference range applies only to samples taken after fasting for at least 8 hours.   BUN 122 (H) 6 - 20 mg/dL   Creatinine, Ser 5.53 (H) 0.61 - 1.24 mg/dL   Calcium 9.8 8.9 - 10.3 mg/dL   Phosphorus 7.2 (H) 2.5 - 4.6 mg/dL   Albumin 1.9 (L) 3.5 - 5.0 g/dL   GFR, Estimated 12 (L) >60 mL/min    Comment: (NOTE) Calculated using the CKD-EPI Creatinine Equation (2021)    Anion gap 14 5 - 15    Comment: Performed at [facility] [address] [place], [place] [address]  Glucose, capillary     Status: Abnormal   Collection Time: 01/18/21  7:58 AM  Result Value Ref Range   Glucose-Capillary 114 (H) 70 - 99 mg/dL    Comment: Glucose reference range applies only to samples taken after fasting for at least 8 hours.    No results found.  [id number] systems reviewed and negative except as stated in HPI    Blood pressure 129/87, pulse 96, temperature 98.4 F (36.9 C), temperature source Axillary, resp. rate (!)  31, height '6\' 2"'$  (1.88 m), weight 74.6 kg, SpO2 93 %.  PHYSICAL EXAM: General appearance -patient awake and responsive.  Oral tracheal intubation with ventilator support. Mouth - mucous membranes moist, pharynx normal without lesions and endotracheal tube in place Neck - supple, no significant adenopathy, nodular goiter on direct palpation, otherwise normal-appearing neck anatomy  Studies Reviewed: None  Assessment/Plan: ENT service consulted for evaluation for tracheostomy.  Patient requiring chronic ventilatory/airway  support after pontine infarct.  Given his history and findings I recommended that we proceed with tracheostomy as scheduled for 01/04/2021.  Informed consent obtained verbally from the patient's daughter.  Jerrell Belfast 01/18/2021, 10:19 AM

## 2021-01-18 NOTE — Progress Notes (Signed)
NAME:  Timothy Good, MRN:  [id number], DOB:  Apr 01, 1966, LOS: 80 ADMISSION DATE:  01/27/2021, CONSULTATION DATE:  12/31/2020 REFERRING MD:  Curly [place], CHIEF COMPLAINT:  found down   History of Present Illness:  55 y/o male with multiple medical problems was found down by in a well call visit by police and was brought to [facility] with a diagnosis of pontine hemorrhage.  He was intubated for airway protection and then transferred to [facility] for further evaluation.  Pulmonary and critical care medicine was consulted for medical management in the setting of acute hemorrhagic stroke.     PMH - CKD -4  Significant Hospital Events: Including procedures, antibiotic start and stop dates in addition to other pertinent events   January 06, 2021 admission, intubation 10/8 MRI orbits appear normal 10/15 head CT no change in pontine hematoma, IVH decreased, no hydrocephalus  Interim History / Subjective:   Uneventful PEG placement this morning.   Objective   Blood pressure 124/82, pulse 87, temperature 97.8 F (36.6 C), temperature source Axillary, resp. rate 19, height '6\' 2"'$  (1.88 m), weight 74.6 kg, SpO2 96 %.    Vent Mode: PRVC FiO2 (%):  [30 %] 30 % Set Rate:  [12 bmp] 12 bmp Vt Set:  [490 mL] 490 mL PEEP:  [5 cmH20] 5 cmH20 Plateau Pressure:  [16 cmH20-18 cmH20] 18 cmH20   Intake/Output Summary (Last 24 hours) at 01/18/2021 0814 Last data filed at 01/18/2021 0700 Gross per 24 hour  Intake 1776 ml  Output 1185 ml  Net 591 ml    Filed Weights   01/15/21 0332 01/15/21 1845 01/16/21 1800  Weight: 80.1 kg 74.2 kg 74.6 kg    Examination: General:  critically ill appearing on mech vent HEENT: MM pink/moist; right eye appears red with stable corneal opacity, bilateral proptosis right more than left, eye patch in place.; ETT in place. Large nodular thyroid Neuro: Awake follows commands on right left hemiplegia, right side weak 3/5 CV: s1s2, NSR, no m/r/g PULM: No  accessory muscle use, clear breath sounds bilateral GI: soft, bsx4 active  Extremities: warm/dry, no edema  Skin: no rashes or lesions   POC US shows large goiter overlying tracheostomy.   Resolved Hospital Problem list   Induced hypernatremia Acute encephalopathy  Assessment & Plan:   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  Unlikely to have intact swallowing given pons and medullary involvement.  Plan:  -ENT consultation given large goiter   -Given relatively small size of bleed and patient's young age may have reasonable neurological recovery by 3 to 6 months. -Continue daily SBT -Continue enteral blood pressure medication  Best Practice (right click and "Reselect all SmartList Selections" daily)   Diet/type: tubefeeds DVT prophylaxis: SCD GI prophylaxis: H2B Lines: N/A Foley:  Yes, and it is still needed Code Status:  full code Last date of multidisciplinary goals of care discussion : Spoke to patient's daughter, [name], who agrees to tracheostomy and PEG tube as bridge to potential recovery.  She understands that best recovery will take up to 4 months and may not be complete.  CRITICAL CARE Performed by: Kipp Brood   Total critical care time: 35 minutes  Critical care time was exclusive of separately billable procedures and treating other patients.  Critical care was necessary to treat or prevent imminent or life-threatening deterioration.  Critical care was time spent personally by me on the following activities: development of treatment plan with patient  and/or surrogate as well as nursing, discussions with consultants, evaluation of patient's response to treatment, examination of patient, obtaining history from patient or surrogate, ordering and performing treatments and interventions, ordering and review of laboratory studies, ordering and review of radiographic studies, pulse oximetry, re-evaluation of patient's condition and participation in multidisciplinary rounds.  Kipp Brood, MD [facility] ICU Physician [place]  Pager: [phone] Mobile: [phone] After hours: [phone].

## 2021-01-18 NOTE — Progress Notes (Addendum)
STROKE TEAM PROGRESS NOTE   INTERVAL HISTORY RN is at the bedside for trach. Pt was still intubated. Elio Forget was not done due to multiple thyroid nodules. ENT consulted, will do trach in OR Friday. PEG done yesterday with trauma. Right eye still redness with edema, taped shut.      Vitals:   01/18/21 1000 01/18/21 1100 01/18/21 1134 01/18/21 1200  BP: 127/83 122/82  122/84  Pulse: 94 92  92  Resp: (!) 25 (!) 22  (!) 29  Temp:    98.9 F (37.2 C)  TempSrc:    Axillary  SpO2: 96% 95% 95% 93%  Weight:      Height:       CBC:  Recent Labs  Lab 01/12/21 0927 01/13/21 0334 01/14/2021 0354 01/08/2021 1559 01/18/21 0431  WBC 9.6   < > 14.2*  --  11.8*  NEUTROABS 7.0  --   --   --   --   HGB 8.2*   < > 6.6* 8.0* 8.1*  HCT 27.2*   < > 21.7* 25.8* 25.4*  MCV 96.1   < > 95.2  --  89.1  PLT 148*   < > 271  --  334   < > = values in this interval not displayed.   Basic Metabolic Panel:  Recent Labs  Lab 01/12/21 1403 01/13/21 0334 01/21/2021 0354 01/18/21 0431  NA  --    < > 142 136  K 4.7   < > 5.4* 5.0  CL  --    < > 108 103  CO2  --    < > 21* 19*  GLUCOSE  --    < > 90 117*  BUN  --    < > 102* 122*  CREATININE  --    < > 5.60* 5.53*  CALCIUM  --    < > 10.3 9.8  MG 1.8  --   --   --   PHOS  --    < > 5.3* 7.2*   < > = values in this interval not displayed.   Lipid Panel:  No results for input(s): CHOL, TRIG, HDL, CHOLHDL, VLDL, LDLCALC in the last 168 hours.  HgbA1c:  No results for input(s): HGBA1C in the last 168 hours.  Urine Drug Screen:  No results for input(s): LABOPIA, COCAINSCRNUR, LABBENZ, AMPHETMU, THCU, LABBARB in the last 168 hours.  Alcohol Level  No results for input(s): ETH in the last 168 hours.   IMAGING past 24 hours No results found.   PHYSICAL EXAM  Temp:  [97.4 F (36.3 C)-98.9 F (37.2 C)] 98.9 F (37.2 C) (10/19 1200) Pulse Rate:  [79-96] 92 (10/19 1200) Resp:  [17-33] 29 (10/19 1200) BP: (110-132)/(77-89) 122/84 (10/19  1200) SpO2:  [91 %-97 %] 93 % (10/19 1200) FiO2 (%):  [30 %] 30 % (10/19 0745)  General - Well nourished, well developed, intubated off sedation.   Ophthalmologic - fundi not visualized due to noncooperation.   Cardiovascular - Regular rate and rhythm.   Neuro - intubated off sedation, eyes close but open on voice, right eye protrusion, conjuctiva projection with edema, covered with dressing. Able to follow  simple commands on the right hand and foot. With eye opening, eyes in mid position, blinking to visual threat on the left eye, however, right eye not able to close, b/l eyes have horizontal gaze palsy, but able to have upward and downward gaze. Corneal reflex present on the left but not on the  right, gag and cough present weakly. Breathing over the vent.  Facial symmetry not able to test due to ET tube.  Tongue protrusion not cooperative. RUE against gravity without drift. RLE withdraw to pain, but hemiplegia on the left. Left no babinski. Sensation, coordination not cooperative and gait not tested.     ASSESSMENT/PLAN Timothy Good is a 55 y.o. male with a past medical history significant for uncontrolled hypertension, CKD stage IV, heart failure, hyperlipidemia, daily drinking, medication nonadherence, iron deficiency anemia/anemia of chronic disease, presenting with a pontine hemorrhage. ICH Score: 3.    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  CT head right pontine hemorrhage with midbrain and medulla extension, IVH at fourth ventricle MRI  Acute brainstem hemorrhage centered at the pons. Extensive pontine edema, which tracks associated edema which tracks into the midbrain right > left and into the right medullary pyramid. Mild extension of hemorrhage into the 4th ventricle and basilar subarachnoid spaces. But no ventriculomegaly. No midline shift or impending herniation. Negative noncontrast MRI appearance of the  orbits. CT repeat stable hematoma, no hydrocephalus Consider MRA to rule out AVM or aneurysm once hematoma resolves. 2D Echo EF 55 to 60%.  LDL 75 HgbA1c 5.4 VTE prophylaxis - Heparin subcu Not on Anticoagulant or antiplatelet prior to admission, now on no antithrombotics due to [place] Therapy recommendations:  CIR Disposition: Pending  Cerebral Edema Neurosurgery consulted, no acute intervention recommended at this time. Hypertonic saline infusion has been discontinued, allow sodium to trend down with daily check   Na 143->141->136  Acute respiratory failure Probable aspiration pneumonia Remains intubated, appreciate CCM management First on Unasyn->Rocephin->off Tmax 100.7->afebrile now Resp culture: few gram positive cocci PEG 10/18 with Trauma Trach planned Friday with ENT    Hypertensive emergency Home meds: none Stable now BP goal less than 160  Currently on Norvasc 10, Coreg 25, hydralazine 100 Q8 Long-term BP goal normotensive  Hyperlipidemia Home meds:  None LDL  75, almost at goal < 70 May consider statin at discharge  AKI on CKD Ischemic ATN Anemia  Creatinine 3.65-3.80-5.2-6.27-5.69-5.46-5.44-5.60-5.53 Hb 8.2->7.6->7.7->7.3->7.1->6.6->PRBC->8.1 On tube feeding and free water  Urine adequate No acute need for dialysis Nephrology on board   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 Roda Shutters help  ETOH use disorder with dependence Daily use of excessive liquor per family (?1/2 bottle vodka)  Thiamine, folate and MVI on board Monitor magnesium, 2.2->2.2->2.1 Vitamin B12 -213 Monitor s/s of withdrawal, need for CIWA   Dysphagia Continue tube feeding and free water  Speech on  board CT Chest Abd Pelvis: generalized gastric wall thickening, with associated fat stranding in the gastrohepatic ligament.Findings are nonspecific and could be due to noninflammatory edema,gastritis, peptic ulcer disease or neoplasm.  PEG done 10/18  Tobacco abuse Current smoker Smoking cessation counseling will be provided  Other Stroke Risk Factors Current Cigarette smoker, will be advised to stop smoking ETOH use disorder alcohol level <10, advised to drink no more than 2 drink(s) a day  Other Active Problems Multinodular goiter  Hospital day # 12  This patient is critically ill due to pontine ICH, respiratory failure, IVH, AKI on CKD, hypertensive emergency and at significant risk of neurological worsening, death form cerebral edema,  brain herniation, renal failure, right eye blind, sepsis. This patient's care requires constant monitoring of vital signs, hemodynamics, respiratory and cardiac monitoring, review of multiple databases, neurological assessment, discussion with family, other specialists and medical decision making of high complexity. I spent 35 minutes of neurocritical care time in the care of this patient. I discussed with Dr. Lynetta Mare.   Rosalin Hawking, MD PhD Stroke Neurology 01/18/2021 12:36 PM   To contact Stroke Continuity provider, please refer to [email or web address] After hours, contact General Neurology

## 2021-01-18 NOTE — Progress Notes (Signed)
Patient ID: Timothy Good, male   DOB: [birth year], 55 y.o.   MRN: [id number] PEG site OK. Abd benign. Continue binder loosely to protect PEG. I D/W Dr. Lynetta Mare at the bedside. ENT to eval for trach and multiple thyroid nodules.  Georganna Skeans, MD, MPH, FACS Please use [email or web address] to contact on call provider

## 2021-01-19 DIAGNOSIS — Z978 Presence of other specified devices: Secondary | ICD-10-CM | POA: Diagnosis not present

## 2021-01-19 DIAGNOSIS — I161 Hypertensive emergency: Secondary | ICD-10-CM | POA: Diagnosis not present

## 2021-01-19 DIAGNOSIS — I613 Nontraumatic intracerebral hemorrhage in brain stem: Secondary | ICD-10-CM | POA: Diagnosis not present

## 2021-01-19 DIAGNOSIS — J9601 Acute respiratory failure with hypoxia: Secondary | ICD-10-CM | POA: Diagnosis not present

## 2021-01-19 DIAGNOSIS — S0501XA Injury of conjunctiva and corneal abrasion without foreign body, right eye, initial encounter: Secondary | ICD-10-CM

## 2021-01-19 LAB — CBC WITH DIFFERENTIAL/PLATELET
Abs Immature Granulocytes: 0.56 10*3/uL — ABNORMAL HIGH (ref 0.00–0.07)
Basophils Absolute: 0 10*3/uL (ref 0.0–0.1)
Basophils Relative: 0 %
Eosinophils Absolute: 0.1 10*3/uL (ref 0.0–0.5)
Eosinophils Relative: 1 %
HCT: 23.9 % — ABNORMAL LOW (ref 39.0–52.0)
Hemoglobin: 7.6 g/dL — ABNORMAL LOW (ref 13.0–17.0)
Immature Granulocytes: 6 %
Lymphocytes Relative: 16 %
Lymphs Abs: 1.5 10*3/uL (ref 0.7–4.0)
MCH: 28.5 pg (ref 26.0–34.0)
MCHC: 31.8 g/dL (ref 30.0–36.0)
MCV: 89.5 fL (ref 80.0–100.0)
Monocytes Absolute: 1 10*3/uL (ref 0.1–1.0)
Monocytes Relative: 11 %
Neutro Abs: 6 10*3/uL (ref 1.7–7.7)
Neutrophils Relative %: 66 %
Platelets: 358 10*3/uL (ref 150–400)
RBC: 2.67 MIL/uL — ABNORMAL LOW (ref 4.22–5.81)
RDW: 18.3 % — ABNORMAL HIGH (ref 11.5–15.5)
WBC: 9.2 10*3/uL (ref 4.0–10.5)
nRBC: 0 % (ref 0.0–0.2)

## 2021-01-19 LAB — BASIC METABOLIC PANEL
Anion gap: 16 — ABNORMAL HIGH (ref 5–15)
BUN: 134 mg/dL — ABNORMAL HIGH (ref 6–20)
CO2: 20 mmol/L — ABNORMAL LOW (ref 22–32)
Calcium: 9.8 mg/dL (ref 8.9–10.3)
Chloride: 105 mmol/L (ref 98–111)
Creatinine, Ser: 5.9 mg/dL — ABNORMAL HIGH (ref 0.61–1.24)
GFR, Estimated: 11 mL/min — ABNORMAL LOW (ref >60)
Glucose, Bld: 113 mg/dL — ABNORMAL HIGH (ref 70–99)
Potassium: 5.3 mmol/L — ABNORMAL HIGH (ref 3.5–5.1)
Sodium: 141 mmol/L (ref 135–145)

## 2021-01-19 LAB — GLUCOSE, CAPILLARY
Glucose-Capillary: 112 mg/dL — ABNORMAL HIGH (ref 70–99)
Glucose-Capillary: 120 mg/dL — ABNORMAL HIGH (ref 70–99)
Glucose-Capillary: 106 mg/dL — ABNORMAL HIGH (ref 70–99)
Glucose-Capillary: 112 mg/dL — ABNORMAL HIGH (ref 70–99)
Glucose-Capillary: 101 mg/dL — ABNORMAL HIGH (ref 70–99)
Glucose-Capillary: 108 mg/dL — ABNORMAL HIGH (ref 70–99)

## 2021-01-19 MED ORDER — GLUCERNA 1.5 CAL PO LIQD
1000.0000 mL | ORAL | Status: AC
  Administered 2021-01-20: 1000 mL
  Filled 2021-01-19 (×4): qty 1000

## 2021-01-19 NOTE — Progress Notes (Signed)
STROKE TEAM PROGRESS NOTE   INTERVAL HISTORY RN is at the bedside for trach. Pt is still intubated.  Plan for tracheostomy tomorrow in the OR.  Ophthalmology Dr. Eulas Post performed temporary tarsorrhaphy last night.      Vitals:   01/19/21 0800 01/19/21 0900 01/19/21 1000 01/19/21 1100  BP: 116/72 116/72 106/68 109/69  Pulse: 90 90 86 84  Resp: 20 (!) 21 (!) 21 (!) 22  Temp:    99.9 F (37.7 C)  TempSrc:      SpO2: 95% 93% 96% 93%  Weight:      Height:       CBC:  Recent Labs  Lab 01/18/21 0431 01/19/21 0346  WBC 11.8* 9.2  NEUTROABS  --  6.0  HGB 8.1* 7.6*  HCT 25.4* 23.9*  MCV 89.1 89.5  PLT 334 [id number]   Basic Metabolic Panel:  Recent Labs  Lab 01/12/21 1403 01/13/21 0334 01/18/2021 0354 01/18/21 0431 01/19/21 0346  NA  --    < > 142 136 141  K 4.7   < > 5.4* 5.0 5.3*  CL  --    < > 108 103 105  CO2  --    < > 21* 19* 20*  GLUCOSE  --    < > 90 117* 113*  BUN  --    < > 102* 122* 134*  CREATININE  --    < > 5.60* 5.53* 5.90*  CALCIUM  --    < > 10.3 9.8 9.8  MG 1.8  --   --   --   --   PHOS  --    < > 5.3* 7.2*  --    < > = values in this interval not displayed.     IMAGING past 24 hours CT ABDOMEN PELVIS WO CONTRAST  Result Date: 01/07/2021 CLINICAL DATA:  Inpatient. Acute nonlocalized abdominal pain. Pontine hemorrhage. EXAM: CT ABDOMEN AND PELVIS WITHOUT CONTRAST TECHNIQUE: Multidetector CT imaging of the abdomen and pelvis was performed following the standard protocol without IV contrast. COMPARISON:  01/18/2021 abdominal radiograph. FINDINGS: Lower chest: Mild to moderate dependent bibasilar atelectasis. Coronary atherosclerosis. Hepatobiliary: Normal liver size. No liver mass. Prominently distended (6.4 cm diameter) gallbladder. No gallbladder wall thickening. No pericholecystic fluid. No radiopaque cholelithiasis. No biliary ductal dilatation. CBD diameter 5 mm. Pancreas: No pancreatic duct dilation or discrete pancreatic mass. Mild peripancreatic fat  stranding at the pancreatic head. No peripancreatic fluid collections. Spleen: Normal size. No mass. Adrenals/Urinary Tract: No discrete adrenal nodules. No hydronephrosis. No renal stones. Simple 1.8 cm lower right renal cyst. Scattered subcentimeter hyperdense and hypodense bilateral renal cortical lesions are too small to characterize and require no follow-up. Bladder completely collapsed by indwelling Foley catheter with expected gas in the nondependent bladder lumen. Stomach/Bowel: Enteric tube terminates in duodenal bulb. Stomach is collapsed. Suggestion of generalized gastric wall thickening, poorly assessed on this scan, with associated fat stranding in the gastrohepatic ligament. Normal caliber small bowel with no small bowel wall thickening. Normal appendix. Moderate diffuse colonic diverticulosis, with no large bowel wall thickening or significant pericolonic fat stranding. Vascular/Lymphatic: Atherosclerotic nonaneurysmal abdominal aorta. No pathologically enlarged lymph nodes in the abdomen or pelvis. Reproductive: Normal size prostate. Other: No pneumoperitoneum. Small volume ascites, predominantly perihepatic. No focal fluid collections. Symmetric mild-to-moderate bilateral gynecomastia. Musculoskeletal: No aggressive appearing focal osseous lesions. Mild thoracolumbar spondylosis. Chronic appearing mild T10 vertebral compression fracture. IMPRESSION: 1. Suggestion of generalized gastric wall thickening, poorly assessed on this scan due to gastric decompression  by enteric tube, with associated fat stranding in the gastrohepatic ligament. Findings are nonspecific and could be due to noninflammatory edema, gastritis, peptic ulcer disease or neoplasm. Consider upper endoscopic correlation as clinically warranted. 2. Small volume ascites, predominantly perihepatic. 3. Prominently distended gallbladder. No radiopaque cholelithiasis. No gallbladder wall thickening. No pericholecystic fluid. No biliary  ductal dilatation. 4. Moderate diffuse colonic diverticulosis. 5. Mild to moderate dependent bibasilar atelectasis. 6. Coronary atherosclerosis. 7. Chronic appearing mild T10 vertebral compression fracture. 8. Aortic Atherosclerosis (ICD10-I70.0). Electronically Signed   By: Ilona Sorrel M.D.   On: 01/07/2021 13:14   DG Chest 1 View  Result Date: 01/12/2021 CLINICAL DATA:  Intubation, stroke EXAM: CHEST  1 VIEW COMPARISON:  Portable exam 0942 hours compared to 01/30/2021 FINDINGS: Tip of endotracheal tube projects 3.0 cm above carina. Feeding tube extends into abdomen. Normal heart size mediastinal contours. Persistent RIGHT basilar atelectasis with improved aeration at LEFT base since previous exam. No new infiltrate, pleural effusion, or pneumothorax. Bones demineralized. IMPRESSION: Persistent mild RIGHT basilar atelectasis. Improved aeration at LEFT lung base since previous exam. Electronically Signed   By: Lavonia Dana M.D.   On: 01/12/2021 12:03   CT HEAD WO CONTRAST (5MM)  Result Date: 01/14/2021 CLINICAL DATA:  Stroke, follow up EXAM: CT HEAD WITHOUT CONTRAST TECHNIQUE: Contiguous axial images were obtained from the base of the skull through the vertex without intravenous contrast. COMPARISON:  CT head January 07, 2021. FINDINGS: Brain: When accounting for differences in technique (different plane of imaging on axial imaging), similar size an intraparenchymal hemorrhage within the pons, measuring up to approximately 2.0 by 3.2 by 2.6 cm (similar when remeasured). Surrounding edema in the brainstem appears similar. Intraventricular hemorrhage in the fourth ventricle is decreased. Similar effacement of the fourth ventricle without progressive ventriculomegaly to suggest obstructive hydrocephalus. No evidence of interval acute large vascular territory infarct. Patchy white matter hypoattenuation, compatible with advanced chronic microvascular ischemic disease that appears similar to prior. No midline  shift. Similar mild cerebral atrophy. Vascular: No hyperdense vessel identified. Skull: No acute fracture. Sinuses/Orbits: Mucosal thickening of scattered paranasal sinuses, similar. No acute orbital findings. Other: Small bilateral mastoid effusions. IMPRESSION: 1. When accounting for differences in technique, similar size of a recent intraparenchymal hemorrhage within the pons, as detailed above. Similar surrounding brainstem edema. Intraventricular hemorrhage in the fourth ventricle is decreased. Similar effacement of the fourth ventricle without progressive ventriculomegaly to suggest obstructive hydrocephalus. 2. Similar advanced chronic microvascular ischemic disease. Electronically Signed   By: Margaretha Sheffield M.D.   On: 01/14/2021 16:24   CT HEAD WO CONTRAST (5MM)  Result Date: 01/07/2021 CLINICAL DATA:  Stroke, follow-up 6 hour stability scan. EXAM: CT HEAD WITHOUT CONTRAST TECHNIQUE: Contiguous axial images were obtained from the base of the skull through the vertex without intravenous contrast. COMPARISON:  Brain MRI 01/07/2021. head CT 01/19/2021. FINDINGS: Brain: Mild generalized cerebral atrophy. Stable size of an acute parenchymal hemorrhage within the pons, again measuring 2.8 x 2.1 cm in transaxial dimensions and 2.6 cm in craniocaudal dimension. As before, there is surrounding edema within the brainstem with mass effect and partial fourth ventricular effacement. Unchanged small volume extension of hemorrhage into the fourth ventricle and posterior fossa subarachnoid spaces. No evidence of obstructive hydrocephalus at this time. Stable supratentorial chronic small vessel ischemic disease. No demarcated cortical infarct. Vascular: No hyperdense vessel.  Atherosclerotic calcifications. Skull: Normal. Negative for fracture or focal lesion. Sinuses/Orbits: Visualized orbits show no acute finding. Mild mucosal thickening within the bilateral ethmoid, sphenoid  and maxillary sinuses. IMPRESSION:  Stable size of an acute parenchymal hemorrhage within the pons. As before, there is surrounding edema within the brainstem with mass effect and partial effacement of the fourth ventricle. No evidence of obstructive hydrocephalus at this time. Also unchanged, there is small-volume extension of hemorrhage into the fourth ventricle and posterior fossa subarachnoid spaces. Stable background cerebral atrophy and chronic supratentorial chronic small vessel ischemic disease. Electronically Signed   By: Kellie Simmering D.O.   On: 01/07/2021 11:42   CT Head Wo Contrast  Result Date: 01/19/2021 CLINICAL DATA:  Mental status change.  Found unresponsive. EXAM: CT HEAD WITHOUT CONTRAST TECHNIQUE: Contiguous axial images were obtained from the base of the skull through the vertex without intravenous contrast. COMPARISON:  None. FINDINGS: Brain: There is a hyperdense mass within the brainstem measuring 2.8 by 2.1 by 2.6 cm compatible with hemorrhagic infarct. There is surrounding low attenuation edema. Posterior extension of clot is identified into the fourth ventricle and the cisterna magna, image 70/2. decreased CSF space is noted at the cerebellar pontine angle. There is moderate patchy low-attenuation within the subcortical and periventricular white matter compatible with chronic microvascular disease. Prominence of the sulci identified compatible with brain atrophy. Vascular: No hyperdense vessel or unexpected calcification. Skull: Normal. Negative for fracture or focal lesion. Sinuses/Orbits: Mild mucosal thickening is noted involving the maxillary sinuses. Mastoid air cells are clear. Other: None. IMPRESSION: 1. Examination is positive for hemorrhagic infarct within the brainstem. 2. Posterior extension of clot into the fourth ventricle and the cisterna magna. 3. Chronic small vessel ischemic disease and brain atrophy. Critical Value/emergent results were called by telephone at the time of interpretation on 01/24/2021 at  2:41 pm to provider [facility] , who verbally acknowledged these results. Electronically Signed   By: Kerby Moors M.D.   On: 01/28/2021 14:42   MR BRAIN WO CONTRAST  Result Date: 01/07/2021 CLINICAL DATA:  55 year old male with altered mental status, found unresponsive. Brainstem hemorrhage. Ophthalmoplegia. EXAM: MRI HEAD AND ORBITS WITHOUT CONTRAST TECHNIQUE: Multiplanar, multi-echo pulse sequences of the brain and surrounding structures were acquired without intravenous contrast. Multiplanar, multi-echo pulse sequences of the orbits and surrounding structures were acquired including fat saturation techniques, without intravenous contrast administration. COMPARISON:  Plain head CT 01/19/2021. FINDINGS: MRI HEAD FINDINGS Brain: Heterogeneous T2 and largely isointense T1 hemorrhage within the brainstem is expanding the pons and as seen by SWI tracks into the 4th ventricle. Estimated intra-axial hemorrhage size by MRI is 24 x 29 by 28 mm (AP by transverse by CC) for an estimated blood volume of 10 mL. Extensive pontine edema. On the dedicated orbit images edema is seen to track into the right medullary pyramid (series 8, image 7 of that exam). Edema also tracks cephalad into the midbrain greater on the right. Fourth ventricle size remains relatively normal. Punctate blood layering in the left occipital horn but no other lateral or 3rd intraventricular hemorrhage. No ventriculomegaly. No definite transependymal edema. Small volume of subarachnoid hemorrhage in the cisterna magna. SWI also demonstrates chronic microhemorrhages scattered in the cerebellum (especially on the left series 15, image 15) and bilateral deep gray matter nuclei (image 34. Occasional cerebral hemisphere microhemorrhages (image 35 on the left). Basilar cisterns remain patent.  No midline shift. DWI demonstrates no larger area of diffusion restriction. No chronic cortical encephalomalacia but moderate Patchy and confluent bilateral  cerebral white matter T2 and FLAIR hyperintensity in a pattern suggestive of chronic small vessel ischemia. Vascular: Major intracranial vascular flow voids  are preserved. Skull and upper cervical spine: Normal bone marrow signal. Negative visible cervical spine. Other: Mastoids are clear. Grossly normal visible internal auditory structures. Intubated. Right nasoenteric tube in place. Fluid in the pharynx. MRI ORBITS FINDINGS Orbits: Normal suprasellar cistern and optic chiasm. Optic nerves appear symmetric, within normal limits. No contrast administered. But there is no intraorbital fat stranding or inflammation identified. No intraorbital mass or mass effect identified. Globes appear normal. Unremarkable noncontrast cavernous sinus. Visualized sinuses: Scattered paranasal sinus fluid and mucosal thickening in the setting of intubation and right nasoenteric tube. Soft tissues: Negative visible deep soft tissue spaces of the face. IMPRESSION: 1. Acute brainstem hemorrhage centered at the pons with estimated intra-axial blood volume of 10 mL. Extensive pontine edema, which tracks associated edema which tracks into the midbrain right > left and into the right medullary pyramid. 2. Mild extension of hemorrhage into the 4th ventricle and basilar subarachnoid spaces. But no ventriculomegaly. No midline shift or impending herniation. 3. Underlying advanced chronic small vessel disease including multiple chronic microhemorrhages in the cerebellum and bilateral deep gray matter nuclei. Advanced bilateral cerebral white matter ischemia. 4. Negative noncontrast MRI appearance of the orbits. Electronically Signed   By: Genevie Ann M.D.   On: 01/07/2021 05:37   DG CHEST PORT 1 VIEW  Result Date: 01/13/2021 CLINICAL DATA:  Acute respiratory failure with hypoxia EXAM: PORTABLE CHEST 1 VIEW COMPARISON:  01/12/2021. FINDINGS: Endotracheal tube tip 3.8 cm above the carina. Enteric tube courses below the diaphragm in outside the  field of view. Similar streaky right basilar opacities, compatible with atelectasis. No new consolidation. No visible pleural effusions or pneumothorax. Similar cardiomediastinal silhouette. IMPRESSION: Similar right basilar atelectasis.  No new acute abnormality. Electronically Signed   By: Margaretha Sheffield M.D.   On: 01/13/2021 07:21   DG CHEST PORT 1 VIEW  Result Date: 01/10/2021 CLINICAL DATA:  Central line placement EXAM: PORTABLE CHEST 1 VIEW COMPARISON:  01/16/2021 FINDINGS: Endotracheal tube tip is about 4 cm superior to carina. Left IJ central venous catheter tip over the SVC. Esophageal tube tip below the diaphragm. No visible pneumothorax. Interval airspace disease at left base which may be due to atelectasis or aspiration. IMPRESSION: 1. Left IJ central venous catheter tip over the SVC. No pneumothorax 2. Interval airspace disease at left base which may be due to atelectasis or aspiration Electronically Signed   By: Donavan Foil M.D.   On: 01/16/2021 18:33   DG Chest Port 1 View  Result Date: 01/29/2021 CLINICAL DATA:  OG tube placement EXAM: PORTABLE CHEST 1 VIEW COMPARISON:  None. FINDINGS: Endotracheal tube with the tip 5.8 cm above the carina. Nasogastric tube coursing below the diaphragm with the tip excluded from the field of view. No focal consolidation. No pleural effusion or pneumothorax. Heart and mediastinal contours are unremarkable. No acute osseous abnormality. IMPRESSION: Endotracheal tube with the tip 5.8 cm above the carina. Nasogastric tube coursing below the diaphragm with the tip excluded from the field of view. Electronically Signed   By: Kathreen Devoid M.D.   On: 01/07/2021 17:18   DG Chest Port 1 View  Result Date: 01/09/2021 CLINICAL DATA:  A 55 year old male presents with sepsis now intubated. EXAM: PORTABLE CHEST 1 VIEW COMPARISON:  No recent comparisons, most recent prior is from October of 2013. FINDINGS: EKG leads project over the chest. A gastric tube courses  through in off the field of the radiograph. Side port is below GE junction, tip is not visualized. Endotracheal tube  at 2.5 cm from the carina. Cardiomediastinal contours are normal. RIGHT hemidiaphragm is elevated. This is slightly accentuated when compared to prior imaging but there is no sign of consolidation, effusion or pneumothorax. On limited assessment there is no acute skeletal process. Incidental note is made of what appears to be in hearing projecting over the RIGHT supraclavicular region and therefore more likely external to the patient. IMPRESSION: Endotracheal tube is at 2.5 cm from the carina. No acute cardiopulmonary process. Gastric tube courses through off the field of the radiograph. And earring projects over the RIGHT supraclavicular region, may be anterior or posterior to the patient. Electronically Signed   By: Zetta Bills M.D.   On: 01/10/2021 14:07   DG Abd Portable 1V  Result Date: 01/11/2021 CLINICAL DATA:  Nasogastric tube placement EXAM: PORTABLE ABDOMEN - 1 VIEW COMPARISON:  None. FINDINGS: Enteric feeding tube is positioned below the diaphragm with tip in the vicinity of the pylorus. Nonobstructive pattern of included bowel gas. IMPRESSION: Enteric feeding tube is positioned below the diaphragm with tip in the vicinity of the pylorus. Consider advancement if post pyloric positioning is desired. Electronically Signed   By: Delanna Ahmadi M.D.   On: 01/11/2021 13:21   DG Abd Portable 1V  Result Date: 01/09/2021 CLINICAL DATA:  NG tube placement EXAM: PORTABLE ABDOMEN - 1 VIEW COMPARISON:  KUB 01/14/2021 FINDINGS: The enteric catheter tip projects over the expected location of the pylorus/first portion of the duodenum. The side hole projects over the distal stomach. There is a nonobstructive bowel gas pattern to the level imaged. The lung bases are clear. IMPRESSION: Enteric catheter in appropriate position. Electronically Signed   By: Valetta Mole M.D.   On: 01/09/2021 08:49    DG Abd Portable 1V  Result Date: 01/05/2021 CLINICAL DATA:  NG tube EXAM: PORTABLE ABDOMEN - 1 VIEW COMPARISON:  None. FINDINGS: Esophageal tube tip overlies the distal stomach. Upper gas pattern is unremarkable IMPRESSION: Esophageal tube tip overlies the distal stomach Electronically Signed   By: Donavan Foil M.D.   On: 01/29/2021 18:34   ECHOCARDIOGRAM COMPLETE  Result Date: 01/07/2021    ECHOCARDIOGRAM REPORT   Patient Name:   HUBBARD CROSTON Date of Exam: 01/07/2021 Medical Rec #:  [id number]        Height:       74.0 in Accession #:    [id number]       Weight:       155.9 lb Date of Birth:  10/18/1965         BSA:          1.955 m Patient Age:    55 years         BP:           114/80 mmHg Patient Gender: M                HR:           92 bpm. Exam Location:  Inpatient Procedure: 2D Echo, Cardiac Doppler, Color Doppler and 3D Echo Indications:    Stroke I63.9  History:        Patient has prior history of Echocardiogram examinations, most                 recent 06/16/2015. CHF; Risk Factors:Hypertension. Chronic kidney                 disease.  Sonographer:    Darlina Sicilian RDCS Referring Phys: [id number] [place]  1. Left ventricular ejection fraction, by estimation, is 55 to 60%. The left ventricle has normal function. The left ventricle demonstrates regional wall motion abnormalities. Basal inferior akinesis.There is moderate asymmetric left ventricular hypertrophy of the basal-septal segment. Left ventricular diastolic parameters are consistent with Grade I diastolic dysfunction (impaired relaxation).  2. Right ventricular systolic function is normal. The right ventricular size is normal. Tricuspid regurgitation signal is inadequate for assessing PA pressure.  3. The mitral valve is normal in structure. No evidence of mitral valve regurgitation.  4. The aortic valve was not well visualized. Aortic valve regurgitation is mild. No aortic stenosis is present.  5. The inferior vena cava  is normal in size with greater than 50% respiratory variability, suggesting right atrial pressure of 3 mmHg. FINDINGS  Left Ventricle: Left ventricular ejection fraction, by estimation, is 55 to 60%. The left ventricle has normal function. The left ventricle demonstrates regional wall motion abnormalities. The left ventricular internal cavity size was normal in size. There is moderate asymmetric left ventricular hypertrophy of the basal-septal segment. Left ventricular diastolic parameters are consistent with Grade I diastolic dysfunction (impaired relaxation). Right Ventricle: The right ventricular size is normal. No increase in right ventricular wall thickness. Right ventricular systolic function is normal. Tricuspid regurgitation signal is inadequate for assessing PA pressure. Left Atrium: Left atrial size was normal in size. Right Atrium: Right atrial size was normal in size. Pericardium: There is no evidence of pericardial effusion. Mitral Valve: The mitral valve is normal in structure. No evidence of mitral valve regurgitation. Tricuspid Valve: The tricuspid valve is normal in structure. Tricuspid valve regurgitation is trivial. Aortic Valve: The aortic valve was not well visualized. Aortic valve regurgitation is mild. No aortic stenosis is present. Pulmonic Valve: The pulmonic valve was not well visualized. Pulmonic valve regurgitation is not visualized. Aorta: The aortic root is normal in size and structure. Venous: The inferior vena cava is normal in size with greater than 50% respiratory variability, suggesting right atrial pressure of 3 mmHg. IAS/Shunts: The interatrial septum was not well visualized.  LEFT VENTRICLE PLAX 2D LVIDd:         5.80 cm   Diastology LVIDs:         3.70 cm   LV e' medial:    6.64 cm/s LV PW:         0.95 cm   LV E/e' medial:  5.8 LV IVS:        0.95 cm   LV e' lateral:   7.62 cm/s LVOT diam:     2.30 cm   LV E/e' lateral: 5.1 LV SV:         66 LV SV Index:   34 LVOT Area:      4.15 cm                           3D Volume EF:                          3D EF:        50 %                          LV EDV:       109 ml                          LV ESV:  54 ml                          LV SV:        55 ml RIGHT VENTRICLE RV S prime:     18.30 cm/s TAPSE (M-mode): 1.7 cm LEFT ATRIUM             Index        RIGHT ATRIUM          Index LA diam:        3.10 cm 1.59 cm/m   RA Area:     7.93 cm LA Vol (A2C):   26.9 ml 13.76 ml/m  RA Volume:   10.80 ml 5.52 ml/m LA Vol (A4C):   25.7 ml 13.15 ml/m LA Biplane Vol: 25.6 ml 13.10 ml/m  AORTIC VALVE LVOT Vmax:   88.80 cm/s LVOT Vmean:  61.000 cm/s LVOT VTI:    0.159 m  AORTA Ao Root diam: 3.70 cm MITRAL VALVE MV Area (PHT): 3.42 cm    SHUNTS MV Decel Time: 222 msec    Systemic VTI:  0.16 m MV E velocity: 38.60 cm/s  Systemic Diam: 2.30 cm MV A velocity: 56.60 cm/s MV E/A ratio:  0.68 Oswaldo Milian MD Electronically signed by Oswaldo Milian MD Signature Date/Time: 01/07/2021/4:27:39 PM    Final    MR ORBITS WO CONTRAST  Result Date: 01/07/2021 CLINICAL DATA:  55 year old male with altered mental status, found unresponsive. Brainstem hemorrhage. Ophthalmoplegia. EXAM: MRI HEAD AND ORBITS WITHOUT CONTRAST TECHNIQUE: Multiplanar, multi-echo pulse sequences of the brain and surrounding structures were acquired without intravenous contrast. Multiplanar, multi-echo pulse sequences of the orbits and surrounding structures were acquired including fat saturation techniques, without intravenous contrast administration. COMPARISON:  Plain head CT 01/09/2021. FINDINGS: MRI HEAD FINDINGS Brain: Heterogeneous T2 and largely isointense T1 hemorrhage within the brainstem is expanding the pons and as seen by SWI tracks into the 4th ventricle. Estimated intra-axial hemorrhage size by MRI is 24 x 29 by 28 mm (AP by transverse by CC) for an estimated blood volume of 10 mL. Extensive pontine edema. On the dedicated orbit images edema is seen to track  into the right medullary pyramid (series 8, image 7 of that exam). Edema also tracks cephalad into the midbrain greater on the right. Fourth ventricle size remains relatively normal. Punctate blood layering in the left occipital horn but no other lateral or 3rd intraventricular hemorrhage. No ventriculomegaly. No definite transependymal edema. Small volume of subarachnoid hemorrhage in the cisterna magna. SWI also demonstrates chronic microhemorrhages scattered in the cerebellum (especially on the left series 15, image 15) and bilateral deep gray matter nuclei (image 34. Occasional cerebral hemisphere microhemorrhages (image 35 on the left). Basilar cisterns remain patent.  No midline shift. DWI demonstrates no larger area of diffusion restriction. No chronic cortical encephalomalacia but moderate Patchy and confluent bilateral cerebral white matter T2 and FLAIR hyperintensity in a pattern suggestive of chronic small vessel ischemia. Vascular: Major intracranial vascular flow voids are preserved. Skull and upper cervical spine: Normal bone marrow signal. Negative visible cervical spine. Other: Mastoids are clear. Grossly normal visible internal auditory structures. Intubated. Right nasoenteric tube in place. Fluid in the pharynx. MRI ORBITS FINDINGS Orbits: Normal suprasellar cistern and optic chiasm. Optic nerves appear symmetric, within normal limits. No contrast administered. But there is no intraorbital fat stranding or inflammation identified. No intraorbital mass or mass effect identified. Globes appear normal. Unremarkable noncontrast cavernous sinus. Visualized sinuses: Scattered  paranasal sinus fluid and mucosal thickening in the setting of intubation and right nasoenteric tube. Soft tissues: Negative visible deep soft tissue spaces of the face. IMPRESSION: 1. Acute brainstem hemorrhage centered at the pons with estimated intra-axial blood volume of 10 mL. Extensive pontine edema, which tracks associated  edema which tracks into the midbrain right > left and into the right medullary pyramid. 2. Mild extension of hemorrhage into the 4th ventricle and basilar subarachnoid spaces. But no ventriculomegaly. No midline shift or impending herniation. 3. Underlying advanced chronic small vessel disease including multiple chronic microhemorrhages in the cerebellum and bilateral deep gray matter nuclei. Advanced bilateral cerebral white matter ischemia. 4. Negative noncontrast MRI appearance of the orbits. Electronically Signed   By: Genevie Ann M.D.   On: 01/07/2021 05:37      PHYSICAL EXAM  Temp:  [99.5 F (37.5 C)-101.1 F (38.4 C)] 99.9 F (37.7 C) (10/20 1100) Pulse Rate:  [84-101] 84 (10/20 1100) Resp:  [18-29] 22 (10/20 1100) BP: (106-129)/(68-87) 109/69 (10/20 1100) SpO2:  [90 %-98 %] 93 % (10/20 1100) FiO2 (%):  [30 %] 30 % (10/20 0800)  General - Well nourished, well developed, intubated off sedation.   Ophthalmologic - fundi not visualized due to noncooperation.   Cardiovascular - Regular rate and rhythm.   Neuro - intubated off sedation, lethargic, however able to open eyes on voice, right eye status post temporary tarsorrhaphy. Able to follow  simple commands on the right hand and foot. With left eye opening, eye in mid position, blinking to visual threat on the left eye, horizontal gaze palsy, but able to have upward and downward gaze. Corneal reflex present on the left, gag and cough present weakly. Breathing over the vent.  Facial symmetry not able to test due to ET tube.  Tongue protrusion not cooperative. RUE against barely gravity. RLE withdraw to pain, but hemiplegia on the left. Left no babinski. Sensation, coordination not cooperative and gait not tested.     ASSESSMENT/PLAN Timothy Good is a 55 y.o. male with a past medical history significant for uncontrolled hypertension, CKD stage IV, heart failure, hyperlipidemia, daily drinking, medication nonadherence, iron deficiency  anemia/anemia of chronic disease, presenting with a pontine hemorrhage. ICH Score: 3.    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  CT head right pontine hemorrhage with midbrain and medulla extension, IVH at fourth ventricle MRI  Acute brainstem hemorrhage centered at the pons. Extensive pontine edema, which tracks associated edema which tracks into the midbrain right > left and into the right medullary pyramid. Mild extension of hemorrhage into the 4th ventricle and basilar subarachnoid spaces. But no ventriculomegaly. No midline shift or impending herniation. Negative noncontrast MRI appearance of the orbits. CT repeat stable hematoma, no hydrocephalus Consider MRA to rule out AVM or aneurysm once hematoma resolves. 2D Echo EF 55 to 60%.  LDL 75 HgbA1c 5.4 VTE prophylaxis - Heparin subcu Not on Anticoagulant or antiplatelet prior to admission, now on no antithrombotics due to [employer] Therapy recommendations:  CIR Disposition: Pending  Cerebral Edema Neurosurgery consulted, no acute intervention recommended at this time. Hypertonic saline infusion has been discontinued, allow sodium to trend down with daily check   Na 143->141->136  Acute respiratory failure Probable aspiration pneumonia Remains intubated, appreciate CCM management First on Unasyn->Rocephin->off Tmax 100.7->afebrile now Resp culture: few gram positive cocci PEG 10/18 with Trauma Trach planned Friday with ENT    Hypertensive emergency Home meds: none  Stable now BP goal less than 160  Currently on Norvasc 10, Coreg 25, hydralazine 100 Q8 Long-term BP goal normotensive  Hyperlipidemia Home meds:  None LDL  75, almost at goal < 70 May consider statin at discharge  AKI on CKD Ischemic ATN Anemia  Creatinine 3.65-3.80-5.2-6.27-5.69-5.46-5.44-5.60-5.53->5.90 Hb 8.2->7.6->7.7->7.3->7.1->6.6->PRBC->8.1->7.6 On tube feeding and free water  Urine  adequate No acute need for dialysis Nephrology on board   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 Eulas Post Cipro eyedrop during the day and erythromycin ointment nightly Continue Vit C 2g and docxy 100 bid Appreciate ophthal Dr. Roda Shutters help  ETOH use disorder with dependence Daily use of excessive liquor per family (?1/2 bottle vodka)  Thiamine, folate and MVI on board Monitor magnesium, 2.2->2.2->2.1 Vitamin B12 -213 Monitor s/s of withdrawal, need for CIWA   Dysphagia Continue tube feeding and free water  Speech on board CT Chest Abd Pelvis: generalized gastric wall thickening, with associated fat stranding in the gastrohepatic ligament.Findings are nonspecific and could be due to noninflammatory edema,gastritis, peptic ulcer disease or neoplasm.  PEG done 10/18  Tobacco abuse Current smoker Smoking cessation counseling will be provided  Other Stroke Risk Factors Current Cigarette smoker, will be advised to stop smoking ETOH use disorder alcohol level <10, advised to drink no more than 2 drink(s) a day  Other Active Problems Multinodular goiter  Hospital day # 13  This patient is critically ill due to pontine ICH, respiratory failure, IVH, AKI on CKD, right eye corneal abrasion, hypertensive emergency and at significant risk of neurological worsening, death form brain herniation, renal failure, right eye blind, sepsis, hydrocephalus. This patient's care requires constant monitoring of vital signs, hemodynamics, respiratory and cardiac monitoring, review of multiple databases, neurological assessment, discussion with family, other specialists and medical decision making of high complexity. I spent 30 minutes of neurocritical care time in the care of this patient. I discussed with Dr. Lynetta Mare.   Rosalin Hawking, MD PhD Stroke  Neurology 01/19/2021 12:32 PM   To contact Stroke Continuity provider, please refer to [email or web address] After hours, contact General Neurology

## 2021-01-19 NOTE — Op Note (Signed)
[facility] Patient Name: Timothy Good Procedure Date : 01/14/2021 MRN: [id number] Attending MD: Georganna Skeans , MD Date of Birth: 05-23-1965 CSN: [id number] Age: 55 Admit Type: Inpatient Procedure:                Upper GI endoscopy Indications:              Place PEG because patient is unable to eat due to                            stroke (CVA) Providers:                Georganna Skeans, MD, Melina Modena, PA-C, Faustina                            Mbumina, Technician Referring MD:              Medicines:                Fentanyl 100 micrograms IV, Midazolam 2 mg IV Complications:            No immediate complications. Estimated Blood Loss:     Estimated blood loss was minimal. Procedure:                Pre-Anesthesia Assessment:                           - Prior to the procedure, a History and Physical                            was performed, and patient medications and                            allergies were reviewed. The patient is unable to                            give consent secondary to the patient's altered                            mental status. The risks and benefits of the                            procedure and the sedation options and risks were                            discussed with the patient. All questions were                            answered and informed consent was obtained. Patient                            identification and proposed procedure were verified                            by the physician, the nurse and the technician in  the procedure room. Prophylactic Antibiotics: The                            patient does not require prophylactic antibiotics.                            Prior Anticoagulants: The patient has taken no                            previous anticoagulant or antiplatelet agents. ASA                            Grade Assessment: III - A patient with severe                             systemic disease. After reviewing the risks and                            benefits, the patient was deemed in satisfactory                            condition to undergo the procedure. The anesthesia                            plan was to use deep sedation / analgesia.                            Immediately prior to administration of medications,                            the patient was re-assessed for adequacy to receive                            sedatives. The heart rate, respiratory rate, oxygen                            saturations, blood pressure, adequacy of pulmonary                            ventilation, and response to care were monitored                            throughout the procedure. The physical status of                            the patient was re-assessed after the procedure.                           After obtaining informed consent, the endoscope was                            passed under direct vision. Throughout the  procedure, the patient's blood pressure, pulse, and                            oxygen saturations were monitored continuously. The                            [id number] Olympus endoscope was introduced                            through the mouth, and advanced to the second part                            of duodenum. The upper GI endoscopy was                            accomplished without difficulty. The patient                            tolerated the procedure well. Scope In: Scope Out: Findings:      No gross lesions were noted in the esophagus.      No gross lesions were noted in the stomach. Placement of an externally       removable PEG with no T-fasteners was successfully completed. The       external bumper was at the 3.5 cm marking on the tube. Estimated blood       loss was minimal.      No gross lesions were noted in the second portion of the duodenum. Impression:               - No gross  lesions in esophagus.                           - No gross lesions in the stomach.                           - No gross lesions in the second portion of the                            duodenum.                           - An externally removable PEG placement was                            successfully completed.                           - No specimens collected. Recommendation:            Procedure Code(s):        --- Professional ---                           [phone], Esophagogastroduodenoscopy, flexible,                            transoral; with directed placement of percutaneous  gastrostomy tube Diagnosis Code(s):        --- Professional ---                           [id number], Other sequelae of cerebral infarction                           R63.3, Feeding difficulties                           Z43.1, Encounter for attention to gastrostomy CPT copyright 2019 [employer]. All rights reserved. The codes documented in this report are preliminary and upon coder review may  be revised to meet current compliance requirements. Georganna Skeans, MD 01/19/2021 10:08:57 AM This report has been signed electronically. Number of Addenda: 0

## 2021-01-19 NOTE — Progress Notes (Signed)
Physical Therapy Treatment Patient Details Name: Timothy Good MRN: [id number] DOB: 1965-12-04 Today's Date: 01/19/2021   History of Present Illness 55 y.o. male presents to [facility] hospital on 01/25/2021, found down during a well check. Pt was emergently intubated and head CT revealed pontine hemorrhage. Intubated 10/7. PEG placed 10/19. PMH includes HTN, CKD stage IV, HLD, alcohol abuse, anemia.    PT Comments    Patient initially following commands inconsistently. Placed in chair position to increase arousal. By end of session, patient following commands appropriately and consistently with R side. Performed AAROM to R UE/LE. No movement noted in L side this session. PT will continue to follow. Plan for trach on 10/21.     Recommendations for follow up therapy are one component of a multi-disciplinary discharge planning process, led by the attending physician.  Recommendations may be updated based on patient status, additional functional criteria and insurance authorization.  Follow Up Recommendations  CIR;Supervision/Assistance - 24 hour     Equipment Recommendations  Wheelchair (measurements PT);Wheelchair cushion (measurements PT);Hospital bed;Other (comment) (hoyer lift)    Recommendations for Other Services       Precautions / Restrictions Precautions Precautions: Fall Precaution Comments: intubated. On CPAP Restrictions Weight Bearing Restrictions: No     Mobility  Bed Mobility               General bed mobility comments: placed in chair position to increase arousal and participation    Transfers                    Ambulation/Gait                 Stairs             Wheelchair Mobility    Modified Rankin (Stroke Patients Only)       Balance                                            Cognition Arousal/Alertness: Awake/alert Behavior During Therapy: Flat affect Overall Cognitive Status: Difficult to assess                                  General Comments: following commands intermittently initially but by end of session, patien following commands consistently with R UE/LE      Exercises Other Exercises Other Exercises: AAROM R UE/LE Other Exercises: PROM L UE    General Comments        Pertinent Vitals/Pain Pain Assessment: Faces Faces Pain Scale: No hurt Pain Intervention(s): Monitored during session    Home Living                      Prior Function            PT Goals (current goals can now be found in the care plan section) Acute Rehab PT Goals Patient Stated Goal: Pt unable to state 2/2 intubated, PT Goal Formulation: Patient unable to participate in goal setting Time For Goal Achievement: 01/22/21 Potential to Achieve Goals: Fair Progress towards PT goals: Progressing toward goals    Frequency    Min 3X/week (until off vent)      PT Plan Current plan remains appropriate    Co-evaluation  AM-PAC PT "6 Clicks" Mobility   Outcome Measure  Help needed turning from your back to your side while in a flat bed without using bedrails?: Total Help needed moving from lying on your back to sitting on the side of a flat bed without using bedrails?: Total Help needed moving to and from a bed to a chair (including a wheelchair)?: Total Help needed standing up from a chair using your arms (e.g., wheelchair or bedside chair)?: Total Help needed to walk in hospital room?: Total Help needed climbing 3-5 steps with a railing? : Total 6 Click Score: 6    End of Session Equipment Utilized During Treatment: Other (comment) (vent) Activity Tolerance: Patient tolerated treatment well Patient left: in bed;with call bell/phone within reach;with nursing/sitter in room Nurse Communication: Mobility status;Need for lift equipment PT Visit Diagnosis: Other abnormalities of gait and mobility (R26.89);Muscle weakness (generalized) (M62.81);Other  symptoms and signs involving the nervous system (R29.898)     Time: 1540-1600 PT Time Calculation (min) (ACUTE ONLY): 20 min  Charges:  $Therapeutic Exercise: 8-22 mins                     Ilana Prezioso A. Gilford Rile PT, DPT Acute Rehabilitation Services Pager [phone] Office [phone]    Linna Hoff 01/19/2021, 5:09 PM

## 2021-01-19 NOTE — Progress Notes (Signed)
NAME:  Timothy Good, MRN:  [id number], DOB:  [birth year], LOS: 66 ADMISSION DATE:  01/09/2021, CONSULTATION DATE:  01/21/2021 REFERRING MD:  Curly [place], CHIEF COMPLAINT:  found down   History of Present Illness:  55 y/o male with multiple medical problems was found down by in a well call visit by police and was brought to [facility] with a diagnosis of pontine hemorrhage.  He was intubated for airway protection and then transferred to [facility] for further evaluation.  Pulmonary and critical care medicine was consulted for medical management in the setting of acute hemorrhagic stroke.     PMH - CKD -4  Significant Hospital Events: Including procedures, antibiotic start and stop dates in addition to other pertinent events   January 06, 2021 admission, intubation 10/8 MRI orbits appear normal 10/15 head CT no change in pontine hematoma, IVH decreased, no hydrocephalus  Interim History / Subjective:   For trach by ENT 10/21  Objective   Blood pressure 105/65, pulse 83, temperature (!) 102.4 F (39.1 C), temperature source Axillary, resp. rate (!) 21, height '6\' 2"'$  (1.88 m), weight 74.6 kg, SpO2 95 %.    Vent Mode: PRVC FiO2 (%):  [30 %] 30 % Set Rate:  [12 bmp] 12 bmp Vt Set:  [490 mL] 490 mL PEEP:  [5 cmH20] 5 cmH20 Pressure Support:  [10 cmH20] 10 cmH20 Plateau Pressure:  [15 cmH20-16 cmH20] 15 cmH20   Intake/Output Summary (Last 24 hours) at 01/19/2021 1720 Last data filed at 01/19/2021 1200 Gross per 24 hour  Intake 1975 ml  Output 1095 ml  Net 880 ml    Filed Weights   01/15/21 0332 01/15/21 1845 01/16/21 1800  Weight: 80.1 kg 74.2 kg 74.6 kg    Examination: General:  critically ill appearing on mech vent HEENT: MM pink/moist; right eye appears red with stable corneal opacity, bilateral proptosis right more than left, eye patch in place.  Lid sutured ; ETT in place. Large nodular thyroid Neuro: Awake follows commands on right left hemiplegia, right  side weak 3/5 CV: s1s2, NSR, no m/r/g PULM: No accessory muscle use, clear breath sounds bilateral GI: soft, bsx4 active, PEG intact Extremities: warm/dry, no edema  Skin: no rashes or lesions   POC US shows large goiter overlying tracheostomy.   Resolved Hospital Problem list   Induced hypernatremia Acute encephalopathy  Assessment & Plan:   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  Unlikely to have intact swallowing given pons and medullary involvement.  Plan:  -ENT to trach Friday proceed to trach collar thereafter.  -Given relatively small size of bleed and patient's young age may have reasonable neurological recovery by 3 to 6 months. -Continue daily SBT -Continue enteral blood pressure medication  Best Practice (right click and "Reselect all SmartList Selections" daily)   Diet/type: tubefeeds DVT prophylaxis: SCD GI prophylaxis: H2B Lines: N/A Foley:  Yes, and it is still needed Code Status:  full code Last date of multidisciplinary goals of care discussion : Spoke to patient's daughter, [name], who agrees to tracheostomy and PEG tube as bridge to potential recovery.  She understands that best recovery will take up to 4 months and may not be complete.  CRITICAL CARE Performed by: Kipp Brood   Total critical care time: 35 minutes  Critical care time was exclusive of separately billable procedures and treating other patients.  Critical care was necessary to treat or prevent imminent or life-threatening deterioration.  Critical care was time spent personally  by me on the following activities: development of treatment plan with patient and/or surrogate as well as nursing, discussions with consultants, evaluation of patient's response to treatment, examination of patient, obtaining history from patient or surrogate, ordering and performing treatments and interventions, ordering and review of laboratory studies, ordering and review of radiographic studies, pulse oximetry, re-evaluation of patient's condition and participation in multidisciplinary rounds.  Kipp Brood, MD [facility] ICU Physician [place]  Pager: [phone] Mobile: [phone] After hours: [phone].

## 2021-01-19 NOTE — Progress Notes (Signed)
[facility] NEPHROLOGY PROGRESS NOTE  Assessment/ Plan:  # AKI on CKD3: Underlying chronic kidney disease from hypertension (followed by Dr. Theador Hawthorne in [place]) and his acute kidney injury appears to be from ischemic ATN.    No acute need for dialysis but seems increasingly likley.  He is responsive to diuretics as of now.    Azotemia is low gfr, and high protein intake, it is not an indication for HD at this point.  We will continue daily lab, strict ins and outs and watch for renal recovery.  Trach + HD is difficult,  I would suggest we involve palliative care at this time as well.    # Hypertension: Blood pressures currently well controlled on a combination of amlodipine, carvedilol and hydralazine.  Continue to hold ACE inhibitor and monitor with diuretic trial.  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  # Acute hypoxic respiratory failure/VDRF: managed by pulmonary team.  #Hyperkalemia, mild: daily Lokelma  Subjective:  SCr stable at 5.9, K 5.3, HCO3 20; BUN 134 UOP 1.7L using torsemide BPs stable, On Jevity, Prosource, S/p PEG earlier today ENT seeing for Trach, scheduled tomorrow  Objective Vital signs in last 24 hours: Vitals:   01/19/21 0514 01/19/21 0600 01/19/21 0700 01/19/21 0737  BP: 112/69 118/72 114/72   Pulse:  91 91   Resp:  20 18   Temp:    99.5 F (37.5 C)  TempSrc:      SpO2:  96% 93% 95%  Weight:      Height:       Weight change:   Intake/Output Summary (Last 24 hours) at 01/19/2021 1033 Last data filed at 01/19/2021 0700 Gross per 24 hour  Intake 2295 ml  Output 1845 ml  Net 450 ml        Labs: Basic Metabolic Panel: Recent Labs  Lab 01/16/21 0500 01/11/2021 0354 01/18/21 0431 01/19/21 0346  NA 141 142 136 141  K 4.9 5.4* 5.0 5.3*  CL 109 108 103 105  CO2 20* 21* 19* 20*  GLUCOSE 127* 90 117* 113*   BUN 92* 102* 122* 134*  CREATININE 5.44* 5.60* 5.53* 5.90*  CALCIUM 10.4* 10.3 9.8 9.8  PHOS 4.6 5.3* 7.2*  --     Liver Function Tests: Recent Labs  Lab 01/16/21 0500 01/14/2021 0354 01/18/21 0431  ALBUMIN 1.8* 1.7* 1.9*    No results for input(s): LIPASE, AMYLASE in the last 168 hours.  No results for input(s): AMMONIA in the last 168 hours. CBC: Recent Labs  Lab 01/15/21 0256 01/16/21 0500 01/20/2021 0354 01/24/2021 1559 01/18/21 0431 01/19/21 0346  WBC 8.9 12.6* 14.2*  --  11.8* 9.2  NEUTROABS  --   --   --   --   --  6.0  HGB 7.3* 7.1* 6.6* 8.0* 8.1* 7.6*  HCT 23.4* 22.6* 21.7* 25.8* 25.4* 23.9*  MCV 94.0 92.6 95.2  --  89.1 89.5  PLT 215 240 271  --  334 358    Cardiac Enzymes: No results for input(s): CKTOTAL, CKMB, CKMBINDEX, TROPONINI in the last 168 hours.  CBG: Recent Labs  Lab 01/18/21 1548 01/18/21 2011 01/18/21 2353 01/19/21 0401 01/19/21 0731  GLUCAP 113* 103* 103* 112* 120*     Iron Studies: No results for input(s): IRON, TIBC, TRANSFERRIN, FERRITIN in the last 72 hours. Studies/Results: No results found.  Medications: Infusions:  sodium chloride Stopped (01/14/21 1454)  feeding supplement (GLUCERNA 1.5 CAL)      Scheduled Medications:  amLODipine  10 mg Per Tube Daily   vitamin C  2,000 mg Per Tube Daily   carvedilol  25 mg Per Tube BID WC   chlorhexidine gluconate (MEDLINE KIT)  15 mL Mouth Rinse BID   Chlorhexidine Gluconate Cloth  6 each Topical Daily   ciprofloxacin  2 drop Right Eye Q4H while awake   doxycycline  100 mg Per Tube Q12H   erythromycin   Right Eye Q6H   feeding supplement (PROSource TF)  45 mL Per Tube BID   fentaNYL (SUBLIMAZE) injection  200 mcg Intravenous Once   fentaNYL (SUBLIMAZE) injection  50 mcg Intravenous Once   fiber  1 packet Per Tube BID   free water  200 mL Per Tube Q6H   heparin injection (subcutaneous)  5,000 Units Subcutaneous Q8H   hydrALAZINE  50 mg Per Tube Q8H   insulin aspart  0-6 Units  Subcutaneous Q4H   mouth rinse  15 mL Mouth Rinse 10 times per day   midazolam  5 mg Intravenous Once   pantoprazole sodium  40 mg Per Tube Daily   propofol  50 mg Intravenous Once   sodium bicarbonate  650 mg Per Tube TID   sodium zirconium cyclosilicate  10 g Per Tube Daily   thiamine  100 mg Per Tube Daily   torsemide  40 mg Per Tube Daily   vecuronium  10 mg Intravenous Once    have reviewed scheduled and prn medications.  Physical Exam: General: Ill-looking male on mechanical ventilation, not responding.   Heart:RRR, s1s2 nl Lungs: Coarse breath sound bilateral. Abdomen:soft, Non-tender, non-distended Extremities:No edema Neurology: Not responding and not following command GU: Foley catheter in place  Irja Wheless B Dorthia Tout 01/19/2021,10:33 AM  LOS: 13 days

## 2021-01-20 ENCOUNTER — Inpatient Hospital Stay (HOSPITAL_COMMUNITY): Payer: Medicaid Other | Admitting: Anesthesiology

## 2021-01-20 ENCOUNTER — Inpatient Hospital Stay (HOSPITAL_COMMUNITY): Payer: Medicaid Other

## 2021-01-20 ENCOUNTER — Encounter (HOSPITAL_COMMUNITY): Admission: EM | Disposition: E | Payer: Self-pay | Source: Home / Self Care | Attending: Neurology

## 2021-01-20 DIAGNOSIS — S0501XA Injury of conjunctiva and corneal abrasion without foreign body, right eye, initial encounter: Secondary | ICD-10-CM | POA: Diagnosis not present

## 2021-01-20 DIAGNOSIS — E440 Moderate protein-calorie malnutrition: Secondary | ICD-10-CM

## 2021-01-20 DIAGNOSIS — I161 Hypertensive emergency: Secondary | ICD-10-CM | POA: Diagnosis not present

## 2021-01-20 HISTORY — PX: TRACHEOSTOMY TUBE PLACEMENT: SHX814

## 2021-01-20 LAB — RENAL FUNCTION PANEL
Albumin: 1.9 g/dL — ABNORMAL LOW (ref 3.5–5.0)
Anion gap: 16 — ABNORMAL HIGH (ref 5–15)
BUN: 156 mg/dL — ABNORMAL HIGH (ref 6–20)
CO2: 21 mmol/L — ABNORMAL LOW (ref 22–32)
Calcium: 9.9 mg/dL (ref 8.9–10.3)
Chloride: 103 mmol/L (ref 98–111)
Creatinine, Ser: 6.47 mg/dL — ABNORMAL HIGH (ref 0.61–1.24)
GFR, Estimated: 10 mL/min — ABNORMAL LOW (ref >60)
Glucose, Bld: 94 mg/dL (ref 70–99)
Phosphorus: 8.2 mg/dL — ABNORMAL HIGH (ref 2.5–4.6)
Potassium: 4.9 mmol/L (ref 3.5–5.1)
Sodium: 140 mmol/L (ref 135–145)

## 2021-01-20 LAB — GLUCOSE, CAPILLARY
Glucose-Capillary: 91 mg/dL (ref 70–99)
Glucose-Capillary: 92 mg/dL (ref 70–99)
Glucose-Capillary: 96 mg/dL (ref 70–99)
Glucose-Capillary: 97 mg/dL (ref 70–99)
Glucose-Capillary: 97 mg/dL (ref 70–99)
Glucose-Capillary: 98 mg/dL (ref 70–99)

## 2021-01-20 LAB — SURGICAL PCR SCREEN
MRSA, PCR: NEGATIVE
Staphylococcus aureus: NEGATIVE

## 2021-01-20 LAB — APTT: aPTT: 45 seconds — ABNORMAL HIGH (ref 24–36)

## 2021-01-20 LAB — PROTIME-INR
INR: 1.2 (ref 0.8–1.2)
Prothrombin Time: 15.3 seconds — ABNORMAL HIGH (ref 11.4–15.2)

## 2021-01-20 IMAGING — DX DG CHEST 1V PORT
1 series · 1 of 1 positions shown · non-contrast
Comparison: Chest radiograph [DATE]

CLINICAL DATA: ET tube present, respiratory failure

EXAM:
PORTABLE CHEST 1 VIEW

[chest]
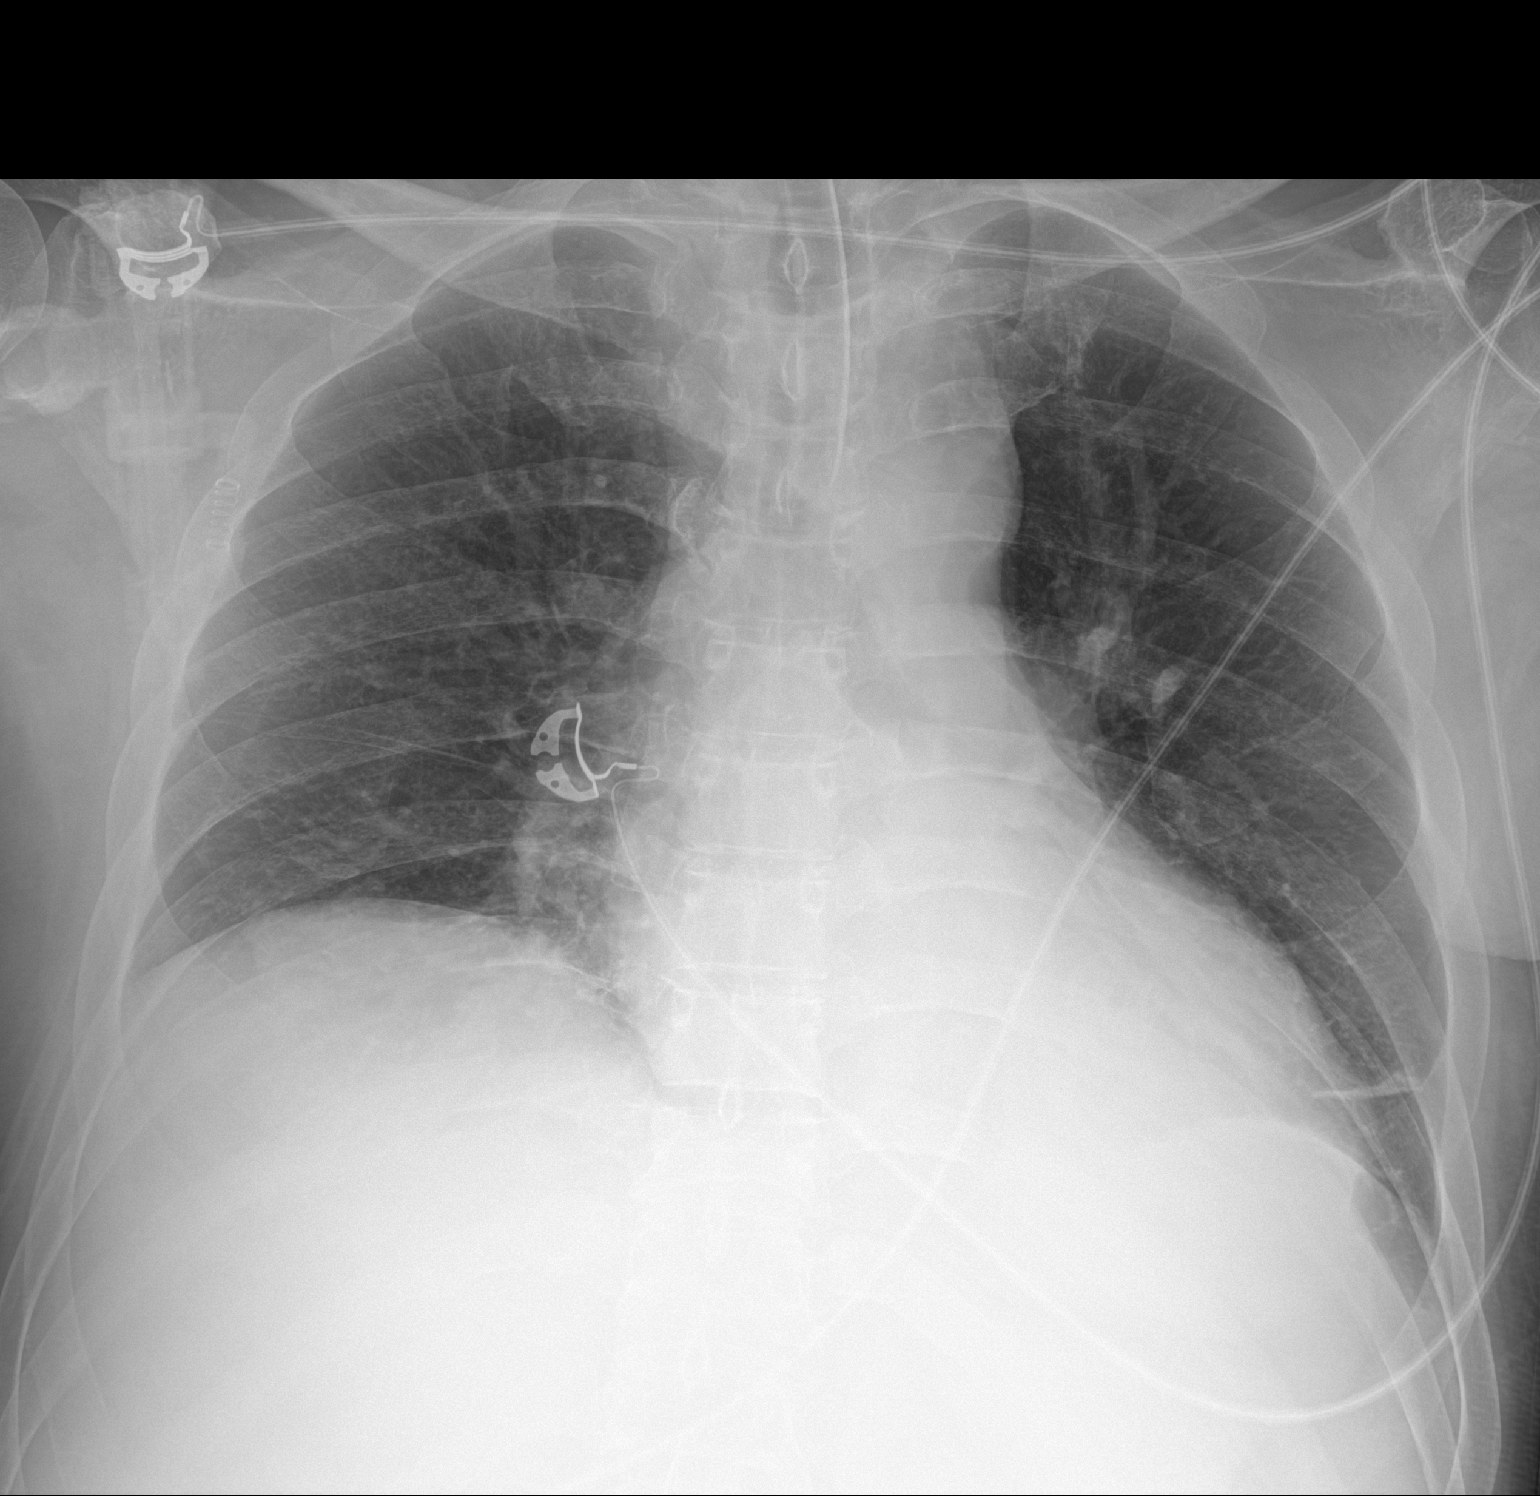

[1 of 1 positions shown; findings below may reference images not displayed]

FINDINGS: The endotracheal tube tip is approximately 2.8 cm from the carina.

The cardiomediastinal silhouette is stable.

Linear opacity projecting over the lateral left base likely reflect
subsegmental atelectasis. There is no other focal consolidation or
pulmonary edema. There is no pleural effusion or pneumothorax.

There is no acute osseous abnormality.
IMPRESSION: 1. Endotracheal tube in satisfactory position.
2. Mild left basilar subsegmental atelectasis. Otherwise, no
radiographic evidence of acute cardiopulmonary process.

## 2021-01-20 IMAGING — DX DG CHEST 1V PORT
1 series · 1 of 1 positions shown · non-contrast
Comparison: Radiograph earlier today.

CLINICAL DATA: Postop trach placement.

EXAM:
PORTABLE CHEST 1 VIEW

[chest ap]
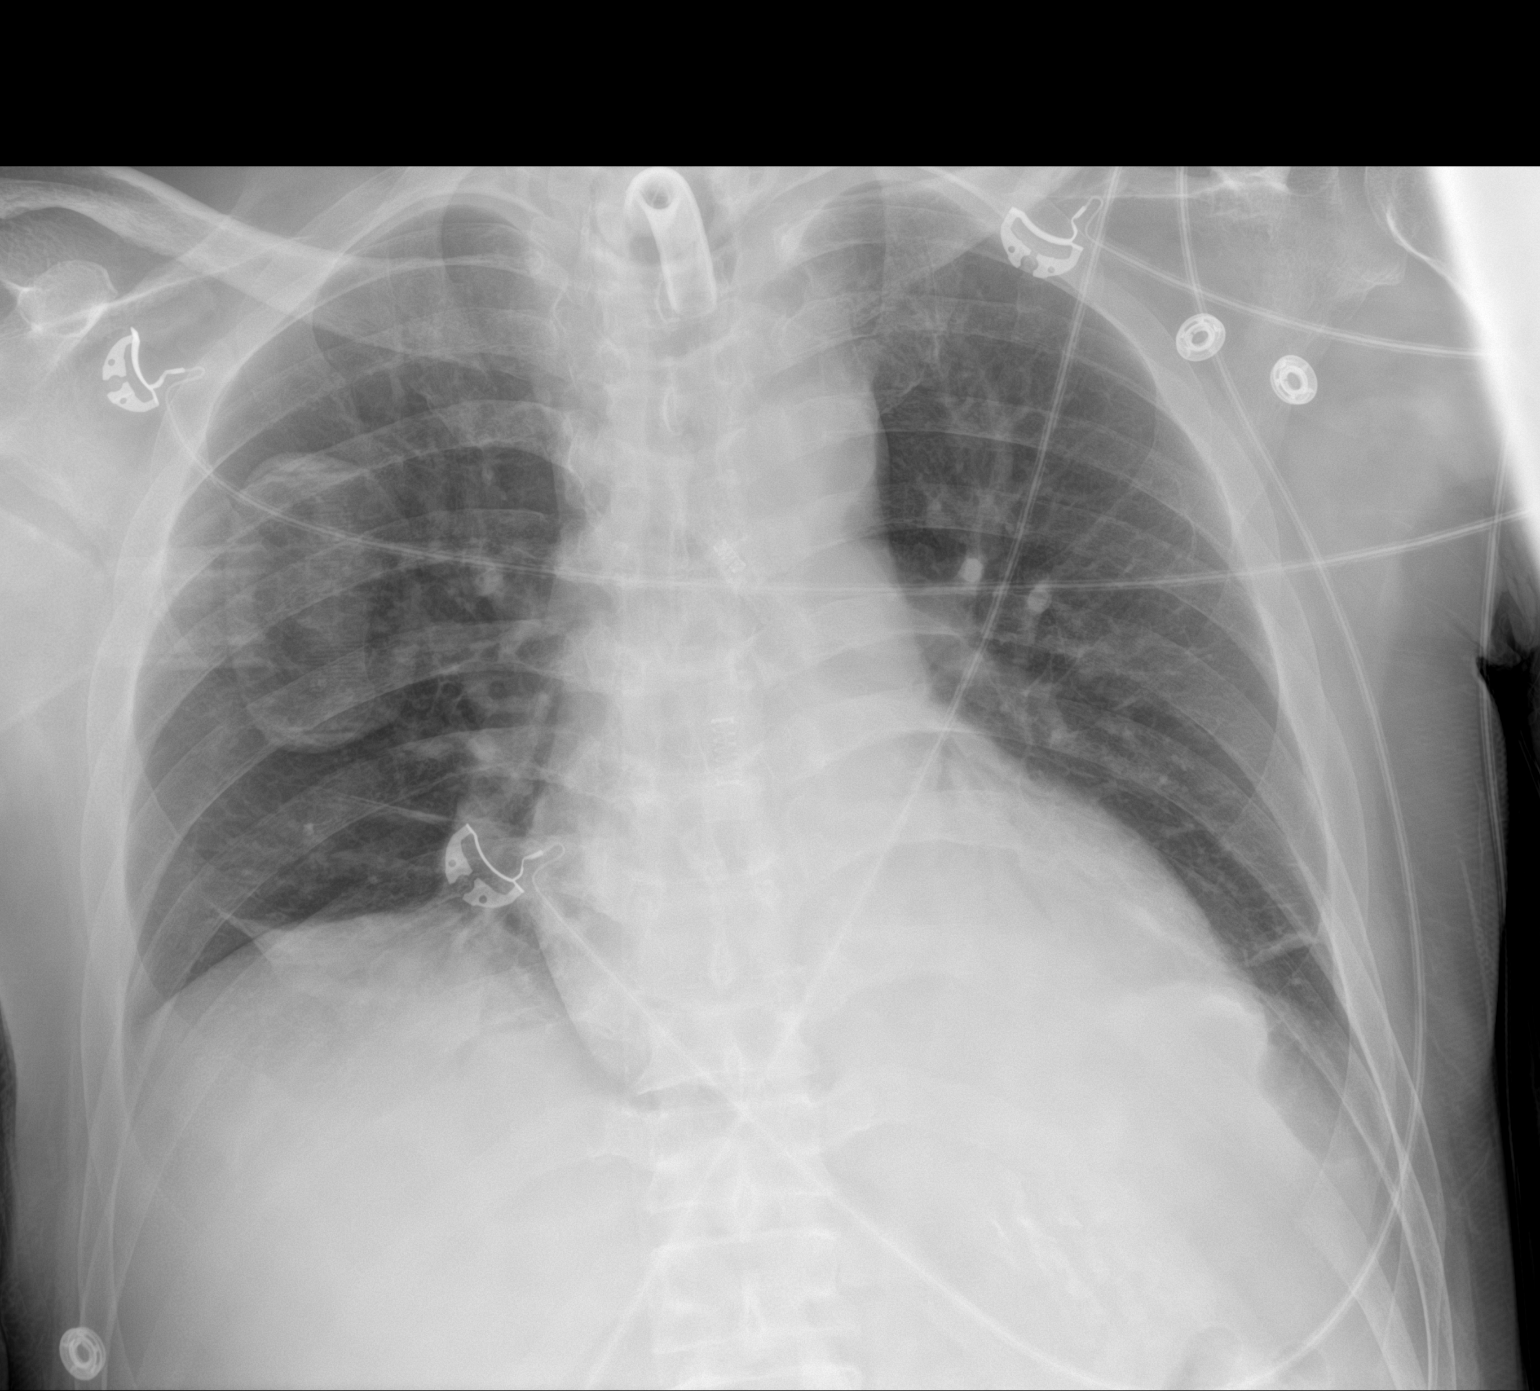

[1 of 1 positions shown; findings below may reference images not displayed]

FINDINGS: Removal of endotracheal tube with no tracheostomy tube in place.
Tracheostomy tube tip projects over the clavicular heads. Persistent
low lung volumes. Bibasilar atelectasis, with confluent retrocardiac
opacity. Stable heart size and mediastinal contours. No
pneumothorax. There may be small pleural effusions.
IMPRESSION: 1. Tracheostomy tube tip projects over the clavicular heads.
2. Persistent low lung volumes with bibasilar atelectasis and
confluent retrocardiac opacity. Possible small pleural effusions.

## 2021-01-20 SURGERY — CREATION, TRACHEOSTOMY
Anesthesia: General | Site: Neck

## 2021-01-20 MED ORDER — PROSOURCE TF PO LIQD
45.0000 mL | Freq: Three times a day (TID) | ORAL | Status: DC
  Administered 2021-01-21 – 2021-01-28 (×23): 45 mL
  Filled 2021-01-20 (×22): qty 45

## 2021-01-20 MED ORDER — MUPIROCIN 2 % EX OINT
1.0000 "application " | TOPICAL_OINTMENT | Freq: Two times a day (BID) | CUTANEOUS | Status: AC
  Administered 2021-01-20 – 2021-01-24 (×9): 1 via NASAL
  Filled 2021-01-20 (×2): qty 22

## 2021-01-20 MED ORDER — NEPRO/CARBSTEADY PO LIQD
1000.0000 mL | ORAL | Status: DC
  Administered 2021-01-21 – 2021-01-27 (×5): 1000 mL
  Filled 2021-01-20 (×9): qty 1000

## 2021-01-20 MED ORDER — FENTANYL CITRATE (PF) 100 MCG/2ML IJ SOLN
INTRAMUSCULAR | Status: DC | PRN
  Administered 2021-01-20: 50 ug via INTRAVENOUS

## 2021-01-20 MED ORDER — LIDOCAINE-EPINEPHRINE 1 %-1:100000 IJ SOLN
INTRAMUSCULAR | Status: AC
  Filled 2021-01-20: qty 1

## 2021-01-20 MED ORDER — ROCURONIUM BROMIDE 10 MG/ML (PF) SYRINGE
PREFILLED_SYRINGE | INTRAVENOUS | Status: DC | PRN
  Administered 2021-01-20: 50 mg via INTRAVENOUS

## 2021-01-20 MED ORDER — FENTANYL CITRATE (PF) 250 MCG/5ML IJ SOLN
INTRAMUSCULAR | Status: AC
  Filled 2021-01-20: qty 5

## 2021-01-20 MED ORDER — 0.9 % SODIUM CHLORIDE (POUR BTL) OPTIME
TOPICAL | Status: DC | PRN
  Administered 2021-01-20: 1000 mL

## 2021-01-20 MED ORDER — MIDAZOLAM HCL 2 MG/2ML IJ SOLN
INTRAMUSCULAR | Status: AC
  Filled 2021-01-20: qty 2

## 2021-01-20 MED ORDER — SUGAMMADEX SODIUM 200 MG/2ML IV SOLN
INTRAVENOUS | Status: DC | PRN
  Administered 2021-01-20: 200 mg via INTRAVENOUS

## 2021-01-20 MED ORDER — PROPOFOL 10 MG/ML IV BOLUS
INTRAVENOUS | Status: AC
  Filled 2021-01-20: qty 20

## 2021-01-20 MED ORDER — LIDOCAINE-EPINEPHRINE 1 %-1:100000 IJ SOLN
INTRAMUSCULAR | Status: DC | PRN
  Administered 2021-01-20: 2 mL

## 2021-01-20 MED ORDER — LACTATED RINGERS IV SOLN: INTRAVENOUS | Status: DC | PRN

## 2021-01-20 MED ORDER — MIDAZOLAM HCL 5 MG/5ML IJ SOLN
INTRAMUSCULAR | Status: DC | PRN
  Administered 2021-01-20: 2 mg via INTRAVENOUS

## 2021-01-20 SURGICAL SUPPLY — 39 items
BAG COUNTER SPONGE SURGICOUNT (BAG) ×2 IMPLANT
BAG SPNG CNTER NS LX DISP (BAG) ×1
BLADE CLIPPER SURG (BLADE) IMPLANT
BLADE SURG 15 STRL LF DISP TIS (BLADE) IMPLANT
BLADE SURG 15 STRL SS (BLADE)
CANISTER SUCT 3000ML PPV (MISCELLANEOUS) ×2 IMPLANT
CLEANER TIP ELECTROSURG 2X2 (MISCELLANEOUS) ×2 IMPLANT
COVER SURGICAL LIGHT HANDLE (MISCELLANEOUS) ×2 IMPLANT
DRAPE HALF SHEET 40X57 (DRAPES) IMPLANT
ELECT COATED BLADE 2.86 ST (ELECTRODE) ×2 IMPLANT
ELECT REM PT RETURN 9FT ADLT (ELECTROSURGICAL) ×2
ELECTRODE REM PT RTRN 9FT ADLT (ELECTROSURGICAL) ×1 IMPLANT
GAUZE 4X4 16PLY ~~LOC~~+RFID DBL (SPONGE) ×2 IMPLANT
GAUZE XEROFORM 5X9 LF (GAUZE/BANDAGES/DRESSINGS) IMPLANT
GLOVE SURG ENC TEXT LTX SZ7 (GLOVE) ×4 IMPLANT
GOWN STRL REUS W/ TWL LRG LVL3 (GOWN DISPOSABLE) ×1 IMPLANT
GOWN STRL REUS W/TWL LRG LVL3 (GOWN DISPOSABLE) ×2
HOLDER TRACH TUBE VELCRO 19.5 (MISCELLANEOUS) IMPLANT
KIT BASIN OR (CUSTOM PROCEDURE TRAY) ×2 IMPLANT
KIT SUCTION CATH 14FR (SUCTIONS) IMPLANT
KIT TURNOVER KIT B (KITS) ×2 IMPLANT
NEEDLE HYPO 25GX1X1/2 BEV (NEEDLE) ×2 IMPLANT
NS IRRIG 1000ML POUR BTL (IV SOLUTION) ×2 IMPLANT
PACK EENT II TURBAN DRAPE (CUSTOM PROCEDURE TRAY) ×2 IMPLANT
PAD ARMBOARD 7.5X6 YLW CONV (MISCELLANEOUS) ×4 IMPLANT
PENCIL SMOKE EVACUATOR (MISCELLANEOUS) ×2 IMPLANT
SPONGE DRAIN TRACH 4X4 STRL 2S (GAUZE/BANDAGES/DRESSINGS) ×2 IMPLANT
SPONGE INTESTINAL PEANUT (DISPOSABLE) ×2 IMPLANT
SUT CHROMIC 2 0 SH (SUTURE) ×2 IMPLANT
SUT ETHILON 2 0 FS 18 (SUTURE) ×4 IMPLANT
SUT SILK 2 0 PERMA HAND 18 BK (SUTURE) ×2 IMPLANT
SUT SILK 3 0 REEL (SUTURE) ×2 IMPLANT
SYR 20ML LL LF (SYRINGE) ×2 IMPLANT
SYR BULB IRRIG 60ML STRL (SYRINGE) IMPLANT
SYR CONTROL 10ML LL (SYRINGE) ×2 IMPLANT
TUBE CONNECTING 12X1/4 (SUCTIONS) ×2 IMPLANT
TUBE TRACH  6.0 CUFF FLEX (MISCELLANEOUS) ×2
TUBE TRACH 6.0 CUFF FLEX (MISCELLANEOUS) ×1 IMPLANT
WATER STERILE IRR 1000ML POUR (IV SOLUTION) ×2 IMPLANT

## 2021-01-20 NOTE — Progress Notes (Signed)
STROKE TEAM PROGRESS NOTE   INTERVAL HISTORY RN and RT are at the bedside for trach. Pt just came back from tracheostomy at OR.  RT suctioning from trach for secretions.  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  Right eye suture.     Vitals:   01/13/2021 0800 01/22/2021 0820 01/09/2021 0900 01/07/2021 1100  BP: 123/84  117/77 98/67  Pulse: 79 77 75 72  Resp: 14  13 14   Temp: 97.7 F (36.5 C)     TempSrc: Axillary     SpO2: 99%  100% 96%  Weight:      Height:       CBC:  Recent Labs  Lab 01/18/21 0431 01/19/21 0346  WBC 11.8* 9.2  NEUTROABS  --  6.0  HGB 8.1* 7.6*  HCT 25.4* 23.9*  MCV 89.1 89.5  PLT 334 096   Basic Metabolic Panel:  Recent Labs  Lab 01/16/2021 0354 01/18/21 0431 01/19/21 0346  NA 142 136 141  K 5.4* 5.0 5.3*  CL 108 103 105  CO2 21* 19* 20*  GLUCOSE 90 117* 113*  BUN 102* 122* 134*  CREATININE 5.60* 5.53* 5.90*  CALCIUM 10.3 9.8 9.8  PHOS 5.3* 7.2*  --      IMAGING past 24 hours CT ABDOMEN PELVIS WO CONTRAST  Result Date: 01/07/2021 CLINICAL DATA:  Inpatient. Acute nonlocalized abdominal pain. Pontine hemorrhage. EXAM: CT ABDOMEN AND PELVIS WITHOUT CONTRAST TECHNIQUE: Multidetector CT imaging of the abdomen and pelvis was performed following the standard protocol without IV contrast. COMPARISON:  01/22/2021 abdominal radiograph. FINDINGS: Lower chest: Mild to moderate dependent bibasilar atelectasis. Coronary atherosclerosis. Hepatobiliary: Normal liver size. No liver mass. Prominently distended (6.4 cm diameter) gallbladder. No gallbladder wall thickening. No pericholecystic fluid. No radiopaque cholelithiasis. No biliary ductal dilatation. CBD diameter 5 mm. Pancreas: No pancreatic duct dilation or discrete pancreatic mass. Mild peripancreatic fat stranding at the pancreatic head. No peripancreatic fluid collections. Spleen: Normal size. No mass. Adrenals/Urinary Tract: No discrete adrenal nodules. No hydronephrosis. No renal  stones. Simple 1.8 cm lower right renal cyst. Scattered subcentimeter hyperdense and hypodense bilateral renal cortical lesions are too small to characterize and require no follow-up. Bladder completely collapsed by indwelling Foley catheter with expected gas in the nondependent bladder lumen. Stomach/Bowel: Enteric tube terminates in duodenal bulb. Stomach is collapsed. Suggestion of generalized gastric wall thickening, poorly assessed on this scan, with associated fat stranding in the gastrohepatic ligament. Normal caliber small bowel with no small bowel wall thickening. Normal appendix. Moderate diffuse colonic diverticulosis, with no large bowel wall thickening or significant pericolonic fat stranding. Vascular/Lymphatic: Atherosclerotic nonaneurysmal abdominal aorta. No pathologically enlarged lymph nodes in the abdomen or pelvis. Reproductive: Normal size prostate. Other: No pneumoperitoneum. Small volume ascites, predominantly perihepatic. No focal fluid collections. Symmetric mild-to-moderate bilateral gynecomastia. Musculoskeletal: No aggressive appearing focal osseous lesions. Mild thoracolumbar spondylosis. Chronic appearing mild T10 vertebral compression fracture. IMPRESSION: 1. Suggestion of generalized gastric wall thickening, poorly assessed on this scan due to gastric decompression by enteric tube, with associated fat stranding in the gastrohepatic ligament. Findings are nonspecific and could be due to noninflammatory edema, gastritis, peptic ulcer disease or neoplasm. Consider upper endoscopic correlation as clinically warranted. 2. Small volume ascites, predominantly perihepatic. 3. Prominently distended gallbladder. No radiopaque cholelithiasis. No gallbladder wall thickening. No pericholecystic fluid. No biliary ductal dilatation. 4. Moderate diffuse colonic diverticulosis. 5. Mild to moderate dependent bibasilar atelectasis. 6. Coronary atherosclerosis. 7. Chronic appearing mild T10 vertebral  compression fracture. 8.  Aortic Atherosclerosis (ICD10-I70.0). Electronically Signed   By: Ilona Sorrel M.D.   On: 01/07/2021 13:14   DG Chest 1 View  Result Date: 01/12/2021 CLINICAL DATA:  Intubation, stroke EXAM: CHEST  1 VIEW COMPARISON:  Portable exam 0942 hours compared to 01/14/2021 FINDINGS: Tip of endotracheal tube projects 3.0 cm above carina. Feeding tube extends into abdomen. Normal heart size mediastinal contours. Persistent RIGHT basilar atelectasis with improved aeration at LEFT base since previous exam. No new infiltrate, pleural effusion, or pneumothorax. Bones demineralized. IMPRESSION: Persistent mild RIGHT basilar atelectasis. Improved aeration at LEFT lung base since previous exam. Electronically Signed   By: Lavonia Dana M.D.   On: 01/12/2021 12:03   CT HEAD WO CONTRAST (5MM)  Result Date: 01/14/2021 CLINICAL DATA:  Stroke, follow up EXAM: CT HEAD WITHOUT CONTRAST TECHNIQUE: Contiguous axial images were obtained from the base of the skull through the vertex without intravenous contrast. COMPARISON:  CT head January 07, 2021. FINDINGS: Brain: When accounting for differences in technique (different plane of imaging on axial imaging), similar size an intraparenchymal hemorrhage within the pons, measuring up to approximately 2.0 by 3.2 by 2.6 cm (similar when remeasured). Surrounding edema in the brainstem appears similar. Intraventricular hemorrhage in the fourth ventricle is decreased. Similar effacement of the fourth ventricle without progressive ventriculomegaly to suggest obstructive hydrocephalus. No evidence of interval acute large vascular territory infarct. Patchy white matter hypoattenuation, compatible with advanced chronic microvascular ischemic disease that appears similar to prior. No midline shift. Similar mild cerebral atrophy. Vascular: No hyperdense vessel identified. Skull: No acute fracture. Sinuses/Orbits: Mucosal thickening of scattered paranasal sinuses, similar. No  acute orbital findings. Other: Small bilateral mastoid effusions. IMPRESSION: 1. When accounting for differences in technique, similar size of a recent intraparenchymal hemorrhage within the pons, as detailed above. Similar surrounding brainstem edema. Intraventricular hemorrhage in the fourth ventricle is decreased. Similar effacement of the fourth ventricle without progressive ventriculomegaly to suggest obstructive hydrocephalus. 2. Similar advanced chronic microvascular ischemic disease. Electronically Signed   By: Margaretha Sheffield M.D.   On: 01/14/2021 16:24   CT HEAD WO CONTRAST (5MM)  Result Date: 01/07/2021 CLINICAL DATA:  Stroke, follow-up 6 hour stability scan. EXAM: CT HEAD WITHOUT CONTRAST TECHNIQUE: Contiguous axial images were obtained from the base of the skull through the vertex without intravenous contrast. COMPARISON:  Brain MRI 01/07/2021. head CT 01/14/2021. FINDINGS: Brain: Mild generalized cerebral atrophy. Stable size of an acute parenchymal hemorrhage within the pons, again measuring 2.8 x 2.1 cm in transaxial dimensions and 2.6 cm in craniocaudal dimension. As before, there is surrounding edema within the brainstem with mass effect and partial fourth ventricular effacement. Unchanged small volume extension of hemorrhage into the fourth ventricle and posterior fossa subarachnoid spaces. No evidence of obstructive hydrocephalus at this time. Stable supratentorial chronic small vessel ischemic disease. No demarcated cortical infarct. Vascular: No hyperdense vessel.  Atherosclerotic calcifications. Skull: Normal. Negative for fracture or focal lesion. Sinuses/Orbits: Visualized orbits show no acute finding. Mild mucosal thickening within the bilateral ethmoid, sphenoid and maxillary sinuses. IMPRESSION: Stable size of an acute parenchymal hemorrhage within the pons. As before, there is surrounding edema within the brainstem with mass effect and partial effacement of the fourth ventricle. No  evidence of obstructive hydrocephalus at this time. Also unchanged, there is small-volume extension of hemorrhage into the fourth ventricle and posterior fossa subarachnoid spaces. Stable background cerebral atrophy and chronic supratentorial chronic small vessel ischemic disease. Electronically Signed   By: Kellie Simmering D.O.   On:  01/07/2021 11:42   CT Head Wo Contrast  Result Date: 01/10/2021 CLINICAL DATA:  Mental status change.  Found unresponsive. EXAM: CT HEAD WITHOUT CONTRAST TECHNIQUE: Contiguous axial images were obtained from the base of the skull through the vertex without intravenous contrast. COMPARISON:  None. FINDINGS: Brain: There is a hyperdense mass within the brainstem measuring 2.8 by 2.1 by 2.6 cm compatible with hemorrhagic infarct. There is surrounding low attenuation edema. Posterior extension of clot is identified into the fourth ventricle and the cisterna magna, image 70/2. decreased CSF space is noted at the cerebellar pontine angle. There is moderate patchy low-attenuation within the subcortical and periventricular white matter compatible with chronic microvascular disease. Prominence of the sulci identified compatible with brain atrophy. Vascular: No hyperdense vessel or unexpected calcification. Skull: Normal. Negative for fracture or focal lesion. Sinuses/Orbits: Mild mucosal thickening is noted involving the maxillary sinuses. Mastoid air cells are clear. Other: None. IMPRESSION: 1. Examination is positive for hemorrhagic infarct within the brainstem. 2. Posterior extension of clot into the fourth ventricle and the cisterna magna. 3. Chronic small vessel ischemic disease and brain atrophy. Critical Value/emergent results were called by telephone at the time of interpretation on 01/02/2021 at 2:41 pm to provider [facility] , who verbally acknowledged these results. Electronically Signed   By: Kerby Moors M.D.   On: 01/30/2021 14:42   MR BRAIN WO CONTRAST  Result Date:  01/07/2021 CLINICAL DATA:  55 year old male with altered mental status, found unresponsive. Brainstem hemorrhage. Ophthalmoplegia. EXAM: MRI HEAD AND ORBITS WITHOUT CONTRAST TECHNIQUE: Multiplanar, multi-echo pulse sequences of the brain and surrounding structures were acquired without intravenous contrast. Multiplanar, multi-echo pulse sequences of the orbits and surrounding structures were acquired including fat saturation techniques, without intravenous contrast administration. COMPARISON:  Plain head CT 01/21/2021. FINDINGS: MRI HEAD FINDINGS Brain: Heterogeneous T2 and largely isointense T1 hemorrhage within the brainstem is expanding the pons and as seen by SWI tracks into the 4th ventricle. Estimated intra-axial hemorrhage size by MRI is 24 x 29 by 28 mm (AP by transverse by CC) for an estimated blood volume of 10 mL. Extensive pontine edema. On the dedicated orbit images edema is seen to track into the right medullary pyramid (series 8, image 7 of that exam). Edema also tracks cephalad into the midbrain greater on the right. Fourth ventricle size remains relatively normal. Punctate blood layering in the left occipital horn but no other lateral or 3rd intraventricular hemorrhage. No ventriculomegaly. No definite transependymal edema. Small volume of subarachnoid hemorrhage in the cisterna magna. SWI also demonstrates chronic microhemorrhages scattered in the cerebellum (especially on the left series 15, image 15) and bilateral deep gray matter nuclei (image 34. Occasional cerebral hemisphere microhemorrhages (image 35 on the left). Basilar cisterns remain patent.  No midline shift. DWI demonstrates no larger area of diffusion restriction. No chronic cortical encephalomalacia but moderate Patchy and confluent bilateral cerebral white matter T2 and FLAIR hyperintensity in a pattern suggestive of chronic small vessel ischemia. Vascular: Major intracranial vascular flow voids are preserved. Skull and upper  cervical spine: Normal bone marrow signal. Negative visible cervical spine. Other: Mastoids are clear. Grossly normal visible internal auditory structures. Intubated. Right nasoenteric tube in place. Fluid in the pharynx. MRI ORBITS FINDINGS Orbits: Normal suprasellar cistern and optic chiasm. Optic nerves appear symmetric, within normal limits. No contrast administered. But there is no intraorbital fat stranding or inflammation identified. No intraorbital mass or mass effect identified. Globes appear normal. Unremarkable noncontrast cavernous sinus. Visualized sinuses: Scattered paranasal  sinus fluid and mucosal thickening in the setting of intubation and right nasoenteric tube. Soft tissues: Negative visible deep soft tissue spaces of the face. IMPRESSION: 1. Acute brainstem hemorrhage centered at the pons with estimated intra-axial blood volume of 10 mL. Extensive pontine edema, which tracks associated edema which tracks into the midbrain right > left and into the right medullary pyramid. 2. Mild extension of hemorrhage into the 4th ventricle and basilar subarachnoid spaces. But no ventriculomegaly. No midline shift or impending herniation. 3. Underlying advanced chronic small vessel disease including multiple chronic microhemorrhages in the cerebellum and bilateral deep gray matter nuclei. Advanced bilateral cerebral white matter ischemia. 4. Negative noncontrast MRI appearance of the orbits. Electronically Signed   By: Genevie Ann M.D.   On: 01/07/2021 05:37   DG CHEST PORT 1 VIEW  Result Date: 01/13/2021 CLINICAL DATA:  Acute respiratory failure with hypoxia EXAM: PORTABLE CHEST 1 VIEW COMPARISON:  01/12/2021. FINDINGS: Endotracheal tube tip 3.8 cm above the carina. Enteric tube courses below the diaphragm in outside the field of view. Similar streaky right basilar opacities, compatible with atelectasis. No new consolidation. No visible pleural effusions or pneumothorax. Similar cardiomediastinal silhouette.  IMPRESSION: Similar right basilar atelectasis.  No new acute abnormality. Electronically Signed   By: Margaretha Sheffield M.D.   On: 01/13/2021 07:21   DG CHEST PORT 1 VIEW  Result Date: 01/14/2021 CLINICAL DATA:  Central line placement EXAM: PORTABLE CHEST 1 VIEW COMPARISON:  01/25/2021 FINDINGS: Endotracheal tube tip is about 4 cm superior to carina. Left IJ central venous catheter tip over the SVC. Esophageal tube tip below the diaphragm. No visible pneumothorax. Interval airspace disease at left base which may be due to atelectasis or aspiration. IMPRESSION: 1. Left IJ central venous catheter tip over the SVC. No pneumothorax 2. Interval airspace disease at left base which may be due to atelectasis or aspiration Electronically Signed   By: Donavan Foil M.D.   On: 01/03/2021 18:33   DG Chest Port 1 View  Result Date: 01/14/2021 CLINICAL DATA:  OG tube placement EXAM: PORTABLE CHEST 1 VIEW COMPARISON:  None. FINDINGS: Endotracheal tube with the tip 5.8 cm above the carina. Nasogastric tube coursing below the diaphragm with the tip excluded from the field of view. No focal consolidation. No pleural effusion or pneumothorax. Heart and mediastinal contours are unremarkable. No acute osseous abnormality. IMPRESSION: Endotracheal tube with the tip 5.8 cm above the carina. Nasogastric tube coursing below the diaphragm with the tip excluded from the field of view. Electronically Signed   By: Kathreen Devoid M.D.   On: 01/29/2021 17:18   DG Chest Port 1 View  Result Date: 01/23/2021 CLINICAL DATA:  A 55 year old male presents with sepsis now intubated. EXAM: PORTABLE CHEST 1 VIEW COMPARISON:  No recent comparisons, most recent prior is from October of 2013. FINDINGS: EKG leads project over the chest. A gastric tube courses through in off the field of the radiograph. Side port is below GE junction, tip is not visualized. Endotracheal tube at 2.5 cm from the carina. Cardiomediastinal contours are normal. RIGHT  hemidiaphragm is elevated. This is slightly accentuated when compared to prior imaging but there is no sign of consolidation, effusion or pneumothorax. On limited assessment there is no acute skeletal process. Incidental note is made of what appears to be in hearing projecting over the RIGHT supraclavicular region and therefore more likely external to the patient. IMPRESSION: Endotracheal tube is at 2.5 cm from the carina. No acute cardiopulmonary process. Gastric tube  courses through off the field of the radiograph. And earring projects over the RIGHT supraclavicular region, may be anterior or posterior to the patient. Electronically Signed   By: Zetta Bills M.D.   On: 01/18/2021 14:07   DG Abd Portable 1V  Result Date: 01/11/2021 CLINICAL DATA:  Nasogastric tube placement EXAM: PORTABLE ABDOMEN - 1 VIEW COMPARISON:  None. FINDINGS: Enteric feeding tube is positioned below the diaphragm with tip in the vicinity of the pylorus. Nonobstructive pattern of included bowel gas. IMPRESSION: Enteric feeding tube is positioned below the diaphragm with tip in the vicinity of the pylorus. Consider advancement if post pyloric positioning is desired. Electronically Signed   By: Delanna Ahmadi M.D.   On: 01/11/2021 13:21   DG Abd Portable 1V  Result Date: 01/09/2021 CLINICAL DATA:  NG tube placement EXAM: PORTABLE ABDOMEN - 1 VIEW COMPARISON:  KUB 01/25/2021 FINDINGS: The enteric catheter tip projects over the expected location of the pylorus/first portion of the duodenum. The side hole projects over the distal stomach. There is a nonobstructive bowel gas pattern to the level imaged. The lung bases are clear. IMPRESSION: Enteric catheter in appropriate position. Electronically Signed   By: Valetta Mole M.D.   On: 01/09/2021 08:49   DG Abd Portable 1V  Result Date: 01/30/2021 CLINICAL DATA:  NG tube EXAM: PORTABLE ABDOMEN - 1 VIEW COMPARISON:  None. FINDINGS: Esophageal tube tip overlies the distal stomach. Upper  gas pattern is unremarkable IMPRESSION: Esophageal tube tip overlies the distal stomach Electronically Signed   By: Donavan Foil M.D.   On: 01/11/2021 18:34   ECHOCARDIOGRAM COMPLETE  Result Date: 01/07/2021    ECHOCARDIOGRAM REPORT   Patient Name:   Timothy Good Date of Exam: 01/07/2021 Medical Rec #:  [id number]        Height:       74.0 in Accession #:    [id number]       Weight:       155.9 lb Date of Birth:  11-19-1965         BSA:          1.955 m Patient Age:    45 years         BP:           114/80 mmHg Patient Gender: M                HR:           92 bpm. Exam Location:  Inpatient Procedure: 2D Echo, Cardiac Doppler, Color Doppler and 3D Echo Indications:    Stroke I63.9  History:        Patient has prior history of Echocardiogram examinations, most                 recent 06/16/2015. CHF; Risk Factors:Hypertension. Chronic kidney                 disease.  Sonographer:    Darlina Sicilian RDCS Referring Phys: [id number] [place]  1. Left ventricular ejection fraction, by estimation, is 55 to 60%. The left ventricle has normal function. The left ventricle demonstrates regional wall motion abnormalities. Basal inferior akinesis.There is moderate asymmetric left ventricular hypertrophy of the basal-septal segment. Left ventricular diastolic parameters are consistent with Grade I diastolic dysfunction (impaired relaxation).  2. Right ventricular systolic function is normal. The right ventricular size is normal. Tricuspid regurgitation signal is inadequate for assessing PA pressure.  3. The mitral valve is normal in structure.  No evidence of mitral valve regurgitation.  4. The aortic valve was not well visualized. Aortic valve regurgitation is mild. No aortic stenosis is present.  5. The inferior vena cava is normal in size with greater than 50% respiratory variability, suggesting right atrial pressure of 3 mmHg. FINDINGS  Left Ventricle: Left ventricular ejection fraction, by estimation, is  55 to 60%. The left ventricle has normal function. The left ventricle demonstrates regional wall motion abnormalities. The left ventricular internal cavity size was normal in size. There is moderate asymmetric left ventricular hypertrophy of the basal-septal segment. Left ventricular diastolic parameters are consistent with Grade I diastolic dysfunction (impaired relaxation). Right Ventricle: The right ventricular size is normal. No increase in right ventricular wall thickness. Right ventricular systolic function is normal. Tricuspid regurgitation signal is inadequate for assessing PA pressure. Left Atrium: Left atrial size was normal in size. Right Atrium: Right atrial size was normal in size. Pericardium: There is no evidence of pericardial effusion. Mitral Valve: The mitral valve is normal in structure. No evidence of mitral valve regurgitation. Tricuspid Valve: The tricuspid valve is normal in structure. Tricuspid valve regurgitation is trivial. Aortic Valve: The aortic valve was not well visualized. Aortic valve regurgitation is mild. No aortic stenosis is present. Pulmonic Valve: The pulmonic valve was not well visualized. Pulmonic valve regurgitation is not visualized. Aorta: The aortic root is normal in size and structure. Venous: The inferior vena cava is normal in size with greater than 50% respiratory variability, suggesting right atrial pressure of 3 mmHg. IAS/Shunts: The interatrial septum was not well visualized.  LEFT VENTRICLE PLAX 2D LVIDd:         5.80 cm   Diastology LVIDs:         3.70 cm   LV e' medial:    6.64 cm/s LV PW:         0.95 cm   LV E/e' medial:  5.8 LV IVS:        0.95 cm   LV e' lateral:   7.62 cm/s LVOT diam:     2.30 cm   LV E/e' lateral: 5.1 LV SV:         66 LV SV Index:   34 LVOT Area:     4.15 cm                           3D Volume EF:                          3D EF:        50 %                          LV EDV:       109 ml                          LV ESV:       54 ml                           LV SV:        55 ml RIGHT VENTRICLE RV S prime:     18.30 cm/s TAPSE (M-mode): 1.7 cm LEFT ATRIUM             Index        RIGHT ATRIUM  Index LA diam:        3.10 cm 1.59 cm/m   RA Area:     7.93 cm LA Vol (A2C):   26.9 ml 13.76 ml/m  RA Volume:   10.80 ml 5.52 ml/m LA Vol (A4C):   25.7 ml 13.15 ml/m LA Biplane Vol: 25.6 ml 13.10 ml/m  AORTIC VALVE LVOT Vmax:   88.80 cm/s LVOT Vmean:  61.000 cm/s LVOT VTI:    0.159 m  AORTA Ao Root diam: 3.70 cm MITRAL VALVE MV Area (PHT): 3.42 cm    SHUNTS MV Decel Time: 222 msec    Systemic VTI:  0.16 m MV E velocity: 38.60 cm/s  Systemic Diam: 2.30 cm MV A velocity: 56.60 cm/s MV E/A ratio:  0.68 Oswaldo Milian MD Electronically signed by Oswaldo Milian MD Signature Date/Time: 01/07/2021/4:27:39 PM    Final    MR ORBITS WO CONTRAST  Result Date: 01/07/2021 CLINICAL DATA:  55 year old male with altered mental status, found unresponsive. Brainstem hemorrhage. Ophthalmoplegia. EXAM: MRI HEAD AND ORBITS WITHOUT CONTRAST TECHNIQUE: Multiplanar, multi-echo pulse sequences of the brain and surrounding structures were acquired without intravenous contrast. Multiplanar, multi-echo pulse sequences of the orbits and surrounding structures were acquired including fat saturation techniques, without intravenous contrast administration. COMPARISON:  Plain head CT 01/16/2021. FINDINGS: MRI HEAD FINDINGS Brain: Heterogeneous T2 and largely isointense T1 hemorrhage within the brainstem is expanding the pons and as seen by SWI tracks into the 4th ventricle. Estimated intra-axial hemorrhage size by MRI is 24 x 29 by 28 mm (AP by transverse by CC) for an estimated blood volume of 10 mL. Extensive pontine edema. On the dedicated orbit images edema is seen to track into the right medullary pyramid (series 8, image 7 of that exam). Edema also tracks cephalad into the midbrain greater on the right. Fourth ventricle size remains relatively normal. Punctate  blood layering in the left occipital horn but no other lateral or 3rd intraventricular hemorrhage. No ventriculomegaly. No definite transependymal edema. Small volume of subarachnoid hemorrhage in the cisterna magna. SWI also demonstrates chronic microhemorrhages scattered in the cerebellum (especially on the left series 15, image 15) and bilateral deep gray matter nuclei (image 34. Occasional cerebral hemisphere microhemorrhages (image 35 on the left). Basilar cisterns remain patent.  No midline shift. DWI demonstrates no larger area of diffusion restriction. No chronic cortical encephalomalacia but moderate Patchy and confluent bilateral cerebral white matter T2 and FLAIR hyperintensity in a pattern suggestive of chronic small vessel ischemia. Vascular: Major intracranial vascular flow voids are preserved. Skull and upper cervical spine: Normal bone marrow signal. Negative visible cervical spine. Other: Mastoids are clear. Grossly normal visible internal auditory structures. Intubated. Right nasoenteric tube in place. Fluid in the pharynx. MRI ORBITS FINDINGS Orbits: Normal suprasellar cistern and optic chiasm. Optic nerves appear symmetric, within normal limits. No contrast administered. But there is no intraorbital fat stranding or inflammation identified. No intraorbital mass or mass effect identified. Globes appear normal. Unremarkable noncontrast cavernous sinus. Visualized sinuses: Scattered paranasal sinus fluid and mucosal thickening in the setting of intubation and right nasoenteric tube. Soft tissues: Negative visible deep soft tissue spaces of the face. IMPRESSION: 1. Acute brainstem hemorrhage centered at the pons with estimated intra-axial blood volume of 10 mL. Extensive pontine edema, which tracks associated edema which tracks into the midbrain right > left and into the right medullary pyramid. 2. Mild extension of hemorrhage into the 4th ventricle and basilar subarachnoid spaces. But no  ventriculomegaly. No midline shift or impending  herniation. 3. Underlying advanced chronic small vessel disease including multiple chronic microhemorrhages in the cerebellum and bilateral deep gray matter nuclei. Advanced bilateral cerebral white matter ischemia. 4. Negative noncontrast MRI appearance of the orbits. Electronically Signed   By: Genevie Ann M.D.   On: 01/07/2021 05:37      PHYSICAL EXAM  Temp:  [97.7 F (36.5 C)-100.7 F (38.2 C)] 97.7 F (36.5 C) (10/21 0800) Pulse Rate:  [72-107] 72 (10/21 1100) Resp:  [12-35] 14 (10/21 1100) BP: (98-123)/(57-84) 98/67 (10/21 1100) SpO2:  [92 %-100 %] 96 % (10/21 1100) FiO2 (%):  [30 %-40 %] 40 % (10/21 0738) Weight:  [79.2 kg] 79.2 kg (10/21 0358)  General - Well nourished, well developed, on trach and vent.   Ophthalmologic - fundi not visualized due to noncooperation.   Cardiovascular - Regular rate and rhythm.   Neuro - just came back from trach, lethargic, however able to open eyes on voice, right eye status post temporary tarsorrhaphy. Able to follow  simple commands on the right hand and foot. With left eye opening, eye in mid position, blinking to visual threat on the left eye, horizontal gaze palsy, but able to have upward and downward gaze. Corneal reflex present on the left, gag and cough present weakly. Breathing over the vent.  Bilateral facial weakness, more on the right.  Tongue protrusion not cooperative. RUE against barely gravity, however follow commands on the right hand. RLE withdraw to pain, follow commands on the right foot, but hemiplegia on the left. Left no babinski. Sensation, coordination not cooperative and gait not tested.     ASSESSMENT/PLAN Timothy Good is a 55 y.o. male with a past medical history significant for uncontrolled hypertension, CKD stage IV, heart failure, hyperlipidemia, daily drinking, medication nonadherence, iron deficiency anemia/anemia of chronic disease, presenting with a pontine  hemorrhage. ICH Score: 3.    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  CT head right pontine hemorrhage with midbrain and medulla extension, IVH at fourth ventricle MRI  Acute brainstem hemorrhage centered at the pons. Extensive pontine edema, which tracks associated edema which tracks into the midbrain right > left and into the right medullary pyramid. Mild extension of hemorrhage into the 4th ventricle and basilar subarachnoid spaces. But no ventriculomegaly. No midline shift or impending herniation. Negative noncontrast MRI appearance of the orbits. CT repeat stable hematoma, no hydrocephalus Consider MRA to rule out AVM or aneurysm once hematoma resolves. 2D Echo EF 55 to 60%.  LDL 75 HgbA1c 5.4 VTE prophylaxis - Heparin subcu Not on Anticoagulant or antiplatelet prior to admission, now on no antithrombotics due to [place] Therapy recommendations:  CIR Disposition: Pending  Cerebral Edema Neurosurgery consulted, no acute intervention recommended at this time. Off 3% saline allow sodium to trend down with daily check   Na 143->141->136  Acute respiratory failure Probable aspiration pneumonia Remains intubated, appreciate CCM management First on Unasyn->Rocephin->off Tmax 100.7->afebrile now Resp culture: few gram positive cocci PEG 10/18 with Trauma Trach 10/21 with ENT    Hypertensive emergency Home meds: none Stable now BP goal less than 160  Currently on Norvasc 10, Coreg 25, hydralazine 100 Q8 Long-term BP goal normotensive  Hyperlipidemia Home meds:  None LDL  75, almost at goal < 70 May consider statin at discharge  AKI on CKD Ischemic ATN Anemia  Creatinine 3.65-3.80-5.2-6.27-5.69-5.46-5.44-5.60-5.53->5.90 Hb 8.2->7.6->7.7->7.3->7.1->6.6->PRBC->8.1->7.6 On tube feeding and free water  Urine adequate No acute need for dialysis Nephrology on  board   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 Eulas Post Cipro eyedrop during the day and erythromycin ointment nightly Continue Vit C 2g and docxy 100 bid Appreciate ophthal Dr. Roda Shutters help  ETOH use disorder with dependence Daily use of excessive liquor per family (?1/2 bottle vodka)  Thiamine, folate and MVI on board Monitor magnesium, 2.2->2.2->2.1 Vitamin B12 -213 Monitor s/s of withdrawal, need for CIWA   Dysphagia Continue tube feeding and free water  Speech on board CT Chest Abd Pelvis: generalized gastric wall thickening, with associated fat stranding in the gastrohepatic ligament.Findings are nonspecific and could be due to noninflammatory edema,gastritis, peptic ulcer disease or neoplasm.  PEG done 10/18  Tobacco abuse Current smoker Smoking cessation counseling will be provided  Other Stroke Risk Factors Current Cigarette smoker, will be advised to stop smoking ETOH use disorder alcohol level <10, advised to drink no more than 2 drink(s) a day  Other Active Problems Multinodular goiter  Hospital day # 14  This patient is critically ill due to pontine ICH, respite failure, IVH, AKI on CKD, right eye corneal abrasion, hypertensive emergency and at significant risk of neurological worsening, death form brain herniation, renal failure, right eye blind, sepsis, hydrocephalus. This patient's care requires constant monitoring of vital signs, hemodynamics, respiratory and cardiac monitoring, review of multiple databases, neurological assessment, discussion with family, other specialists and medical decision making of high complexity. I spent 40 minutes of neurocritical care time in the care of this patient. I had long discussion with daughter [name] at bedside, updated pt current condition, treatment plan and potential prognosis, and answered all the questions. She expressed understanding and  appreciation.     Rosalin Hawking, MD PhD Stroke Neurology 01/26/2021 11:49 AM   To contact Stroke Continuity provider, please refer to [email or web address] After hours, contact General Neurology

## 2021-01-20 NOTE — Anesthesia Postprocedure Evaluation (Signed)
Anesthesia Post Note  Patient: Timothy Good  Procedure(s) Performed: TRACHEOSTOMY (Neck)     Patient location during evaluation: SICU Anesthesia Type: General Level of consciousness: sedated Pain management: pain level controlled Vital Signs Assessment: post-procedure vital signs reviewed and stable Respiratory status: patient remains intubated per anesthesia plan Cardiovascular status: stable Postop Assessment: no apparent nausea or vomiting Anesthetic complications: no   No notable events documented.  Last Vitals:  Vitals:   01/10/2021 1100 01/07/2021 1200  BP: 98/67   Pulse: 72   Resp: 14   Temp:  (!) 36.3 C  SpO2: 96%     Last Pain:  Vitals:   01/28/2021 1200  TempSrc: Axillary  PainSc:                  Tiajuana Amass

## 2021-01-20 NOTE — Progress Notes (Signed)
RT NOTES: Pt back from OR with trach. HOB sheet placed. Omniflex placed on trach.

## 2021-01-20 NOTE — Progress Notes (Addendum)
NAME:  Timothy Good, MRN:  [id number], DOB:  Oct 22, 1965, LOS: 75 ADMISSION DATE:  12/31/2020, CONSULTATION DATE:  01/01/2021 REFERRING MD:  Curly [place], CHIEF COMPLAINT:  found down   History of Present Illness:  55 y/o male with multiple medical problems was found down by in a well call visit by police and was brought to [facility] with a diagnosis of pontine hemorrhage.  He was intubated for airway protection and then transferred to [facility] for further evaluation.  Pulmonary and critical care medicine was consulted for medical management in the setting of acute hemorrhagic stroke.    PMH - CKD -4  Significant Hospital Events: Including procedures, antibiotic start and stop dates in addition to other pertinent events   January 06, 2021 admission, intubation 10/8 MRI orbits appear normal 10/15 head CT no change in pontine hematoma, IVH decreased, no hydrocephalus For trach today  Interim History / Subjective:   For trach by ENT 10/21  Objective   Blood pressure 108/70, pulse 77, temperature 98.7 F (37.1 C), temperature source Oral, resp. rate (!) 24, height 6\' 2"  (1.88 m), weight 79.2 kg, SpO2 99 %.    Vent Mode: PRVC FiO2 (%):  [30 %-40 %] 40 % Set Rate:  [12 bmp] 12 bmp Vt Set:  [490 mL] 490 mL PEEP:  [5 cmH20] 5 cmH20 Pressure Support:  [10 cmH20] 10 cmH20   Intake/Output Summary (Last 24 hours) at 01/16/2021 0854 Last data filed at 01/18/2021 0400 Gross per 24 hour  Intake 1192 ml  Output 725 ml  Net 467 ml   Filed Weights   01/15/21 1845 01/16/21 1800 01/28/2021 0358  Weight: 74.2 kg 74.6 kg 79.2 kg    Examination: General: On vent HEENT: Bilateral proptosis, post tarsorrhaphy on the right Neuro: Left hemiplegia, right side weak  CV: S1-S2 appreciated PULM: Bilateral breath sounds, clear GI: soft, bsx4 active, PEG intact Extremities: warm/dry, no edema  Skin: no rashes or lesions   POC US shows large goiter overlying tracheostomy.    Resolved Hospital Problem list   Induced hypernatremia Acute encephalopathy   Assessment & Plan:   Acute pontine hemorrhage with intraventricular hemorrhage likely in the setting of uncontrolled hypertension  Acute respiratory failure Secondary to pontine hemorrhage -Remains on vent -For tracheostomy today -Post PEG 10/18 with trauma  Probable aspiration pneumonia -Treated with ceftriaxone x5 days  Acute kidney injury on chronic kidney disease stage IV -Electrolytes stable -BUN/creatinine trending higher may need dialysis  Dysphagia -Feeding tube in place  Uncontrolled hypertension  Alcohol dependence -On thiamine, MVI, -Monitor for withdrawal  Multinodular goiter  History of tobacco abuse  Unfortunate location of hemorrhage.  Unlikely to have intact swallowing given pons and medullary involvement.  Plan:  -ENT to trach Friday proceed to trach collar thereafter.  -Given relatively small size of bleed and patient's young age may have reasonable neurological recovery by 3 to 6 months. -Continue daily SBT -Continue enteral blood pressure medication  Best Practice (right click and "Reselect all SmartList Selections" daily)   Diet/type: tubefeeds DVT prophylaxis: SCD GI prophylaxis: H2B Lines: N/A Foley:  Yes, and it is still needed Code Status:  full code Last date of multidisciplinary goals of care discussion : Spoke to patient's daughter, [name], who agrees to tracheostomy and PEG tube as bridge to potential recovery.  She understands that best recovery will take up to 4 months and may not be complete.  CRITICAL CARE Performed by: Amaiah Cristiano A Mavery Milling  The patient is  critically ill with multiple organ systems failure and requires high complexity decision making for assessment and support, frequent evaluation and titration of therapies, application of advanced monitoring technologies and extensive interpretation of multiple databases. Critical Care Time devoted  to patient care services described in this note independent of APP/resident time (if applicable)  is 32 minutes.   Sherrilyn Rist MD [facility] Personal pager: See Amion If unanswered, please page CCM On-call: [phone]  Addendum: Consulted palliative care  discussed with nephrology

## 2021-01-20 NOTE — Anesthesia Preprocedure Evaluation (Signed)
Anesthesia Evaluation  Patient identified by MRN, date of birth, ID band Patient awake    Reviewed: Allergy & Precautions, NPO status , Patient's Chart, lab work & pertinent test results  Airway Mallampati: Intubated       Dental  (+) Dental Advisory Given   Pulmonary  Hypoxic respiratory failure 2/2 ICH and aspiration PNA   breath sounds clear to auscultation       Cardiovascular hypertension, Pt. on medications and Pt. on home beta blockers +CHF   Rhythm:Regular Rate:Normal     Neuro/Psych CVA    GI/Hepatic negative GI ROS, Neg liver ROS,   Endo/Other  negative endocrine ROS  Renal/GU CRF and ARFRenal disease     Musculoskeletal   Abdominal   Peds  Hematology  (+) anemia ,   Anesthesia Other Findings   Reproductive/Obstetrics                             Lab Results  Component Value Date   WBC 9.2 01/19/2021   HGB 7.6 (L) 01/19/2021   HCT 23.9 (L) 01/19/2021   MCV 89.5 01/19/2021   PLT 358 01/19/2021   Lab Results  Component Value Date   CREATININE 5.90 (H) 01/19/2021   BUN 134 (H) 01/19/2021   NA 141 01/19/2021   K 5.3 (H) 01/19/2021   CL 105 01/19/2021   CO2 20 (L) 01/19/2021    Anesthesia Physical Anesthesia Plan  ASA: 4  Anesthesia Plan: General   Post-op Pain Management:    Induction: Inhalational  PONV Risk Score and Plan: 2 and Dexamethasone and Ondansetron  Airway Management Planned: Oral ETT and Tracheostomy  Additional Equipment:   Intra-op Plan:   Post-operative Plan: Post-operative intubation/ventilation  Informed Consent: I have reviewed the patients History and Physical, chart, labs and discussed the procedure including the risks, benefits and alternatives for the proposed anesthesia with the patient or authorized representative who has indicated his/her understanding and acceptance.     Dental advisory given  Plan Discussed with:  CRNA  Anesthesia Plan Comments:         Anesthesia Quick Evaluation

## 2021-01-20 NOTE — Progress Notes (Signed)
   ENT Progress Note: Procedure(s): PERCUTANEOUS ENDOSCOPIC GASTROSTOMY (PEG) PLACEMENT ESOPHAGOGASTRODUODENOSCOPY (EGD) WITH PROPOFOL   Subjective: No change in medical status, patient scheduled to undergo tracheostomy for airway management.  Objective: Vital signs in last 24 hours: Temp:  [97.7 F (36.5 C)-100.7 F (38.2 C)] 97.7 F (36.5 C) (10/21 0800) Pulse Rate:  [76-107] 77 (10/21 0820) Resp:  [12-35] 24 (10/21 0738) BP: (101-121)/(57-79) 108/70 (10/21 0738) SpO2:  [92 %-99 %] 99 % (10/21 0738) FiO2 (%):  [30 %-40 %] 40 % (10/21 0738) Weight:  [79.2 kg] 79.2 kg (10/21 0358) Weight change:  Last BM Date: 01/12/2021  Intake/Output from previous day: 10/20 0701 - 10/21 0700 In: 1192 [NG/GT:922] Out: 725 [Urine:725] Intake/Output this shift: No intake/output data recorded.  Labs: Recent Labs    01/18/21 0431 01/19/21 0346  WBC 11.8* 9.2  HGB 8.1* 7.6*  HCT 25.4* 23.9*  PLT 334 358   Recent Labs    01/18/21 0431 01/19/21 0346  NA 136 141  K 5.0 5.3*  CL 103 105  CO2 19* 20*  GLUCOSE 117* 113*  BUN 122* 134*  CALCIUM 9.8 9.8    Studies/Results: No results found.   PHYSICAL EXAM: Oral tracheal intubation, patient stable, airway stable.   Assessment/Plan: Patient for tracheostomy in [facility] in OR this AM.    Jerrell Belfast 01/19/2021, 9:05 AM

## 2021-01-20 NOTE — Op Note (Signed)
Operative Note: TRACHEOSTOMY  Patient: Timothy Good record number: [id number]  Date:01/08/2021  Pre-operative Indications: 1.  Pontine infarct  Postoperative Indications: Same  Surgical Procedure: Tracheostomy  Anesthesia: GET  Surgeon: Delsa Bern, M.D.  Assist: None  Complications: None  EBL: Less than 50 cc   Brief History: The patient is a 55 y.o. male with a history of pontine infarct with subsequent loss of airway control.  Patient admitted to the neurology/stroke service.  Given the location of his infarct ENT service was consulted for tracheostomy for long-term airway management.  The patient had a relatively large multinodular goiter and percutaneous tracheostomy was not an option. Given the patient's history and findings I recommended tracheostomy under general anesthesia, risks and benefits were discussed in detail with the patient and their family. They understand and agree with our plan for surgery which is scheduled at [facility] on an elective basis.  Surgical Procedure: The patient is brought to the operating room on 01/18/2021 and placed in supine position on the operating table. General endotracheal anesthesia was established without difficulty. When the patient was adequately anesthetized, surgical timeout was performed and correct identification of the patient and the surgical procedure.  The patient was injected with 2 cc of 1% lidocaine 1:100,000 dilution epinephrine, which was injected in the anterior neck skin at the proposed incision.  The patient was positioned and prepped and draped in sterile fashion.  A horizontal skin incision was created in a pre-existing skin crease, this was carried through the skin underlying subcutaneous tissue with a #15 scalpel.  Bovie electrocautery was then used to dissect the subcutaneous tissue and a small amount of subcutaneous fat was removed.  The strap muscles were identified and lateralized.  The  anterior compartment of the neck was visualized in the patient's anterior trachea was palpated.  The patient had a large multinodular goiter overlying his airway.  The thyroid isthmus was divided using Bovie electrocautery and the nodular thyroid lobes were elevated and lateralized.  This allowed direct access to the anterior trachea.  A horizontally oriented tracheotomy incision was created at approximately the second tracheal interspace.  The trachea was suctioned, endotracheal tube withdrawn under direct visualization.  A #6 Shiley tracheostomy tube was inserted without difficulty and the patient's airway was stabilized.  The patient had good gas exchange, no active bleeding.  The tracheostomy tube was sutured into position with 3-0 Ethilon sutures, a Velcro trach tie was then placed.   Patient was awakened from anesthetic and transferred from the operating room to neuro ICU in stable condition. There were no complications and blood loss was minimal.   Delsa Bern, M.D. [facility] ENT 01/22/2021

## 2021-01-20 NOTE — Progress Notes (Signed)
Timothy Good  Assessment/ Plan:  # AKI on CKD3: Underlying chronic kidney disease from hypertension (followed by Dr. Theador Hawthorne in [place]) and his acute kidney injury appears to be from ischemic ATN.    No acute need for dialysis but seems increasingly likley.  He is responsive to diuretics as of now.    Azotemia is 2/2 low gfr, and high protein intake, it is not an absolute indication for HD at this point.  We will continue daily lab, strict ins and outs and watch for renal recovery.  Trach + HD is difficult,  I would suggest we involve palliative care at this time as well.    # Hypertension: Blood pressures currently well controlled on a combination of amlodipine, carvedilol and hydralazine.  Continue to hold ACE inhibitor and monitor with diuretic trial.  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  # Acute hypoxic respiratory failure/VDRF: managed by pulmonary team.  #Hyperkalemia, mild: daily Lokelma  Subjective:  No AML resulted yet BPs stable For Trach later today  Objective Vital signs in last 24 hours: Vitals:   01/09/2021 0738 01/19/2021 0800 01/25/2021 0820 01/30/2021 0900  BP: 108/70 123/84  117/77  Pulse: 80 79 77 75  Resp: (!) _0 Temp:  97.7 F (36.5 C)    TempSrc:  Axillary    SpO2: 99% 99%  100%  Weight:      Height:       Weight change:   Intake/Output Summary (Last 24 hours) at 01/14/2021 1050 Last data filed at 01/16/2021 1035 Gross per 24 hour  Intake 1392 ml  Output 1075 ml  Net 317 ml        Labs: Basic Metabolic Panel: Recent Labs  Lab 01/16/21 0500 01/30/2021 0354 01/18/21 0431 01/19/21 0346  NA 141 142 136 141  K 4.9 5.4* 5.0 5.3*  CL 109 108 103 105  CO2 20* 21* 19* 20*  GLUCOSE 127* 90 117* 113*  BUN 92* 102* 122* 134*  CREATININE 5.44* 5.60* 5.53* 5.90*  CALCIUM 10.4* 10.3  9.8 9.8  PHOS 4.6 5.3* 7.2*  --     Liver Function Tests: Recent Labs  Lab 01/16/21 0500 01/16/2021 0354 01/18/21 0431  ALBUMIN 1.8* 1.7* 1.9*    No results for input(s): LIPASE, AMYLASE in the last 168 hours.  No results for input(s): AMMONIA in the last 168 hours. CBC: Recent Labs  Lab 01/15/21 0256 01/16/21 0500 01/22/2021 0354 01/13/2021 1559 01/18/21 0431 01/19/21 0346  WBC 8.9 12.6* 14.2*  --  11.8* 9.2  NEUTROABS  --   --   --   --   --  6.0  HGB 7.3* 7.1* 6.6* 8.0* 8.1* 7.6*  HCT 23.4* 22.6* 21.7* 25.8* 25.4* 23.9*  MCV 94.0 92.6 95.2  --  89.1 89.5  PLT 215 240 271  --  334 358    Cardiac Enzymes: No results for input(s): CKTOTAL, CKMB, CKMBINDEX, TROPONINI in the last 168 hours.  CBG: Recent Labs  Lab 01/19/21 1542 01/19/21 1937 01/19/21 2323 01/07/2021 0327 01/22/2021 0840  GLUCAP 112* 101* 108* 91 96     Iron Studies: No results for input(s): IRON, TIBC, TRANSFERRIN, FERRITIN in the last 72 hours. Studies/Results: No results found.  Medications: Infusions:  sodium chloride Stopped (01/14/21 1454)   feeding supplement (GLUCERNA 1.5 CAL) Stopped (01/19/21 1238)    Scheduled Medications:  amLODipine  10 mg Per Tube Daily   vitamin C  2,000 mg Per Tube Daily   carvedilol  25 mg Per Tube BID WC   chlorhexidine gluconate (MEDLINE KIT)  15 mL Mouth Rinse BID   Chlorhexidine Gluconate Cloth  6 each Topical Daily   ciprofloxacin  2 drop Right Eye Q4H while awake   doxycycline  100 mg Per Tube Q12H   erythromycin   Right Eye Q6H   feeding supplement (PROSource TF)  45 mL Per Tube BID   fentaNYL (SUBLIMAZE) injection  200 mcg Intravenous Once   fentaNYL (SUBLIMAZE) injection  50 mcg Intravenous Once   fiber  1 packet Per Tube BID   free water  200 mL Per Tube Q6H   heparin injection (subcutaneous)  5,000 Units Subcutaneous Q8H   hydrALAZINE  50 mg Per Tube Q8H   insulin aspart  0-6 Units Subcutaneous Q4H   mouth rinse  15 mL Mouth Rinse 10 times per  day   midazolam  5 mg Intravenous Once   mupirocin ointment  1 application Nasal BID   pantoprazole sodium  40 mg Per Tube Daily   propofol  50 mg Intravenous Once   sodium bicarbonate  650 mg Per Tube TID   sodium zirconium cyclosilicate  10 g Per Tube Daily   thiamine  100 mg Per Tube Daily   torsemide  40 mg Per Tube Daily   vecuronium  10 mg Intravenous Once    have reviewed scheduled and prn medications.  Physical Exam: General: Ill-looking male on mechanical ventilation, intermittently responding.   Heart:RRR, s1s2 nl Lungs: Coarse breath sound bilateral. Abdomen:soft, Non-tender, non-distended Extremities:No edema Neurology: Not responding and not following command   Timothy Good B Timothy Good 12/31/2020,10:50 AM  LOS: 14 days

## 2021-01-20 NOTE — Progress Notes (Signed)
Nutrition Follow-up  DOCUMENTATION CODES:   Non-severe (moderate) malnutrition in context of chronic illness  INTERVENTION:   Tube feeding via PEG:  Nepro @ 45 ml/hr (1080 ml/day) 45 ml ProSource TF TID  Provides: 2064 kcal, 120 grams protein, and 785 ml free water  D/C Glucerna and Nutrisource   Glucerna 1.5 @ 60 ml/hr  Provides: 2160 kcal, 118 grams protein, and 1094 ml free water.   1 Nutrisource fiber BID via tube   200 ml free water every 6 hours  Total free water: 1585 ml   NUTRITION DIAGNOSIS:   Moderate Malnutrition related to chronic illness as evidenced by moderate fat depletion, moderate muscle depletion, severe muscle depletion. Ongoing.   GOAL:   Patient will meet greater than or equal to 90% of their needs Met with TF.   MONITOR:   TF tolerance  REASON FOR ASSESSMENT:   Consult Enteral/tube feeding initiation and management  ASSESSMENT:   Pt with PMH of uncontrolled HTN, CKD stage IV, CHF, HLD, daily ETOH, medication non-adherence, iron deficiency anemia admitted after being found down at home by poilce during a well check. Admitted with a dx of pontine hemorrhage.  Pt discussed during ICU rounds and with RN.  Weight  has been 165 - 175 lb  Jevity 1.5 is now on back order, will adjust TF.  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   10/12 cortrak placed; tip gastric 10/18: PEG placement 10/19 s/p temporary tarsorrhaphy 10/21: s/p trach placement    Patient is currently intubated on ventilator support MV: 6.6 L/min Temp (24hrs), Avg:98.9 F (37.2 C), Min:97.4 F (36.3 C), Max:100.7 F (38.2 C)   Medications reviewed and include: Vitamin C 2000 mg daily (due to lagophthalmos), SSI, [place] 650 mg TID, Lokelma 10 g daily, thiamine 100 mg daily  Labs reviewed: K: 5.3, BUN: 134, Cr: 5.9 CBG's:  92-96    Diet Order:   Diet Order             Diet NPO time specified  Diet effective midnight                   EDUCATION NEEDS:   Not appropriate for education at this time  Skin:  Skin Assessment: Reviewed RN Assessment  Last BM:  10/21 large, type 7  Height:   Ht Readings from Last 1 Encounters:  01/10/21 $RemoveB'6\' 2"'MOwzesOd$  (1.88 m)    Weight:   Wt Readings from Last 1 Encounters:  01/11/2021 79.2 kg    BMI:  Body mass index is 22.42 kg/m.  Estimated Nutritional Needs:   Kcal:  2000-2200  Protein:  105-120 grams  Fluid:  > 2 L/day  Lockie Pares., RD, LDN, CNSC See AMiON for contact information

## 2021-01-20 NOTE — Plan of Care (Signed)
  Problem: Education: Goal: Knowledge of disease or condition will improve Outcome: Not Progressing Goal: Knowledge of secondary prevention will improve Outcome: Not Progressing Goal: Knowledge of patient specific risk factors addressed and post discharge goals established will improve Outcome: Not Progressing   Problem: Coping: Goal: Will verbalize positive feelings about self Outcome: Not Progressing Goal: Will identify appropriate support needs Outcome: Not Progressing   Problem: Education: Goal: Knowledge of General Education information will improve Description: Including pain rating scale, medication(s)/side effects and non-pharmacologic comfort measures Outcome: Not Progressing   Problem: Health Behavior/Discharge Planning: Goal: Ability to manage health-related needs will improve Outcome: Not Progressing   Problem: Nutrition: Goal: Risk of aspiration will decrease Outcome: Progressing   Problem: Clinical Measurements: Goal: Ability to maintain clinical measurements within normal limits will improve Outcome: Progressing Goal: Will remain free from infection Outcome: Progressing   Problem: Nutrition: Goal: Adequate nutrition will be maintained Outcome: Progressing

## 2021-01-20 NOTE — Transfer of Care (Signed)
Immediate Anesthesia Transfer of Care Note  Patient: Timothy Good  Procedure(s) Performed: TRACHEOSTOMY (Neck)  Patient Location: ICU  Anesthesia Type:General  Level of Consciousness: Patient remains intubated per anesthesia plan  Airway & Oxygen Therapy: Patient placed on Ventilator (see vital sign flow sheet for setting)  Post-op Assessment: Report given to RN and Post -op Vital signs reviewed and stable  Post vital signs: Reviewed and stable  Last Vitals:  Vitals Value Taken Time  BP    Temp    Pulse 72 01/19/2021 1059  Resp 14 01/03/2021 1059  SpO2 96 % 01/21/2021 1059  Vitals shown include unvalidated device data.  Last Pain:  Vitals:   01/05/2021 0800  TempSrc: Axillary  PainSc:          Complications: No notable events documented.

## 2021-01-20 NOTE — Progress Notes (Signed)
PT Cancellation Note  Patient Details Name: [name] MRN: [id number] DOB: [birth year]   Cancelled Treatment:    Reason Eval/Treat Not Completed: Patient at procedure or test/unavailable Plan for OR this AM for trach placement. PT will re-attempt at later date.  Massimiliano Rohleder A. Gilford Rile PT, DPT Acute Rehabilitation Services Pager [phone] Office [phone]    Linna Hoff 01/05/2021, 8:57 AM

## 2021-01-21 DIAGNOSIS — I161 Hypertensive emergency: Secondary | ICD-10-CM | POA: Diagnosis not present

## 2021-01-21 DIAGNOSIS — Z7189 Other specified counseling: Secondary | ICD-10-CM | POA: Diagnosis not present

## 2021-01-21 DIAGNOSIS — Z4659 Encounter for fitting and adjustment of other gastrointestinal appliance and device: Secondary | ICD-10-CM

## 2021-01-21 DIAGNOSIS — J9601 Acute respiratory failure with hypoxia: Secondary | ICD-10-CM | POA: Diagnosis not present

## 2021-01-21 DIAGNOSIS — Z452 Encounter for adjustment and management of vascular access device: Secondary | ICD-10-CM

## 2021-01-21 DIAGNOSIS — Z930 Tracheostomy status: Secondary | ICD-10-CM

## 2021-01-21 DIAGNOSIS — R7989 Other specified abnormal findings of blood chemistry: Secondary | ICD-10-CM

## 2021-01-21 DIAGNOSIS — Z515 Encounter for palliative care: Secondary | ICD-10-CM | POA: Diagnosis not present

## 2021-01-21 LAB — CBC
HCT: 23.9 % — ABNORMAL LOW (ref 39.0–52.0)
Hemoglobin: 7.6 g/dL — ABNORMAL LOW (ref 13.0–17.0)
MCH: 28.4 pg (ref 26.0–34.0)
MCHC: 31.8 g/dL (ref 30.0–36.0)
MCV: 89.2 fL (ref 80.0–100.0)
Platelets: 397 10*3/uL (ref 150–400)
RBC: 2.68 MIL/uL — ABNORMAL LOW (ref 4.22–5.81)
RDW: 17.5 % — ABNORMAL HIGH (ref 11.5–15.5)
WBC: 12.1 10*3/uL — ABNORMAL HIGH (ref 4.0–10.5)
nRBC: 0 % (ref 0.0–0.2)

## 2021-01-21 LAB — GLUCOSE, CAPILLARY
Glucose-Capillary: 102 mg/dL — ABNORMAL HIGH (ref 70–99)
Glucose-Capillary: 104 mg/dL — ABNORMAL HIGH (ref 70–99)
Glucose-Capillary: 106 mg/dL — ABNORMAL HIGH (ref 70–99)
Glucose-Capillary: 111 mg/dL — ABNORMAL HIGH (ref 70–99)
Glucose-Capillary: 120 mg/dL — ABNORMAL HIGH (ref 70–99)
Glucose-Capillary: 95 mg/dL (ref 70–99)

## 2021-01-21 LAB — BASIC METABOLIC PANEL
Anion gap: 16 — ABNORMAL HIGH (ref 5–15)
BUN: 166 mg/dL — ABNORMAL HIGH (ref 6–20)
CO2: 22 mmol/L (ref 22–32)
Calcium: 10.1 mg/dL (ref 8.9–10.3)
Chloride: 101 mmol/L (ref 98–111)
Creatinine, Ser: 6.3 mg/dL — ABNORMAL HIGH (ref 0.61–1.24)
GFR, Estimated: 10 mL/min — ABNORMAL LOW (ref >60)
Glucose, Bld: 107 mg/dL — ABNORMAL HIGH (ref 70–99)
Potassium: 5.3 mmol/L — ABNORMAL HIGH (ref 3.5–5.1)
Sodium: 139 mmol/L (ref 135–145)

## 2021-01-21 NOTE — Consult Note (Addendum)
Palliative Care Consult Note                                  Date: 01/21/2021   Patient Name: Timothy Good  DOB: [birth year]  MRN: [id number]  Age / Sex: 55 y.o., male  PCP: Raiford Simmonds., PA-C Referring Physician: Stroke, Md, MD  Reason for Consultation: Establishing goals of care  HPI/Patient Profile: 55 y.o. male  with past medical history of combined systolic/diastolic CHF, CKD stage IV, hypertension, IDA, proteinuria, medical non-compliance admitted on 01/21/2021 with pontine hemorrhage. He was found down in a police well-call visit and brought to [facility] where he was intubated for airway protection and transferred to [facility]. Admitted to neuro ICU for further management.  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 Overall he is weaning well, left hemiplegia per nursing. Due to location of hemorrhage concern for unlikely intact swallowing. However, given his age, he MAY have some neurological recovery in 6-8 months but unsure of how much.  PMT was consulted for [place].    Past Medical History:  Diagnosis Date  . Chronic combined systolic and diastolic CHF (congestive heart failure) ([place])    Echo 06/2010 EF 20-25%, akinesis of the inferior myocardium, severe hypokinesis of the anteroseptal myocardial and grade 3 diastolic dysfunction; Echo 01/2012 mod dilated LV, mod LVH, EF 74-16%, grade 2 diastolic dysfunction, mod LAE, mild RAE   . CKD (chronic kidney disease)    stage IV   . H/O cardiovascular stress test    Lexiscan Myoview 06/2010 was negative for inducible ischemia with an EF of 14%.  . H/O medication noncompliance    due to inability to afford medications  . Hypertension    Uncontrolled  . Iron deficiency anemia   . Protein in urine    3.9 gm Mar 2012; 1.6gm 01/2012    Subjective:    This NP Walden Field reviewed medical records, received report from team, assessed the patient and then meet at the patient's bedside to discuss diagnosis, prognosis, GOC, EOL wishes disposition and options.  I met with the patient at the bedside who is not able to substantially interact. I attempted to call the patient's daughter [name] but there was no answer and I left a message for call back.  I also attempted to call the patient's other daughter on record [name], but the "call cannot be completed at this time." We will attempt to make contact with the family again later.   Concept of Palliative Care was introduced as specialized medical care for people and their families living with serious illness.  If focuses on providing relief from the symptoms and stress of a serious illness.  The goal is to improve quality of life for both the patient and the family. Values and goals of care important to patient and family were attempted to be elicited.  Created space and opportunity for patient  and family to explore thoughts and feelings regarding current medical situation   Natural trajectory and current clinical status were discussed. Questions and concerns addressed. Patient  encouraged to call with questions or concerns.    ADDENDUM: I was able to speak with the patient's daughter [name] and son [name] by phone later in the day.  We had an extensive discussion about the current clinical issues, previously  described prognoses, CODE STATUS, aggressive care, possible need for dialysis and the complications that would entail.  **After discussing the seriousness of this clinical situation they have elected to change his CODE STATUS to DNR.  They would like a family meeting to discuss further treatment options in order to plot the best course forward.  They state "we are okay with what ever needs to be done, however we do not want him to suffer" and they seem to need to decide where that line  of suffering versus minimal benefit is.  Patient/Family Understanding of Illness: They understand the patient has had a stroke and he was on a ventilator.  They know he had a trach tube placed and a PEG tube as well.  They thought that he was getting better but then last night they got a call saying his kidneys were not doing well.  They feel like there is been lots of ups and downs.  They seem to be kind of lost as far as how to move forward.  Today's Discussion: I discussed the patient's case with both the bedside nurse and the pulmonary critical care doctor.  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  However, given the size of the stroke and the patient's age there is some possibility for neurological recovery, although unsure how much.  If so, this is likely take a long time, best estimates anywhere from 6 to 8 months.  The patient has respiratory failure and is on the vent but is apparently weaning well on his trach and there is hope to possibly at some point extubate to trach collar.  He also has a PEG to allow for feedings.  There is concern that he will not have good swallowing function because of his stroke moving forward.  The biggest concern right now is acute kidney injury being followed by nephrology.  There is a good chance that the patient may require hemodialysis.  However, this complicate his care especially after discharge.  Hemodialysis along with trach and PEG would make it difficult to find a discharge facility.  Any facility that is found would likely be states away, making it difficult for family.  We will continue to try to engage the family in discussions to elicit goals of care to help guide the care that we provide.  Review of Systems  Unable to perform ROS: Acuity of condition   Objective:   Primary Diagnoses: Present on Admission: . Pontine hemorrhage ([place])   Physical Exam Vitals and nursing note reviewed.   Constitutional:      General: He is not in acute distress.    Appearance: He is ill-appearing.  HENT:     Head: Normocephalic and atraumatic.  Cardiovascular:     Rate and Rhythm: Normal rate and regular rhythm.     Heart sounds: No murmur heard. Pulmonary:     Effort: Pulmonary effort is normal. No respiratory distress.     Breath sounds: Normal breath sounds. No wheezing or rhonchi.  Abdominal:     General: Abdomen is flat.     Palpations: Abdomen is soft.  Skin:    General: Skin is warm and dry.  Neurological:     Mental Status: He is lethargic.     Comments: Follows simple 1-step commands, not answering questions/nodding otherwise, doubt comprehension of situation.    Vital Signs:  BP 114/77 (BP Location: Left Arm)   Pulse 77   Temp (!) 97.4 F (36.3 C) (  Axillary)   Resp 17   Ht _0  (1.88 m)   Wt 73.8 kg   SpO2 98%   BMI 20.89 kg/m   Palliative Assessment/Data: 10-30% (NPO on tube feeds via PEG tube)    Advanced Care Planning:   Primary Decision Maker: NEXT OF KIN  Code Status/Advance Care Planning: DNR  Decisions/Changes to ACP: Changed to DNR based on discussion with daughter [name] and son Keyshaun  Assessment & Plan:   Impression: 55 year old male with a hemorrhagic stroke with some hope for possible neurological recovery, although unsure how much and how long (best estimate 6 to 8 months).  Respiratory failure on the vent attempting to wean to trach collar.  He is likely in for a very long road to recovery.  There is a good chance that he will need 24-hour care even with neurological recovery.  Currently hemiplegic.  Possible need for hemodialysis, although this would need to be discussed with the family first as it would likely complicate his care as described above.  It was especially complicated post discharge placement.  SUMMARY OF RECOMMENDATIONS   Changed code status to DNR Continue to treat the treatable Remain full scope of care at this  time pending family meeting [place] nephrology input, will need to see if they are offering hemodialysis at this point to help guide discussions Plan family meeting tomorrow or Monday (if possible) with PCCM, neurology, nephrology to lay out prognosis and help family make decisions on care PMT will continue to follow and support patient and family  Symptom Management:  Per primary and PCCM teams PMT is available to assist as needed  Prognosis:  Unable to determine  Discharge Planning:  To Be Determined   Discussed with: Medical team, nursing team    Thank you for allowing [place] to participate in the care of JAIMIE REDDITT PMT will continue to support holistically.  Time Total: 70 min + 45 min = 115 min  Greater than 50%  of this time was spent counseling and coordinating care related to the above assessment and plan.  Signed by: Walden Field, NP Palliative Medicine Team  Team Phone # [phone] (Nights/Weekends)  01/21/2021, 10:27 AM

## 2021-01-21 NOTE — Progress Notes (Addendum)
STROKE TEAM PROGRESS NOTE   INTERVAL HISTORY He is lying in bed in NAD on CPAP via trach. He is awake and alert, following commands. Bleeding around the trach overnight and ENT packed trach site. No active bleeding currently. No family at bedside. Attempt to wean off vent to trach collar today.   Vitals:   01/21/21 1000 01/21/21 1100 01/21/21 1130 01/21/21 1200  BP: 113/72 105/68  105/64  Pulse: 78 80 82 80  Resp: 20 20 (!) 21 20  Temp:    98 F (36.7 C)  TempSrc:    Axillary  SpO2: 98% 99% 98% 98%  Weight:      Height:       CBC:  Recent Labs  Lab 01/19/21 0346 01/21/21 0456  WBC 9.2 12.1*  NEUTROABS 6.0  --   HGB 7.6* 7.6*  HCT 23.9* 23.9*  MCV 89.5 89.2  PLT 358 284   Basic Metabolic Panel:  Recent Labs  Lab 01/18/21 0431 01/19/21 0346 01/02/2021 0332 01/21/21 0356  NA 136   < > 140 139  K 5.0   < > 4.9 5.3*  CL 103   < > 103 101  CO2 19*   < > 21* 22  GLUCOSE 117*   < > 94 107*  BUN 122*   < > 156* 166*  CREATININE 5.53*   < > 6.47* 6.30*  CALCIUM 9.8   < > 9.9 10.1  PHOS 7.2*  --  8.2*  --    < > = values in this interval not displayed.     IMAGING past 24 hours DG CHEST PORT 1 VIEW  Result Date: 01/26/2021 CLINICAL DATA:  Postop trach placement. EXAM: PORTABLE CHEST 1 VIEW COMPARISON:  Radiograph earlier today. FINDINGS: Removal of endotracheal tube with no tracheostomy tube in place. Tracheostomy tube tip projects over the clavicular heads. Persistent low lung volumes. Bibasilar atelectasis, with confluent retrocardiac opacity. Stable heart size and mediastinal contours. No pneumothorax. There may be small pleural effusions. IMPRESSION: 1. Tracheostomy tube tip projects over the clavicular heads. 2. Persistent low lung volumes with bibasilar atelectasis and confluent retrocardiac opacity. Possible small pleural effusions. Electronically Signed   By: Keith Rake M.D.   On: 01/18/2021 19:59    PHYSICAL EXAM  Temp:  [97.4 F (36.3 C)-98 F (36.7 C)]  98 F (36.7 C) (10/22 1200) Pulse Rate:  [75-84] 80 (10/22 1200) Resp:  [13-24] 20 (10/22 1200) BP: (96-120)/(58-99) 105/64 (10/22 1200) SpO2:  [88 %-99 %] 98 % (10/22 1200) FiO2 (%):  [40 %] 40 % (10/22 1130) Weight:  [73.8 kg] 73.8 kg (10/22 0500)  General - Well nourished, well developed middle-aged African-American male status post tracheostomy on ventilator, in no apparent distress.  Ophthalmologic - fundi not visualized due to noncooperation.  Cardiovascular - Regular rhythm and rate.  Mental Status -  Patient is status post tracheostomy.  Neuro Awake and alert. Follows commands. Right eye sutured shut.  Left eye saccadic dysmetria and nystagmus on left gaze.  Cranial Nerves II - XII - II - Visual field intact OU. III, IV, VI - nystagmus noted. V - Facial sensation intact bilaterally. VII - unable to assess  VIII - Hearing & vestibular intact bilaterally. X - unable to assess  Motor Strength - left side flaccid. Antigravity purposeful movements on right  Motor Tone - Muscle tone was assessed at the neck and appendages and was normal.  Sensory - Light touch, temperature/pinprick were assessed and were symmetrical.  Coordination - The patient had normal movements in the hands and feet with no ataxia or dysmetria.  Tremor was absent.  Gait and Station - deferred.   ASSESSMENT/PLAN Timothy Good is a 55 y.o. male with a past medical history significant for uncontrolled hypertension, CKD stage IV, heart failure, hyperlipidemia, daily drinking, medication nonadherence, iron deficiency anemia/anemia of chronic disease, presenting with a pontine hemorrhage. ICH Score: 3.     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  CT head right pontine hemorrhage with midbrain and medulla extension, IVH at fourth ventricle MRI  Acute brainstem hemorrhage centered at the pons. Extensive pontine edema, which tracks associated  edema which tracks into the midbrain right > left and into the right medullary pyramid. Mild extension of hemorrhage into the 4th ventricle and basilar subarachnoid spaces. But no ventriculomegaly. No midline shift or impending herniation. Negative noncontrast MRI appearance of the orbits. CT repeat stable hematoma, no hydrocephalus Consider MRA to rule out AVM or aneurysm once hematoma resolves. 2D Echo EF 55 to 60%.  LDL 75 HgbA1c 5.4 VTE prophylaxis - Heparin subcu Not on Anticoagulant or antiplatelet prior to admission, now on no antithrombotics due to [place] Therapy recommendations:  CIR Disposition: Pending   Cerebral Edema Neurosurgery consulted, no acute intervention recommended at this time. Off 3% saline allow sodium to trend down with daily check   Na 139   Acute respiratory failure Probable aspiration pneumonia Remains intubated, appreciate CCM management First on Unasyn->Rocephin->off Afebrile  Resp culture: few gram positive cocci PEG 10/18 with Trauma Trach 10/21 with ENT    Hypertensive emergency Home meds: none Stable now BP goal less than 160  Currently on Norvasc 10, Coreg 25, hydralazine 50 Q8 Long-term BP goal normotensive   Hyperlipidemia Home meds:  None LDL  75, almost at goal < 70 May consider statin at discharge   AKI on CKD Ischemic ATN Anemia  Hyperkalemia  Creatinine 3.65-3.80-5.2-6.27-5.69-5.46-5.44-5.60-5.53->5.90->6.3 Hb 8.2->7.6->7.7->7.3->7.1->6.6->PRBC->8.1->7.6-.7.6 K 5.3- daily lokelma On tube feeding and free water  Urine adequate No acute need for dialysis Nephrology on board    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 Eulas Post Cipro eyedrop during the day and erythromycin ointment nightly Continue Vit C 2g and docxy 100 bid Appreciate ophthal Dr. Roda Shutters help   ETOH use disorder with  dependence Daily use of excessive liquor per family (?1/2 bottle vodka)  Thiamine, folate and MVI on board Vitamin B12 -213 Monitor s/s of withdrawal, need for CIWA    Dysphagia Continue tube feeding and free water  Speech on board CT Chest Abd Pelvis: generalized gastric wall thickening, with associated fat stranding in the gastrohepatic ligament.Findings are nonspecific and could be due to noninflammatory edema,gastritis, peptic ulcer disease or neoplasm.  PEG done 10/18   Tobacco abuse Current smoker Smoking cessation counseling will be provided   Other Stroke Risk Factors Current Cigarette smoker, will be advised to stop smoking ETOH use disorder alcohol level <10, advised to drink no more than 2 drink(s) a day   Other Active Problems Multinodular goiter   Hospital day # 15  Beulah Gandy, NP I have personally obtained history,examined this patient, reviewed notes, independently viewed imaging studies, participated in medical decision making and plan of care.ROS completed by me personally and pertinent positives fully documented  I have made any additions or clarifications directly to the above  note. Agree with note above.  Patient remains critically ill with respiratory failure on t ventilator and now with tracheostomy.  Continue weaning off ventilatory support as tolerated.  Continue strict blood pressure control systolic goal below 329.  Continue ongoing therapies.  Discussed with Dr. Ander Slade critical care medicine.This patient is critically ill and at significant risk of neurological worsening, death and care requires constant monitoring of vital signs, hemodynamics,respiratory and cardiac monitoring, extensive review of multiple databases, frequent neurological assessment, discussion with family, other specialists and medical decision making of high complexity.I have made any additions or clarifications directly to the above note.This critical care time does not reflect procedure  time, or teaching time or supervisory time of PA/NP/Med Resident etc but could involve care discussion time.  I spent 30 minutes of neurocritical care time  in the care of  this patient.      Antony Contras, MD Medical Director [facility] Pager: [phone] 01/21/2021 3:15 PM  To contact Stroke Continuity provider, please refer to [email or web address] After hours, contact General Neurology

## 2021-01-21 NOTE — Progress Notes (Signed)
This RN walked into patient's room to assess vent alarm that was going off. Patient was coughing. This RN assessed trach site, which was bleeding much heavier than was previously during shift. Gauze applied to help control bleeding. ENT, RT, and CCM(E-link) notified. Dr. Redmond Baseman of ENT came to bedside. See MD note. Patient hemodynamically stable at this time. Will continue to monitor.

## 2021-01-21 NOTE — Progress Notes (Addendum)
[facility] NEPHROLOGY PROGRESS NOTE  Assessment/ Plan:  # AKI on CKD3: Underlying chronic kidney disease from hypertension (followed by Dr. Theador Hawthorne in [place]) and his acute kidney injury appears to be from ischemic ATN.    No acute need for dialysis but seems increasingly likley.  See below in [place].  He is responsive to diuretics as of now.    Azotemia is 2/2 low gfr, and high protein intake, Family considering options.  Palliative consulted.  We will continue daily lab, strict ins and outs and watch for renal recovery.    # Hypertension: Blood pressures currently well controlled on a combination of amlodipine, carvedilol and hydralazine.  Continue to hold ACE inhibitor and monitor with diuretic trial.  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  # Acute hypoxic respiratory failure/VDRF: managed by pulmonary team.  S/p Trach 10/21  # Anemia, transfuse prn  #Hyperkalemia, mild: daily Lokelma  Subjective:  SCr stable 6.3 (was 6.47), BUN up to 166, K 5.3 2.1L UOP S/pt Trach yesterday Discussed with patient's daughter over phone, [name], that he is nearing dialysis dependence from his renal failure.  Discussed that patients with tracheostomy who require dialysis oftentimes have difficulty leaving the hospital or finding a facility to provide care locally.  No hard indication to initiate dialysis today but azotemia continues to worsen.  If family understands the potential road ahead if dialysis is started and they wish to do that, would be willing to provide, but wanted to give him an opportunity to discuss.  [name] will talk to her family and get back to [place].  Palliative care consult is pending.   Objective Vital signs in last 24 hours: Vitals:   01/21/21 0700 01/21/21 0748 01/21/21 0753 01/21/21 0800  BP: 112/74 (!) 119/99  114/77  Pulse: 81 78  77  Resp: _0 Temp:       TempSrc:      SpO2: 96% 98% 97% 98%  Weight:      Height:       Weight change: -5.4 kg  Intake/Output Summary (Last 24 hours) at 01/21/2021 0849 Last data filed at 01/21/2021 0800 Gross per 24 hour  Intake 2211.75 ml  Output 2100 ml  Net 111.75 ml        Labs: Basic Metabolic Panel: Recent Labs  Lab 01/30/2021 0354 01/18/21 0431 01/19/21 0346 01/18/2021 0332 01/21/21 0356  NA 142 136 141 140 139  K 5.4* 5.0 5.3* 4.9 5.3*  CL 108 103 105 103 101  CO2 21* 19* 20* 21* 22  GLUCOSE 90 117* 113* 94 107*  BUN 102* 122* 134* 156* 166*  CREATININE 5.60* 5.53* 5.90* 6.47* 6.30*  CALCIUM 10.3 9.8 9.8 9.9 10.1  PHOS 5.3* 7.2*  --  8.2*  --     Liver Function Tests: Recent Labs  Lab 01/18/2021 0354 01/18/21 0431 01/03/2021 0332  ALBUMIN 1.7* 1.9* 1.9*    No results for input(s): LIPASE, AMYLASE in the last 168 hours.  No results for input(s): AMMONIA in the last 168 hours. CBC: Recent Labs  Lab 01/16/21 0500 01/01/2021 0354 01/03/2021 1559 01/18/21 0431 01/19/21 0346 01/21/21 0456  WBC 12.6* 14.2*  --  11.8* 9.2 12.1*  NEUTROABS  --   --   --   --  6.0  --   HGB 7.1* 6.6*   < > 8.1* 7.6* 7.6*  HCT 22.6* 21.7*   < > 25.4*  23.9* 23.9*  MCV 92.6 95.2  --  89.1 89.5 89.2  PLT 240 271  --  334 358 397   < > = values in this interval not displayed.    Cardiac Enzymes: No results for input(s): CKTOTAL, CKMB, CKMBINDEX, TROPONINI in the last 168 hours.  CBG: Recent Labs  Lab 01/09/2021 1536 01/10/2021 1948 01/16/2021 2336 01/21/21 0339 01/21/21 0819  GLUCAP 98 97 97 104* 120*     Iron Studies: No results for input(s): IRON, TIBC, TRANSFERRIN, FERRITIN in the last 72 hours. Studies/Results: DG CHEST PORT 1 VIEW  Result Date: 01/14/2021 CLINICAL DATA:  Postop trach placement. EXAM: PORTABLE CHEST 1 VIEW COMPARISON:  Radiograph earlier today. FINDINGS: Removal of endotracheal tube with no tracheostomy tube in place. Tracheostomy tube tip projects over the clavicular  heads. Persistent low lung volumes. Bibasilar atelectasis, with confluent retrocardiac opacity. Stable heart size and mediastinal contours. No pneumothorax. There may be small pleural effusions. IMPRESSION: 1. Tracheostomy tube tip projects over the clavicular heads. 2. Persistent low lung volumes with bibasilar atelectasis and confluent retrocardiac opacity. Possible small pleural effusions. Electronically Signed   By: Keith Rake M.D.   On: 01/19/2021 19:59   DG Chest Port 1 View  Result Date: 01/30/2021 CLINICAL DATA:  ET tube present, respiratory failure EXAM: PORTABLE CHEST 1 VIEW COMPARISON:  Chest radiograph 01/13/2021 FINDINGS: The endotracheal tube tip is approximately 2.8 cm from the carina. The cardiomediastinal silhouette is stable. Linear opacity projecting over the lateral left base likely reflect subsegmental atelectasis. There is no other focal consolidation or pulmonary edema. There is no pleural effusion or pneumothorax. There is no acute osseous abnormality. IMPRESSION: 1. Endotracheal tube in satisfactory position. 2. Mild left basilar subsegmental atelectasis. Otherwise, no radiographic evidence of acute cardiopulmonary process. Electronically Signed   By: Valetta Mole M.D.   On: 01/28/2021 12:05    Medications: Infusions:  sodium chloride Stopped (01/14/21 1454)   feeding supplement (NEPRO CARB STEADY) 1,000 mL (01/21/21 0027)    Scheduled Medications:  amLODipine  10 mg Per Tube Daily   vitamin C  2,000 mg Per Tube Daily   carvedilol  25 mg Per Tube BID WC   chlorhexidine gluconate (MEDLINE KIT)  15 mL Mouth Rinse BID   Chlorhexidine Gluconate Cloth  6 each Topical Daily   ciprofloxacin  2 drop Right Eye Q4H while awake   doxycycline  100 mg Per Tube Q12H   erythromycin   Right Eye Q6H   feeding supplement (PROSource TF)  45 mL Per Tube TID   fentaNYL (SUBLIMAZE) injection  200 mcg Intravenous Once   fentaNYL (SUBLIMAZE) injection  50 mcg Intravenous Once   free  water  200 mL Per Tube Q6H   heparin injection (subcutaneous)  5,000 Units Subcutaneous Q8H   hydrALAZINE  50 mg Per Tube Q8H   insulin aspart  0-6 Units Subcutaneous Q4H   mouth rinse  15 mL Mouth Rinse 10 times per day   midazolam  5 mg Intravenous Once   mupirocin ointment  1 application Nasal BID   pantoprazole sodium  40 mg Per Tube Daily   propofol  50 mg Intravenous Once   sodium bicarbonate  650 mg Per Tube TID   sodium zirconium cyclosilicate  10 g Per Tube Daily   thiamine  100 mg Per Tube Daily   torsemide  40 mg Per Tube Daily   vecuronium  10 mg Intravenous Once    have reviewed scheduled and prn medications.  Physical  Exam: General: Ill-looking male on mechanical ventilation, Heart:RRR, s1s2 nl Lungs: Coarse breath sound bilateral. Abdomen:soft, Non-tender, non-distended Extremities:No edema Neurology: Not responding and not following command   Ami Mally B Ori Kreiter 01/21/2021,8:49 AM  LOS: 15 days

## 2021-01-21 NOTE — Progress Notes (Addendum)
   NAME:  Timothy Good, MRN:  [id number], DOB:  05/05/1965, LOS: 56 ADMISSION DATE:  01/24/2021, CONSULTATION DATE:  01/25/2021 REFERRING MD:  Curly [place], CHIEF COMPLAINT:  found down   History of Present Illness:  55 y/o male with multiple medical problems was found down by in a well call visit by police and was brought to [facility] with a diagnosis of pontine hemorrhage.  He was intubated for airway protection and then transferred to [facility] for further evaluation.  Pulmonary and critical care medicine was consulted for medical management in the setting of acute hemorrhagic stroke.    PMH - CKD -4  Significant Hospital Events: Including procedures, antibiotic start and stop dates in addition to other pertinent events   January 06, 2021 admission, intubation 10/8 MRI orbits appear normal 10/15 head CT no change in pontine hematoma, IVH decreased, no hydrocephalus Post tracheostomy 10/21   Interim History / Subjective:   Post tracheostomy Weaning well Awake and interactive Bleeding around trach site  Objective   Blood pressure 114/77, pulse 77, temperature (!) 97.4 F (36.3 C), temperature source Axillary, resp. rate 17, height 6\' 2"  (1.88 m), weight 73.8 kg, SpO2 98 %.    Vent Mode: PSV;CPAP FiO2 (%):  [40 %] 40 % Set Rate:  [12 bmp] 12 bmp Vt Set:  [490 mL] 490 mL PEEP:  [5 cmH20] 5 cmH20 Pressure Support:  [10 cmH20] 10 cmH20   Intake/Output Summary (Last 24 hours) at 01/21/2021 0955 Last data filed at 01/21/2021 0900 Gross per 24 hour  Intake 2361.75 ml  Output 1870 ml  Net 491.75 ml   Filed Weights   01/16/21 1800 01/09/2021 0358 01/21/21 0500  Weight: 74.6 kg 79.2 kg 73.8 kg    Examination: General: On vent HEENT: Bilateral proptosis, post tarsorrhaphy on the right Neuro: Left hemiplegia, right side remains weak CV: S1-S2 appreciated PULM: Clear breath sounds bilaterally GI: Bowel sounds appreciated Extremities: warm/dry, no edema  Skin: no  rashes or lesions   POC US shows large goiter overlying tracheostomy.   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  Unlikely to have intact swallowing given pons and medullary involvement.  Plan:   -Given relatively small size of bleed and patient's young age may have reasonable neurological recovery by 3 to 6 months. -Continue daily SBT -Continue enteral blood pressure medication  Best Practice (right click and "Reselect all SmartList Selections" daily)   Diet/type: tubefeeds DVT prophylaxis: SCD GI prophylaxis: H2B Lines: N/A Foley:  Yes, and it is still needed Code Status:  full code  Last date of multidisciplinary goals of care discussion : Spoke to patient's daughter, [name], who agrees to tracheostomy and PEG tube as bridge to potential recovery.  She understands that best recovery will take up to 4 months and may not be complete.  Sherrilyn Rist, MD [facility] Pager: See Shea Evans

## 2021-01-21 NOTE — Progress Notes (Signed)
Subjective: Trach site began bleeding overnight.  Objective: Vital signs in last 24 hours: Temp:  [97.4 F (36.3 C)-97.7 F (36.5 C)] 97.4 F (36.3 C) (10/22 0000) Pulse Rate:  [69-81] 77 (10/22 0300) Resp:  [12-24] 15 (10/22 0300) BP: (96-123)/(58-84) 105/70 (10/22 0300) SpO2:  [94 %-100 %] 94 % (10/22 0300) FiO2 (%):  [40 %] 40 % (10/22 0300) Wt Readings from Last 1 Encounters:  01/13/2021 79.2 kg    Intake/Output from previous day: 10/21 0701 - 10/22 0700 In: 1646.8 [I.V.:200; NG/GT:1371.8] Out: 1500 [Urine:1500] Intake/Output this shift: Total I/O In: 731.8 [Other:75; NG/GT:656.8] Out: 500 [Urine:500]  General appearance: awake, mechanically ventilated with cuffed trach in place Neck: active dark bleeding from trach site  Recent Labs    01/18/21 0431 01/19/21 0346  WBC 11.8* 9.2  HGB 8.1* 7.6*  HCT 25.4* 23.9*  PLT 334 358    Recent Labs    01/19/21 0346 01/09/2021 0332  NA 141 140  K 5.3* 4.9  CL 105 103  CO2 20* 21*  GLUCOSE 113* 94  BUN 134* 156*  CREATININE 5.90* 6.47*  CALCIUM 9.8 9.9    Medications: I have reviewed the patient's current medications.  Assessment/Plan: Tracheostomy site bleeding  Two 6 inch Vaseline gauze strips were packed into trach site.  Bleeding controlled with this and some pressure.  Plan to leave packing in a few days.   LOS: 15 days   Melida Quitter 01/21/2021, 4:00 AM

## 2021-01-22 DIAGNOSIS — N186 End stage renal disease: Secondary | ICD-10-CM | POA: Diagnosis not present

## 2021-01-22 DIAGNOSIS — Z66 Do not resuscitate: Secondary | ICD-10-CM

## 2021-01-22 DIAGNOSIS — R627 Adult failure to thrive: Secondary | ICD-10-CM

## 2021-01-22 DIAGNOSIS — Z515 Encounter for palliative care: Secondary | ICD-10-CM | POA: Diagnosis not present

## 2021-01-22 LAB — CBC
HCT: 23 % — ABNORMAL LOW (ref 39.0–52.0)
Hemoglobin: 7.3 g/dL — ABNORMAL LOW (ref 13.0–17.0)
MCH: 28.1 pg (ref 26.0–34.0)
MCHC: 31.7 g/dL (ref 30.0–36.0)
MCV: 88.5 fL (ref 80.0–100.0)
Platelets: 390 10*3/uL (ref 150–400)
RBC: 2.6 MIL/uL — ABNORMAL LOW (ref 4.22–5.81)
RDW: 17.2 % — ABNORMAL HIGH (ref 11.5–15.5)
WBC: 11.8 10*3/uL — ABNORMAL HIGH (ref 4.0–10.5)
nRBC: 0 % (ref 0.0–0.2)

## 2021-01-22 LAB — BASIC METABOLIC PANEL
Anion gap: 18 — ABNORMAL HIGH (ref 5–15)
BUN: 172 mg/dL — ABNORMAL HIGH (ref 6–20)
CO2: 22 mmol/L (ref 22–32)
Calcium: 9.8 mg/dL (ref 8.9–10.3)
Chloride: 99 mmol/L (ref 98–111)
Creatinine, Ser: 6.23 mg/dL — ABNORMAL HIGH (ref 0.61–1.24)
GFR, Estimated: 10 mL/min — ABNORMAL LOW (ref >60)
Glucose, Bld: 108 mg/dL — ABNORMAL HIGH (ref 70–99)
Potassium: 4.8 mmol/L (ref 3.5–5.1)
Sodium: 139 mmol/L (ref 135–145)

## 2021-01-22 LAB — GLUCOSE, CAPILLARY
Glucose-Capillary: 102 mg/dL — ABNORMAL HIGH (ref 70–99)
Glucose-Capillary: 101 mg/dL — ABNORMAL HIGH (ref 70–99)
Glucose-Capillary: 100 mg/dL — ABNORMAL HIGH (ref 70–99)
Glucose-Capillary: 94 mg/dL (ref 70–99)
Glucose-Capillary: 99 mg/dL (ref 70–99)
Glucose-Capillary: 108 mg/dL — ABNORMAL HIGH (ref 70–99)

## 2021-01-22 NOTE — Progress Notes (Addendum)
STROKE TEAM PROGRESS NOTE   INTERVAL HISTORY He is lying in bed in NAD on trach collar overnight and tolerated well.Marland Kitchen He is awake and alert, following commands.  No further bleeding along trach site.. No family at bedside.  Vital signs stable.  Neurological exam unchanged. Vitals:   01/22/21 1154 01/22/21 1200 01/22/21 1300 01/22/21 1400  BP:  99/66 95/62 103/65  Pulse:  78 73 79  Resp:  (!) 21 (!) 21 (!) 22  Temp: (!) 97.2 F (36.2 C)     TempSrc: Axillary     SpO2:  96% 96% 96%  Weight:      Height:       CBC:  Recent Labs  Lab 01/19/21 0346 01/21/21 0456 01/22/21 0140  WBC 9.2 12.1* 11.8*  NEUTROABS 6.0  --   --   HGB 7.6* 7.6* 7.3*  HCT 23.9* 23.9* 23.0*  MCV 89.5 89.2 88.5  PLT 358 397 638   Basic Metabolic Panel:  Recent Labs  Lab 01/18/21 0431 01/19/21 0346 01/16/2021 0332 01/21/21 0356 01/22/21 0140  NA 136   < > 140 139 139  K 5.0   < > 4.9 5.3* 4.8  CL 103   < > 103 101 99  CO2 19*   < > 21* 22 22  GLUCOSE 117*   < > 94 107* 108*  BUN 122*   < > 156* 166* 172*  CREATININE 5.53*   < > 6.47* 6.30* 6.23*  CALCIUM 9.8   < > 9.9 10.1 9.8  PHOS 7.2*  --  8.2*  --   --    < > = values in this interval not displayed.     IMAGING past 24 hours No results found.  PHYSICAL EXAM  Temp:  [97.2 F (36.2 C)-98 F (36.7 C)] 97.2 F (36.2 C) (10/23 1154) Pulse Rate:  [72-82] 79 (10/23 1400) Resp:  [6-24] 22 (10/23 1400) BP: (94-109)/(61-74) 103/65 (10/23 1400) SpO2:  [94 %-100 %] 96 % (10/23 1400) FiO2 (%):  [40 %] 40 % (10/23 1400) Weight:  [76 kg] 76 kg (10/23 0147)  General - Well nourished, well developed middle-aged African-American male status post tracheostomy on trach collar, in no apparent distress.  Ophthalmologic - fundi not visualized due to noncooperation.  Cardiovascular - Regular rhythm and rate.  Mental Status -  Patient is status post tracheostomy.  On trach collar.  Neuro Awake and alert. Follows commands. Right eye sutured shut.  Left  eye saccadic dysmetria and nystagmus on left gaze.  Cranial Nerves II - XII - II - Visual field intact OU. III, IV, VI - nystagmus noted. V - Facial sensation intact bilaterally. VII - unable to assess  VIII - Hearing & vestibular intact bilaterally. X - unable to assess  Motor Strength - left side flaccid. Antigravity purposeful movements on right  Motor Tone - Muscle tone was assessed at the neck and appendages and was normal.  Sensory - Light touch, temperature/pinprick were assessed and were symmetrical.    Coordination - The patient had normal movements in the hands and feet with no ataxia or dysmetria.  Tremor was absent.  Gait and Station - deferred.   ASSESSMENT/PLAN Timothy Good is a 55 y.o. male with a past medical history significant for uncontrolled hypertension, CKD stage IV, heart failure, hyperlipidemia, daily drinking, medication nonadherence, iron deficiency anemia/anemia of chronic disease, presenting with a pontine hemorrhage. ICH Score: 3.     ICH -Right Pontine hemorrhage with IVH at 4th  ventricle likely due to uncontrolled severe hypertension.  CT head right pontine hemorrhage with midbrain and medulla extension, IVH at fourth ventricle MRI  Acute brainstem hemorrhage centered at the pons. Extensive pontine edema, which tracks associated edema which tracks into the midbrain right > left and into the right medullary pyramid. Mild extension of hemorrhage into the 4th ventricle and basilar subarachnoid spaces. But no ventriculomegaly. No midline shift or impending herniation. Negative noncontrast MRI appearance of the orbits. CT repeat stable hematoma, no hydrocephalus Consider MRA to rule out AVM or aneurysm once hematoma resolves. 2D Echo EF 55 to 60%.  LDL 75 HgbA1c 5.4 VTE prophylaxis - Heparin subcu Not on Anticoagulant or antiplatelet prior to admission, now on no antithrombotics due to [place] Therapy recommendations:  CIR Disposition: Pending   Cerebral  Edema Neurosurgery consulted, no acute intervention recommended at this time. Off 3% saline allow sodium to trend down with daily check   Na 139   Acute respiratory failure Probable aspiration pneumonia Remains intubated, appreciate CCM management First on Unasyn->Rocephin->off Afebrile  Resp culture: few gram positive cocci PEG 10/18 with Trauma Trach 10/21 with ENT    Hypertensive emergency Home meds: none Stable now BP goal less than 160  Currently on Norvasc 10, Coreg 25, hydralazine 50 Q8 Long-term BP goal normotensive   Hyperlipidemia Home meds:  None LDL  75, almost at goal < 70 May consider statin at discharge   AKI on CKD Ischemic ATN Anemia  Hyperkalemia  Creatinine 3.65-3.80-5.2-6.27-5.69-5.46-5.44-5.60-5.53->5.90->6.3 Hb 8.2->7.6->7.7->7.3->7.1->6.6->PRBC->8.1->7.6-.7.6 K 5.3- daily lokelma On tube feeding and free water  Urine adequate No acute need for dialysis Nephrology on board    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 Eulas Post Cipro eyedrop during the day and erythromycin ointment nightly Continue Vit C 2g and docxy 100 bid Appreciate ophthal Dr. Roda Shutters help   ETOH use disorder with dependence Daily use of excessive liquor per family (?1/2 bottle vodka)  Thiamine, folate and MVI on board Vitamin B12 -213 Monitor s/s of withdrawal, need for CIWA    Dysphagia Continue tube feeding and free water  Speech on board CT Chest Abd Pelvis: generalized gastric wall thickening, with associated fat stranding in the gastrohepatic ligament.Findings are nonspecific and could be due to noninflammatory edema,gastritis, peptic ulcer disease or neoplasm.  PEG done 10/18   Tobacco abuse Current smoker Smoking cessation counseling will be provided   Other Stroke Risk Factors Current Cigarette smoker, will be advised to stop  smoking ETOH use disorder alcohol level <10, advised to drink no more than 2 drink(s) a day   Other Active Problems Multinodular goiter   Hospital day # 16  Patient has been transition to trach collar now   Continue strict blood pressure control systolic goal below 782.  Nephrology following and do not recommend dialysis at this moment.  Continue ongoing therapies.  We will asked medical hospitalist team to resume care.  Hopefully transfer to the neurological floor bed the next day or 2.  Discussed with Dr. Ander Slade critical care medicine.This patient is critically ill and at significant risk of neurological worsening, death and care requires constant monitoring of vital signs, hemodynamics,respiratory and cardiac monitoring, extensive review of multiple databases, frequent neurological assessment, discussion with family, other specialists and medical decision making of high complexity.I have made any additions or clarifications directly to the above note.This critical care time does not reflect procedure time, or  teaching time or supervisory time of PA/NP/Med Resident etc but could involve care discussion time.  I spent 30 minutes of neurocritical care time  in the care of  this patient.      Antony Contras, MD Medical Director [place] Pager: [phone] 01/22/2021 2:40 PM  To contact Stroke Continuity provider, please refer to [email or web address] After hours, contact General Neurology

## 2021-01-22 NOTE — Progress Notes (Signed)
[facility] NEPHROLOGY PROGRESS NOTE  Assessment/ Plan:  # AKI on CKD3: Underlying chronic kidney disease from hypertension (followed by Dr. Theador Hawthorne in [place]) and his acute kidney injury appears to be from ischemic ATN.   Discussed with daughter Timothy Good and her mother today over the phone patient's current status and renal issues.  Reviewed that he has severe renal failure with azotemia but no other hard indications for dialysis.  Reviewed difficulty of trach/dialysis care once leaving the hospital including, prolonged stays in healthcare facilities and that potential facilities are [place] of state.  Elicited them to speak on patient's behalf and they felt that patient would not desire this type of care based upon previous conversations they have had with him.  My recommendation to them was that we not initiate dialysis but continue other supportive measures.  Palliative care is following and they have a family meeting scheduled for later today.  Timothy Good were in agreement not to start dialysis.  # Hypertension: Blood pressures currently well controlled on a combination of amlodipine, carvedilol and hydralazine.  Continue to hold ACE inhibitor and monitor with diuretic trial.  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  # Acute hypoxic respiratory failure/VDRF: managed by pulmonary team.  S/p Trach 10/21  # Anemia, transfuse prn  #Hyperkalemia, mild: daily Lokelma  Subjective:  SCr stable 6.2; BUN up to 1672 K 4.8 2.1L UOP Had another ocnversation with Timothy Good and Timothy Good by phone, see above   Objective Vital signs in last 24 hours: Vitals:   01/22/21 0700 01/22/21 0748 01/22/21 0800 01/22/21 0820  BP: 106/74  94/62 94/62  Pulse: 76  74 74  Resp: (!) 22  (!) 21 (!) 22  Temp:  (!) 97.3 F (36.3 C)    TempSrc:  Axillary    SpO2: 99%  97% 97%  Weight:      Height:        Weight change: 2.2 kg  Intake/Output Summary (Last 24 hours) at 01/22/2021 1034 Last data filed at 01/22/2021 0900 Gross per 24 hour  Intake 2200 ml  Output 1830 ml  Net 370 ml        Labs: Basic Metabolic Panel: Recent Labs  Lab 01/12/2021 0354 01/18/21 0431 01/19/21 0346 01/21/2021 0332 01/21/21 0356 01/22/21 0140  NA 142 136   < > 140 139 139  K 5.4* 5.0   < > 4.9 5.3* 4.8  CL 108 103   < > 103 101 99  CO2 21* 19*   < > 21* 22 22  GLUCOSE 90 117*   < > 94 107* 108*  BUN 102* 122*   < > 156* 166* 172*  CREATININE 5.60* 5.53*   < > 6.47* 6.30* 6.23*  CALCIUM 10.3 9.8   < > 9.9 10.1 9.8  PHOS 5.3* 7.2*  --  8.2*  --   --    < > = values in this interval not displayed.    Liver Function Tests: Recent Labs  Lab 01/21/2021 0354 01/18/21 0431 01/28/2021 0332  ALBUMIN 1.7* 1.9* 1.9*    No results for input(s): LIPASE, AMYLASE in the last 168 hours.  No results for input(s): AMMONIA in the last 168 hours. CBC: Recent Labs  Lab 01/09/2021 0354 01/01/2021 1559 01/18/21 0431 01/19/21 0346 01/21/21 0456 01/22/21 0140  WBC 14.2*  --  11.8* 9.2 12.1* 11.8*  NEUTROABS  --   --   --  6.0  --   --   HGB 6.6*   < > 8.1* 7.6* 7.6* 7.3*  HCT 21.7*   < > 25.4* 23.9* 23.9* 23.0*  MCV 95.2  --  89.1 89.5 89.2 88.5  PLT 271  --  334 358 397 390   < > = values in this interval not displayed.    Cardiac Enzymes: No results for input(s): CKTOTAL, CKMB, CKMBINDEX, TROPONINI in the last 168 hours.  CBG: Recent Labs  Lab 01/21/21 1545 01/21/21 1937 01/21/21 2350 01/22/21 0441 01/22/21 0736  GLUCAP 95 102* 111* 102* 101*     Iron Studies: No results for input(s): IRON, TIBC, TRANSFERRIN, FERRITIN in the last 72 hours. Studies/Results: DG CHEST PORT 1 VIEW  Result Date: 01/07/2021 CLINICAL DATA:  Postop trach placement. EXAM: PORTABLE CHEST 1 VIEW COMPARISON:  Radiograph earlier today. FINDINGS: Removal of endotracheal tube with no tracheostomy tube in place.  Tracheostomy tube tip projects over the clavicular heads. Persistent low lung volumes. Bibasilar atelectasis, with confluent retrocardiac opacity. Stable heart size and mediastinal contours. No pneumothorax. There may be small pleural effusions. IMPRESSION: 1. Tracheostomy tube tip projects over the clavicular heads. 2. Persistent low lung volumes with bibasilar atelectasis and confluent retrocardiac opacity. Possible small pleural effusions. Electronically Signed   By: Melanie  Sanford M.D.   On: 01/25/2021 19:59    Medications: Infusions:  sodium chloride Stopped (01/14/21 1454)   feeding supplement (NEPRO CARB STEADY) 1,000 mL (01/22/21 0300)    Scheduled Medications:  amLODipine  10 mg Per Tube Daily   vitamin C  2,000 mg Per Tube Daily   carvedilol  25 mg Per Tube BID WC   chlorhexidine gluconate (MEDLINE KIT)  15 mL Mouth Rinse BID   Chlorhexidine Gluconate Cloth  6 each Topical Daily   ciprofloxacin  2 drop Right Eye Q4H while awake   doxycycline  100 mg Per Tube Q12H   erythromycin   Right Eye Q6H   feeding supplement (PROSource TF)  45 mL Per Tube TID   fentaNYL (SUBLIMAZE) injection  200 mcg Intravenous Once   fentaNYL (SUBLIMAZE) injection  50 mcg Intravenous Once   free water  200 mL Per Tube Q6H   heparin injection (subcutaneous)  5,000 Units Subcutaneous Q8H   hydrALAZINE  50 mg Per Tube Q8H   insulin aspart  0-6 Units Subcutaneous Q4H   mouth rinse  15 mL Mouth Rinse 10 times per day   midazolam  5 mg Intravenous Once   mupirocin ointment  1 application Nasal BID   pantoprazole sodium  40 mg Per Tube Daily   propofol  50 mg Intravenous Once   sodium bicarbonate  650 mg Per Tube TID   sodium zirconium cyclosilicate  10 g Per Tube Daily   thiamine  100 mg Per Tube Daily   torsemide  40 mg Per Tube Daily   vecuronium  10 mg Intravenous Once    have reviewed scheduled and prn medications.  Physical Exam: General: Ill-looking male on Trach Heart:RRR, s1s2 nl Lungs:  Coarse breath sound bilateral. Abdomen:soft, Non-tender, non-distended Extremities:No edema Neurology: Nunchaged,  FC at times   Ryan B Sanford 01/22/2021,10:34 AM  LOS: 16 days   

## 2021-01-22 NOTE — Progress Notes (Addendum)
   NAME:  Timothy Good, MRN:  [id number], DOB:  [birth year], LOS: 24 ADMISSION DATE:  01/10/2021, CONSULTATION DATE:  01/14/2021 REFERRING MD:  Curly Shores, CHIEF COMPLAINT:  found down   History of Present Illness:  55 y/o male with multiple medical problems was found down by in a well call visit by police and was brought to [facility] with a diagnosis of pontine hemorrhage.  He was intubated for airway protection and then transferred to [facility] for further evaluation.  Pulmonary and critical care medicine was consulted for medical management in the setting of acute hemorrhagic stroke.    PMH - CKD -4  Significant Hospital Events: Including procedures, antibiotic start and stop dates in addition to other pertinent events   January 06, 2021 admission, intubation 10/8 MRI orbits appear normal 10/15 head CT no change in pontine hematoma, IVH decreased, no hydrocephalus Post tracheostomy 10/21   Interim History / Subjective:   Post tracheostomy Off ventilator for 24 hours  Objective   Blood pressure 94/62, pulse 74, temperature (!) 97.3 F (36.3 C), temperature source Axillary, resp. rate (!) 22, height 6\' 2"  (1.88 m), weight 76 kg, SpO2 97 %.    FiO2 (%):  [40 %] 40 %   Intake/Output Summary (Last 24 hours) at 01/22/2021 1057 Last data filed at 01/22/2021 0900 Gross per 24 hour  Intake 2200 ml  Output 1830 ml  Net 370 ml   Filed Weights   01/05/2021 0358 01/21/21 0500 01/22/21 0147  Weight: 79.2 kg 73.8 kg 76 kg    Examination: General: Off vent HEENT: Bilateral proptosis, post tarsorrhaphy on the right Neuro: Left hemiplegia, weak right side CV: S1-S2 appreciated PULM: Clear breath sounds bilaterally GI: Bowel sounds appreciated  POC US shows large goiter overlying tracheostomy.   Resolved Hospital Problem list   Induced hypernatremia Acute encephalopathy   Assessment & Plan:   Acute pontine hemorrhage with intraventricular hemorrhage likely in the  setting of uncontrolled hypertension  Acute respiratory failure Secondary to pontine hemorrhage -Post tracheostomy 10/21 -Post PEG 10/18 -On trach collar  Acute kidney injury on chronic kidney disease stage IV Goals of care discussions ongoing -Discussion so far leaning towards no dialysis because of his complex comorbidities -No acute need for dialysis at present -He is making urine  Dysphagia -Feeding tube in place  History of alcohol dependence -Beyond timeline for acute withdrawal -On thiamine, MVI  Multinodular goiter  History of tobacco abuse  Unfortunate location of hemorrhage.  Unlikely to have intact swallowing given pons and medullary involvement.  Plan:  -Continue supportive measures -Continue enteral blood pressure medication  Best Practice (right click and "Reselect all SmartList Selections" daily)   Diet/type: tubefeeds DVT prophylaxis: SCD GI prophylaxis: H2B Lines: N/A Foley:  Yes, and it is still needed Code Status:  full code  Last date of multidisciplinary goals of care discussion : Spoke to patient's daughter, [name], who agrees to tracheostomy and PEG tube as bridge to potential recovery.  She understands that best recovery will take up to 4 months and may not be complete.  Sherrilyn Rist, MD [place] [facility] Pager: See Shea Evans

## 2021-01-22 NOTE — Progress Notes (Signed)
Daily Progress Note   Patient Name: Timothy Good       Date: 01/22/2021 DOB: February 23, 1966  Age: 55 y.o. MRN#: [id number] Attending Physician: Stroke, Md, MD Primary Care Physician: Raiford Simmonds., PA-C Admit Date: 12/31/2020  Reason for Consultation/Follow-up: Establishing goals of care and Psychosocial/spiritual support  Family face treatment option decisions, advanced directive decisions and anticipatory care needs.  Today's discussion This nurse practitioner reviewed medical records, received report from team assessed the patient and then met with the patient's family to include his daughter, son, several brothers and sisters, aunts and uncles to discuss current medical situation; diagnosis, prognosis, goals of care, end-of-life wishes, disposition and options.  Dr Ander Slade updated family; questions and concerns addressed.  Created space and opportunity for family to explore their thoughts and feelings regarding patient's current medical situation.  Both patient's son and daughter clearly verbalized an understanding and knowing that their father would not want aggressive medical interventions to "simply just  prolong life" unless it meant a return to his baseline and independence.  They have an understanding that he would never want to live in a nursing home and be dependent on others for care.  His daughter/Shamika tearfully expressed that he "probably would not even want what we have done already; the trach and PEG"   Education offered on concept specific to human mortality, adult failure to thrive and the limitations of medical interventions to prolong quality of life when the body does fail to thrive.  Detailed education offered regarding the difference between an aggressive medical  intervention path and a palliative comfort path for this patient at this time in this situation.  Detailed discussion had specifically to initiation of dialysis; this case scenario versus worst-case scenario.  Family has made definitive decision for no dialysis now or into the future. Family understand that likely renal function will continue to decline with associated limited prognosis.  Education offered on the natural trajectory and expectations at end-of-life.  Education offered on hospice benefit, hospice philosophy and residential hospice eligibility.    Length of Stay: 16  Current Medications: Scheduled Meds:  . amLODipine  10 mg Per Tube Daily  . vitamin C  2,000 mg Per Tube Daily  . carvedilol  25 mg Per Tube BID WC  . chlorhexidine gluconate (MEDLINE KIT)  15 mL  Mouth Rinse BID  . Chlorhexidine Gluconate Cloth  6 each Topical Daily  . ciprofloxacin  2 drop Right Eye Q4H while awake  . doxycycline  100 mg Per Tube Q12H  . erythromycin   Right Eye Q6H  . feeding supplement (PROSource TF)  45 mL Per Tube TID  . fentaNYL (SUBLIMAZE) injection  200 mcg Intravenous Once  . fentaNYL (SUBLIMAZE) injection  50 mcg Intravenous Once  . free water  200 mL Per Tube Q6H  . heparin injection (subcutaneous)  5,000 Units Subcutaneous Q8H  . hydrALAZINE  50 mg Per Tube Q8H  . insulin aspart  0-6 Units Subcutaneous Q4H  . mouth rinse  15 mL Mouth Rinse 10 times per day  . midazolam  5 mg Intravenous Once  . mupirocin ointment  1 application Nasal BID  . pantoprazole sodium  40 mg Per Tube Daily  . propofol  50 mg Intravenous Once  . sodium bicarbonate  650 mg Per Tube TID  . sodium zirconium cyclosilicate  10 g Per Tube Daily  . thiamine  100 mg Per Tube Daily  . torsemide  40 mg Per Tube Daily  . vecuronium  10 mg Intravenous Once    Continuous Infusions: . sodium chloride Stopped (01/14/21 1454)  . feeding supplement (NEPRO CARB STEADY) 1,000 mL (01/22/21 0300)    PRN  Meds: sodium chloride, acetaminophen **OR** acetaminophen (TYLENOL) oral liquid 160 mg/5 mL **OR** acetaminophen, fentaNYL (SUBLIMAZE) injection, labetalol, polyvinyl alcohol  Physical Exam          Vital Signs: BP 94/61   Pulse 74   Temp (!) 97.2 F (36.2 C) (Axillary)   Resp (!) 22   Ht _0  (1.88 m)   Wt 76 kg   SpO2 96%   BMI 21.51 kg/m  SpO2: SpO2: 96 % O2 Device: O2 Device: Tracheostomy Collar O2 Flow Rate: O2 Flow Rate (L/min): 10 L/min  Intake/output summary:  Intake/Output Summary (Last 24 hours) at 01/22/2021 1221 Last data filed at 01/22/2021 1100 Gross per 24 hour  Intake 1912.75 ml  Output 2155 ml  Net -242.25 ml   LBM: Last BM Date: 01/22/21 Baseline Weight: Weight: 101 kg Most recent weight: Weight: 76 kg       Palliative Assessment/Data:  20 % at best      Patient Active Problem List   Diagnosis Date Noted  . Endotracheally intubated   . Acute respiratory failure with hypoxia ([place])   . Hypertension   . Malnutrition of moderate degree 01/10/2021  . Pontine hemorrhage ([place]) 01/23/2021  . Protein in urine   . Hypertensive crisis 01/23/2012  . Acute on chronic combined systolic and diastolic heart failure ([place]) 01/23/2012  . Acute on chronic renal failure ([place]) 01/23/2012  . Noncompliance with medication regimen 01/23/2012    Palliative Care Assessment & Plan   Patient Profile: 55 year old man with past medical history significant for chronic combined systolic and diastolic congestive heart failure, hypertension that has been poorly controlled with nonadherence and underlying chronic kidney disease stage III-IV for which he is followed by Dr. Theador Hawthorne in [place].    55 y/o male with multiple medical problems was found down by in a well call visit by police and was brought to [facility] with a diagnosis of pontine hemorrhage.  He was intubated for airway protection and then transferred to [facility] for further evaluation.       ICH -right Pontine hemorrhage with IVH at 4th ventricle likely due to  uncontrolled severe hypertension.  CT head right pontine hemorrhage with midbrain and medulla extension, IVH at fourth ventricle MRI  Acute brainstem hemorrhage centered at the pons. Extensive pontine edema, which tracks associated edema which tracks into the midbrain right > left and into the right medullary pyramid. Mild extension of hemorrhage into the 4th ventricle and basilar subarachnoid spaces. But no ventriculomegaly. No midline shift or impending herniation. Negative noncontrast MRI appearance of the orbits. CT repeat stable hematoma, no hydrocephalus   Currently patient is status post trach and PEG.  Today is day 16 of this hospitalization.   He has successfully been weaned off the ventilator.  He follows intermittent simple commands.  Family face treatment option decisions,advanced directive decisions and anticipatory care needs.      Recommendations/Plan: -  DNR/DNI -  No dialysis, now or in the future -  Continue current medical interventions however do not escalate care -   Decisions dependent on patient outcomes -  Continue discussion regarding transition of care, possibility of residential hospice discussed.  Palliative medicine will continue to support holistically.  I will reevaluate patient in the morning and update family.     Code Status:    Code Status Orders  (From admission, onward)           Start     Ordered   01/21/21 1804  Do not attempt resuscitation (DNR)  Continuous       Question Answer Comment  In the event of cardiac or respiratory ARREST Do not call a "code blue"   In the event of cardiac or respiratory ARREST Do not perform Intubation, CPR, defibrillation or ACLS   In the event of cardiac or respiratory ARREST Use medication by any route, position, wound care, and other measures to relive pain and suffering. May use oxygen, suction and manual treatment of airway  obstruction as needed for comfort.      01/21/21 1803           Code Status History     Date Active Date Inactive Code Status Order ID Comments User Context   12/31/2020 1707 01/21/2021 1803 Full Code [id number]  Lorenza Chick, MD Inpatient       Prognosis:  < 4 weeks  Discharge Planning: To Be Determined  Care plan was discussed with Dr Karsten Fells and Dr Leonie Man  Thank you for allowing the Palliative Medicine Team to assist in the care of this patient.   Time In: 1300 Time Out: 1400 Total Time 60 minutes Prolonged Time Billed  no       Greater than 50%  of this time was spent counseling and coordinating care related to the above assessment and plan.  Wadie Lessen, NP  Please contact Palliative Medicine Team phone at [phone] for questions and concerns.

## 2021-01-23 ENCOUNTER — Encounter (HOSPITAL_COMMUNITY): Payer: Self-pay | Admitting: Otolaryngology

## 2021-01-23 DIAGNOSIS — Z515 Encounter for palliative care: Secondary | ICD-10-CM | POA: Diagnosis not present

## 2021-01-23 DIAGNOSIS — J9601 Acute respiratory failure with hypoxia: Secondary | ICD-10-CM | POA: Diagnosis not present

## 2021-01-23 DIAGNOSIS — N186 End stage renal disease: Secondary | ICD-10-CM | POA: Diagnosis not present

## 2021-01-23 DIAGNOSIS — R627 Adult failure to thrive: Secondary | ICD-10-CM | POA: Diagnosis not present

## 2021-01-23 LAB — CBC
HCT: 23.2 % — ABNORMAL LOW (ref 39.0–52.0)
Hemoglobin: 7.2 g/dL — ABNORMAL LOW (ref 13.0–17.0)
MCH: 28 pg (ref 26.0–34.0)
MCHC: 31 g/dL (ref 30.0–36.0)
MCV: 90.3 fL (ref 80.0–100.0)
Platelets: 423 10*3/uL — ABNORMAL HIGH (ref 150–400)
RBC: 2.57 MIL/uL — ABNORMAL LOW (ref 4.22–5.81)
RDW: 17.1 % — ABNORMAL HIGH (ref 11.5–15.5)
WBC: 11.9 10*3/uL — ABNORMAL HIGH (ref 4.0–10.5)
nRBC: 0.2 % (ref 0.0–0.2)

## 2021-01-23 LAB — GLUCOSE, CAPILLARY
Glucose-Capillary: 108 mg/dL — ABNORMAL HIGH (ref 70–99)
Glucose-Capillary: 108 mg/dL — ABNORMAL HIGH (ref 70–99)
Glucose-Capillary: 109 mg/dL — ABNORMAL HIGH (ref 70–99)
Glucose-Capillary: 119 mg/dL — ABNORMAL HIGH (ref 70–99)
Glucose-Capillary: 94 mg/dL (ref 70–99)
Glucose-Capillary: 95 mg/dL (ref 70–99)

## 2021-01-23 LAB — BASIC METABOLIC PANEL
Anion gap: 19 — ABNORMAL HIGH (ref 5–15)
BUN: 179 mg/dL — ABNORMAL HIGH (ref 6–20)
CO2: 24 mmol/L (ref 22–32)
Calcium: 9.8 mg/dL (ref 8.9–10.3)
Chloride: 96 mmol/L — ABNORMAL LOW (ref 98–111)
Creatinine, Ser: 6.24 mg/dL — ABNORMAL HIGH (ref 0.61–1.24)
GFR, Estimated: 10 mL/min — ABNORMAL LOW (ref >60)
Glucose, Bld: 96 mg/dL (ref 70–99)
Potassium: 4.3 mmol/L (ref 3.5–5.1)
Sodium: 139 mmol/L (ref 135–145)

## 2021-01-23 MED ORDER — FENTANYL CITRATE PF 50 MCG/ML IJ SOSY: 25.0000 ug | PREFILLED_SYRINGE | INTRAMUSCULAR | Status: DC | PRN

## 2021-01-23 MED ORDER — HYDRALAZINE HCL 50 MG PO TABS: 50.0000 mg | ORAL_TABLET | Freq: Three times a day (TID) | ORAL | Status: DC | PRN

## 2021-01-23 MED ORDER — TORSEMIDE 20 MG PO TABS
20.0000 mg | ORAL_TABLET | Freq: Every day | ORAL | Status: DC
  Administered 2021-01-24: 20 mg
  Filled 2021-01-23: qty 1

## 2021-01-23 MED ORDER — FREE WATER
100.0000 mL | Freq: Four times a day (QID) | Status: DC
  Administered 2021-01-23 – 2021-01-28 (×21): 100 mL

## 2021-01-23 NOTE — Progress Notes (Signed)
NAME:  Timothy Good, MRN:  [id number], DOB:  [birth year], LOS: 8 ADMISSION DATE:  01/12/2021, CONSULTATION DATE:  01/24/2021 REFERRING MD:  Curly Shores, CHIEF COMPLAINT:  found down   History of Present Illness:  55 y/o male with multiple medical problems was found down by in a well call visit by police and was brought to [facility] with a diagnosis of pontine hemorrhage.  He was intubated for airway protection and then transferred to [facility] for further evaluation.  Pulmonary and critical care medicine was consulted for medical management in the setting of acute hemorrhagic stroke.    PMH - CKD -4  Significant Hospital Events: Including procedures, antibiotic start and stop dates in addition to other pertinent events   January 06, 2021 admission, intubation 10/8 MRI orbits appear normal 10/15 head CT no change in pontine hematoma, IVH decreased, no hydrocephalus Post tracheostomy 10/21 w/ENT-bled, packed by ENT 10/22- to ATC, PMT consulted, changed to DNR 10/24 additional PMT conversation- DNR w/ no escalation of care, no dialysis  Interim History / Subjective:  Remained off MV since 10/22, almost at 48 hrs  Afebrile  sCr stable Has not been getting most hypertensive meds due to softer blood pressures  Objective   Blood pressure 104/67, pulse 82, temperature 97.8 F (36.6 C), temperature source Axillary, resp. rate (!) 22, height 6\' 2"  (1.88 m), weight 75.4 kg, SpO2 96 %.    FiO2 (%):  [40 %] 40 %   Intake/Output Summary (Last 24 hours) at 01/23/2021 1020 Last data filed at 01/23/2021 0900 Gross per 24 hour  Intake 1925 ml  Output 1775 ml  Net 150 ml   Filed Weights   01/21/21 0500 01/22/21 0147 01/23/21 0405  Weight: 73.8 kg 76 kg 75.4 kg    Examination: General:  Adult male sitting upright in bed in NAD HEENT: MM pink/moist, poor dentition, R eye s/p tarsorrhaphy, left 3/reactive, midline trach- with old dark clotted blood/ packing Neuro:  Awake,  follows commands on RUE/ RLE 3/5, flaccid L CV: rr, NSR, no murmur PULM:  non labored, on ATC, diffuse rhonchi GI: soft, +bs, condom cath, RUQ PEG site wnl w/abd binder Extremities: warm/dry, no LE edema  Skin: no rashes   Some unmeasured urinary occurrences, measured UOP 1.7L/24 hrs, net +9.9L  Resolved Hospital Problem list   Induced hypernatremia Acute encephalopathy   Assessment & Plan:   Acute pontine hemorrhage with intraventricular hemorrhage likely in the setting of uncontrolled hypertension with cerebral edema  - Neurology primary - continue supportive care, likely will need CIR   Hypertensive emergency- resolved - hypertensive meds have been being held due to soft BP.   - holding norvasc, coreg, hydralazine.  Continuing torsemide as below - prn hydralazine and labetalol  - SBP goal < 160   Acute respiratory failure secondary to pontine hemorrhage - s/p tracheostomy 10/21 by ENT, required packing for bleeding that night> leave packing till ENT reassessment  - on trach collar for 48 hours now - is DNR with no escalation of care, including vent support - ongoing aggressive pulmonary hygiene   Acute kidney injury on chronic kidney disease stage IV, appearing to be from ischemic ATN - no dialysis, now or in future - sCr remains stable, good UOP - decrease free water to 164ml q 6hrs, Na 139 - continue torsemide and bicarb per Nephrology  - Nephrology following   Dysphagia - Post PEG 10/18 - TF at goal    History of alcohol dependence -  continue thiamine and MVI   Multinodular thyroid goiter -POC US shows large goiter overlying tracheostomy, no further workup at this time   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 Monica Martinez - continue cipro eyedrops and nightly erythromycin nightly - doxy per opthalmology   Best Practice (right click and "Reselect all SmartList Selections" daily)   Diet/type:  tubefeeds DVT prophylaxis: SCD GI prophylaxis: H2B Lines: N/A Foley:  N/A Code Status:  DNR- appreciate PMT care assistance.  Patient is DNR with no escalation of care, no dialysis.    No family at bedside 10/24.    Pending tx out of ICU, PCCM will sign off and be available as needed.  TRH will assume medical management.      Kennieth Rad, ACNP [facility] 01/23/2021, 11:01 AM  See Amion for pager If no response to pager, please call PCCM consult pager After 7:00 pm call Elink

## 2021-01-23 NOTE — Progress Notes (Signed)
Physical Therapy Treatment Patient Details Name: Timothy Good MRN: [id number] DOB: 31-Dec-1965 Today's Date: 01/23/2021   History of Present Illness 55 y.o. male presents to [facility] hospital on 01/28/2021, found down during a well check. Pt was emergently intubated and head CT revealed pontine hemorrhage. Intubated 10/7. PEG placed 10/19, trach 29/93 (complicated by goiter). PMH includes HTN, CKD stage IV, HLD, alcohol abuse, anemia.    PT Comments    Pt was able to tolerate <10 mins EOB with two person max assist today, is following basic commands ~85% of the time and is alert.  He sounds quite productive in his chest with mobility and had to be deep suctioned by the RN at the end of the session.  VSS throughout.  PT will continue to follow acutely for safe mobility progression.  Goals were updated today.   Recommendations for follow up therapy are one component of a multi-disciplinary discharge planning process, led by the attending physician.  Recommendations may be updated based on patient status, additional functional criteria and insurance authorization.  Follow Up Recommendations  Other (comment) (CIR)     Assistance Recommended at Discharge Frequent or constant Supervision/Assistance  Equipment Recommendations  Wheelchair (measurements PT);Wheelchair cushion (measurements PT);Hospital bed    Recommendations for Other Services Rehab consult     Precautions / Restrictions Precautions Precautions: Fall Precaution Comments: trach and peg placed last week.  Significant bleeding to trach on Friday.  Nursing instructed to leave it as is and ENT will undress it, L side weaker than R     Mobility  Bed Mobility Overal bed mobility: Needs Assistance Bed Mobility: Supine to Sit;Sit to Supine     Supine to sit: Max assist;+2 for physical assistance;HOB elevated Sit to supine: +2 for physical assistance;Max assist;HOB elevated   General bed mobility comments: Two person max assist to  help progress left leg and trunk up to sitting EOB.  Pt able to bring right leg across to command and pull weakly with his right (stronger) arm).  Max assist to return to supine supporting both legs and trunk.    Transfers                   General transfer comment: significant amount of assist in sitting EOB.    Ambulation/Gait                 Stairs             Wheelchair Mobility    Modified Rankin (Stroke Patients Only) Modified Rankin (Stroke Patients Only) Pre-Morbid Rankin Score: No significant disability Modified Rankin: Severe disability     Balance Overall balance assessment: Needs assistance Sitting-balance support: Feet supported;Bilateral upper extremity supported Sitting balance-Leahy Scale: Zero Sitting balance - Comments: max assist seated EOB. Postural control: Posterior lean;Left lateral lean     Standing balance comment: Pt unable to stand                            Cognition Arousal/Alertness: Awake/alert Behavior During Therapy: Flat affect Overall Cognitive Status: Difficult to assess                                 General Comments: Pt followed 85% of basic one step commands, did perseverate at times, did well with thumbs up/down.        Exercises      General Comments  General comments (skin integrity, edema, etc.): Increased RR and audible crackles.  RR up to 32 O2 sats remained stable on 40% FiO2 TC and 8L, RN in at end of session to deep suction.      Pertinent Vitals/Pain Pain Assessment: Faces Faces Pain Scale: No hurt Facial Expression: Relaxed, neutral Body Movements: Absence of movements Muscle Tension: Relaxed Compliance with ventilator (intubated pts.): N/A Vocalization (extubated pts.): Talking in normal tone or no sound CPOT Total: 0    Home Living                          Prior Function            PT Goals (current goals can now be found in the care plan  section) Acute Rehab PT Goals PT Goal Formulation: Patient unable to participate in goal setting Time For Goal Achievement: 02/06/21 Potential to Achieve Goals: Good Progress towards PT goals: Progressing toward goals    Frequency    Min 4X/week      PT Plan Current plan remains appropriate    Co-evaluation              AM-PAC PT "6 Clicks" Mobility   Outcome Measure  Help needed turning from your back to your side while in a flat bed without using bedrails?: Total Help needed moving from lying on your back to sitting on the side of a flat bed without using bedrails?: Total Help needed moving to and from a bed to a chair (including a wheelchair)?: Total Help needed standing up from a chair using your arms (e.g., wheelchair or bedside chair)?: Total Help needed to walk in hospital room?: Total Help needed climbing 3-5 steps with a railing? : Total 6 Click Score: 6    End of Session Equipment Utilized During Treatment: Oxygen Activity Tolerance: Patient limited by fatigue Patient left: in bed;with call bell/phone within reach;with bed alarm set;with nursing/sitter in room Nurse Communication: Mobility status PT Visit Diagnosis: Other abnormalities of gait and mobility (R26.89);Muscle weakness (generalized) (M62.81);Other symptoms and signs involving the nervous system (Z61.096)     Time: 1550-1616 PT Time Calculation (min) (ACUTE ONLY): 26 min  Charges:  $Therapeutic Activity: 23-37 mins                     Verdene Lennert, PT, DPT  Acute Rehabilitation Ortho Tech Supervisor [phone] pager [phone]) [phone] office

## 2021-01-23 NOTE — Progress Notes (Signed)
STROKE TEAM PROGRESS NOTE   INTERVAL HISTORY He is lying in bed in NAD on trach collar now for 2 days   and has tolerated it well.Marland Kitchen He is awake and alert, following commands.  No further bleeding along trach site.. No family at bedside.  Vital signs stable.  Neurological exam unchanged. Vitals:   01/23/21 1000 01/23/21 1100 01/23/21 1124 01/23/21 1200  BP: 110/75 113/74 113/74 124/82  Pulse: 83 86 84 87  Resp: (!) 22 (!) 26 (!) 26 (!) 23  Temp:    97.9 F (36.6 C)  TempSrc:    Axillary  SpO2: 95% 100% 100% 100%  Weight:      Height:       CBC:  Recent Labs  Lab 01/19/21 0346 01/21/21 0456 01/22/21 0140 01/23/21 0309  WBC 9.2   < > 11.8* 11.9*  NEUTROABS 6.0  --   --   --   HGB 7.6*   < > 7.3* 7.2*  HCT 23.9*   < > 23.0* 23.2*  MCV 89.5   < > 88.5 90.3  PLT 358   < > 390 423*   < > = values in this interval not displayed.   Basic Metabolic Panel:  Recent Labs  Lab 01/18/21 0431 01/19/21 0346 01/27/2021 0332 01/21/21 0356 01/22/21 0140 01/23/21 0309  NA 136   < > 140   < > 139 139  K 5.0   < > 4.9   < > 4.8 4.3  CL 103   < > 103   < > 99 96*  CO2 19*   < > 21*   < > 22 24  GLUCOSE 117*   < > 94   < > 108* 96  BUN 122*   < > 156*   < > 172* 179*  CREATININE 5.53*   < > 6.47*   < > 6.23* 6.24*  CALCIUM 9.8   < > 9.9   < > 9.8 9.8  PHOS 7.2*  --  8.2*  --   --   --    < > = values in this interval not displayed.     IMAGING past 24 hours No results found.  PHYSICAL EXAM  Temp:  [97.1 F (36.2 C)-98.6 F (37 C)] 97.9 F (36.6 C) (10/24 1200) Pulse Rate:  [73-87] 87 (10/24 1200) Resp:  [18-29] 23 (10/24 1200) BP: (95-124)/(61-82) 124/82 (10/24 1200) SpO2:  [90 %-100 %] 100 % (10/24 1200) FiO2 (%):  [35 %-40 %] 35 % (10/24 1124) Weight:  [75.4 kg] 75.4 kg (10/24 0405)  General - Well nourished, well developed middle-aged African-American male status post tracheostomy on trach collar, in no apparent distress.  Ophthalmologic - fundi not visualized due to  noncooperation.  Cardiovascular - Regular rhythm and rate.  Mental Status -  Patient is status post tracheostomy.  On trach collar.  Neuro Awake and alert. Follows commands. Right eye sutured shut.  Left eye saccadic dysmetria and nystagmus on left gaze.  Cranial Nerves II - XII - II - Visual field intact OU. III, IV, VI - nystagmus noted. V - Facial sensation intact bilaterally. VII - unable to assess  VIII - Hearing & vestibular intact bilaterally. X - unable to assess  Motor Strength - left side flaccid. Antigravity purposeful movements on right  Motor Tone - Muscle tone was assessed at the neck and appendages and was normal.  Sensory - Light touch, temperature/pinprick were assessed and were symmetrical.    Coordination -  The patient had normal movements in the hands and feet with no ataxia or dysmetria.  Tremor was absent.  Gait and Station - deferred.   ASSESSMENT/PLAN Timothy Good is a 55 y.o. male with a past medical history significant for uncontrolled hypertension, CKD stage IV, heart failure, hyperlipidemia, daily drinking, medication nonadherence, iron deficiency anemia/anemia of chronic disease, presenting with a pontine hemorrhage. ICH Score: 3.     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  CT head right pontine hemorrhage with midbrain and medulla extension, IVH at fourth ventricle MRI  Acute brainstem hemorrhage centered at the pons. Extensive pontine edema, which tracks associated edema which tracks into the midbrain right > left and into the right medullary pyramid. Mild extension of hemorrhage into the 4th ventricle and basilar subarachnoid spaces. But no ventriculomegaly. No midline shift or impending herniation. Negative noncontrast MRI appearance of the orbits. CT repeat stable hematoma, no hydrocephalus Consider MRA to rule out AVM or aneurysm once hematoma resolves. 2D Echo EF 55 to 60%.  LDL  75 HgbA1c 5.4 VTE prophylaxis - Heparin subcu Not on Anticoagulant or antiplatelet prior to admission, now on no antithrombotics due to [place] Therapy recommendations:  CIR Disposition: Pending   Cerebral Edema Neurosurgery consulted, no acute intervention recommended at this time. Off 3% saline allow sodium to trend down with daily check   Na 139   Acute respiratory failure Probable aspiration pneumonia Remains intubated, appreciate CCM management First on Unasyn->Rocephin->off Afebrile  Resp culture: few gram positive cocci PEG 10/18 with Trauma Trach 10/21 with ENT    Hypertensive emergency Home meds: none Stable now BP goal less than 160  Currently on Norvasc 10, Coreg 25, hydralazine 50 Q8 Long-term BP goal normotensive   Hyperlipidemia Home meds:  None LDL  75, almost at goal < 70 May consider statin at discharge   AKI on CKD Ischemic ATN Anemia  Hyperkalemia  Creatinine 3.65-3.80-5.2-6.27-5.69-5.46-5.44-5.60-5.53->5.90->6.3 Hb 8.2->7.6->7.7->7.3->7.1->6.6->PRBC->8.1->7.6-.7.6 K 5.3- daily lokelma On tube feeding and free water  Urine adequate No acute need for dialysis Nephrology on board    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 Eulas Post Cipro eyedrop during the day and erythromycin ointment nightly Continue Vit C 2g and docxy 100 bid Appreciate ophthal Dr. Roda Shutters help   ETOH use disorder with dependence Daily use of excessive liquor per family (?1/2 bottle vodka)  Thiamine, folate and MVI on board Vitamin B12 -213 Monitor s/s of withdrawal, need for CIWA    Dysphagia Continue tube feeding and free water  Speech on board CT Chest Abd Pelvis: generalized gastric wall thickening, with associated fat stranding in the gastrohepatic ligament.Findings are nonspecific and could be due to noninflammatory edema,gastritis,  peptic ulcer disease or neoplasm.  PEG done 10/18   Tobacco abuse Current smoker Smoking cessation counseling will be provided   Other Stroke Risk Factors Current Cigarette smoker, will be advised to stop smoking ETOH use disorder alcohol level <10, advised to drink no more than 2 drink(s) a day   Other Active Problems Multinodular goiter   Hospital day # 17  Patient has been transition to trach collar now for 2 days and is stable to transfer out of the ICU.  We will asked medical hospitalist team to resume his care.  Continue strict blood pressure control systolic goal below 102.  Nephrology following and do not recommend  dialysis at this moment.  Continue ongoing therapies.  We will asked medical hospitalist team to resume care.  Hopefully transfer to the neurological floor bed the next day or 2.  Discussed with Kennieth Rad nurse practitioner critical care medicine.family has agreed to DNR with no escalation of care and no dialysis.   This patient is critically ill and at significant risk of neurological worsening, death and care requires constant monitoring of vital signs, hemodynamics,respiratory and cardiac monitoring, extensive review of multiple databases, frequent neurological assessment, discussion with family, other specialists and medical decision making of high complexity.I have made any additions or clarifications directly to the above note.This critical care time does not reflect procedure time, or teaching time or supervisory time of PA/NP/Med Resident etc but could involve care discussion time.  I spent 30 minutes of neurocritical care time  in the care of  this patient.      Antony Contras, MD Medical Director [place] Pager: [phone] 01/23/2021 12:36 PM  To contact Stroke Continuity provider, please refer to [email or web address] After hours, contact General Neurology

## 2021-01-23 NOTE — Progress Notes (Signed)
Occupational Therapy Treatment Patient Details Name: Timothy Good MRN: [id number] DOB: 21-Jul-1965 Today's Date: 01/23/2021   History of present illness 55 y.o. male presents to [facility] hospital on 01/07/2021, found down during a well check. Pt was emergently intubated and head CT revealed pontine hemorrhage. Intubated 10/7. PEG placed 10/19. PMH includes HTN, CKD stage IV, HLD, alcohol abuse, anemia.   OT comments  Pt making slow progress due to medical procedures including a trach and peg in the last week.  OT now reinitiated and all goals updated. Feel pt will continue to benefit from OT to increase independence with basic adls and transfers. Pt following all commands today. Vision evaluated in the L eye. Pt limited by vision, perceptual deficits, limitations in cognition and L dense hemiparesis. Will cont to focus on adls on the EOB.    Recommendations for follow up therapy are one component of a multi-disciplinary discharge planning process, led by the attending physician.  Recommendations may be updated based on patient status, additional functional criteria and insurance authorization.    Follow Up Recommendations  Skilled nursing-short term rehab (<3 hours/day)    Assistance Recommended at Discharge Frequent or constant Supervision/Assistance  Equipment Recommendations  Other (comment) (tbd)    Recommendations for Other Services      Precautions / Restrictions Precautions Precautions: Fall Precaution Comments: trach and peg placed last week.  Significant bleeding to trach on Friday.  Nursing instructed to leave it as is and ENT will undress it. Restrictions Weight Bearing Restrictions: No       Mobility Bed Mobility Overal bed mobility: Needs Assistance Bed Mobility: Rolling Rolling: Total assist;+2 for physical assistance;+2 for safety/equipment         General bed mobility comments: placed in chair position to increase arousal and participation. Pt groomed in chair  position.    Transfers                   General transfer comment: Did not attempt transfers.     Balance Overall balance assessment: Needs assistance Sitting-balance support: Feet supported (in chair position in bed) Sitting balance-Leahy Scale: Poor Sitting balance - Comments: Pt unable to discern if falling to the Left Postural control: Left lateral lean     Standing balance comment: Pt unable to stand                           ADL either performed or assessed with clinical judgement   ADL Overall ADL's : Needs assistance/impaired Eating/Feeding: NPO   Grooming: Maximal assistance;Wash/dry hands;Oral care;Bed level Grooming Details (indicate cue type and reason): Pt washed face, rubbed lotion on hands and washed hands with max assist.                               General ADL Comments: Pt following more commands and able to participate in some grooming at bed level. Pt ready to attempt EOB.     Vision   Vision Assessment?: Vision impaired- to be further tested in functional context Additional Comments: Pt able to scan R to L although has a R gaze preference. Pt able to put up on his R hand the number of fingers I put up in front of his face.   Perception Perception Perception: Impaired   Praxis Praxis Praxis: Not tested    Cognition Arousal/Alertness: Awake/alert Behavior During Therapy: Flat affect Overall Cognitive Status: Difficult to  assess                                 General Comments: Pt followed 90% of simple commands          Exercises Exercises: Other exercises General Exercises - Upper Extremity Shoulder Flexion: PROM;Both;10 reps;Other (comment) Elbow Flexion: PROM;Left;AAROM;Right;10 reps;Supine Elbow Extension: PROM;Left;AAROM;Right;10 reps;Supine Wrist Flexion: PROM;Left;15 reps Wrist Extension: PROM;Left;15 reps Other Exercises Other Exercises: AROM exercises with RUE Other Exercises: PROM  L UE   Shoulder Instructions       General Comments Pt limited by trach dressing/bleeding, poor cognition, poor vision and perception all affecting ability to complete adls without signficant help.    Pertinent Vitals/ Pain       Pain Assessment: No/denies pain Faces Pain Scale: No hurt Pain Location: Pt gave a thumbs up to "no pain"  Home Living                                          Prior Functioning/Environment              Frequency  Min 2X/week        Progress Toward Goals  OT Goals(current goals can now be found in the care plan section)  Progress towards OT goals: Progressing toward goals  Acute Rehab OT Goals OT Goal Formulation: With patient Time For Goal Achievement: 02/06/21 Potential to Achieve Goals: Fair ADL Goals Pt Will Perform Grooming: with min assist;sitting Pt Will Perform Upper Body Bathing: with min assist;sitting Pt Will Perform Upper Body Dressing: Independently Pt Will Transfer to Toilet: with min assist;squat pivot transfer;stand pivot transfer;bedside commode Pt/caregiver will Perform Home Exercise Program: Increased ROM;Increased strength;Right Upper extremity;With written HEP provided Additional ADL Goal #1: Pt will be Min A to come up to EOB in prep for transfers and basic ADLs Additional ADL Goal #2: Continue to assess vision  Plan Discharge plan remains appropriate    Co-evaluation                 AM-PAC OT "6 Clicks" Daily Activity     Outcome Measure   Help from another person eating meals?: Total Help from another person taking care of personal grooming?: Total Help from another person toileting, which includes using toliet, bedpan, or urinal?: Total Help from another person bathing (including washing, rinsing, drying)?: Total Help from another person to put on and taking off regular upper body clothing?: Total Help from another person to put on and taking off regular lower body clothing?:  Total 6 Click Score: 6    End of Session    OT Visit Diagnosis: Muscle weakness (generalized) (M62.81);Other abnormalities of gait and mobility (R26.89);Low vision, both eyes (H54.2);Other symptoms and signs involving cognitive function;Hemiplegia and hemiparesis Hemiplegia - Right/Left: Left Hemiplegia - dominant/non-dominant: Dominant   Activity Tolerance Patient tolerated treatment well   Patient Left in bed;with call bell/phone within reach;with bed alarm set;with SCD's reapplied;Other (comment) (educated pt on use of  call bell for tv and nursing needs. Pt demonstrated back.)   Nurse Communication Mobility status;Precautions        Time: 5397-6734 OT Time Calculation (min): 24 min  Charges: OT General Charges $OT Visit: 1 Visit OT Treatments $Self Care/Home Management : 23-37 mins    Glenford Peers 01/23/2021, 12:30 PM

## 2021-01-23 NOTE — Progress Notes (Signed)
Daily Progress Note   Patient Name: Timothy Good       Date: 01/23/2021 DOB: [birth year]  Age: 55 y.o. MRN#: [id number] Attending Physician: Stroke, Md, MD Primary Care Physician: Raiford Simmonds., PA-C Admit Date: 01/21/2021  Reason for Consultation/Follow-up: Establishing goals of care and Psychosocial/spiritual support   Today's discussion   Spoke to daughter [name] by phone and updated her on patient's current medical situation.  She and her siblings are comfortable with the difficult decision they made for their father in not excalating care, focusing on comfort and taking it one day at a time.   Current medical interventions are  continued.  Details regarding the difference between an aggressive medical intervention path and a palliative comfort path for this patient at this time in this situation were again outlined.      Education offered on patient's limited poor prognosis and natural trajectory and expectations at end-of-life.  Education offered on hospice benefit, hospice philosophy and residential hospice eligibility.  Explored family's interest in residential hospice.  I offered to set up a conference call for herself  and her siblings.  She tells me she will get back to me regarding interest and time.   I await callback.    Length of Stay: 17  Current Medications: Scheduled Meds:  . vitamin C  2,000 mg Per Tube Daily  . chlorhexidine gluconate (MEDLINE KIT)  15 mL Mouth Rinse BID  . Chlorhexidine Gluconate Cloth  6 each Topical Daily  . ciprofloxacin  2 drop Right Eye Q4H while awake  . doxycycline  100 mg Per Tube Q12H  . erythromycin   Right Eye Q6H  . feeding supplement (PROSource TF)  45 mL Per Tube TID  . free water  100 mL Per Tube Q6H  . heparin  injection (subcutaneous)  5,000 Units Subcutaneous Q8H  . insulin aspart  0-6 Units Subcutaneous Q4H  . mouth rinse  15 mL Mouth Rinse 10 times per day  . mupirocin ointment  1 application Nasal BID  . pantoprazole sodium  40 mg Per Tube Daily  . sodium bicarbonate  650 mg Per Tube TID  . thiamine  100 mg Per Tube Daily  . [START ON 01/24/2021] torsemide  20 mg Per Tube Daily    Continuous Infusions: . sodium chloride Stopped (01/14/21 1454)  .  feeding supplement (NEPRO CARB STEADY) 45 mL/hr at 01/23/21 0600    PRN Meds: sodium chloride, acetaminophen **OR** acetaminophen (TYLENOL) oral liquid 160 mg/5 mL **OR** acetaminophen, fentaNYL (SUBLIMAZE) injection, hydrALAZINE, labetalol, polyvinyl alcohol  Physical Exam          Vital Signs: BP 124/82   Pulse 87   Temp 97.9 F (36.6 C) (Axillary)   Resp (!) 23   Ht $R'6\' 2"'kT$  (1.88 m)   Wt 75.4 kg   SpO2 100%   BMI 21.34 kg/m  SpO2: SpO2: 100 % O2 Device: O2 Device: Tracheostomy Collar O2 Flow Rate: O2 Flow Rate (L/min): 8 L/min  Intake/output summary:  Intake/Output Summary (Last 24 hours) at 01/23/2021 1219 Last data filed at 01/23/2021 1200 Gross per 24 hour  Intake 1680 ml  Output 1700 ml  Net -20 ml    LBM: Last BM Date: 01/22/21 Baseline Weight: Weight: 101 kg Most recent weight: Weight: 75.4 kg       Palliative Assessment/Data:  20 % at best      Patient Active Problem List   Diagnosis Date Noted  . Endotracheally intubated   . Acute respiratory failure with hypoxia ([place])   . Hypertension   . Malnutrition of moderate degree 01/10/2021  . 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 Protein in urine   . Hypertensive crisis 01/23/2012  . Acute on chronic combined systolic and diastolic heart failure ([place]) 01/23/2012  . Acute on chronic renal failure ([place]) 01/23/2012  . Noncompliance with medication regimen 01/23/2012    Palliative Care Assessment & Plan   Patient Profile: 55 year old man with past medical  history significant for chronic combined systolic and diastolic congestive heart failure, hypertension that has been poorly controlled with nonadherence and underlying chronic kidney disease stage III-IV for which he is followed by Dr. Theador Hawthorne in [place].    55 y/o male with multiple medical problems was found down by in a well call visit by police and was brought to [facility] with a diagnosis of pontine hemorrhage.  He was intubated for airway protection and then transferred to [facility] for further evaluation.      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  CT head right pontine hemorrhage with midbrain and medulla extension, IVH at fourth ventricle MRI  Acute brainstem hemorrhage centered at the pons. Extensive pontine edema, which tracks associated edema which tracks into the midbrain right > left and into the right medullary pyramid. Mild extension of hemorrhage into the 4th ventricle and basilar subarachnoid spaces. But no ventriculomegaly. No midline shift or impending herniation. Negative noncontrast MRI appearance of the orbits. CT repeat stable hematoma, no hydrocephalus   Currently patient is status post trach and PEG.  Today is day 17 of this hospitalization.   He has successfully been weaned off the ventilator.  He follows intermittent simple commands.      Recommendations/Plan: -  DNR/DNI -  No dialysis, now or in the future -  Continue current medical interventions however do not escalate care -   Decisions dependent on patient outcomes -  Continue discussion regarding transition of care, possibility of residential hospice discussed.  Palliative medicine will continue to support holistically.  I will reevaluate patient in the morning and update family.     Code Status:    Code Status Orders  (From admission, onward)           Start     Ordered  01/21/21 1804  Do not attempt  resuscitation (DNR)  Continuous       Question Answer Comment  In the event of cardiac or respiratory ARREST Do not call a "code blue"   In the event of cardiac or respiratory ARREST Do not perform Intubation, CPR, defibrillation or ACLS   In the event of cardiac or respiratory ARREST Use medication by any route, position, wound care, and other measures to relive pain and suffering. May use oxygen, suction and manual treatment of airway obstruction as needed for comfort.      01/21/21 1803           Code Status History     Date Active Date Inactive Code Status Order ID Comments User Context   01/24/2021 1707 01/21/2021 1803 Full Code [id number]  Lorenza Chick, MD Inpatient       Prognosis:  < 4 weeks  Discharge Planning: To Be Determined  Care plan was discussed with Dr Karsten Fells and Dr Leonie Man and bedside RN  Thank you for allowing the Palliative Medicine Team to assist in the care of this patient.   Time In: 1200 Time Out: 1245 Total Time 45 minutes Prolonged Time Billed  no       Greater than 50%  of this time was spent counseling and coordinating care related to the above assessment and plan.  Wadie Lessen, NP  Please contact Palliative Medicine Team phone at [phone] for questions and concerns.

## 2021-01-23 NOTE — Progress Notes (Signed)
[facility] NEPHROLOGY PROGRESS NOTE  Assessment/ Plan:  # AKI on CKD3: Underlying chronic kidney disease from hypertension (followed by Dr. Theador Hawthorne in [place]) and his acute kidney injury appears to be from ischemic ATN.   Dr. Joelyn Oms discussed with daughter [name] and her mother over the phone patient's current status and renal issues.  Reviewed that he has severe renal failure.  Reviewed difficulty of trach/dialysis care once leaving the hospital including, prolonged stays in healthcare facilities and that potential facilities are [place] of state.  Elicited them to speak on patient's behalf and they felt that patient would not desire this type of care based upon previous conversations they have had with him.  His recommendation to them was that we not initiate dialysis but continue other supportive measures.  Palliative care also has discussed with family -  Kendrick Fries and [name] were in agreement not to start dialysis now or ever.  Is DNR  # Hypertension: Blood pressures currently well controlled on a combination of amlodipine, carvedilol and hydralazine-  since has been simplified to hydralazine only-  BP actually might be a little low.  Continue to hold ACE inhibitor .  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  # Acute hypoxic respiratory failure/VDRF: managed by pulmonary team.  S/p Trach 10/21  # Anemia, transfuse prn  #Hyperkalemia, mild: daily Lokelma.  Down to 4.3-  will hold lokelma and follow   Subjective:  SCr stable 6.2; BUN up to 179.   K is 4.3 1.7 L UOP Palliative care has confirmed the decision for no dialysis    Objective Vital signs in last 24 hours: Vitals:   01/23/21 0800 01/23/21 0817 01/23/21 0900 01/23/21 1000  BP: 106/67 106/67 104/67 110/75  Pulse: 78 80 82 83  Resp: (!) 27 (!) 25 (!) 22 (!)  22  Temp: 97.8 F (36.6 C)     TempSrc: Axillary     SpO2: 94% 95% 96% 95%  Weight:      Height:       Weight change: -0.6 kg  Intake/Output Summary (Last 24 hours) at 01/23/2021 1026 Last data filed at 01/23/2021 1000 Gross per 24 hour  Intake 1970 ml  Output 1775 ml  Net 195 ml       Labs: Basic Metabolic Panel: Recent Labs  Lab 01/28/2021 0354 01/18/21 0431 01/19/21 0346 01/15/2021 0332 01/21/21 0356 01/22/21 0140 01/23/21 0309  NA 142 136   < > 140 139 139 139  K 5.4* 5.0   < > 4.9 5.3* 4.8 4.3  CL 108 103   < > 103 101 99 96*  CO2 21* 19*   < > 21* $Rem'22 22 24  'vutc$ GLUCOSE 90 117*   < > 94 107* 108* 96  BUN 102* 122*   < > 156* 166* 172* 179*  CREATININE 5.60* 5.53*   < > 6.47* 6.30* 6.23* 6.24*  CALCIUM 10.3 9.8   < > 9.9 10.1 9.8 9.8  PHOS 5.3* 7.2*  --  8.2*  --   --   --    < > = values in this interval not displayed.   Liver Function Tests: Recent Labs  Lab 01/18/2021 0354 01/18/21 0431 01/17/2021 0332  ALBUMIN 1.7* 1.9* 1.9*   No results for input(s): LIPASE, AMYLASE in the last 168 hours.  No results for input(s): AMMONIA in  the last 168 hours. CBC: Recent Labs  Lab 01/18/21 0431 01/19/21 0346 01/21/21 0456 01/22/21 0140 01/23/21 0309  WBC 11.8* 9.2 12.1* 11.8* 11.9*  NEUTROABS  --  6.0  --   --   --   HGB 8.1* 7.6* 7.6* 7.3* 7.2*  HCT 25.4* 23.9* 23.9* 23.0* 23.2*  MCV 89.1 89.5 89.2 88.5 90.3  PLT 334 358 397 390 423*   Cardiac Enzymes: No results for input(s): CKTOTAL, CKMB, CKMBINDEX, TROPONINI in the last 168 hours.  CBG: Recent Labs  Lab 01/22/21 1546 01/22/21 2002 01/22/21 2327 01/23/21 0352 01/23/21 0807  GLUCAP 94 99 108* 94 109*    Iron Studies: No results for input(s): IRON, TIBC, TRANSFERRIN, FERRITIN in the last 72 hours. Studies/Results: No results found.  Medications: Infusions:  sodium chloride Stopped (01/14/21 1454)   feeding supplement (NEPRO CARB STEADY) 45 mL/hr at 01/23/21 0600    Scheduled Medications:   vitamin C  2,000 mg Per Tube Daily   chlorhexidine gluconate (MEDLINE KIT)  15 mL Mouth Rinse BID   Chlorhexidine Gluconate Cloth  6 each Topical Daily   ciprofloxacin  2 drop Right Eye Q4H while awake   doxycycline  100 mg Per Tube Q12H   erythromycin   Right Eye Q6H   feeding supplement (PROSource TF)  45 mL Per Tube TID   free water  100 mL Per Tube Q6H   heparin injection (subcutaneous)  5,000 Units Subcutaneous Q8H   insulin aspart  0-6 Units Subcutaneous Q4H   mouth rinse  15 mL Mouth Rinse 10 times per day   mupirocin ointment  1 application Nasal BID   pantoprazole sodium  40 mg Per Tube Daily   sodium bicarbonate  650 mg Per Tube TID   sodium zirconium cyclosilicate  10 g Per Tube Daily   thiamine  100 mg Per Tube Daily   torsemide  40 mg Per Tube Daily    have reviewed scheduled and prn medications.  Physical Exam: General: Ill-looking male on Trach-  seems to be looking at me and tracking Heart:RRR, s1s2 nl Lungs: Coarse breath sound bilateral. Abdomen:soft, Non-tender, non-distended Extremities:No edema Neurology: unchanged,  FC at times   Rigdon Macomber A Olamide Carattini 01/23/2021,10:26 AM  LOS: 17 days

## 2021-01-24 DIAGNOSIS — J9601 Acute respiratory failure with hypoxia: Secondary | ICD-10-CM | POA: Diagnosis not present

## 2021-01-24 DIAGNOSIS — I613 Nontraumatic intracerebral hemorrhage in brain stem: Secondary | ICD-10-CM | POA: Diagnosis not present

## 2021-01-24 LAB — CBC
HCT: 23 % — ABNORMAL LOW (ref 39.0–52.0)
Hemoglobin: 7.2 g/dL — ABNORMAL LOW (ref 13.0–17.0)
MCH: 28.3 pg (ref 26.0–34.0)
MCHC: 31.3 g/dL (ref 30.0–36.0)
MCV: 90.6 fL (ref 80.0–100.0)
Platelets: 466 10*3/uL — ABNORMAL HIGH (ref 150–400)
RBC: 2.54 MIL/uL — ABNORMAL LOW (ref 4.22–5.81)
RDW: 16.8 % — ABNORMAL HIGH (ref 11.5–15.5)
WBC: 11.5 10*3/uL — ABNORMAL HIGH (ref 4.0–10.5)
nRBC: 0 % (ref 0.0–0.2)

## 2021-01-24 LAB — BASIC METABOLIC PANEL
Anion gap: 16 — ABNORMAL HIGH (ref 5–15)
BUN: 187 mg/dL — ABNORMAL HIGH (ref 6–20)
CO2: 26 mmol/L (ref 22–32)
Calcium: 10 mg/dL (ref 8.9–10.3)
Chloride: 99 mmol/L (ref 98–111)
Creatinine, Ser: 6.22 mg/dL — ABNORMAL HIGH (ref 0.61–1.24)
GFR, Estimated: 10 mL/min — ABNORMAL LOW (ref >60)
Glucose, Bld: 104 mg/dL — ABNORMAL HIGH (ref 70–99)
Potassium: 3.9 mmol/L (ref 3.5–5.1)
Sodium: 141 mmol/L (ref 135–145)

## 2021-01-24 LAB — GLUCOSE, CAPILLARY
Glucose-Capillary: 102 mg/dL — ABNORMAL HIGH (ref 70–99)
Glucose-Capillary: 112 mg/dL — ABNORMAL HIGH (ref 70–99)
Glucose-Capillary: 113 mg/dL — ABNORMAL HIGH (ref 70–99)
Glucose-Capillary: 113 mg/dL — ABNORMAL HIGH (ref 70–99)
Glucose-Capillary: 96 mg/dL (ref 70–99)
Glucose-Capillary: 97 mg/dL (ref 70–99)

## 2021-01-24 MED ORDER — SODIUM BICARBONATE 650 MG PO TABS
650.0000 mg | ORAL_TABLET | Freq: Two times a day (BID) | ORAL | Status: DC
  Administered 2021-01-24 – 2021-01-28 (×8): 650 mg
  Filled 2021-01-24 (×8): qty 1

## 2021-01-24 NOTE — Progress Notes (Signed)
STROKE TEAM PROGRESS NOTE   INTERVAL HISTORY No new changes overnight. No fevers. No concerning events reported by nursing staff.  Vitals:   01/24/21 0600 01/24/21 0700 01/24/21 0800 01/24/21 0900  BP: 122/78 108/73 111/70   Pulse: 85 81 80 81  Resp: (!) 26 (!) 23 (!) 21 (!) 21  Temp:   97.7 F (36.5 C)   TempSrc:   Axillary   SpO2: 95% 96% 93% 96%  Weight:      Height:       CBC:  Recent Labs  Lab 01/19/21 0346 01/21/21 0456 01/23/21 0309 01/24/21 0317  WBC 9.2   < > 11.9* 11.5*  NEUTROABS 6.0  --   --   --   HGB 7.6*   < > 7.2* 7.2*  HCT 23.9*   < > 23.2* 23.0*  MCV 89.5   < > 90.3 90.6  PLT 358   < > 423* 466*   < > = values in this interval not displayed.   Basic Metabolic Panel:  Recent Labs  Lab 01/18/21 0431 01/19/21 0346 01/09/2021 0332 01/21/21 0356 01/23/21 0309 01/24/21 0317  NA 136   < > 140   < > 139 141  K 5.0   < > 4.9   < > 4.3 3.9  CL 103   < > 103   < > 96* 99  CO2 19*   < > 21*   < > 24 26  GLUCOSE 117*   < > 94   < > 96 104*  BUN 122*   < > 156*   < > 179* 187*  CREATININE 5.53*   < > 6.47*   < > 6.24* 6.22*  CALCIUM 9.8   < > 9.9   < > 9.8 10.0  PHOS 7.2*  --  8.2*  --   --   --    < > = values in this interval not displayed.    IMAGING past 24 hours No results found.  PHYSICAL EXAM  General - Well nourished, well developed middle-aged African-American male status post tracheostomy on trach collar, in no apparent distress.   Ophthalmologic - fundi not visualized due to noncooperation.   Cardiovascular - Regular rhythm and rate.   Mental Status -  Patient is status post tracheostomy.  On trach collar.   Awake and drowsy. Will Follow some commands. Right eye sutured shut.  Left eye saccadic dysmetria and nystagmus on left gaze.   Cranial Nerves II - XII - nystagmus noted.    Motor Strength - left side flaccid. Antigravity purposeful movements on right. Moved right UE and gripped to command. Did not move right foot for me.    Coordination - The patient had normal movements in the hands and feet with no ataxia or dysmetria.  Tremor was absent.   Gait and Station - deferred.   Temp:  [97.6 F (36.4 C)-98 F (36.7 C)] 97.7 F (36.5 C) (10/25 0800) Pulse Rate:  [78-90] 81 (10/25 0900) Resp:  [16-30] 21 (10/25 0900) BP: (108-135)/(70-86) 111/70 (10/25 0800) SpO2:  [91 %-100 %] 96 % (10/25 0900) FiO2 (%):  [35 %] 35 % (10/25 0800)   ASSESSMENT/PLAN EDMON MAGID is a 55 y.o. male with a past medical history significant for uncontrolled hypertension, CKD stage IV, heart failure, hyperlipidemia, daily drinking, medication nonadherence, iron deficiency anemia/anemia of chronic disease, presenting with a pontine hemorrhage. ICH Score: 3.     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  CT head right pontine hemorrhage with midbrain and medulla extension, IVH at fourth ventricle MRI  Acute brainstem hemorrhage centered at the pons. Extensive pontine edema, which tracks associated edema which tracks into the midbrain right > left and into the right medullary pyramid. Mild extension of hemorrhage into the 4th ventricle and basilar subarachnoid spaces. But no ventriculomegaly. No midline shift or impending herniation. Negative noncontrast MRI appearance of the orbits. CT repeat stable hematoma, no hydrocephalus Consider MRA to rule out AVM or aneurysm once hematoma resolves. 2D Echo EF 55 to 60%.  LDL 75 HgbA1c 5.4 VTE prophylaxis - Heparin subcu Not on Anticoagulant or antiplatelet prior to admission, now on no antithrombotics due to [place] Therapy recommendations:  CIR->most likely SNF vs residential hospice (pending family decision) Palliatve care met w/ family - poor prognosis, not a HD candidate now or in the future. Made DNR. Do not escalate care. Discussed comfort care. Family considering; palliative following Disposition: Pending  Ok for transfer to  the floor  Cerebral Edema, stable Neurosurgery consulted, no acute intervention  Treated w/ 3% saline Sodium trending down with daily check   Na 141   Acute respiratory failure Probable aspiration pneumonia Extubated First on Unasyn->Rocephin->off Afebrile  Resp culture: few gram positive cocci PEG 10/18 with Trauma Trach 10/21 with ENT  on trach collar     Hypertensive emergency Home meds: none Stable now BP goal less than 160  Currently on Norvasc 10, Coreg 25, hydralazine 50 Q8 Long-term BP goal normotensive   Hyperlipidemia Home meds:  None LDL  75, almost at goal < 70 May consider statin at discharge   AKI on CKD Ischemic ATN Anemia  Hyperkalemia  Creatinine 3.65-3.80-5.2-6.27-5.69-5.46-5.44-5.60-5.53->5.90->6.3->6.23->6.24->6.22 Hb 8.2->7.6->7.7->7.3->7.1->6.6->PRBC->8.1->7.6-.7.6->7.3->7.2->7.2 K 4.8 On tube feeding and free water  Urine adequate Nephrology on board Not an acute or long-term HD candidate    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 Eulas Post Cipro eyedrop during the day and erythromycin ointment nightly Continue Vit C 2g and docxy 100 bid Appreciate ophthal Dr. Roda Shutters help   ETOH use disorder with dependence Daily use of excessive liquor per family (?1/2 bottle vodka)  Thiamine, folate and MVI on board Vitamin B12 -213 No s/s of withdrawal  Dysphagia Continue tube feeding and free water  Speech on board CT Chest Abd Pelvis: generalized gastric wall thickening, with associated fat stranding in the gastrohepatic ligament.Findings are nonspecific and could be due to noninflammatory edema,gastritis, peptic ulcer disease or neoplasm.  PEG done 10/18   Tobacco abuse Current smoker Smoking cessation counseling provided   Other Stroke Risk Factors ETOH use disorder alcohol level  <10, advised to drink no more than 2 drink(s) a day   Other Active Problems Multinodular goiter   Hospital day # 18      This patient is critically ill due to respiratory distress, stroke and at significant risk of neurological worsening, death form heart failure, respiratory failure, recurrent stroke, bleeding from [facility], seizure, sepsis. This patient's care requires constant monitoring of vital signs, hemodynamics, respiratory and cardiac monitoring, review of multiple databases, neurological assessment, discussion with family, other specialists and medical decision making of high complexity. I spent 35 minutes of neurocritical care time in the care of this patient.   Timothy Vandrunen,MD   To contact Stroke Continuity provider, please refer to [email or web address] After hours, contact General  Neurology

## 2021-01-24 NOTE — Progress Notes (Signed)
Subjective Resting in bed   EXAMINATION   VAsc unable due to limited responsiveness   Pupils:  OD: no view due to tarsorhaphy OS: Miotic, Equal, round, reactive, no APD      Anterior segment penlight Exam: (view limited OD due to tarsorhaphy) Ext/Lids: floppy eyelids OU, R palpebral fissure closed centrally, tarsorhaphy in place Conj/Sclera: OD 1+ chemosis mostly temporal, OS white and quiet  Cornea: OD central/inferior corneal haze with improved epithelial defect (somewhat visible with medial distraction of floppy eyelids. 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 Ophthalmology [facility]

## 2021-01-24 NOTE — Progress Notes (Signed)
PROGRESS NOTE    Timothy Good  JSE:[id number] DOB: 1965-09-29 DOA: 01/14/2021 PCP: Raiford Simmonds., PA-C    Brief Narrative:  55 year old gentleman with multiple medical issues who was found down at home and found by police on a welfare visit.  Has history of uncontrolled hypertension, CKD stage IV, heart failure, hyperlipidemia, alcohol use, medication nonadherence, iron-deficiency anemia.  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 10/7, admitted to ICU and intubated.  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 10/21, tracheostomy and liberation from the ventilator.  PEG tube insertion. 10/22, seen by palliative changed to DNR.  DNR with no escalation of care and no dialysis. 10/25, transferred to [facility] care.  Remains on very poor clinical status.   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 Continue to work with physical therapy and Occupational Therapy.  Acute hypoxemic respiratory failure secondary to pontine hemorrhage.  Ventilator dependent and now status post tracheostomy on trach collar: Currently remains off ventilator for last 3 nights, able to maintain on trach collar. He does have some bleeding around the tracheostomy, old clotted blood.  ENT to  follow. Continue aggressive pulmonary therapy/breathing exercises and respiratory care.  Family decided not to go back on ventilator.  Acute kidney injury on chronic kidney disease stage IV, ischemic ATN: Decided no dialysis now or in the future as per palliative care discussion. Followed by nephrology. Currently on free water flush through the tube. BUN and creatinine remains elevated, potassium is stable.  Urine output is adequate.  Hypertensive emergency: Blood pressures are low normal now.  All his antihypertensives are on hold.  He is on torsemide.  He is on as needed hydralazine and labetalol with goal of systolic blood pressure less than 160.  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 Eulas Post.  Cipro eyedrops and erythromycin eyedrops.  On oral doxycycline.  Dysphagia: On PEG tube feeding.  Goal of care: Palliative closely following up with family.  Patient currently stabilized but remains in very poor clinical status.  If he continues to not do well and renal functions do not recover, he may even qualify for hospice level of care.   DVT prophylaxis: heparin injection 5,000 Units Start: 01/15/21 0100 SCD's Start: 01/03/2021 1701   Code Status: DNR Family Communication: None at the bedside Disposition Plan: Status is: Inpatient  Remains inpatient appropriate because: Unsafe discharge plan         Consultants:  ENT Critical care Palliative care  Procedures:  Right lead tarsorrhaphy Tracheostomy PEG tube placement  Antimicrobials:  Ciprofloxacin eyedrops.  Doxycycline by mouth.   Subjective: Patient seen and examined.  Unable to interact.  He followed simple commands and used his right hand to make a grip.  Unable to express.  Nursing has no concerns.  Tolerating tube feedings.  Urine output is recorded 1700 mL last 24 hours.  Objective: Vitals:   01/24/21 0700 01/24/21 0800 01/24/21 0900  01/24/21 1000  BP: 108/73 111/70    Pulse: 81 80 81 80  Resp: (!) 23 (!) 21 (!) 21 (!) 21  Temp:  97.7 F (36.5 C)    TempSrc:  Axillary    SpO2: 96% 93% 96% 96%  Weight:      Height:        Intake/Output Summary (Last 24 hours) at 01/24/2021 1058 Last data filed at 01/24/2021 1000 Gross per 24 hour  Intake 1660 ml  Output 1700 ml  Net -40 ml   Filed Weights   01/21/21 0500 01/22/21 0147 01/23/21 0405  Weight: 73.8 kg 76 kg 75.4 kg    Examination:  General exam: Sick looking.  Fairly comfortable today. Right eye lead are sutured together. He has tracheostomy with plenty of clotted blood and packing done. He is alert and awake on his stimulation, follows commands on the right side.  Left side is flaccid. Respiratory system: Conducted airway sounds. SpO2: 97 % O2 Flow Rate (L/min): 8 L/min FiO2 (%): 35 %  Cardiovascular system: S1 & S2 heard, RRR.  Gastrointestinal system: Soft.  Nontender.  Bowel sounds present.  PEG tube in place.    Data Reviewed: I have personally reviewed following labs and imaging studies  CBC: Recent Labs  Lab 01/19/21 0346 01/21/21 0456 01/22/21 0140 01/23/21 0309 01/24/21 0317  WBC 9.2 12.1* 11.8* 11.9* 11.5*  NEUTROABS 6.0  --   --   --   --   HGB 7.6* 7.6* 7.3* 7.2* 7.2*  HCT 23.9* 23.9* 23.0* 23.2* 23.0*  MCV 89.5 89.2 88.5 90.3 90.6  PLT 358 397 390 423* 327*   Basic Metabolic Panel: Recent Labs  Lab 01/18/21 0431 01/19/21 0346 01/06/2021 0332 01/21/21 0356 01/22/21 0140 01/23/21 0309 01/24/21 0317  NA 136   < > 140 139 139 139 141  K 5.0   < > 4.9 5.3* 4.8 4.3 3.9  CL 103   < > 103 101 99 96* 99  CO2 19*   < > 21* $Rem'22 22 24 26  'KdIZ$ GLUCOSE 117*   < > 94 107* 108* 96 104*  BUN 122*   < > 156* 166* 172* 179* 187*  CREATININE 5.53*   < > 6.47* 6.30* 6.23* 6.24* 6.22*  CALCIUM 9.8   < > 9.9 10.1 9.8 9.8 10.0  PHOS 7.2*  --  8.2*  --   --   --   --    < > = values in this interval not displayed.   GFR: Estimated Creatinine  Clearance: 14.6 mL/min (A) (by C-G formula based on SCr of 6.22 mg/dL (H)). Liver Function Tests: Recent Labs  Lab 01/18/21 0431 01/07/2021 0332  ALBUMIN 1.9* 1.9*   No results for input(s): LIPASE, AMYLASE in the last 168 hours. No results for input(s): AMMONIA in the last 168 hours. Coagulation Profile: Recent Labs  Lab 01/04/2021 0332  INR 1.2   Cardiac Enzymes: No results for input(s): CKTOTAL, CKMB, CKMBINDEX, TROPONINI in the last 168 hours. BNP (last 3 results) No results for input(s): PROBNP in the last 8760 hours. HbA1C: No results for input(s): HGBA1C in the last 72 hours. CBG: Recent Labs  Lab 01/23/21 1544 01/23/21 2009 01/23/21 2343 01/24/21 0334 01/24/21 0805  GLUCAP 95 108* 119* 96 113*   Lipid Profile: No results for input(s): CHOL, HDL, LDLCALC, TRIG, CHOLHDL, LDLDIRECT in the last  72 hours. Thyroid Function Tests: No results for input(s): TSH, T4TOTAL, FREET4, T3FREE, THYROIDAB in the last 72 hours. Anemia Panel: No results for input(s): VITAMINB12, FOLATE, FERRITIN, TIBC, IRON, RETICCTPCT in the last 72 hours. Sepsis Labs: No results for input(s): PROCALCITON, LATICACIDVEN in the last 168 hours.  Recent Results (from the past 240 hour(s))  Surgical PCR screen     Status: None   Collection Time: 01/29/2021  4:54 AM   Specimen: Nasal Mucosa; Nasal Swab  Result Value Ref Range Status   MRSA, PCR NEGATIVE NEGATIVE Final   Staphylococcus aureus NEGATIVE NEGATIVE Final    Comment: (NOTE) The Xpert SA Assay (FDA approved for NASAL specimens in patients 57 years of age and older), is one component of a comprehensive surveillance program. It is not intended to diagnose infection nor to guide or monitor treatment. Performed at [facility], [place] [address] [place], [place] [address]          Radiology Studies: No results found.      Scheduled Meds:  vitamin C  2,000 mg Per Tube Daily   chlorhexidine gluconate (MEDLINE KIT)  15 mL Mouth  Rinse BID   Chlorhexidine Gluconate Cloth  6 each Topical Daily   ciprofloxacin  2 drop Right Eye Q4H while awake   doxycycline  100 mg Per Tube Q12H   erythromycin   Right Eye Q6H   feeding supplement (PROSource TF)  45 mL Per Tube TID   free water  100 mL Per Tube Q6H   heparin injection (subcutaneous)  5,000 Units Subcutaneous Q8H   insulin aspart  0-6 Units Subcutaneous Q4H   mouth rinse  15 mL Mouth Rinse 10 times per day   mupirocin ointment  1 application Nasal BID   pantoprazole sodium  40 mg Per Tube Daily   sodium bicarbonate  650 mg Per Tube TID   thiamine  100 mg Per Tube Daily   torsemide  20 mg Per Tube Daily   Continuous Infusions:  sodium chloride Stopped (01/14/21 1454)   feeding supplement (NEPRO CARB STEADY) 45 mL/hr at 01/23/21 0600     LOS: 18 days    Time spent: 35 minutes    Barb Merino, MD [facility] Pager [phone]

## 2021-01-24 NOTE — Progress Notes (Signed)
Physical Therapy Treatment Patient Details Name: Timothy Good MRN: [id number] DOB: 1965-10-10 Today's Date: 01/24/2021   History of Present Illness 55 y.o. male presents to [facility] hospital on 01/16/2021, found down during a well check. Pt was emergently intubated and head CT revealed pontine hemorrhage. Intubated 10/7. 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 PMH includes HTN, CKD stage IV, HLD, alcohol abuse, anemia.    PT Comments    Pt was able to tolerate lift OOB to chair today, still worked up some secretions with EOB mobility despite RN deep suctioning before our session (had to be suctioned after as well).   Pt still following commands, less as he fatigued and continues to require max assist for mobility, although attempting to help as much as he can on his right side.  PT will continue to follow acutely for safe mobility progression.  Recommendations for follow up therapy are one component of a multi-disciplinary discharge planning process, led by the attending physician.  Recommendations may be updated based on patient status, additional functional criteria and insurance authorization.  Follow Up Recommendations  Acute inpatient rehab (3hours/day)     Assistance Recommended at Discharge Frequent or constant Supervision/Assistance  Equipment Recommendations  Wheelchair cushion (measurements PT);Wheelchair (measurements PT);Hospital bed (hoyer lift)    Recommendations for Other Services Rehab consult     Precautions / Restrictions Precautions Precautions: Fall Precaution Comments: trach, PEG, L hemi     Mobility  Bed Mobility Overal bed mobility: Needs Assistance Bed Mobility: Rolling;Sidelying to Sit;Sit to Supine Rolling: Max assist;+2 for physical assistance Sidelying to sit: Max assist;+2 for physical assistance   Sit to  supine: Max assist;+2 for physical assistance   General bed mobility comments: Pt reaching moving R leg and arm, assist needed to progress L leg and L arm as well as trunk up to sitting EOB.  Assist of left leg (pt lifting right leg) and trunk to return to supine.    Transfers Overall transfer level: Needs assistance                 General transfer comment: Maxi sky used for OOB to the recliner chair. Transfer via Lift Equipment: Maxisky      Modified Rankin (Stroke Patients Only) Modified Rankin (Stroke Patients Only) Pre-Morbid Rankin Score: No significant disability Modified Rankin: Severe disability     Balance Overall balance assessment: Needs assistance Sitting-balance support: Feet supported;Bilateral upper extremity supported Sitting balance-Leahy Scale: Poor Sitting balance - Comments: mod to max assist EOB to maintain sitting balance, post L lateral lean Postural control: Posterior lean;Left lateral lean     Standing balance comment: Pt not ready to attempt standing yet                            Cognition Arousal/Alertness: Awake/alert Behavior During Therapy: Flat affect Overall Cognitive Status: Impaired/Different from baseline Area of Impairment: Following commands;Problem solving                       Following Commands: Follows one step commands inconsistently;Follows one step commands with increased time     Problem Solving: Slow processing;Difficulty sequencing;Requires verbal cues;Requires tactile cues General Comments: Pt continuing to follow >75% of basic commands, this decreases with fatigue.        Exercises General Exercises -  Lower Extremity Long Arc Quad: AROM;Right;10 reps (needed repeated cues to sustain attention to task.)    General Comments        Pertinent Vitals/Pain Pain Assessment: Faces Faces Pain Scale: No hurt Facial Expression: Relaxed, neutral Body Movements: Absence of movements Muscle  Tension: Relaxed Compliance with ventilator (intubated pts.): N/A Vocalization (extubated pts.): Talking in normal tone or no sound CPOT Total: 0    Home Living                          Prior Function            PT Goals (current goals can now be found in the care plan section) Acute Rehab PT Goals Patient Stated Goal: unable to state Progress towards PT goals: Progressing toward goals    Frequency    Min 4X/week      PT Plan Current plan remains appropriate    Co-evaluation              AM-PAC PT "6 Clicks" Mobility   Outcome Measure  Help needed turning from your back to your side while in a flat bed without using bedrails?: Total Help needed moving from lying on your back to sitting on the side of a flat bed without using bedrails?: Total Help needed moving to and from a bed to a chair (including a wheelchair)?: Total Help needed standing up from a chair using your arms (e.g., wheelchair or bedside chair)?: Total Help needed to walk in hospital room?: Total Help needed climbing 3-5 steps with a railing? : Total 6 Click Score: 6    End of Session Equipment Utilized During Treatment: Oxygen Activity Tolerance: Patient limited by fatigue Patient left: in chair;with call bell/phone within reach;with chair alarm set Nurse Communication: Mobility status;Other (comment) (asked for deep suctioning post treatment as pt sounded junky.) PT Visit Diagnosis: Other abnormalities of gait and mobility (R26.89);Muscle weakness (generalized) (M62.81);Other symptoms and signs involving the nervous system (C16.606)     Time: 3016-0109 PT Time Calculation (min) (ACUTE ONLY): 40 min  Charges:  $Therapeutic Activity: 38-52 mins                    Verdene Lennert, PT, DPT  Acute Rehabilitation Ortho Tech Supervisor [phone] pager [phone]) [phone] office

## 2021-01-24 NOTE — Progress Notes (Signed)
Subjective:  Does not seem like significant change - 1700 of UOP -  torsemide taken down yesterday - crt pretty stable but BUN high   Objective Vital signs in last 24 hours: Vitals:   01/24/21 0700 01/24/21 0800 01/24/21 0900 01/24/21 1000  BP: 108/73 111/70    Pulse: 81 80 81 80  Resp: (!) 23 (!) 21 (!) 21 (!) 21  Temp:  97.7 F (36.5 C)    TempSrc:  Axillary    SpO2: 96% 93% 96% 96%  Weight:      Height:       Weight change:   Intake/Output Summary (Last 24 hours) at 01/24/2021 1056 Last data filed at 01/24/2021 1000 Gross per 24 hour  Intake 1660 ml  Output 1700 ml  Net -40 ml    Assessment/ Plan: Pt is a 55 y.o. yo male with baseline malignant HTN and CKD who was admitted on 01/23/2021 with pontine hemorrhage with complications- trach- A on CRF Assessment/Plan: 1. Renal-  A on CRF due to events.  Multiple discussions with family have concluded no dialysis now or in the future.  Is non oliguric-  crt stable but BUN high-  will stop diuretic  2. HTN/vol-  previous malignant HTN-  BP now lower and regimen simplified to hydralazine only 50 TID.  Given such high BUN and volume not bad will stop torsemide 3. Anemia-  hgb stable in the 7's 4. Secondary hyperparathyroidism-  high phos but have not given binder given situation  5. Metabolic acidosis-  will decrease bicarb  6. Neuro-  pontine hemorrhage with significant deficits.  However, may be making some progress.... looking at inpatient rehab ? His renal function may be stabilizing so may not be looking at imminent passing ?  Timothy Good    Labs: Basic Metabolic Panel: Recent Labs  Lab 01/18/21 0431 01/19/21 0346 01/25/2021 0332 01/21/21 0356 01/22/21 0140 01/23/21 0309 01/24/21 0317  NA 136   < > 140   < > 139 139 141  K 5.0   < > 4.9   < > 4.8 4.3 3.9  CL 103   < > 103   < > 99 96* 99  CO2 19*   < > 21*   < > _0 GLUCOSE 117*   < > 94   < > 108* 96 104*  BUN 122*   < > 156*   < > 172* 179* 187*   CREATININE 5.53*   < > 6.47*   < > 6.23* 6.24* 6.22*  CALCIUM 9.8   < > 9.9   < > 9.8 9.8 10.0  PHOS 7.2*  --  8.2*  --   --   --   --    < > = values in this interval not displayed.   Liver Function Tests: Recent Labs  Lab 01/18/21 0431 01/10/2021 0332  ALBUMIN 1.9* 1.9*   No results for input(s): LIPASE, AMYLASE in the last 168 hours. No results for input(s): AMMONIA in the last 168 hours. CBC: Recent Labs  Lab 01/19/21 0346 01/21/21 0456 01/22/21 0140 01/23/21 0309 01/24/21 0317  WBC 9.2 12.1* 11.8* 11.9* 11.5*  NEUTROABS 6.0  --   --   --   --   HGB 7.6* 7.6* 7.3* 7.2* 7.2*  HCT 23.9* 23.9* 23.0* 23.2* 23.0*  MCV 89.5 89.2 88.5 90.3 90.6  PLT 358 397 390 423* 466*   Cardiac Enzymes: No results for input(s): CKTOTAL, CKMB, CKMBINDEX, TROPONINI in  the last 168 hours. CBG: Recent Labs  Lab 01/23/21 1544 01/23/21 2009 01/23/21 2343 01/24/21 0334 01/24/21 0805  GLUCAP 95 108* 119* 96 113*    Iron Studies: No results for input(s): IRON, TIBC, TRANSFERRIN, FERRITIN in the last 72 hours. Studies/Results: No results found. Medications: Infusions:  sodium chloride Stopped (01/14/21 1454)   feeding supplement (NEPRO CARB STEADY) 45 mL/hr at 01/23/21 0600    Scheduled Medications:  vitamin C  2,000 mg Per Tube Daily   chlorhexidine gluconate (MEDLINE KIT)  15 mL Mouth Rinse BID   Chlorhexidine Gluconate Cloth  6 each Topical Daily   ciprofloxacin  2 drop Right Eye Q4H while awake   doxycycline  100 mg Per Tube Q12H   erythromycin   Right Eye Q6H   feeding supplement (PROSource TF)  45 mL Per Tube TID   free water  100 mL Per Tube Q6H   heparin injection (subcutaneous)  5,000 Units Subcutaneous Q8H   insulin aspart  0-6 Units Subcutaneous Q4H   mouth rinse  15 mL Mouth Rinse 10 times per day   mupirocin ointment  1 application Nasal BID   pantoprazole sodium  40 mg Per Tube Daily   sodium bicarbonate  650 mg Per Tube TID   thiamine  100 mg Per Tube Daily    torsemide  20 mg Per Tube Daily    have reviewed scheduled and prn medications.  Physical Exam: General:  resting did not rouse Heart: RRR Lungs: CBS bilat  Abdomen: soft, non tender Extremities: min edema     01/24/2021,10:56 AM  LOS: 18 days

## 2021-01-24 NOTE — Progress Notes (Signed)
Nutrition Follow-up  DOCUMENTATION CODES:   Non-severe (moderate) malnutrition in context of chronic illness  INTERVENTION:   Tube feeding via PEG:  Nepro @ 45 ml/hr (1080 ml/day) 45 ml ProSource TF TID  Provides: 2064 kcal, 120 grams protein, and 785 ml free water  100 ml free water every 6 hours  Total free water: 1185 ml   NUTRITION DIAGNOSIS:   Moderate Malnutrition related to chronic illness as evidenced by moderate fat depletion, moderate muscle depletion, severe muscle depletion. Ongoing.   GOAL:   Patient will meet greater than or equal to 90% of their needs Met with TF.   MONITOR:   TF tolerance  REASON FOR ASSESSMENT:   Consult Enteral/tube feeding initiation and management  ASSESSMENT:   Pt with PMH of uncontrolled HTN, CKD stage IV, CHF, HLD, daily ETOH, medication non-adherence, iron deficiency anemia admitted after being found down at home by poilce during a well check. Admitted with a dx of pontine hemorrhage.  Pt discussed during ICU rounds and with RN.  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 Pt DNR with no escalation of care including back on vent support.  Therapy recommends CIR  10/12 cortrak placed; tip gastric 10/18: PEG placement 10/19 s/p temporary tarsorrhaphy 10/21: s/p trach placement    Medications reviewed and include: SSI, protonix, Nabicarb 650 mg BID, thiamine 100 mg daily  Labs reviewed: BUN: 187, Cr: 6.22 CBG's: 96-119  UOP: 1700 ml    Diet Order:   Diet Order             Diet NPO time specified  Diet effective midnight                   EDUCATION NEEDS:   Not appropriate for education at this time  Skin:  Skin Assessment: Reviewed RN Assessment  Last BM:  10/25 large  Height:   Ht Readings from Last 1 Encounters:  01/10/21 $RemoveB'6\' 2"'hHwSLKCE$  (1.88 m)    Weight:    Wt Readings from Last 1 Encounters:  01/23/21 75.4 kg    BMI:  Body mass index is 21.34 kg/m.  Estimated Nutritional Needs:   Kcal:  2000-2200  Protein:  105-120 grams  Fluid:  > 2 L/day  Lockie Pares., RD, LDN, CNSC See AMiON for contact information

## 2021-01-25 DIAGNOSIS — Z515 Encounter for palliative care: Secondary | ICD-10-CM | POA: Diagnosis not present

## 2021-01-25 DIAGNOSIS — I613 Nontraumatic intracerebral hemorrhage in brain stem: Secondary | ICD-10-CM | POA: Diagnosis not present

## 2021-01-25 DIAGNOSIS — J9601 Acute respiratory failure with hypoxia: Secondary | ICD-10-CM | POA: Diagnosis not present

## 2021-01-25 DIAGNOSIS — R627 Adult failure to thrive: Secondary | ICD-10-CM | POA: Diagnosis not present

## 2021-01-25 DIAGNOSIS — N186 End stage renal disease: Secondary | ICD-10-CM | POA: Diagnosis not present

## 2021-01-25 LAB — GLUCOSE, CAPILLARY
Glucose-Capillary: 100 mg/dL — ABNORMAL HIGH (ref 70–99)
Glucose-Capillary: 115 mg/dL — ABNORMAL HIGH (ref 70–99)
Glucose-Capillary: 128 mg/dL — ABNORMAL HIGH (ref 70–99)
Glucose-Capillary: 93 mg/dL (ref 70–99)
Glucose-Capillary: 95 mg/dL (ref 70–99)
Glucose-Capillary: 95 mg/dL (ref 70–99)

## 2021-01-25 LAB — BASIC METABOLIC PANEL
Anion gap: 17 — ABNORMAL HIGH (ref 5–15)
BUN: 200 mg/dL — ABNORMAL HIGH (ref 6–20)
CO2: 26 mmol/L (ref 22–32)
Calcium: 10.3 mg/dL (ref 8.9–10.3)
Chloride: 102 mmol/L (ref 98–111)
Creatinine, Ser: 6.21 mg/dL — ABNORMAL HIGH (ref 0.61–1.24)
GFR, Estimated: 10 mL/min — ABNORMAL LOW (ref >60)
Glucose, Bld: 109 mg/dL — ABNORMAL HIGH (ref 70–99)
Potassium: 3.9 mmol/L (ref 3.5–5.1)
Sodium: 145 mmol/L (ref 135–145)

## 2021-01-25 LAB — CBC
HCT: 24.3 % — ABNORMAL LOW (ref 39.0–52.0)
Hemoglobin: 7.4 g/dL — ABNORMAL LOW (ref 13.0–17.0)
MCH: 27.9 pg (ref 26.0–34.0)
MCHC: 30.5 g/dL (ref 30.0–36.0)
MCV: 91.7 fL (ref 80.0–100.0)
Platelets: 433 10*3/uL — ABNORMAL HIGH (ref 150–400)
RBC: 2.65 MIL/uL — ABNORMAL LOW (ref 4.22–5.81)
RDW: 16.8 % — ABNORMAL HIGH (ref 11.5–15.5)
WBC: 11.4 10*3/uL — ABNORMAL HIGH (ref 4.0–10.5)
nRBC: 0 % (ref 0.0–0.2)

## 2021-01-25 MED ORDER — INSULIN ASPART 100 UNIT/ML IJ SOLN: 0.0000 [IU] | Freq: Four times a day (QID) | INTRAMUSCULAR | Status: DC | PRN

## 2021-01-25 NOTE — Progress Notes (Signed)
Daily Progress Note   Patient Name: Timothy Good       Date: 01/25/2021 DOB: March 17, 1966  Age: 55 y.o. MRN#: [id number] Attending Physician: Barb Merino, MD Primary Care Physician: Raiford Simmonds., PA-C Admit Date: 01/08/2021  Reason for Consultation/Follow-up: Establishing goals of care and Psychosocial/spiritual support   Today's discussion   Spoke to daughter Timothy Good by phone and updated her on patient's current medical situation.  She verbalizes that she and her siblings are struggling with decision to further de-escalate care.  She feels she is getting "mixed messages" regarding her father's condition, she hears from nursing that "he is doing good today" and then she hears from others "he is not going to get better".  Education offered on the importance of looking at the big picture.  [name] renal function continues to decline and there is no desire for dialysis.  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  Re-explored family's decision to  not excalate care, focus on comfort and taking it one day at a time.  Family agree that this is what the patient would want and actually what he is trying to communicate to them.  Timothy Good still believes this is the best decision for her father.  Education offered on patient's limited poor prognosis and the natural trajectory and expectations at end-of-life.    Education offered on hospice benefit, hospice philosophy and residential hospice eligibility.      Physical Exam          Vital Signs: BP 122/83 (BP Location: Left Arm)   Pulse 77   Temp (!) 97.2 F (36.2 C)  (Axillary)   Resp (!) 21   Ht 6\' 2"  (1.88 m)   Wt 75.4 kg   SpO2 98%   BMI 21.34 kg/m  SpO2: SpO2: 98 % O2 Device: O2 Device: Tracheostomy Collar O2 Flow Rate: O2 Flow Rate (L/min): 10 L/min  Intake/output summary:  Intake/Output Summary (Last 24 hours) at 01/25/2021 0941 Last data filed at 01/25/2021 0900 Gross per 24 hour  Intake 1280 ml  Output 1950 ml  Net -670 ml    LBM: Last BM Date: 01/24/21 Baseline Weight: Weight: 101 kg Most  recent weight: Weight: 75.4 kg       Palliative Assessment/Data:  20 % at best      Patient Active Problem List   Diagnosis Date Noted   Endotracheally intubated    Acute respiratory failure with hypoxia (HCC)    Hypertension    Malnutrition of moderate degree 01/10/2021   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 Theador Hawthorne in [place].    55 y/o male with multiple medical problems was found down by in a well call visit by police and was brought to [facility] with a diagnosis of pontine hemorrhage.  He was intubated for airway protection and then transferred to [facility] for further evaluation.      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  CT head right pontine hemorrhage with midbrain and medulla extension, IVH at fourth ventricle MRI  Acute brainstem hemorrhage centered at the pons. Extensive pontine  edema, which tracks associated edema which tracks into the midbrain right > left and into the right medullary pyramid. Mild extension of hemorrhage into the 4th ventricle and basilar subarachnoid spaces. But no ventriculomegaly. No midline shift or impending herniation. Negative noncontrast MRI appearance of the orbits. CT repeat stable hematoma, no hydrocephalus   Currently patient is status post trach and PEG.  Today is day 17 of this hospitalization.   He has successfully been weaned off the ventilator.  He follows intermittent simple commands.      Recommendations/Plan: -  DNR/DNI -  No dialysis, now or in the future -  Continue current medical interventions however do not escalate care -   Daughter request phone call from attending, Dr Sloan Leiter will call today  -  Continue discussion regarding transition of care, possibility of residential hospice discussed.  Palliative medicine will continue to support holistically.     Code Status:    Code Status Orders  (From admission, onward)           Start     Ordered   01/21/21 1804  Do not attempt resuscitation (DNR)  Continuous       Question Answer Comment  In the event of cardiac or respiratory ARREST Do not call a "code blue"   In the event of cardiac or respiratory ARREST Do not perform Intubation, CPR, defibrillation or ACLS   In the event of cardiac or respiratory ARREST Use medication by any route, position, wound care, and other measures to relive pain and suffering. May use oxygen, suction and manual treatment of airway obstruction as needed for comfort.      01/21/21 1803           Code Status History     Date Active Date Inactive Code Status Order ID Comments User Context   01/25/2021 1707 01/21/2021 1803 Full Code [id number]  Lorenza Chick, MD Inpatient       Prognosis:  < 4 weeks  Discharge Planning: To Be Determined  Care plan was discussed with Dr Karsten Fells and Dr Leonie Man and bedside RN  Thank  you for allowing the Palliative Medicine Team to assist in the care of this patient.   Time In: 1000 Time Out: 1040 Total Time 40 minutes Prolonged Time Billed  no       Greater than 50%  of this time was spent counseling and coordinating care related to the above assessment and plan.  Wadie Lessen, NP  Please contact Palliative Medicine Team phone at [phone] for questions and concerns.

## 2021-01-25 NOTE — Progress Notes (Signed)
   ENT Progress Note: POD #5 s/p Procedure(s): TRACHEOSTOMY   Subjective: Patient stable  Objective: Vital signs in last 24 hours: Temp:  [97.2 F (36.2 C)-97.9 F (36.6 C)] 97.2 F (36.2 C) (10/26 0800) Pulse Rate:  [77-92] 77 (10/26 0900) Resp:  [18-39] 21 (10/26 0900) BP: (109-126)/(63-83) 122/83 (10/26 0800) SpO2:  [91 %-99 %] 98 % (10/26 0900) FiO2 (%):  [35 %-40 %] 40 % (10/26 0751) Weight change:  Last BM Date: 01/24/21  Intake/Output from previous day: 10/25 0701 - 10/26 0700 In: 1280 [NG/GT:1280] Out: 1950 [Urine:1950] Intake/Output this shift: Total I/O In: 135 [NG/GT:135] Out: -   Labs: Recent Labs    01/24/21 0317 01/25/21 0500  WBC 11.5* 11.4*  HGB 7.2* 7.4*  HCT 23.0* 24.3*  PLT 466* 433*   Recent Labs    01/24/21 0317 01/25/21 0500  NA 141 145  K 3.9 3.9  CL 99 102  CO2 26 26  GLUCOSE 104* 109*  BUN 187* 200*  CALCIUM 10.0 10.3    Studies/Results: No results found.   PHYSICAL EXAM: Shiley tracheostomy tube in place, no bleeding.  Airway stable.   Assessment/Plan: Patient underwent tracheostomy 5 days ago and developed acute postoperative bleeding from his stoma.   Ribbon gauze packing placed around his tracheostomy tube to control further bleeding, stable airway. Packing and sutures removed today, patient's airway stable, no further bleeding.  Monitor for additional concerns and reconsult as needed.  Continue current trach and neurologic care.    Jerrell Belfast 01/25/2021, 10:56 AM

## 2021-01-25 NOTE — Progress Notes (Addendum)
STROKE TEAM PROGRESS NOTE   INTERVAL HISTORY No family at bedside. Patient in bed. Trach and peg in place. Right remains with suture. No events overnight per nurse.  Vitals:   01/25/21 0700 01/25/21 0751 01/25/21 0800 01/25/21 0900  BP:  126/80 122/83   Pulse: 79 77 82 77  Resp: 20 20 18  (!) 21  Temp:   (!) 97.2 F (36.2 C)   TempSrc:   Axillary   SpO2: 99% 99% 98% 98%  Weight:      Height:       CBC:  Recent Labs  Lab 01/19/21 0346 01/21/21 0456 01/24/21 0317 01/25/21 0500  WBC 9.2   < > 11.5* 11.4*  NEUTROABS 6.0  --   --   --   HGB 7.6*   < > 7.2* 7.4*  HCT 23.9*   < > 23.0* 24.3*  MCV 89.5   < > 90.6 91.7  PLT 358   < > 466* 433*   < > = values in this interval not displayed.   Basic Metabolic Panel:  Recent Labs  Lab 01/07/2021 0332 01/21/21 0356 01/24/21 0317 01/25/21 0500  NA 140   < > 141 145  K 4.9   < > 3.9 3.9  CL 103   < > 99 102  CO2 21*   < > 26 26  GLUCOSE 94   < > 104* 109*  BUN 156*   < > 187* 200*  CREATININE 6.47*   < > 6.22* 6.21*  CALCIUM 9.9   < > 10.0 10.3  PHOS 8.2*  --   --   --    < > = values in this interval not displayed.    IMAGING past 24 hours No new imaging to review  PHYSICAL EXAM  General - Well nourished, well developed middle-aged African-American male status Good tracheostomy on trach collar, in no apparent distress.   Ophthalmologic - fundi not visualized due to noncooperation.   Cardiovascular - Regular rhythm and rate.   Mental Status -  Patient is status Good tracheostomy.  On trach collar.   Awake and drowsy. Will Follow some commands. Right eye sutured shut.  Left eye saccadic dysmetria and nystagmus on left gaze.   Cranial Nerves II - XII - nystagmus noted.    Motor Strength - left side flaccid. Antigravity purposeful movements on right. Moved right UE and gripped to command. Did not move right foot for me.   Coordination - The patient had normal movements in the hands and feet with no ataxia or  dysmetria.  Tremor was absent.   Gait and Station - deferred.   Temp:  [97.2 F (36.2 C)-97.9 F (36.6 C)] 97.2 F (36.2 C) (10/26 0800) Pulse Rate:  [77-92] 77 (10/26 0900) Resp:  [18-32] 21 (10/26 0900) BP: (109-126)/(63-83) 122/83 (10/26 0800) SpO2:  [91 %-99 %] 98 % (10/26 0900) FiO2 (%):  [35 %-40 %] 40 % (10/26 0751)   ASSESSMENT/PLAN Timothy Good is a 55 y.o. male with a past medical history significant for uncontrolled hypertension, CKD stage IV, heart failure, hyperlipidemia, daily drinking, medication nonadherence, iron deficiency anemia/anemia of chronic disease, presenting with a pontine hemorrhage. ICH Score: 3.     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  CT head right pontine hemorrhage with midbrain and medulla extension, IVH at fourth ventricle MRI  Acute brainstem hemorrhage centered at the pons. Extensive pontine edema, which tracks associated edema which  tracks into the midbrain right > left and into the right medullary pyramid. Mild extension of hemorrhage into the 4th ventricle and basilar subarachnoid spaces. But no ventriculomegaly. No midline shift or impending herniation. Negative noncontrast MRI appearance of the orbits. CT repeat stable hematoma, no hydrocephalus Consider MRA to rule out AVM or aneurysm once hematoma resolves. 2D Echo EF 55 to 60%.  LDL 75 HgbA1c 5.4 VTE prophylaxis - Heparin subcu Not on Anticoagulant or antiplatelet prior to admission, now on no antithrombotics due to [place] Therapy recommendations:  CIR->most likely SNF vs residential hospice (pending family decision) Palliatve care met w/ family - poor prognosis, not a HD candidate now or in the future. Made DNR. Do not escalate care. Discussed comfort care. Family considering; palliative following Disposition: Pending  Ok for transfer to the floor  Cerebral Edema, stable Neurosurgery consulted, no acute intervention   Treated w/ 3% saline Sodium trending down with daily check   Na 141   Acute respiratory failure Probable aspiration pneumonia Extubated First on Unasyn->Rocephin->off Afebrile  Resp culture: few gram positive cocci PEG 10/18 with Trauma Trach 10/21 with ENT  on trach collar     Hypertensive emergency Home meds: none Stable now BP goal less than 160  Currently on Norvasc 10, Coreg 25, hydralazine 50 Q8 Long-term BP goal normotensive   Hyperlipidemia Home meds:  None LDL  75, almost at goal < 70 May consider statin at discharge   AKI on CKD Ischemic ATN Anemia  Hyperkalemia  Creatinine 3.65-3.80-5.2-6.27-5.69-5.46-5.44-5.60-5.53->5.90->6.3->6.23->6.24->6.22 Hb 8.2->7.6->7.7->7.3->7.1->6.6->PRBC->8.1->7.6-.7.6->7.3->7.2->7.2 K 4.8 On tube feeding and free water  Urine adequate Nephrology on board Not an acute or long-term HD candidate    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 Eulas Good Cipro eyedrop during the day and erythromycin ointment nightly Continue Vit C 2g and docxy 100 bid Appreciate ophthal Dr. Roda Good help   ETOH use disorder with dependence Daily use of excessive liquor per family (?1/2 bottle vodka)  Thiamine, folate and MVI on board Vitamin B12 -213 No s/s of withdrawal  Dysphagia Continue tube feeding and free water  Speech on board CT Chest Abd Pelvis: generalized gastric wall thickening, with associated fat stranding in the gastrohepatic ligament.Findings are nonspecific and could be due to noninflammatory edema,gastritis, peptic ulcer disease or neoplasm.  PEG done 10/18   Tobacco abuse Current smoker Smoking cessation counseling provided   Other Stroke Risk Factors ETOH use disorder alcohol level <10, advised to drink no more than 2 drink(s) a day   Other Active  Problems Multinodular goiter   Hospital day # [address]   Timothy Morale, MSN, NP-C [facility] [phone]   ATTENDING ATTESTATION:  Dr. Reeves Good evaluated pt independently, reviewed imaging, chart, labs. Discussed and formulated plan with the APP. Please see APP note above for details.       This patient is critically ill due to respiratory distress, stroke and at significant risk of neurological worsening, death form heart failure, respiratory failure, recurrent stroke, bleeding from [facility], seizure, sepsis. This patient's care requires constant monitoring of vital signs, hemodynamics, respiratory and cardiac monitoring, review of multiple databases, neurological assessment, discussion with family, other specialists and medical decision making of high complexity. I spent 35 minutes of neurocritical care time in the care of this patient.   Alzina Golda,MD   To contact Stroke Continuity provider, please refer to [email or web address] After hours, contact General  Neurology

## 2021-01-25 NOTE — Progress Notes (Signed)
Physical Therapy Treatment Patient Details Name: Timothy Good MRN: [id number] DOB: 06-Feb-1966 Today's Date: 01/25/2021   History of Present Illness 55 y.o. male presents to [facility] hospital on 01/10/2021, found down during a well check. Pt was emergently intubated and head CT revealed pontine hemorrhage. Intubated 10/7. PEG placed 10/19, s/p temporary tarsorrhaphy on 10/19 R eye due to facial nerve palsy related to the hemorrhage as well as bil horizontal gaze plasy, trach 62/95 (complicated by goiter). PMH includes HTN, CKD stage IV, HLD, alcohol abuse, anemia.    PT Comments    Pt limited by fatigue during session but is able to progress to standing attempts with this PT. Pt continues to require significant physical assistance for all functional mobility, remaining flaccid on L side and with significant R side and trunk weakness. Pt remains at a high falls risk due to weakness and imbalance. Pt also demonstrates impaired head control, reporting some discomfort with PT attempts to extend cervical spine to neutral. Pt will benefit from continued acute PT services to improve mobility quality and reduce falls risk. PT updates recommendations to SNF as the pt has made limited progress with acute therapies to this point and will likely need longer term rehab if functional gains are to be made.   Recommendations for follow up therapy are one component of a multi-disciplinary discharge planning process, led by the attending physician.  Recommendations may be updated based on patient status, additional functional criteria and insurance authorization.  Follow Up Recommendations  Skilled nursing-short term rehab (<3 hours/day)     Assistance Recommended at Discharge Frequent or constant Supervision/Assistance  Equipment Recommendations  Wheelchair (measurements PT);Wheelchair cushion (measurements PT);Hospital bed (hoyer lift)    Recommendations for Other Services       Precautions / Restrictions  Precautions Precautions: Fall Precaution Comments: trach, PEG, L hemi Restrictions Weight Bearing Restrictions: No     Mobility  Bed Mobility Overal bed mobility: Needs Assistance Bed Mobility: Supine to Sit;Sit to Supine;Rolling Rolling: Total assist   Supine to sit: Max assist;HOB elevated Sit to supine: Total assist        Transfers Overall transfer level: Needs assistance Equipment used: 1 person hand held assist Transfers: Sit to/from Stand Sit to Stand: Total assist           General transfer comment: pt performs 2 sit to stand attempts with PT knee block and RUE support. Initial attempt able to complete 2/3 of stand, 2nd attempt able to fully extend hips. Pt with impaired head control leading to flexed head/neck    Ambulation/Gait                 Stairs             Wheelchair Mobility    Modified Rankin (Stroke Patients Only) Modified Rankin (Stroke Patients Only) Pre-Morbid Rankin Score: No significant disability Modified Rankin: Severe disability     Balance Overall balance assessment: Needs assistance Sitting-balance support: Feet supported;Single extremity supported Sitting balance-Leahy Scale: Poor Sitting balance - Comments: maxA Postural control: Posterior lean Standing balance support: Single extremity supported Standing balance-Leahy Scale: Zero Standing balance comment: totalA                            Cognition Arousal/Alertness: Awake/alert Behavior During Therapy: Flat affect Overall Cognitive Status: Impaired/Different from baseline Area of Impairment: Following commands  Following Commands: Follows one step commands with increased time                Exercises      General Comments General comments (skin integrity, edema, etc.): pt on 10L 40% FiO2 during session, sats drop to 87% intermittently during session, back to 92% at end of session      Pertinent  Vitals/Pain Pain Assessment: Faces Faces Pain Scale: No hurt Facial Expression: Relaxed, neutral Body Movements: Absence of movements Muscle Tension: Relaxed Compliance with ventilator (intubated pts.): N/A Vocalization (extubated pts.): Talking in normal tone or no sound CPOT Total: 0    Home Living                          Prior Function            PT Goals (current goals can now be found in the care plan section) Acute Rehab PT Goals Patient Stated Goal: unable to state Progress towards PT goals: Not progressing toward goals - comment (remains max-totalA, L side remains flaccid)    Frequency    Min 3X/week      PT Plan Current plan remains appropriate    Co-evaluation              AM-PAC PT "6 Clicks" Mobility   Outcome Measure  Help needed turning from your back to your side while in a flat bed without using bedrails?: Total Help needed moving from lying on your back to sitting on the side of a flat bed without using bedrails?: A Lot Help needed moving to and from a bed to a chair (including a wheelchair)?: Total Help needed standing up from a chair using your arms (e.g., wheelchair or bedside chair)?: Total Help needed to walk in hospital room?: Total Help needed climbing 3-5 steps with a railing? : Total 6 Click Score: 7    End of Session Equipment Utilized During Treatment: Oxygen Activity Tolerance: Patient limited by fatigue Patient left: in bed;with call bell/phone within reach;with bed alarm set Nurse Communication: Mobility status;Need for lift equipment PT Visit Diagnosis: Other abnormalities of gait and mobility (R26.89);Muscle weakness (generalized) (M62.81);Other symptoms and signs involving the nervous system (R29.898)     Time: 9163-8466 PT Time Calculation (min) (ACUTE ONLY): 30 min  Charges:  $Therapeutic Activity: 23-37 mins                     Zenaida Niece, PT, DPT Acute Rehabilitation Pager: [phone] Office  Timothy Good 01/25/2021, 5:05 PM

## 2021-01-25 NOTE — Progress Notes (Signed)
Subjective:  Does not seem like significant change - 1900 of UOP -  torsemide stopped yesterday - crt pretty stable but BUN very high   Objective Vital signs in last 24 hours: Vitals:   01/25/21 0751 01/25/21 0800 01/25/21 0900 01/25/21 1126  BP: 126/80 122/83  122/83  Pulse: 77 82 77 95  Resp: 20 18 (!) 21 (!) 24  Temp:  (!) 97.2 F (36.2 C)    TempSrc:  Axillary    SpO2: 99% 98% 98% 95%  Weight:      Height:       Weight change:   Intake/Output Summary (Last 24 hours) at 01/25/2021 1242 Last data filed at 01/25/2021 1000 Gross per 24 hour  Intake 1190 ml  Output 1500 ml  Net -310 ml    Assessment/ Plan: Pt is a 55 y.o. yo male with baseline malignant HTN and CKD who was admitted on 01/28/2021 with pontine hemorrhage with complications- trach- A on CRF Assessment/Plan: 1. Renal-  A on CRF due to events.  Multiple discussions with family have concluded no dialysis now or in the future.  Is non oliguric-  crt stable but BUN high-  have stopped diuretic  2. HTN/vol-  previous malignant HTN-  BP now lower and regimen simplified to hydralazine only 50 TID.  Given such high BUN and volume not bad have stopped torsemide 3. Anemia-  hgb stable in the 7's 4. Secondary hyperparathyroidism-  high phos but have not given binder given situation  5. Metabolic acidosis-  decreased bicarb  6. Neuro-  pontine hemorrhage with significant deficits.    Unfortunately renal has little to offer at this point if no dialysis is desired.  Family is torn-  not ready to consider full comfort but with more time without significant improvement might prompt them to move forward.  Renal will sign off for now, please call if further questions     Louis Meckel    Labs: Basic Metabolic Panel: Recent Labs  Lab 01/01/2021 0332 01/21/21 0356 01/23/21 0309 01/24/21 0317 01/25/21 0500  NA 140   < > 139 141 145  K 4.9   < > 4.3 3.9 3.9  CL 103   < > 96* 99 102  CO2 21*   < > $R'24 26 26  'OI$ GLUCOSE  94   < > 96 104* 109*  BUN 156*   < > 179* 187* 200*  CREATININE 6.47*   < > 6.24* 6.22* 6.21*  CALCIUM 9.9   < > 9.8 10.0 10.3  PHOS 8.2*  --   --   --   --    < > = values in this interval not displayed.   Liver Function Tests: Recent Labs  Lab 01/03/2021 0332  ALBUMIN 1.9*   No results for input(s): LIPASE, AMYLASE in the last 168 hours. No results for input(s): AMMONIA in the last 168 hours. CBC: Recent Labs  Lab 01/19/21 0346 01/21/21 0456 01/22/21 0140 01/23/21 0309 01/24/21 0317 01/25/21 0500  WBC 9.2 12.1* 11.8* 11.9* 11.5* 11.4*  NEUTROABS 6.0  --   --   --   --   --   HGB 7.6* 7.6* 7.3* 7.2* 7.2* 7.4*  HCT 23.9* 23.9* 23.0* 23.2* 23.0* 24.3*  MCV 89.5 89.2 88.5 90.3 90.6 91.7  PLT 358 397 390 423* 466* 433*   Cardiac Enzymes: No results for input(s): CKTOTAL, CKMB, CKMBINDEX, TROPONINI in the last 168 hours. CBG: Recent Labs  Lab 01/24/21 2012 01/24/21 2334 01/25/21  9447 01/25/21 0806 01/25/21 1152  GLUCAP 113* 112* 115* 95 93    Iron Studies: No results for input(s): IRON, TIBC, TRANSFERRIN, FERRITIN in the last 72 hours. Studies/Results: No results found. Medications: Infusions:  sodium chloride Stopped (01/14/21 1454)   feeding supplement (NEPRO CARB STEADY) 45 mL/hr at 01/25/21 0500    Scheduled Medications:  vitamin C  2,000 mg Per Tube Daily   chlorhexidine gluconate (MEDLINE KIT)  15 mL Mouth Rinse BID   Chlorhexidine Gluconate Cloth  6 each Topical Daily   ciprofloxacin  2 drop Right Eye Q4H while awake   doxycycline  100 mg Per Tube Q12H   erythromycin   Right Eye Q6H   feeding supplement (PROSource TF)  45 mL Per Tube TID   free water  100 mL Per Tube Q6H   heparin injection (subcutaneous)  5,000 Units Subcutaneous Q8H   insulin aspart  0-6 Units Subcutaneous Q4H   mouth rinse  15 mL Mouth Rinse 10 times per day   pantoprazole sodium  40 mg Per Tube Daily   sodium bicarbonate  650 mg Per Tube BID   thiamine  100 mg Per Tube Daily     have reviewed scheduled and prn medications.  Physical Exam: General:  resting did not rouse Heart: RRR Lungs: CBS bilat  Abdomen: soft, non tender Extremities: min edema     01/25/2021,12:42 PM  LOS: 19 days

## 2021-01-25 NOTE — Progress Notes (Signed)
PROGRESS NOTE    Timothy Good  XBJ:[id number] DOB: 1965-12-13 DOA: 01/05/2021 PCP: Raiford Simmonds., PA-C    Brief Narrative:  55 year old gentleman with multiple medical issues who was found down at home and found by police on a welfare visit.  Has history of uncontrolled hypertension, CKD stage IV, heart failure, hyperlipidemia, alcohol use, medication nonadherence, iron-deficiency anemia.  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 10/7, admitted to ICU and intubated.  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 10/21, tracheostomy and liberation from the ventilator.  PEG tube insertion. 10/22, seen by palliative changed to DNR.  DNR with no escalation of care and no dialysis. 10/25, transferred to [facility] care.  Remains on very poor clinical status.   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 Continue to work with physical therapy and Occupational Therapy.  Acute hypoxemic respiratory failure secondary to pontine hemorrhage.  Ventilator dependent and now status post tracheostomy on trach collar: Currently remains off ventilator, able to maintain on trach collar. He does have some bleeding around the tracheostomy, old clotted blood.  ENT to follow. Continue  aggressive pulmonary therapy/breathing exercises and respiratory care.  Family decided not to go back on ventilator.  Acute kidney injury on chronic kidney disease stage IV, ischemic ATN: Decided no dialysis now or in the future as per palliative care discussion. Followed by nephrology. Currently on free water flush through the tube. BUN and creatinine remains elevated, potassium is stable.  Urine output is adequate.  Hypertensive emergency: Blood pressures are low normal now.  All his antihypertensives are on hold.  He is on torsemide.  He is on as needed hydralazine and labetalol with goal of systolic blood pressure less than 160.  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 Eulas Post.  Cipro eyedrops and erythromycin eyedrops.  On oral doxycycline.  Dysphagia: On PEG tube feeding.  Goal of care: Palliative closely following up with family.  Patient currently stabilized but remains in very poor clinical status.  10/26, call made to patient's daughter and son on phone.  Discussed in detail and updated about patient's clinical status.  After tracheostomy and PEG tube feeding, he has been stabilized but remains in poor clinical status with unknown recovery and rehab. Dr. [name] noted that every time they visit him he writes stop with his right hand.  They were not sure whether he is comprehending complex decision making or not.  Family understand that his kidneys may give out and ultimately he may die. We discussed whether they agree that he is ready for comfort care and hospice, family is having a hard time now that he is already gone through artificial nutrition and tracheostomy.  They would like to see how he does.  They would like to see him gradually improving, however if he  decompensates or deteriorates they will be agreeable to have hospice discussion. Family also asked whether it is possible to transfer him to other hospital  for second opinion, I explained to them that he is on maximum care already and optimized with artificial nutrition and tracheostomy.  There is no additional intervention that cannot be done in this hospital.  Family agreeable.   DVT prophylaxis: heparin injection 5,000 Units Start: 01/15/21 0100 SCD's Start: 12/31/2020 1701   Code Status: DNR Family Communication: Daughter and son on the phone. Disposition Plan: Status is: Inpatient  Remains inpatient appropriate because: Unsafe discharge plan.  Not a stable to transfer.          Consultants:  ENT Critical care Palliative care  Procedures:  Right lead tarsorrhaphy Tracheostomy PEG tube placement  Antimicrobials:  Ciprofloxacin eyedrops.  Doxycycline by mouth.   Subjective: Patient seen and examined.  Condition remains unchanged.  He is on tube feeding.  He is on trach collar.  He follows commands with his right hand.  He squeezes my hand.  Left side is flaccid.  Objective: Vitals:   01/25/21 0700 01/25/21 0751 01/25/21 0800 01/25/21 0900  BP:  126/80 122/83   Pulse: 79 77 82 77  Resp: _0 (!) 21  Temp:   (!) 97.2 F (36.2 C)   TempSrc:   Axillary   SpO2: 99% 99% 98% 98%  Weight:      Height:        Intake/Output Summary (Last 24 hours) at 01/25/2021 1059 Last data filed at 01/25/2021 1000 Gross per 24 hour  Intake 1280 ml  Output 1950 ml  Net -670 ml   Filed Weights   01/21/21 0500 01/22/21 0147 01/23/21 0405  Weight: 73.8 kg 76 kg 75.4 kg    Examination:  General exam: Sick looking.  Slightly anxious but looks comfortable. Right eye lead are sutured together. He has tracheostomy with plenty of clotted blood and packing done. He is alert and awake on his stimulation, follows commands on the right side.  Left side is flaccid. Respiratory system: Conducted airway sounds. SpO2: 98 % O2 Flow Rate (L/min): 10 L/min FiO2 (%): 40 %  Cardiovascular system: S1 & S2 heard, RRR.  Gastrointestinal  system: Soft.  Nontender.  Bowel sounds present.  PEG tube in place.    Data Reviewed: I have personally reviewed following labs and imaging studies  CBC: Recent Labs  Lab 01/19/21 0346 01/21/21 0456 01/22/21 0140 01/23/21 0309 01/24/21 0317 01/25/21 0500  WBC 9.2 12.1* 11.8* 11.9* 11.5* 11.4*  NEUTROABS 6.0  --   --   --   --   --   HGB 7.6* 7.6* 7.3* 7.2* 7.2* 7.4*  HCT 23.9* 23.9* 23.0* 23.2* 23.0* 24.3*  MCV 89.5 89.2 88.5 90.3 90.6 91.7  PLT 358 397 390 423* 466* 517*   Basic Metabolic Panel: Recent Labs  Lab 01/04/2021 0332 01/21/21 0356 01/22/21 0140 01/23/21 0309 01/24/21 0317 01/25/21 0500  NA 140 139 139 139 141 145  K 4.9 5.3* 4.8 4.3 3.9 3.9  CL 103 101 99 96* 99 102  CO2 21* _1 GLUCOSE 94 107* 108* 96 104* 109*  BUN 156* 166* 172* 179* 187* 200*  CREATININE 6.47* 6.30* 6.23* 6.24* 6.22* 6.21*  CALCIUM 9.9 10.1 9.8 9.8 10.0 10.3  PHOS 8.2*  --   --   --   --   --    GFR: Estimated Creatinine Clearance: 14.7 mL/min (A) (by  C-G formula based on SCr of 6.21 mg/dL (H)). Liver Function Tests: Recent Labs  Lab 01/29/2021 0332  ALBUMIN 1.9*   No results for input(s): LIPASE, AMYLASE in the last 168 hours. No results for input(s): AMMONIA in the last 168 hours. Coagulation Profile: Recent Labs  Lab 01/23/2021 0332  INR 1.2   Cardiac Enzymes: No results for input(s): CKTOTAL, CKMB, CKMBINDEX, TROPONINI in the last 168 hours. BNP (last 3 results) No results for input(s): PROBNP in the last 8760 hours. HbA1C: No results for input(s): HGBA1C in the last 72 hours. CBG: Recent Labs  Lab 01/24/21 1534 01/24/21 2012 01/24/21 2334 01/25/21 0342 01/25/21 0806  GLUCAP 97 113* 112* 115* 95   Lipid Profile: No results for input(s): CHOL, HDL, LDLCALC, TRIG, CHOLHDL, LDLDIRECT in the last 72 hours. Thyroid Function Tests: No results for input(s): TSH, T4TOTAL, FREET4, T3FREE, THYROIDAB in the last 72 hours. Anemia Panel: No results for  input(s): VITAMINB12, FOLATE, FERRITIN, TIBC, IRON, RETICCTPCT in the last 72 hours. Sepsis Labs: No results for input(s): PROCALCITON, LATICACIDVEN in the last 168 hours.  Recent Results (from the past 240 hour(s))  Surgical PCR screen     Status: None   Collection Time: 01/22/2021  4:54 AM   Specimen: Nasal Mucosa; Nasal Swab  Result Value Ref Range Status   MRSA, PCR NEGATIVE NEGATIVE Final   Staphylococcus aureus NEGATIVE NEGATIVE Final    Comment: (NOTE) The Xpert SA Assay (FDA approved for NASAL specimens in patients 32 years of age and older), is one component of a comprehensive surveillance program. It is not intended to diagnose infection nor to guide or monitor treatment. Performed at [facility], [place] [address], [place], [place] [address]          Radiology Studies: No results found.      Scheduled Meds:  vitamin C  2,000 mg Per Tube Daily   chlorhexidine gluconate (MEDLINE KIT)  15 mL Mouth Rinse BID   Chlorhexidine Gluconate Cloth  6 each Topical Daily   ciprofloxacin  2 drop Right Eye Q4H while awake   doxycycline  100 mg Per Tube Q12H   erythromycin   Right Eye Q6H   feeding supplement (PROSource TF)  45 mL Per Tube TID   free water  100 mL Per Tube Q6H   heparin injection (subcutaneous)  5,000 Units Subcutaneous Q8H   insulin aspart  0-6 Units Subcutaneous Q4H   mouth rinse  15 mL Mouth Rinse 10 times per day   pantoprazole sodium  40 mg Per Tube Daily   sodium bicarbonate  650 mg Per Tube BID   thiamine  100 mg Per Tube Daily   Continuous Infusions:  sodium chloride Stopped (01/14/21 1454)   feeding supplement (NEPRO CARB STEADY) 45 mL/hr at 01/25/21 0500     LOS: 19 days    Time spent: 35  minutes    Barb Merino, MD [facility] Pager [phone]

## 2021-01-26 DIAGNOSIS — Z430 Encounter for attention to tracheostomy: Secondary | ICD-10-CM | POA: Diagnosis not present

## 2021-01-26 DIAGNOSIS — I613 Nontraumatic intracerebral hemorrhage in brain stem: Secondary | ICD-10-CM | POA: Diagnosis not present

## 2021-01-26 DIAGNOSIS — J9601 Acute respiratory failure with hypoxia: Secondary | ICD-10-CM | POA: Diagnosis not present

## 2021-01-26 DIAGNOSIS — I161 Hypertensive emergency: Secondary | ICD-10-CM | POA: Diagnosis not present

## 2021-01-26 LAB — CBC
HCT: 24.2 % — ABNORMAL LOW (ref 39.0–52.0)
Hemoglobin: 7.4 g/dL — ABNORMAL LOW (ref 13.0–17.0)
MCH: 28.2 pg (ref 26.0–34.0)
MCHC: 30.6 g/dL (ref 30.0–36.0)
MCV: 92.4 fL (ref 80.0–100.0)
Platelets: 484 10*3/uL — ABNORMAL HIGH (ref 150–400)
RBC: 2.62 MIL/uL — ABNORMAL LOW (ref 4.22–5.81)
RDW: 17 % — ABNORMAL HIGH (ref 11.5–15.5)
WBC: 11.3 10*3/uL — ABNORMAL HIGH (ref 4.0–10.5)
nRBC: 0 % (ref 0.0–0.2)

## 2021-01-26 LAB — RENAL FUNCTION PANEL
Albumin: 2.2 g/dL — ABNORMAL LOW (ref 3.5–5.0)
Anion gap: 18 — ABNORMAL HIGH (ref 5–15)
BUN: 205 mg/dL — ABNORMAL HIGH (ref 6–20)
CO2: 23 mmol/L (ref 22–32)
Calcium: 10.3 mg/dL (ref 8.9–10.3)
Chloride: 104 mmol/L (ref 98–111)
Creatinine, Ser: 5.93 mg/dL — ABNORMAL HIGH (ref 0.61–1.24)
GFR, Estimated: 11 mL/min — ABNORMAL LOW (ref >60)
Glucose, Bld: 108 mg/dL — ABNORMAL HIGH (ref 70–99)
Phosphorus: 8.6 mg/dL — ABNORMAL HIGH (ref 2.5–4.6)
Potassium: 4 mmol/L (ref 3.5–5.1)
Sodium: 145 mmol/L (ref 135–145)

## 2021-01-26 LAB — HEPATIC FUNCTION PANEL
ALT: 99 U/L — ABNORMAL HIGH (ref 0–44)
AST: 36 U/L (ref 15–41)
Albumin: 2.2 g/dL — ABNORMAL LOW (ref 3.5–5.0)
Alkaline Phosphatase: 98 U/L (ref 38–126)
Bilirubin, Direct: <0.1 mg/dL (ref 0.0–0.2)
Total Bilirubin: 0.8 mg/dL (ref 0.3–1.2)
Total Protein: 8 g/dL (ref 6.5–8.1)

## 2021-01-26 LAB — GLUCOSE, CAPILLARY
Glucose-Capillary: 104 mg/dL — ABNORMAL HIGH (ref 70–99)
Glucose-Capillary: 108 mg/dL — ABNORMAL HIGH (ref 70–99)
Glucose-Capillary: 113 mg/dL — ABNORMAL HIGH (ref 70–99)
Glucose-Capillary: 115 mg/dL — ABNORMAL HIGH (ref 70–99)
Glucose-Capillary: 94 mg/dL (ref 70–99)

## 2021-01-26 NOTE — Progress Notes (Signed)
STROKE TEAM PROGRESS NOTE   INTERVAL HISTORY No family at the bedside. Pt lying in bed, on trach but with copious secretions and intermittent coughing, lethargic but still following simple commands on the right hand and foot. Left still hemiplegia  Vitals:   01/26/21 0700 01/26/21 0734 01/26/21 0800 01/26/21 1132  BP:  123/84 115/79 115/79  Pulse: 86 89 89 90  Resp: (!) 22 (!) 25 (!) 28 (!) 23  Temp:   98.1 F (36.7 C)   TempSrc:  Oral Axillary   SpO2: 93% 94% 93% 92%  Weight:      Height:       CBC:  Recent Labs  Lab 01/25/21 0500 01/26/21 0413  WBC 11.4* 11.3*  HGB 7.4* 7.4*  HCT 24.3* 24.2*  MCV 91.7 92.4  PLT 433* 350*   Basic Metabolic Panel:  Recent Labs  Lab 01/09/2021 0332 01/21/21 0356 01/25/21 0500 01/26/21 0413  NA 140   < > 145 145  K 4.9   < > 3.9 4.0  CL 103   < > 102 104  CO2 21*   < > 26 23  GLUCOSE 94   < > 109* 108*  BUN 156*   < > 200* 205*  CREATININE 6.47*   < > 6.21* 5.93*  CALCIUM 9.9   < > 10.3 10.3  PHOS 8.2*  --   --  8.6*   < > = values in this interval not displayed.     IMAGING past 24 hours CT ABDOMEN PELVIS WO CONTRAST  Result Date: 01/07/2021 CLINICAL DATA:  Inpatient. Acute nonlocalized abdominal pain. Pontine hemorrhage. EXAM: CT ABDOMEN AND PELVIS WITHOUT CONTRAST TECHNIQUE: Multidetector CT imaging of the abdomen and pelvis was performed following the standard protocol without IV contrast. COMPARISON:  01/09/2021 abdominal radiograph. FINDINGS: Lower chest: Mild to moderate dependent bibasilar atelectasis. Coronary atherosclerosis. Hepatobiliary: Normal liver size. No liver mass. Prominently distended (6.4 cm diameter) gallbladder. No gallbladder wall thickening. No pericholecystic fluid. No radiopaque cholelithiasis. No biliary ductal dilatation. CBD diameter 5 mm. Pancreas: No pancreatic duct dilation or discrete pancreatic mass. Mild peripancreatic fat stranding at the pancreatic head. No peripancreatic fluid collections.  Spleen: Normal size. No mass. Adrenals/Urinary Tract: No discrete adrenal nodules. No hydronephrosis. No renal stones. Simple 1.8 cm lower right renal cyst. Scattered subcentimeter hyperdense and hypodense bilateral renal cortical lesions are too small to characterize and require no follow-up. Bladder completely collapsed by indwelling Foley catheter with expected gas in the nondependent bladder lumen. Stomach/Bowel: Enteric tube terminates in duodenal bulb. Stomach is collapsed. Suggestion of generalized gastric wall thickening, poorly assessed on this scan, with associated fat stranding in the gastrohepatic ligament. Normal caliber small bowel with no small bowel wall thickening. Normal appendix. Moderate diffuse colonic diverticulosis, with no large bowel wall thickening or significant pericolonic fat stranding. Vascular/Lymphatic: Atherosclerotic nonaneurysmal abdominal aorta. No pathologically enlarged lymph nodes in the abdomen or pelvis. Reproductive: Normal size prostate. Other: No pneumoperitoneum. Small volume ascites, predominantly perihepatic. No focal fluid collections. Symmetric mild-to-moderate bilateral gynecomastia. Musculoskeletal: No aggressive appearing focal osseous lesions. Mild thoracolumbar spondylosis. Chronic appearing mild T10 vertebral compression fracture. IMPRESSION: 1. Suggestion of generalized gastric wall thickening, poorly assessed on this scan due to gastric decompression by enteric tube, with associated fat stranding in the gastrohepatic ligament. Findings are nonspecific and could be due to noninflammatory edema, gastritis, peptic ulcer disease or neoplasm. Consider upper endoscopic correlation as clinically warranted. 2. Small volume ascites, predominantly perihepatic. 3. Prominently distended gallbladder. No radiopaque cholelithiasis.  No gallbladder wall thickening. No pericholecystic fluid. No biliary ductal dilatation. 4. Moderate diffuse colonic diverticulosis. 5. Mild to  moderate dependent bibasilar atelectasis. 6. Coronary atherosclerosis. 7. Chronic appearing mild T10 vertebral compression fracture. 8. Aortic Atherosclerosis (ICD10-I70.0). Electronically Signed   By: Ilona Sorrel M.D.   On: 01/07/2021 13:14   DG Chest 1 View  Result Date: 01/12/2021 CLINICAL DATA:  Intubation, stroke EXAM: CHEST  1 VIEW COMPARISON:  Portable exam 0942 hours compared to 01/22/2021 FINDINGS: Tip of endotracheal tube projects 3.0 cm above carina. Feeding tube extends into abdomen. Normal heart size mediastinal contours. Persistent RIGHT basilar atelectasis with improved aeration at LEFT base since previous exam. No new infiltrate, pleural effusion, or pneumothorax. Bones demineralized. IMPRESSION: Persistent mild RIGHT basilar atelectasis. Improved aeration at LEFT lung base since previous exam. Electronically Signed   By: Lavonia Dana M.D.   On: 01/12/2021 12:03   CT HEAD WO CONTRAST (5MM)  Result Date: 01/14/2021 CLINICAL DATA:  Stroke, follow up EXAM: CT HEAD WITHOUT CONTRAST TECHNIQUE: Contiguous axial images were obtained from the base of the skull through the vertex without intravenous contrast. COMPARISON:  CT head January 07, 2021. FINDINGS: Brain: When accounting for differences in technique (different plane of imaging on axial imaging), similar size an intraparenchymal hemorrhage within the pons, measuring up to approximately 2.0 by 3.2 by 2.6 cm (similar when remeasured). Surrounding edema in the brainstem appears similar. Intraventricular hemorrhage in the fourth ventricle is decreased. Similar effacement of the fourth ventricle without progressive ventriculomegaly to suggest obstructive hydrocephalus. No evidence of interval acute large vascular territory infarct. Patchy white matter hypoattenuation, compatible with advanced chronic microvascular ischemic disease that appears similar to prior. No midline shift. Similar mild cerebral atrophy. Vascular: No hyperdense vessel  identified. Skull: No acute fracture. Sinuses/Orbits: Mucosal thickening of scattered paranasal sinuses, similar. No acute orbital findings. Other: Small bilateral mastoid effusions. IMPRESSION: 1. When accounting for differences in technique, similar size of a recent intraparenchymal hemorrhage within the pons, as detailed above. Similar surrounding brainstem edema. Intraventricular hemorrhage in the fourth ventricle is decreased. Similar effacement of the fourth ventricle without progressive ventriculomegaly to suggest obstructive hydrocephalus. 2. Similar advanced chronic microvascular ischemic disease. Electronically Signed   By: Margaretha Sheffield M.D.   On: 01/14/2021 16:24   CT HEAD WO CONTRAST (5MM)  Result Date: 01/07/2021 CLINICAL DATA:  Stroke, follow-up 6 hour stability scan. EXAM: CT HEAD WITHOUT CONTRAST TECHNIQUE: Contiguous axial images were obtained from the base of the skull through the vertex without intravenous contrast. COMPARISON:  Brain MRI 01/07/2021. head CT 01/22/2021. FINDINGS: Brain: Mild generalized cerebral atrophy. Stable size of an acute parenchymal hemorrhage within the pons, again measuring 2.8 x 2.1 cm in transaxial dimensions and 2.6 cm in craniocaudal dimension. As before, there is surrounding edema within the brainstem with mass effect and partial fourth ventricular effacement. Unchanged small volume extension of hemorrhage into the fourth ventricle and posterior fossa subarachnoid spaces. No evidence of obstructive hydrocephalus at this time. Stable supratentorial chronic small vessel ischemic disease. No demarcated cortical infarct. Vascular: No hyperdense vessel.  Atherosclerotic calcifications. Skull: Normal. Negative for fracture or focal lesion. Sinuses/Orbits: Visualized orbits show no acute finding. Mild mucosal thickening within the bilateral ethmoid, sphenoid and maxillary sinuses. IMPRESSION: Stable size of an acute parenchymal hemorrhage within the pons. As  before, there is surrounding edema within the brainstem with mass effect and partial effacement of the fourth ventricle. No evidence of obstructive hydrocephalus at this time. Also unchanged, there is small-volume  extension of hemorrhage into the fourth ventricle and posterior fossa subarachnoid spaces. Stable background cerebral atrophy and chronic supratentorial chronic small vessel ischemic disease. Electronically Signed   By: Kellie Simmering D.O.   On: 01/07/2021 11:42   CT Head Wo Contrast  Result Date: 01/16/2021 CLINICAL DATA:  Mental status change.  Found unresponsive. EXAM: CT HEAD WITHOUT CONTRAST TECHNIQUE: Contiguous axial images were obtained from the base of the skull through the vertex without intravenous contrast. COMPARISON:  None. FINDINGS: Brain: There is a hyperdense mass within the brainstem measuring 2.8 by 2.1 by 2.6 cm compatible with hemorrhagic infarct. There is surrounding low attenuation edema. Posterior extension of clot is identified into the fourth ventricle and the cisterna magna, image 70/2. decreased CSF space is noted at the cerebellar pontine angle. There is moderate patchy low-attenuation within the subcortical and periventricular white matter compatible with chronic microvascular disease. Prominence of the sulci identified compatible with brain atrophy. Vascular: No hyperdense vessel or unexpected calcification. Skull: Normal. Negative for fracture or focal lesion. Sinuses/Orbits: Mild mucosal thickening is noted involving the maxillary sinuses. Mastoid air cells are clear. Other: None. IMPRESSION: 1. Examination is positive for hemorrhagic infarct within the brainstem. 2. Posterior extension of clot into the fourth ventricle and the cisterna magna. 3. Chronic small vessel ischemic disease and brain atrophy. Critical Value/emergent results were called by telephone at the time of interpretation on 01/19/2021 at 2:41 pm to provider [facility] , who verbally acknowledged these  results. Electronically Signed   By: Kerby Moors M.D.   On: 01/19/2021 14:42   MR BRAIN WO CONTRAST  Result Date: 01/07/2021 CLINICAL DATA:  55 year old male with altered mental status, found unresponsive. Brainstem hemorrhage. Ophthalmoplegia. EXAM: MRI HEAD AND ORBITS WITHOUT CONTRAST TECHNIQUE: Multiplanar, multi-echo pulse sequences of the brain and surrounding structures were acquired without intravenous contrast. Multiplanar, multi-echo pulse sequences of the orbits and surrounding structures were acquired including fat saturation techniques, without intravenous contrast administration. COMPARISON:  Plain head CT 01/29/2021. FINDINGS: MRI HEAD FINDINGS Brain: Heterogeneous T2 and largely isointense T1 hemorrhage within the brainstem is expanding the pons and as seen by SWI tracks into the 4th ventricle. Estimated intra-axial hemorrhage size by MRI is 24 x 29 by 28 mm (AP by transverse by CC) for an estimated blood volume of 10 mL. Extensive pontine edema. On the dedicated orbit images edema is seen to track into the right medullary pyramid (series 8, image 7 of that exam). Edema also tracks cephalad into the midbrain greater on the right. Fourth ventricle size remains relatively normal. Punctate blood layering in the left occipital horn but no other lateral or 3rd intraventricular hemorrhage. No ventriculomegaly. No definite transependymal edema. Small volume of subarachnoid hemorrhage in the cisterna magna. SWI also demonstrates chronic microhemorrhages scattered in the cerebellum (especially on the left series 15, image 15) and bilateral deep gray matter nuclei (image 34. Occasional cerebral hemisphere microhemorrhages (image 35 on the left). Basilar cisterns remain patent.  No midline shift. DWI demonstrates no larger area of diffusion restriction. No chronic cortical encephalomalacia but moderate Patchy and confluent bilateral cerebral white matter T2 and FLAIR hyperintensity in a pattern suggestive  of chronic small vessel ischemia. Vascular: Major intracranial vascular flow voids are preserved. Skull and upper cervical spine: Normal bone marrow signal. Negative visible cervical spine. Other: Mastoids are clear. Grossly normal visible internal auditory structures. Intubated. Right nasoenteric tube in place. Fluid in the pharynx. MRI ORBITS FINDINGS Orbits: Normal suprasellar cistern and optic chiasm. Optic nerves  appear symmetric, within normal limits. No contrast administered. But there is no intraorbital fat stranding or inflammation identified. No intraorbital mass or mass effect identified. Globes appear normal. Unremarkable noncontrast cavernous sinus. Visualized sinuses: Scattered paranasal sinus fluid and mucosal thickening in the setting of intubation and right nasoenteric tube. Soft tissues: Negative visible deep soft tissue spaces of the face. IMPRESSION: 1. Acute brainstem hemorrhage centered at the pons with estimated intra-axial blood volume of 10 mL. Extensive pontine edema, which tracks associated edema which tracks into the midbrain right > left and into the right medullary pyramid. 2. Mild extension of hemorrhage into the 4th ventricle and basilar subarachnoid spaces. But no ventriculomegaly. No midline shift or impending herniation. 3. Underlying advanced chronic small vessel disease including multiple chronic microhemorrhages in the cerebellum and bilateral deep gray matter nuclei. Advanced bilateral cerebral white matter ischemia. 4. Negative noncontrast MRI appearance of the orbits. Electronically Signed   By: Genevie Ann M.D.   On: 01/07/2021 05:37   DG CHEST PORT 1 VIEW  Result Date: 01/03/2021 CLINICAL DATA:  Postop trach placement. EXAM: PORTABLE CHEST 1 VIEW COMPARISON:  Radiograph earlier today. FINDINGS: Removal of endotracheal tube with no tracheostomy tube in place. Tracheostomy tube tip projects over the clavicular heads. Persistent low lung volumes. Bibasilar atelectasis, with  confluent retrocardiac opacity. Stable heart size and mediastinal contours. No pneumothorax. There may be small pleural effusions. IMPRESSION: 1. Tracheostomy tube tip projects over the clavicular heads. 2. Persistent low lung volumes with bibasilar atelectasis and confluent retrocardiac opacity. Possible small pleural effusions. Electronically Signed   By: Keith Rake M.D.   On: 01/11/2021 19:59   DG Chest Port 1 View  Result Date: 01/13/2021 CLINICAL DATA:  ET tube present, respiratory failure EXAM: PORTABLE CHEST 1 VIEW COMPARISON:  Chest radiograph 01/13/2021 FINDINGS: The endotracheal tube tip is approximately 2.8 cm from the carina. The cardiomediastinal silhouette is stable. Linear opacity projecting over the lateral left base likely reflect subsegmental atelectasis. There is no other focal consolidation or pulmonary edema. There is no pleural effusion or pneumothorax. There is no acute osseous abnormality. IMPRESSION: 1. Endotracheal tube in satisfactory position. 2. Mild left basilar subsegmental atelectasis. Otherwise, no radiographic evidence of acute cardiopulmonary process. Electronically Signed   By: Valetta Mole M.D.   On: 01/19/2021 12:05   DG CHEST PORT 1 VIEW  Result Date: 01/13/2021 CLINICAL DATA:  Acute respiratory failure with hypoxia EXAM: PORTABLE CHEST 1 VIEW COMPARISON:  01/12/2021. FINDINGS: Endotracheal tube tip 3.8 cm above the carina. Enteric tube courses below the diaphragm in outside the field of view. Similar streaky right basilar opacities, compatible with atelectasis. No new consolidation. No visible pleural effusions or pneumothorax. Similar cardiomediastinal silhouette. IMPRESSION: Similar right basilar atelectasis.  No new acute abnormality. Electronically Signed   By: Margaretha Sheffield M.D.   On: 01/13/2021 07:21   DG CHEST PORT 1 VIEW  Result Date: 01/25/2021 CLINICAL DATA:  Central line placement EXAM: PORTABLE CHEST 1 VIEW COMPARISON:  01/22/2021 FINDINGS:  Endotracheal tube tip is about 4 cm superior to carina. Left IJ central venous catheter tip over the SVC. Esophageal tube tip below the diaphragm. No visible pneumothorax. Interval airspace disease at left base which may be due to atelectasis or aspiration. IMPRESSION: 1. Left IJ central venous catheter tip over the SVC. No pneumothorax 2. Interval airspace disease at left base which may be due to atelectasis or aspiration Electronically Signed   By: Donavan Foil M.D.   On: 01/24/2021 18:33  DG Chest Port 1 View  Result Date: 01/25/2021 CLINICAL DATA:  OG tube placement EXAM: PORTABLE CHEST 1 VIEW COMPARISON:  None. FINDINGS: Endotracheal tube with the tip 5.8 cm above the carina. Nasogastric tube coursing below the diaphragm with the tip excluded from the field of view. No focal consolidation. No pleural effusion or pneumothorax. Heart and mediastinal contours are unremarkable. No acute osseous abnormality. IMPRESSION: Endotracheal tube with the tip 5.8 cm above the carina. Nasogastric tube coursing below the diaphragm with the tip excluded from the field of view. Electronically Signed   By: Kathreen Devoid M.D.   On: 01/07/2021 17:18   DG Chest Port 1 View  Result Date: 01/01/2021 CLINICAL DATA:  A 55 year old male presents with sepsis now intubated. EXAM: PORTABLE CHEST 1 VIEW COMPARISON:  No recent comparisons, most recent prior is from October of 2013. FINDINGS: EKG leads project over the chest. A gastric tube courses through in off the field of the radiograph. Side port is below GE junction, tip is not visualized. Endotracheal tube at 2.5 cm from the carina. Cardiomediastinal contours are normal. RIGHT hemidiaphragm is elevated. This is slightly accentuated when compared to prior imaging but there is no sign of consolidation, effusion or pneumothorax. On limited assessment there is no acute skeletal process. Incidental note is made of what appears to be in hearing projecting over the RIGHT  supraclavicular region and therefore more likely external to the patient. IMPRESSION: Endotracheal tube is at 2.5 cm from the carina. No acute cardiopulmonary process. Gastric tube courses through off the field of the radiograph. And earring projects over the RIGHT supraclavicular region, may be anterior or posterior to the patient. Electronically Signed   By: Zetta Bills M.D.   On: 01/22/2021 14:07   DG Abd Portable 1V  Result Date: 01/11/2021 CLINICAL DATA:  Nasogastric tube placement EXAM: PORTABLE ABDOMEN - 1 VIEW COMPARISON:  None. FINDINGS: Enteric feeding tube is positioned below the diaphragm with tip in the vicinity of the pylorus. Nonobstructive pattern of included bowel gas. IMPRESSION: Enteric feeding tube is positioned below the diaphragm with tip in the vicinity of the pylorus. Consider advancement if post pyloric positioning is desired. Electronically Signed   By: Delanna Ahmadi M.D.   On: 01/11/2021 13:21   DG Abd Portable 1V  Result Date: 01/09/2021 CLINICAL DATA:  NG tube placement EXAM: PORTABLE ABDOMEN - 1 VIEW COMPARISON:  KUB 01/02/2021 FINDINGS: The enteric catheter tip projects over the expected location of the pylorus/first portion of the duodenum. The side hole projects over the distal stomach. There is a nonobstructive bowel gas pattern to the level imaged. The lung bases are clear. IMPRESSION: Enteric catheter in appropriate position. Electronically Signed   By: Valetta Mole M.D.   On: 01/09/2021 08:49   DG Abd Portable 1V  Result Date: 01/08/2021 CLINICAL DATA:  NG tube EXAM: PORTABLE ABDOMEN - 1 VIEW COMPARISON:  None. FINDINGS: Esophageal tube tip overlies the distal stomach. Upper gas pattern is unremarkable IMPRESSION: Esophageal tube tip overlies the distal stomach Electronically Signed   By: Donavan Foil M.D.   On: 01/11/2021 18:34   ECHOCARDIOGRAM COMPLETE  Result Date: 01/07/2021    ECHOCARDIOGRAM REPORT   Patient Name:   CHRISS MANNAN Date of Exam:  01/07/2021 Medical Rec #:  [id number]        Height:       74.0 in Accession #:    [id number]       Weight:  155.9 lb Date of Birth:  February 13, 1966         BSA:          1.955 m Patient Age:    83 years         BP:           114/80 mmHg Patient Gender: M                HR:           92 bpm. Exam Location:  Inpatient Procedure: 2D Echo, Cardiac Doppler, Color Doppler and 3D Echo Indications:    Stroke I63.9  History:        Patient has prior history of Echocardiogram examinations, most                 recent 06/16/2015. CHF; Risk Factors:Hypertension. Chronic kidney                 disease.  Sonographer:    Darlina Sicilian RDCS Referring Phys: [id number] [place]  1. Left ventricular ejection fraction, by estimation, is 55 to 60%. The left ventricle has normal function. The left ventricle demonstrates regional wall motion abnormalities. Basal inferior akinesis.There is moderate asymmetric left ventricular hypertrophy of the basal-septal segment. Left ventricular diastolic parameters are consistent with Grade I diastolic dysfunction (impaired relaxation).  2. Right ventricular systolic function is normal. The right ventricular size is normal. Tricuspid regurgitation signal is inadequate for assessing PA pressure.  3. The mitral valve is normal in structure. No evidence of mitral valve regurgitation.  4. The aortic valve was not well visualized. Aortic valve regurgitation is mild. No aortic stenosis is present.  5. The inferior vena cava is normal in size with greater than 50% respiratory variability, suggesting right atrial pressure of 3 mmHg. FINDINGS  Left Ventricle: Left ventricular ejection fraction, by estimation, is 55 to 60%. The left ventricle has normal function. The left ventricle demonstrates regional wall motion abnormalities. The left ventricular internal cavity size was normal in size. There is moderate asymmetric left ventricular hypertrophy of the basal-septal segment. Left ventricular  diastolic parameters are consistent with Grade I diastolic dysfunction (impaired relaxation). Right Ventricle: The right ventricular size is normal. No increase in right ventricular wall thickness. Right ventricular systolic function is normal. Tricuspid regurgitation signal is inadequate for assessing PA pressure. Left Atrium: Left atrial size was normal in size. Right Atrium: Right atrial size was normal in size. Pericardium: There is no evidence of pericardial effusion. Mitral Valve: The mitral valve is normal in structure. No evidence of mitral valve regurgitation. Tricuspid Valve: The tricuspid valve is normal in structure. Tricuspid valve regurgitation is trivial. Aortic Valve: The aortic valve was not well visualized. Aortic valve regurgitation is mild. No aortic stenosis is present. Pulmonic Valve: The pulmonic valve was not well visualized. Pulmonic valve regurgitation is not visualized. Aorta: The aortic root is normal in size and structure. Venous: The inferior vena cava is normal in size with greater than 50% respiratory variability, suggesting right atrial pressure of 3 mmHg. IAS/Shunts: The interatrial septum was not well visualized.  LEFT VENTRICLE PLAX 2D LVIDd:         5.80 cm   Diastology LVIDs:         3.70 cm   LV e' medial:    6.64 cm/s LV PW:         0.95 cm   LV E/e' medial:  5.8 LV IVS:  0.95 cm   LV e' lateral:   7.62 cm/s LVOT diam:     2.30 cm   LV E/e' lateral: 5.1 LV SV:         66 LV SV Index:   34 LVOT Area:     4.15 cm                           3D Volume EF:                          3D EF:        50 %                          LV EDV:       109 ml                          LV ESV:       54 ml                          LV SV:        55 ml RIGHT VENTRICLE RV S prime:     18.30 cm/s TAPSE (M-mode): 1.7 cm LEFT ATRIUM             Index        RIGHT ATRIUM          Index LA diam:        3.10 cm 1.59 cm/m   RA Area:     7.93 cm LA Vol (A2C):   26.9 ml 13.76 ml/m  RA Volume:   10.80  ml 5.52 ml/m LA Vol (A4C):   25.7 ml 13.15 ml/m LA Biplane Vol: 25.6 ml 13.10 ml/m  AORTIC VALVE LVOT Vmax:   88.80 cm/s LVOT Vmean:  61.000 cm/s LVOT VTI:    0.159 m  AORTA Ao Root diam: 3.70 cm MITRAL VALVE MV Area (PHT): 3.42 cm    SHUNTS MV Decel Time: 222 msec    Systemic VTI:  0.16 m MV E velocity: 38.60 cm/s  Systemic Diam: 2.30 cm MV A velocity: 56.60 cm/s MV E/A ratio:  0.68 Oswaldo Milian MD Electronically signed by Oswaldo Milian MD Signature Date/Time: 01/07/2021/4:27:39 PM    Final    MR ORBITS WO CONTRAST  Result Date: 01/07/2021 CLINICAL DATA:  55 year old male with altered mental status, found unresponsive. Brainstem hemorrhage. Ophthalmoplegia. EXAM: MRI HEAD AND ORBITS WITHOUT CONTRAST TECHNIQUE: Multiplanar, multi-echo pulse sequences of the brain and surrounding structures were acquired without intravenous contrast. Multiplanar, multi-echo pulse sequences of the orbits and surrounding structures were acquired including fat saturation techniques, without intravenous contrast administration. COMPARISON:  Plain head CT 01/12/2021. FINDINGS: MRI HEAD FINDINGS Brain: Heterogeneous T2 and largely isointense T1 hemorrhage within the brainstem is expanding the pons and as seen by SWI tracks into the 4th ventricle. Estimated intra-axial hemorrhage size by MRI is 24 x 29 by 28 mm (AP by transverse by CC) for an estimated blood volume of 10 mL. Extensive pontine edema. On the dedicated orbit images edema is seen to track into the right medullary pyramid (series 8, image 7 of that exam). Edema also tracks cephalad into the midbrain greater on the right. Fourth ventricle size remains relatively normal. Punctate blood layering in the left occipital horn but no other lateral  or 3rd intraventricular hemorrhage. No ventriculomegaly. No definite transependymal edema. Small volume of subarachnoid hemorrhage in the cisterna magna. SWI also demonstrates chronic microhemorrhages scattered in the  cerebellum (especially on the left series 15, image 15) and bilateral deep gray matter nuclei (image 34. Occasional cerebral hemisphere microhemorrhages (image 35 on the left). Basilar cisterns remain patent.  No midline shift. DWI demonstrates no larger area of diffusion restriction. No chronic cortical encephalomalacia but moderate Patchy and confluent bilateral cerebral white matter T2 and FLAIR hyperintensity in a pattern suggestive of chronic small vessel ischemia. Vascular: Major intracranial vascular flow voids are preserved. Skull and upper cervical spine: Normal bone marrow signal. Negative visible cervical spine. Other: Mastoids are clear. Grossly normal visible internal auditory structures. Intubated. Right nasoenteric tube in place. Fluid in the pharynx. MRI ORBITS FINDINGS Orbits: Normal suprasellar cistern and optic chiasm. Optic nerves appear symmetric, within normal limits. No contrast administered. But there is no intraorbital fat stranding or inflammation identified. No intraorbital mass or mass effect identified. Globes appear normal. Unremarkable noncontrast cavernous sinus. Visualized sinuses: Scattered paranasal sinus fluid and mucosal thickening in the setting of intubation and right nasoenteric tube. Soft tissues: Negative visible deep soft tissue spaces of the face. IMPRESSION: 1. Acute brainstem hemorrhage centered at the pons with estimated intra-axial blood volume of 10 mL. Extensive pontine edema, which tracks associated edema which tracks into the midbrain right > left and into the right medullary pyramid. 2. Mild extension of hemorrhage into the 4th ventricle and basilar subarachnoid spaces. But no ventriculomegaly. No midline shift or impending herniation. 3. Underlying advanced chronic small vessel disease including multiple chronic microhemorrhages in the cerebellum and bilateral deep gray matter nuclei. Advanced bilateral cerebral white matter ischemia. 4. Negative noncontrast MRI  appearance of the orbits. Electronically Signed   By: Genevie Ann M.D.   On: 01/07/2021 05:37      PHYSICAL EXAM  Temp:  [96.4 F (35.8 C)-98.1 F (36.7 C)] 98.1 F (36.7 C) (10/27 0800) Pulse Rate:  [74-90] 90 (10/27 1132) Resp:  [12-31] 23 (10/27 1132) BP: (112-125)/(74-91) 115/79 (10/27 1132) SpO2:  [84 %-100 %] 92 % (10/27 1132) FiO2 (%):  [40 %] 40 % (10/27 1132)  General - Well nourished, well developed, on trach and vent, lethargic.   Ophthalmologic - fundi not visualized due to noncooperation.   Cardiovascular - Regular rate and rhythm.   Neuro - lethargic, but left eye spontaneously open, right eye status post temporary tarsorrhaphy. Able to follow  simple midline commands and peripheral commands on the right hand and foot. Left eye in mid position, blinking to visual threat on the left eye, horizontal gaze palsy, but able to have upward and downward gaze. Corneal reflex present on the left, on trach with copious secretions, mild tongue protrusion on request.  left facial droop. RUE barely against gravity, however follow commands on the right hand. RLE withdraw to pain, follow commands on the right foot, but hemiplegia on the left. Bilaterally no babinski. Sensation, coordination not cooperative and gait not tested.     ASSESSMENT/PLAN Timothy Good is a 55 y.o. male with a past medical history significant for uncontrolled hypertension, CKD stage IV, heart failure, hyperlipidemia, daily drinking, medication nonadherence, iron deficiency anemia/anemia of chronic disease, presenting with a pontine hemorrhage. ICH Score: 3.    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  CT head right pontine hemorrhage with midbrain and medulla extension, IVH at fourth  ventricle MRI  Acute brainstem hemorrhage centered at the pons. Extensive pontine edema, which tracks associated edema which tracks into the midbrain right > left and into  the right medullary pyramid. Mild extension of hemorrhage into the 4th ventricle and basilar subarachnoid spaces. But no ventriculomegaly. No midline shift or impending herniation. Negative noncontrast MRI appearance of the orbits. CT repeat stable hematoma, no hydrocephalus Consider MRA to rule out AVM or aneurysm once hematoma resolves if aggressive care. 2D Echo EF 55 to 60%.  LDL 75 HgbA1c 5.4 VTE prophylaxis - Heparin subcu Not on Anticoagulant or antiplatelet prior to admission, now on no antithrombotics due to [place] Therapy recommendations:  SNF Disposition: Pending, palliative care on board, family yet to decide further [place]  Cerebral Edema Neurosurgery consulted, no acute intervention recommended at this time. Off 3% saline allow sodium to trend down with daily check   Na 143->141->136  Acute respiratory failure Probable aspiration pneumonia Remains intubated, appreciate CCM management First on Unasyn->Rocephin->off Tmax 100.7->afebrile now Resp culture: few gram positive cocci PEG 10/18 with Trauma Trach 10/21 with ENT CCM on bard Still has copious secretions post trach    Hypertensive emergency Home meds: none Stable now BP goal less than 160  Currently on Norvasc 10, Coreg 25, hydralazine 100 Q8 Long-term BP goal normotensive  Hyperlipidemia Home meds:  None LDL  75, almost at goal < 70 May consider statin at discharge  AKI on CKD Ischemic ATN Anemia  Creatinine 3.65-3.80-5.2-6.27-5.69-5.46-5.44-5.60-5.53->5.90->5.93 BUN continues to elevate 200->205 Hb 8.2->7.6->7.7->7.3->7.1->6.6->PRBC->8.1->7.6->7.4 On tube feeding and free water  Urine adequate No acute need for dialysis Nephrology on board - not candidate for dialysis Palliative care on board, family pondering on [place]  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  Eulas Post Cipro eyedrop during the day and erythromycin ointment nightly Continue Vit C 2g and docxy 100 bid Appreciate ophthal Dr. Roda Shutters help  ETOH use disorder with dependence Daily use of excessive liquor per family (?1/2 bottle vodka)  Thiamine, folate and MVI on board Monitor magnesium, 2.2->2.2->2.1 Vitamin B12 -213 Monitor s/s of withdrawal, need for CIWA   Dysphagia Continue tube feeding and free water  Speech on board CT Chest Abd Pelvis: generalized gastric wall thickening, with associated fat stranding in the gastrohepatic ligament.Findings are nonspecific and could be due to noninflammatory edema,gastritis, peptic ulcer disease or neoplasm.  PEG done 10/18  Tobacco abuse Current smoker Smoking cessation counseling will be provided  Other Stroke Risk Factors Current Cigarette smoker, will be advised to stop smoking ETOH use disorder alcohol level <10, advised to drink no more than 2 drink(s) a day  Other Active Problems Multinodular goiter  Hospital day # 20   Rosalin Hawking, MD PhD Stroke Neurology 01/26/2021 12:47 PM   To contact Stroke Continuity provider, please refer to [email or web address] After hours, contact General Neurology

## 2021-01-26 NOTE — Progress Notes (Signed)
PROGRESS NOTE    Timothy Good  ZOX:[id number] DOB: 1965-07-03 DOA: 01/07/2021 PCP: Raiford Simmonds., PA-C    Brief Narrative:  55 year old gentleman with multiple medical issues who was found down at home and found by police on a welfare visit.  Has history of uncontrolled hypertension, CKD stage IV, heart failure, hyperlipidemia, alcohol use, medication nonadherence, iron-deficiency anemia.  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 10/7, admitted to ICU and intubated.  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 10/21, tracheostomy and liberation from the ventilator.  PEG tube insertion. 10/22, seen by palliative changed to DNR.  DNR with no escalation of care and no dialysis. 10/25, transferred to [facility] care.  Remains on very poor clinical status.   Assessment & Plan:   Active Problems:   Pontine hemorrhage (HCC)   Malnutrition of moderate degree   Acute respiratory failure with hypoxia (HCC)   Hypertension   Endotracheally intubated  Intracranial hemorrhage, right acute pontine hemorrhage due to uncontrolled severe hypertension: CT head on admission with right pontine hemorrhage with midbrain and middle extension MRI of the brain with acute brainstem hemorrhage  Repeat CT head is stable 2D echocardiogram with normal EF LDL 75 Hemoglobin A1c 5.4 Complete left hemiplegia, poor prognosis, therapy recommended skilled nursing facility placement. Patient was on 3% saline that was taken off and now stabilizing sodium levels. Continue to work with physical therapy and Occupational Therapy.  Acute hypoxemic respiratory failure secondary to pontine hemorrhage.  Ventilator dependent and now status post tracheostomy on trach collar: Currently remains off ventilator, able to maintain on trach collar. ENT following as needed.  Sutures removed. Continue aggressive  pulmonary therapy/breathing exercises and respiratory care.  Family decided not to go back on ventilator.  Acute kidney injury on chronic kidney disease stage IV, ischemic ATN: Decided no dialysis now or in the future as per goal of care. Followed by nephrology. Currently on free water flush through the tube.  Potassium is stable.  BUN continues to rise.  Urine output 1500 mL last 24 hours.  May be developing uremic symptoms.  Hypertensive emergency: Blood pressures are low normal now.  All his antihypertensives are on hold.  He is on torsemide.  He is on as needed hydralazine and labetalol with goal of systolic blood pressure less than 160.  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 Eulas Post.  Cipro eyedrops and erythromycin eyedrops.  On oral doxycycline.  Dysphagia: On PEG tube feeding.  Goal of care:  Palliative closely following up with family.  Patient currently stabilized but remains in very poor clinical status.  10/26, call made to patient's daughter and son on phone.  Discussed in detail and updated about patient's clinical status.  After tracheostomy and PEG tube feeding, he has been stabilized but remains in poor clinical status with unknown recovery and rehab. Daughter [name] noted that every time they visit him he writes stop with his right hand.  They were not sure whether he is comprehending complex decision making or not.  Family understand that his kidneys may give out and ultimately he may die. We discussed whether they agree that he is ready for comfort care and hospice, family is having a hard time now that he is already gone through artificial nutrition and tracheostomy.  They would like to see how he does.  They would like to see  him gradually improving, however if he decompensates or deteriorates they will be agreeable to have hospice discussion. Family also asked whether it is possible to transfer him to  other hospital for second opinion, I explained to them that he is on maximum care already and optimized with artificial nutrition and tracheostomy.  There is no additional intervention that he needs to go to another facility. Family agreeable.  Plan is to continue current level of care.  If any deterioration, hospice.  If remains stable, skilled nursing facility.   DVT prophylaxis: heparin injection 5,000 Units Start: 01/15/21 0100 SCD's Start: 01/02/2021 1701   Code Status: DNR Family Communication: None today. Disposition Plan: Status is: Inpatient  Remains inpatient appropriate because: Unsafe discharge plan.  Not a stable to transfer.          Consultants:  ENT Critical care Palliative care  Procedures:  Right lead tarsorrhaphy Tracheostomy PEG tube placement  Antimicrobials:  Ciprofloxacin eyedrops.  Doxycycline by mouth.   Subjective: Patient seen and examined.  No new events.  Tries to follow some commands with right hands.  He was less interactive today with delayed response to right-sided stimulation.  Objective: Vitals:   01/26/21 0700 01/26/21 0734 01/26/21 0800 01/26/21 1132  BP:  123/84 115/79 115/79  Pulse: 86 89 89 90  Resp: (!) 22 (!) 25 (!) 28 (!) 23  Temp:   98.1 F (36.7 C)   TempSrc:  Oral Axillary   SpO2: 93% 94% 93% 92%  Weight:      Height:        Intake/Output Summary (Last 24 hours) at 01/26/2021 1205 Last data filed at 01/26/2021 0700 Gross per 24 hour  Intake 1475 ml  Output 1650 ml  Net -175 ml   Filed Weights   01/21/21 0500 01/22/21 0147 01/23/21 0405  Weight: 73.8 kg 76 kg 75.4 kg    Examination:  General exam: Sick looking.  Anxious looking. Right eye lead are sutured together. He has tracheostomy with trach collar, currently on 10 L oxygen. He is alert and awake on his stimulation, follows commands with delay on the right side.  Left side is flaccid. Respiratory system: Conducted airway sounds. SpO2: 92 % O2 Flow  Rate (L/min): 10 L/min FiO2 (%): 40 %  Cardiovascular system: S1 & S2 heard, RRR.  Gastrointestinal system: Soft.  Nontender.  Bowel sounds present.  PEG tube in place.    Data Reviewed: I have personally reviewed following labs and imaging studies  CBC: Recent Labs  Lab 01/22/21 0140 01/23/21 0309 01/24/21 0317 01/25/21 0500 01/26/21 0413  WBC 11.8* 11.9* 11.5* 11.4* 11.3*  HGB 7.3* 7.2* 7.2* 7.4* 7.4*  HCT 23.0* 23.2* 23.0* 24.3* 24.2*  MCV 88.5 90.3 90.6 91.7 92.4  PLT 390 423* 466* 433* 945*   Basic Metabolic Panel: Recent Labs  Lab 01/19/2021 0332 01/21/21 0356 01/22/21 0140 01/23/21 0309 01/24/21 0317 01/25/21 0500 01/26/21 0413  NA 140   < > 139 139 141 145 145  K 4.9   < > 4.8 4.3 3.9 3.9 4.0  CL 103   < > 99 96* 99 102 104  CO2 21*   < > _0 GLUCOSE 94   < > 108* 96 104* 109* 108*  BUN 156*   < > 172* 179* 187* 200* 205*  CREATININE 6.47*   < > 6.23* 6.24* 6.22* 6.21* 5.93*  CALCIUM 9.9   < > 9.8 9.8 10.0 10.3 10.3  PHOS 8.2*  --   --   --   --   --  8.6*   < > = values in this interval not displayed.   GFR: Estimated Creatinine Clearance: 15.4 mL/min (A) (by C-G formula based on SCr of 5.93 mg/dL (H)). Liver Function Tests: Recent Labs  Lab 01/29/2021 0332 01/26/21 0413  AST  --  36  ALT  --  99*  ALKPHOS  --  98  BILITOT  --  0.8  PROT  --  8.0  ALBUMIN 1.9* 2.2*  2.2*   No results for input(s): LIPASE, AMYLASE in the last 168 hours. No results for input(s): AMMONIA in the last 168 hours. Coagulation Profile: Recent Labs  Lab 01/30/2021 0332  INR 1.2   Cardiac Enzymes: No results for input(s): CKTOTAL, CKMB, CKMBINDEX, TROPONINI in the last 168 hours. BNP (last 3 results) No results for input(s): PROBNP in the last 8760 hours. HbA1C: No results for input(s): HGBA1C in the last 72 hours. CBG: Recent Labs  Lab 01/25/21 1553 01/25/21 1957 01/25/21 2334 01/26/21 0400 01/26/21 0827  GLUCAP 95 100* 128* 104* 94   Lipid  Profile: No results for input(s): CHOL, HDL, LDLCALC, TRIG, CHOLHDL, LDLDIRECT in the last 72 hours. Thyroid Function Tests: No results for input(s): TSH, T4TOTAL, FREET4, T3FREE, THYROIDAB in the last 72 hours. Anemia Panel: No results for input(s): VITAMINB12, FOLATE, FERRITIN, TIBC, IRON, RETICCTPCT in the last 72 hours. Sepsis Labs: No results for input(s): PROCALCITON, LATICACIDVEN in the last 168 hours.  Recent Results (from the past 240 hour(s))  Surgical PCR screen     Status: None   Collection Time: 01/05/2021  4:54 AM   Specimen: Nasal Mucosa; Nasal Swab  Result Value Ref Range Status   MRSA, PCR NEGATIVE NEGATIVE Final   Staphylococcus aureus NEGATIVE NEGATIVE Final    Comment: (NOTE) The Xpert SA Assay (FDA approved for NASAL specimens in patients 60 years of age and older), is one component of a comprehensive surveillance program. It is not intended to diagnose infection nor to guide or monitor treatment. Performed at [facility], [address] [place], [place] [address]          Radiology Studies: No results found.      Scheduled Meds:  vitamin C  2,000 mg Per Tube Daily   chlorhexidine gluconate (MEDLINE KIT)  15 mL Mouth Rinse BID   Chlorhexidine Gluconate Cloth  6 each Topical Daily   ciprofloxacin  2 drop Right Eye Q4H while awake   doxycycline  100 mg Per Tube Q12H   erythromycin   Right Eye Q6H   feeding supplement (PROSource TF)  45 mL Per Tube TID   free water  100 mL Per Tube Q6H   heparin injection (subcutaneous)  5,000 Units Subcutaneous Q8H   mouth rinse  15 mL Mouth Rinse 10 times per day   pantoprazole sodium  40 mg Per Tube Daily   sodium bicarbonate  650 mg Per Tube BID   thiamine  100 mg Per Tube Daily   Continuous Infusions:  sodium chloride Stopped (01/14/21 1454)   feeding supplement (NEPRO CARB STEADY) 45 mL/hr at 01/25/21 0500     LOS: 20 days    Time spent: 32 minutes    Barb Merino, MD [facility] Pager [phone]

## 2021-01-26 NOTE — Progress Notes (Signed)
Family communication,  Called and updated patient's daughter [name] about patient's status.  He is looking to be more tired today.  Otherwise clinical status remains the same. Patient has expressed to stop treatment to them individually. Patient's daughter Lorelee Market, her brother, patient's sister and patient's partner would like to visit him together and try to elicit from him about his wishes.  Plan: Patient is critically ill.  Will allow unrestricted visitor policy today to discuss end-of-life issues.

## 2021-01-27 DIAGNOSIS — J9601 Acute respiratory failure with hypoxia: Secondary | ICD-10-CM | POA: Diagnosis not present

## 2021-01-27 DIAGNOSIS — I613 Nontraumatic intracerebral hemorrhage in brain stem: Secondary | ICD-10-CM | POA: Diagnosis not present

## 2021-01-27 DIAGNOSIS — Z430 Encounter for attention to tracheostomy: Secondary | ICD-10-CM

## 2021-01-27 DIAGNOSIS — I161 Hypertensive emergency: Secondary | ICD-10-CM | POA: Diagnosis not present

## 2021-01-27 LAB — RENAL FUNCTION PANEL
Albumin: 2.1 g/dL — ABNORMAL LOW (ref 3.5–5.0)
Anion gap: 17 — ABNORMAL HIGH (ref 5–15)
BUN: 219 mg/dL — ABNORMAL HIGH (ref 6–20)
CO2: 24 mmol/L (ref 22–32)
Calcium: 10.2 mg/dL (ref 8.9–10.3)
Chloride: 108 mmol/L (ref 98–111)
Creatinine, Ser: 6.68 mg/dL — ABNORMAL HIGH (ref 0.61–1.24)
GFR, Estimated: 9 mL/min — ABNORMAL LOW
Glucose, Bld: 108 mg/dL — ABNORMAL HIGH (ref 70–99)
Phosphorus: 7.7 mg/dL — ABNORMAL HIGH (ref 2.5–4.6)
Potassium: 3.7 mmol/L (ref 3.5–5.1)
Sodium: 149 mmol/L — ABNORMAL HIGH (ref 135–145)

## 2021-01-27 LAB — CBC
HCT: 26 % — ABNORMAL LOW (ref 39.0–52.0)
Hemoglobin: 7.8 g/dL — ABNORMAL LOW (ref 13.0–17.0)
MCH: 28.3 pg (ref 26.0–34.0)
MCHC: 30 g/dL (ref 30.0–36.0)
MCV: 94.2 fL (ref 80.0–100.0)
Platelets: 497 K/uL — ABNORMAL HIGH (ref 150–400)
RBC: 2.76 MIL/uL — ABNORMAL LOW (ref 4.22–5.81)
RDW: 17.1 % — ABNORMAL HIGH (ref 11.5–15.5)
WBC: 11.2 K/uL — ABNORMAL HIGH (ref 4.0–10.5)
nRBC: 0 % (ref 0.0–0.2)

## 2021-01-27 LAB — GLUCOSE, CAPILLARY
Glucose-Capillary: 118 mg/dL — ABNORMAL HIGH (ref 70–99)
Glucose-Capillary: 115 mg/dL — ABNORMAL HIGH (ref 70–99)
Glucose-Capillary: 111 mg/dL — ABNORMAL HIGH (ref 70–99)

## 2021-01-27 NOTE — Progress Notes (Signed)
PROGRESS NOTE    Timothy Good  QXI:[id number] DOB: 09-22-1965 DOA: 01/12/2021 PCP: Raiford Simmonds., PA-C    Brief Narrative:  55 year old gentleman with multiple medical issues who was found down at home and found by police on a welfare visit.  Has history of uncontrolled hypertension, CKD stage IV, heart failure, hyperlipidemia, alcohol use, medication nonadherence, iron-deficiency anemia.  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 10/7, admitted to ICU and intubated.  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 10/21, tracheostomy and liberation from the ventilator.  PEG tube insertion. 10/22, seen by palliative changed to DNR.  DNR with no escalation of care and no dialysis. 10/25, transferred to [facility] care.  Remains on very poor clinical status.   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 Unable to participate in any kind of rehab today.  Acute hypoxemic respiratory failure secondary to pontine hemorrhage.  Ventilator dependent and now status post tracheostomy on trach collar: Currently remains off ventilator, able to maintain on trach collar. ENT following as needed.  Sutures removed. Continue aggressive pulmonary therapy/breathing exercises and respiratory care.  Family decided not to go back on ventilator.  Acute kidney injury  on chronic kidney disease stage IV, ischemic ATN: Decided no dialysis now or in the future as per goal of care. Followed by nephrology. Currently on free water flush through the tube.  Potassium is stable.  BUN continues to rise.   Still making some urine.  BUN is 235.  Hypertensive emergency: Blood pressures are low normal now.  All his antihypertensives are on hold.  He is on torsemide.  He is on as needed hydralazine and labetalol with goal of systolic blood pressure less than 160.  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 Eulas Post.  Cipro eyedrops and erythromycin eyedrops.  On oral doxycycline.  Dysphagia: On PEG tube feeding.  Goal of care:  DNR/DNI.  No escalation of care.  No ventilator.  No hemodialysis. All local family members including daughter, son, partner and patient's sister visited patient on 10/27 and they are in agreement that he is not in a good shape and they agree for comfort care. One of his sister who will be able to visit him tomorrow from [place] and they will probably decide about comfort care for him. Family requested to continue care until sister could visit tomorrow. I will do family meeting tomorrow after sisters visit and offer comfort care and hospice to them.   DVT prophylaxis: heparin injection 5,000 Units Start: 01/15/21 0100 SCD's Start: 01/21/2021 1701   Code Status: DNR Family Communication: Daughter and partner on the phone. Disposition Plan: Status is: Inpatient  Remains inpatient appropriate because: Unsafe discharge plan.  Not a stable to transfer.   Can go to progressive bed.        Consultants:  ENT Critical care Palliative care  Procedures:  Right lead tarsorrhaphy Tracheostomy  PEG tube placement  Antimicrobials:  Ciprofloxacin eyedrops.  Doxycycline by mouth.   Subjective: Patient seen and examined.  No new events.  More lethargic and  tired.  Objective: Vitals:   01/27/21 0700 01/27/21 0756 01/27/21 0800 01/27/21 1000  BP:  117/83 (!) 141/85 111/76  Pulse: (!) 106 (!) 103 (!) 107 96  Resp: 17 (!) 25 (!) 23 (!) 26  Temp:   97.8 F (36.6 C)   TempSrc:   Oral   SpO2: 94% 91% 94% 92%  Weight:      Height:        Intake/Output Summary (Last 24 hours) at 01/27/2021 1017 Last data filed at 01/27/2021 1000 Gross per 24 hour  Intake 1675 ml  Output 1200 ml  Net 475 ml   Filed Weights   01/22/21 0147 01/23/21 0405 01/27/21 0400  Weight: 76 kg 75.4 kg 73.1 kg    Examination:  General exam: Sick looking.  Anxious on his stimulation. Right eye lead are sutured together. He has tracheostomy with trach collar, currently on 10 L oxygen. He is alert and awake on his stimulation, follows commands with delay on the right side.  Left side is flaccid. Generalized anasarca. Respiratory system: Conducted airway sounds. SpO2: 92 % O2 Flow Rate (L/min): 10 L/min FiO2 (%): 60 %  Cardiovascular system: S1 & S2 heard, RRR.  Gastrointestinal system: Soft.  Nontender.  Bowel sounds present.  PEG tube in place.    Data Reviewed: I have personally reviewed following labs and imaging studies  CBC: Recent Labs  Lab 01/23/21 0309 01/24/21 0317 01/25/21 0500 01/26/21 0413 01/27/21 0333  WBC 11.9* 11.5* 11.4* 11.3* 11.2*  HGB 7.2* 7.2* 7.4* 7.4* 7.8*  HCT 23.2* 23.0* 24.3* 24.2* 26.0*  MCV 90.3 90.6 91.7 92.4 94.2  PLT 423* 466* 433* 484* 500*   Basic Metabolic Panel: Recent Labs  Lab 01/23/21 0309 01/24/21 0317 01/25/21 0500 01/26/21 0413 01/27/21 0333  NA 139 141 145 145 149*  K 4.3 3.9 3.9 4.0 3.7  CL 96* 99 102 104 108  CO2 $Re'24 26 26 23 24  'PIG$ GLUCOSE 96 104* 109* 108* 108*  BUN 179* 187* 200* 205* 219*  CREATININE 6.24* 6.22* 6.21* 5.93* 6.68*  CALCIUM 9.8 10.0 10.3 10.3 10.2  PHOS  --   --   --  8.6* 7.7*   GFR: Estimated Creatinine Clearance: 13.2 mL/min (A) (by C-G formula based on SCr of 6.68 mg/dL  (H)). Liver Function Tests: Recent Labs  Lab 01/26/21 0413 01/27/21 0333  AST 36  --   ALT 99*  --   ALKPHOS 98  --   BILITOT 0.8  --   PROT 8.0  --   ALBUMIN 2.2*  2.2* 2.1*   No results for input(s): LIPASE, AMYLASE in the last 168 hours. No results for input(s): AMMONIA in the last 168 hours. Coagulation Profile: No results for input(s): INR, PROTIME in the last 168 hours.  Cardiac Enzymes: No results for input(s): CKTOTAL, CKMB, CKMBINDEX, TROPONINI in the last 168 hours. BNP (last 3 results) No results for input(s): PROBNP in the last 8760 hours. HbA1C: No results for input(s): HGBA1C in the last 72 hours. CBG: Recent Labs  Lab 01/26/21 0400 01/26/21 0827 01/26/21 1233 01/26/21 1621 01/26/21 2333  GLUCAP 104* 94 113* 115* 108*   Lipid Profile: No results for input(s): CHOL, HDL, LDLCALC, TRIG, CHOLHDL, LDLDIRECT in the last 72 hours. Thyroid Function Tests: No results for input(s): TSH, T4TOTAL, FREET4, T3FREE, THYROIDAB in the  last 72 hours. Anemia Panel: No results for input(s): VITAMINB12, FOLATE, FERRITIN, TIBC, IRON, RETICCTPCT in the last 72 hours. Sepsis Labs: No results for input(s): PROCALCITON, LATICACIDVEN in the last 168 hours.  Recent Results (from the past 240 hour(s))  Surgical PCR screen     Status: None   Collection Time: 01/23/2021  4:54 AM   Specimen: Nasal Mucosa; Nasal Swab  Result Value Ref Range Status   MRSA, PCR NEGATIVE NEGATIVE Final   Staphylococcus aureus NEGATIVE NEGATIVE Final    Comment: (NOTE) The Xpert SA Assay (FDA approved for NASAL specimens in patients 36 years of age and older), is one component of a comprehensive surveillance program. It is not intended to diagnose infection nor to guide or monitor treatment. Performed at [facility], [address], [place], [place] [address]          Radiology Studies: No results found.      Scheduled Meds:  vitamin C  2,000 mg Per Tube Daily   chlorhexidine  gluconate (MEDLINE KIT)  15 mL Mouth Rinse BID   Chlorhexidine Gluconate Cloth  6 each Topical Daily   ciprofloxacin  2 drop Right Eye Q4H while awake   doxycycline  100 mg Per Tube Q12H   erythromycin   Right Eye Q6H   feeding supplement (PROSource TF)  45 mL Per Tube TID   free water  100 mL Per Tube Q6H   heparin injection (subcutaneous)  5,000 Units Subcutaneous Q8H   mouth rinse  15 mL Mouth Rinse 10 times per day   pantoprazole sodium  40 mg Per Tube Daily   sodium bicarbonate  650 mg Per Tube BID   thiamine  100 mg Per Tube Daily   Continuous Infusions:  sodium chloride Stopped (01/14/21 1454)   feeding supplement (NEPRO CARB STEADY) 1,000 mL (01/26/21 1702)     LOS: 21 days    Time spent: 35 minutes    Barb Merino, MD [facility] Pager [phone]

## 2021-01-27 NOTE — Progress Notes (Signed)
STROKE TEAM PROGRESS NOTE   INTERVAL HISTORY No family at the bedside. Pt lying in bed, on trach, no significant neuro change. Still lethargic, open eyes on voice, follows simple commands on the right but continues to be lethargic with oral secretions and intermittent coughing.  Vitals:   01/27/21 0700 01/27/21 0756 01/27/21 0800 01/27/21 1000  BP:  117/83 (!) 141/85 111/76  Pulse: (!) 106 (!) 103 (!) 107 96  Resp: 17 (!) 25 (!) 23 (!) 26  Temp:   97.8 F (36.6 C)   TempSrc:   Oral   SpO2: 94% 91% 94% 92%  Weight:      Height:       CBC:  Recent Labs  Lab 01/26/21 0413 01/27/21 0333  WBC 11.3* 11.2*  HGB 7.4* 7.8*  HCT 24.2* 26.0*  MCV 92.4 94.2  PLT 484* 161*   Basic Metabolic Panel:  Recent Labs  Lab 01/26/21 0413 01/27/21 0333  NA 145 149*  K 4.0 3.7  CL 104 108  CO2 23 24  GLUCOSE 108* 108*  BUN 205* 219*  CREATININE 5.93* 6.68*  CALCIUM 10.3 10.2  PHOS 8.6* 7.7*     IMAGING past 24 hours CT ABDOMEN PELVIS WO CONTRAST  Result Date: 01/07/2021 CLINICAL DATA:  Inpatient. Acute nonlocalized abdominal pain. Pontine hemorrhage. EXAM: CT ABDOMEN AND PELVIS WITHOUT CONTRAST TECHNIQUE: Multidetector CT imaging of the abdomen and pelvis was performed following the standard protocol without IV contrast. COMPARISON:  01/02/2021 abdominal radiograph. FINDINGS: Lower chest: Mild to moderate dependent bibasilar atelectasis. Coronary atherosclerosis. Hepatobiliary: Normal liver size. No liver mass. Prominently distended (6.4 cm diameter) gallbladder. No gallbladder wall thickening. No pericholecystic fluid. No radiopaque cholelithiasis. No biliary ductal dilatation. CBD diameter 5 mm. Pancreas: No pancreatic duct dilation or discrete pancreatic mass. Mild peripancreatic fat stranding at the pancreatic head. No peripancreatic fluid collections. Spleen: Normal size. No mass. Adrenals/Urinary Tract: No discrete adrenal nodules. No hydronephrosis. No renal stones. Simple 1.8 cm  lower right renal cyst. Scattered subcentimeter hyperdense and hypodense bilateral renal cortical lesions are too small to characterize and require no follow-up. Bladder completely collapsed by indwelling Foley catheter with expected gas in the nondependent bladder lumen. Stomach/Bowel: Enteric tube terminates in duodenal bulb. Stomach is collapsed. Suggestion of generalized gastric wall thickening, poorly assessed on this scan, with associated fat stranding in the gastrohepatic ligament. Normal caliber small bowel with no small bowel wall thickening. Normal appendix. Moderate diffuse colonic diverticulosis, with no large bowel wall thickening or significant pericolonic fat stranding. Vascular/Lymphatic: Atherosclerotic nonaneurysmal abdominal aorta. No pathologically enlarged lymph nodes in the abdomen or pelvis. Reproductive: Normal size prostate. Other: No pneumoperitoneum. Small volume ascites, predominantly perihepatic. No focal fluid collections. Symmetric mild-to-moderate bilateral gynecomastia. Musculoskeletal: No aggressive appearing focal osseous lesions. Mild thoracolumbar spondylosis. Chronic appearing mild T10 vertebral compression fracture. IMPRESSION: 1. Suggestion of generalized gastric wall thickening, poorly assessed on this scan due to gastric decompression by enteric tube, with associated fat stranding in the gastrohepatic ligament. Findings are nonspecific and could be due to noninflammatory edema, gastritis, peptic ulcer disease or neoplasm. Consider upper endoscopic correlation as clinically warranted. 2. Small volume ascites, predominantly perihepatic. 3. Prominently distended gallbladder. No radiopaque cholelithiasis. No gallbladder wall thickening. No pericholecystic fluid. No biliary ductal dilatation. 4. Moderate diffuse colonic diverticulosis. 5. Mild to moderate dependent bibasilar atelectasis. 6. Coronary atherosclerosis. 7. Chronic appearing mild T10 vertebral compression fracture. 8.  Aortic Atherosclerosis (ICD10-I70.0). Electronically Signed   By: Ilona Sorrel M.D.   On: 01/07/2021 13:14  DG Chest 1 View  Result Date: 01/12/2021 CLINICAL DATA:  Intubation, stroke EXAM: CHEST  1 VIEW COMPARISON:  Portable exam 0942 hours compared to 01/25/2021 FINDINGS: Tip of endotracheal tube projects 3.0 cm above carina. Feeding tube extends into abdomen. Normal heart size mediastinal contours. Persistent RIGHT basilar atelectasis with improved aeration at LEFT base since previous exam. No new infiltrate, pleural effusion, or pneumothorax. Bones demineralized. IMPRESSION: Persistent mild RIGHT basilar atelectasis. Improved aeration at LEFT lung base since previous exam. Electronically Signed   By: Lavonia Dana M.D.   On: 01/12/2021 12:03   CT HEAD WO CONTRAST (5MM)  Result Date: 01/14/2021 CLINICAL DATA:  Stroke, follow up EXAM: CT HEAD WITHOUT CONTRAST TECHNIQUE: Contiguous axial images were obtained from the base of the skull through the vertex without intravenous contrast. COMPARISON:  CT head January 07, 2021. FINDINGS: Brain: When accounting for differences in technique (different plane of imaging on axial imaging), similar size an intraparenchymal hemorrhage within the pons, measuring up to approximately 2.0 by 3.2 by 2.6 cm (similar when remeasured). Surrounding edema in the brainstem appears similar. Intraventricular hemorrhage in the fourth ventricle is decreased. Similar effacement of the fourth ventricle without progressive ventriculomegaly to suggest obstructive hydrocephalus. No evidence of interval acute large vascular territory infarct. Patchy white matter hypoattenuation, compatible with advanced chronic microvascular ischemic disease that appears similar to prior. No midline shift. Similar mild cerebral atrophy. Vascular: No hyperdense vessel identified. Skull: No acute fracture. Sinuses/Orbits: Mucosal thickening of scattered paranasal sinuses, similar. No acute orbital findings.  Other: Small bilateral mastoid effusions. IMPRESSION: 1. When accounting for differences in technique, similar size of a recent intraparenchymal hemorrhage within the pons, as detailed above. Similar surrounding brainstem edema. Intraventricular hemorrhage in the fourth ventricle is decreased. Similar effacement of the fourth ventricle without progressive ventriculomegaly to suggest obstructive hydrocephalus. 2. Similar advanced chronic microvascular ischemic disease. Electronically Signed   By: Margaretha Sheffield M.D.   On: 01/14/2021 16:24   CT HEAD WO CONTRAST (5MM)  Result Date: 01/07/2021 CLINICAL DATA:  Stroke, follow-up 6 hour stability scan. EXAM: CT HEAD WITHOUT CONTRAST TECHNIQUE: Contiguous axial images were obtained from the base of the skull through the vertex without intravenous contrast. COMPARISON:  Brain MRI 01/07/2021. head CT 01/19/2021. FINDINGS: Brain: Mild generalized cerebral atrophy. Stable size of an acute parenchymal hemorrhage within the pons, again measuring 2.8 x 2.1 cm in transaxial dimensions and 2.6 cm in craniocaudal dimension. As before, there is surrounding edema within the brainstem with mass effect and partial fourth ventricular effacement. Unchanged small volume extension of hemorrhage into the fourth ventricle and posterior fossa subarachnoid spaces. No evidence of obstructive hydrocephalus at this time. Stable supratentorial chronic small vessel ischemic disease. No demarcated cortical infarct. Vascular: No hyperdense vessel.  Atherosclerotic calcifications. Skull: Normal. Negative for fracture or focal lesion. Sinuses/Orbits: Visualized orbits show no acute finding. Mild mucosal thickening within the bilateral ethmoid, sphenoid and maxillary sinuses. IMPRESSION: Stable size of an acute parenchymal hemorrhage within the pons. As before, there is surrounding edema within the brainstem with mass effect and partial effacement of the fourth ventricle. No evidence of obstructive  hydrocephalus at this time. Also unchanged, there is small-volume extension of hemorrhage into the fourth ventricle and posterior fossa subarachnoid spaces. Stable background cerebral atrophy and chronic supratentorial chronic small vessel ischemic disease. Electronically Signed   By: Kellie Simmering D.O.   On: 01/07/2021 11:42   CT Head Wo Contrast  Result Date: 01/02/2021 CLINICAL DATA:  Mental status change.  Found unresponsive. EXAM: CT HEAD WITHOUT CONTRAST TECHNIQUE: Contiguous axial images were obtained from the base of the skull through the vertex without intravenous contrast. COMPARISON:  None. FINDINGS: Brain: There is a hyperdense mass within the brainstem measuring 2.8 by 2.1 by 2.6 cm compatible with hemorrhagic infarct. There is surrounding low attenuation edema. Posterior extension of clot is identified into the fourth ventricle and the cisterna magna, image 70/2. decreased CSF space is noted at the cerebellar pontine angle. There is moderate patchy low-attenuation within the subcortical and periventricular white matter compatible with chronic microvascular disease. Prominence of the sulci identified compatible with brain atrophy. Vascular: No hyperdense vessel or unexpected calcification. Skull: Normal. Negative for fracture or focal lesion. Sinuses/Orbits: Mild mucosal thickening is noted involving the maxillary sinuses. Mastoid air cells are clear. Other: None. IMPRESSION: 1. Examination is positive for hemorrhagic infarct within the brainstem. 2. Posterior extension of clot into the fourth ventricle and the cisterna magna. 3. Chronic small vessel ischemic disease and brain atrophy. Critical Value/emergent results were called by telephone at the time of interpretation on 01/08/2021 at 2:41 pm to provider [facility] , who verbally acknowledged these results. Electronically Signed   By: Kerby Moors M.D.   On: 01/19/2021 14:42   MR BRAIN WO CONTRAST  Result Date: 01/07/2021 CLINICAL DATA:   55 year old male with altered mental status, found unresponsive. Brainstem hemorrhage. Ophthalmoplegia. EXAM: MRI HEAD AND ORBITS WITHOUT CONTRAST TECHNIQUE: Multiplanar, multi-echo pulse sequences of the brain and surrounding structures were acquired without intravenous contrast. Multiplanar, multi-echo pulse sequences of the orbits and surrounding structures were acquired including fat saturation techniques, without intravenous contrast administration. COMPARISON:  Plain head CT 12/31/2020. FINDINGS: MRI HEAD FINDINGS Brain: Heterogeneous T2 and largely isointense T1 hemorrhage within the brainstem is expanding the pons and as seen by SWI tracks into the 4th ventricle. Estimated intra-axial hemorrhage size by MRI is 24 x 29 by 28 mm (AP by transverse by CC) for an estimated blood volume of 10 mL. Extensive pontine edema. On the dedicated orbit images edema is seen to track into the right medullary pyramid (series 8, image 7 of that exam). Edema also tracks cephalad into the midbrain greater on the right. Fourth ventricle size remains relatively normal. Punctate blood layering in the left occipital horn but no other lateral or 3rd intraventricular hemorrhage. No ventriculomegaly. No definite transependymal edema. Small volume of subarachnoid hemorrhage in the cisterna magna. SWI also demonstrates chronic microhemorrhages scattered in the cerebellum (especially on the left series 15, image 15) and bilateral deep gray matter nuclei (image 34. Occasional cerebral hemisphere microhemorrhages (image 35 on the left). Basilar cisterns remain patent.  No midline shift. DWI demonstrates no larger area of diffusion restriction. No chronic cortical encephalomalacia but moderate Patchy and confluent bilateral cerebral white matter T2 and FLAIR hyperintensity in a pattern suggestive of chronic small vessel ischemia. Vascular: Major intracranial vascular flow voids are preserved. Skull and upper cervical spine: Normal bone  marrow signal. Negative visible cervical spine. Other: Mastoids are clear. Grossly normal visible internal auditory structures. Intubated. Right nasoenteric tube in place. Fluid in the pharynx. MRI ORBITS FINDINGS Orbits: Normal suprasellar cistern and optic chiasm. Optic nerves appear symmetric, within normal limits. No contrast administered. But there is no intraorbital fat stranding or inflammation identified. No intraorbital mass or mass effect identified. Globes appear normal. Unremarkable noncontrast cavernous sinus. Visualized sinuses: Scattered paranasal sinus fluid and mucosal thickening in the setting of intubation and right nasoenteric tube. Soft tissues: Negative visible deep  soft tissue spaces of the face. IMPRESSION: 1. Acute brainstem hemorrhage centered at the pons with estimated intra-axial blood volume of 10 mL. Extensive pontine edema, which tracks associated edema which tracks into the midbrain right > left and into the right medullary pyramid. 2. Mild extension of hemorrhage into the 4th ventricle and basilar subarachnoid spaces. But no ventriculomegaly. No midline shift or impending herniation. 3. Underlying advanced chronic small vessel disease including multiple chronic microhemorrhages in the cerebellum and bilateral deep gray matter nuclei. Advanced bilateral cerebral white matter ischemia. 4. Negative noncontrast MRI appearance of the orbits. Electronically Signed   By: Genevie Ann M.D.   On: 01/07/2021 05:37   DG CHEST PORT 1 VIEW  Result Date: 01/29/2021 CLINICAL DATA:  Postop trach placement. EXAM: PORTABLE CHEST 1 VIEW COMPARISON:  Radiograph earlier today. FINDINGS: Removal of endotracheal tube with no tracheostomy tube in place. Tracheostomy tube tip projects over the clavicular heads. Persistent low lung volumes. Bibasilar atelectasis, with confluent retrocardiac opacity. Stable heart size and mediastinal contours. No pneumothorax. There may be small pleural effusions. IMPRESSION:  1. Tracheostomy tube tip projects over the clavicular heads. 2. Persistent low lung volumes with bibasilar atelectasis and confluent retrocardiac opacity. Possible small pleural effusions. Electronically Signed   By: Keith Rake M.D.   On: 01/19/2021 19:59   DG Chest Port 1 View  Result Date: 01/18/2021 CLINICAL DATA:  ET tube present, respiratory failure EXAM: PORTABLE CHEST 1 VIEW COMPARISON:  Chest radiograph 01/13/2021 FINDINGS: The endotracheal tube tip is approximately 2.8 cm from the carina. The cardiomediastinal silhouette is stable. Linear opacity projecting over the lateral left base likely reflect subsegmental atelectasis. There is no other focal consolidation or pulmonary edema. There is no pleural effusion or pneumothorax. There is no acute osseous abnormality. IMPRESSION: 1. Endotracheal tube in satisfactory position. 2. Mild left basilar subsegmental atelectasis. Otherwise, no radiographic evidence of acute cardiopulmonary process. Electronically Signed   By: Valetta Mole M.D.   On: 01/30/2021 12:05   DG CHEST PORT 1 VIEW  Result Date: 01/13/2021 CLINICAL DATA:  Acute respiratory failure with hypoxia EXAM: PORTABLE CHEST 1 VIEW COMPARISON:  01/12/2021. FINDINGS: Endotracheal tube tip 3.8 cm above the carina. Enteric tube courses below the diaphragm in outside the field of view. Similar streaky right basilar opacities, compatible with atelectasis. No new consolidation. No visible pleural effusions or pneumothorax. Similar cardiomediastinal silhouette. IMPRESSION: Similar right basilar atelectasis.  No new acute abnormality. Electronically Signed   By: Margaretha Sheffield M.D.   On: 01/13/2021 07:21   DG CHEST PORT 1 VIEW  Result Date: 01/19/2021 CLINICAL DATA:  Central line placement EXAM: PORTABLE CHEST 1 VIEW COMPARISON:  01/19/2021 FINDINGS: Endotracheal tube tip is about 4 cm superior to carina. Left IJ central venous catheter tip over the SVC. Esophageal tube tip below the  diaphragm. No visible pneumothorax. Interval airspace disease at left base which may be due to atelectasis or aspiration. IMPRESSION: 1. Left IJ central venous catheter tip over the SVC. No pneumothorax 2. Interval airspace disease at left base which may be due to atelectasis or aspiration Electronically Signed   By: Donavan Foil M.D.   On: 12/31/2020 18:33   DG Chest Port 1 View  Result Date: 01/08/2021 CLINICAL DATA:  OG tube placement EXAM: PORTABLE CHEST 1 VIEW COMPARISON:  None. FINDINGS: Endotracheal tube with the tip 5.8 cm above the carina. Nasogastric tube coursing below the diaphragm with the tip excluded from the field of view. No focal consolidation. No pleural effusion  or pneumothorax. Heart and mediastinal contours are unremarkable. No acute osseous abnormality. IMPRESSION: Endotracheal tube with the tip 5.8 cm above the carina. Nasogastric tube coursing below the diaphragm with the tip excluded from the field of view. Electronically Signed   By: Kathreen Devoid M.D.   On: 01/04/2021 17:18   DG Chest Port 1 View  Result Date: 01/30/2021 CLINICAL DATA:  A 54 year old male presents with sepsis now intubated. EXAM: PORTABLE CHEST 1 VIEW COMPARISON:  No recent comparisons, most recent prior is from October of 2013. FINDINGS: EKG leads project over the chest. A gastric tube courses through in off the field of the radiograph. Side port is below GE junction, tip is not visualized. Endotracheal tube at 2.5 cm from the carina. Cardiomediastinal contours are normal. RIGHT hemidiaphragm is elevated. This is slightly accentuated when compared to prior imaging but there is no sign of consolidation, effusion or pneumothorax. On limited assessment there is no acute skeletal process. Incidental note is made of what appears to be in hearing projecting over the RIGHT supraclavicular region and therefore more likely external to the patient. IMPRESSION: Endotracheal tube is at 2.5 cm from the carina. No acute  cardiopulmonary process. Gastric tube courses through off the field of the radiograph. And earring projects over the RIGHT supraclavicular region, may be anterior or posterior to the patient. Electronically Signed   By: Zetta Bills M.D.   On: 01/29/2021 14:07   DG Abd Portable 1V  Result Date: 01/11/2021 CLINICAL DATA:  Nasogastric tube placement EXAM: PORTABLE ABDOMEN - 1 VIEW COMPARISON:  None. FINDINGS: Enteric feeding tube is positioned below the diaphragm with tip in the vicinity of the pylorus. Nonobstructive pattern of included bowel gas. IMPRESSION: Enteric feeding tube is positioned below the diaphragm with tip in the vicinity of the pylorus. Consider advancement if post pyloric positioning is desired. Electronically Signed   By: Delanna Ahmadi M.D.   On: 01/11/2021 13:21   DG Abd Portable 1V  Result Date: 01/09/2021 CLINICAL DATA:  NG tube placement EXAM: PORTABLE ABDOMEN - 1 VIEW COMPARISON:  KUB 01/01/2021 FINDINGS: The enteric catheter tip projects over the expected location of the pylorus/first portion of the duodenum. The side hole projects over the distal stomach. There is a nonobstructive bowel gas pattern to the level imaged. The lung bases are clear. IMPRESSION: Enteric catheter in appropriate position. Electronically Signed   By: Valetta Mole M.D.   On: 01/09/2021 08:49   DG Abd Portable 1V  Result Date: 01/12/2021 CLINICAL DATA:  NG tube EXAM: PORTABLE ABDOMEN - 1 VIEW COMPARISON:  None. FINDINGS: Esophageal tube tip overlies the distal stomach. Upper gas pattern is unremarkable IMPRESSION: Esophageal tube tip overlies the distal stomach Electronically Signed   By: Donavan Foil M.D.   On: 01/22/2021 18:34   ECHOCARDIOGRAM COMPLETE  Result Date: 01/07/2021    ECHOCARDIOGRAM REPORT   Patient Name:   WITTEN CERTAIN Date of Exam: 01/07/2021 Medical Rec #:  [id number]        Height:       74.0 in Accession #:    [id number]       Weight:       155.9 lb Date of Birth:  [birth year]          BSA:          1.955 m Patient Age:    38 years         BP:           114/80 mmHg  Patient Gender: M                HR:           92 bpm. Exam Location:  Inpatient Procedure: 2D Echo, Cardiac Doppler, Color Doppler and 3D Echo Indications:    Stroke I63.9  History:        Patient has prior history of Echocardiogram examinations, most                 recent 06/16/2015. CHF; Risk Factors:Hypertension. Chronic kidney                 disease.  Sonographer:    Darlina Sicilian RDCS Referring Phys: [id number] [place]  1. Left ventricular ejection fraction, by estimation, is 55 to 60%. The left ventricle has normal function. The left ventricle demonstrates regional wall motion abnormalities. Basal inferior akinesis.There is moderate asymmetric left ventricular hypertrophy of the basal-septal segment. Left ventricular diastolic parameters are consistent with Grade I diastolic dysfunction (impaired relaxation).  2. Right ventricular systolic function is normal. The right ventricular size is normal. Tricuspid regurgitation signal is inadequate for assessing PA pressure.  3. The mitral valve is normal in structure. No evidence of mitral valve regurgitation.  4. The aortic valve was not well visualized. Aortic valve regurgitation is mild. No aortic stenosis is present.  5. The inferior vena cava is normal in size with greater than 50% respiratory variability, suggesting right atrial pressure of 3 mmHg. FINDINGS  Left Ventricle: Left ventricular ejection fraction, by estimation, is 55 to 60%. The left ventricle has normal function. The left ventricle demonstrates regional wall motion abnormalities. The left ventricular internal cavity size was normal in size. There is moderate asymmetric left ventricular hypertrophy of the basal-septal segment. Left ventricular diastolic parameters are consistent with Grade I diastolic dysfunction (impaired relaxation). Right Ventricle: The right ventricular size is normal. No  increase in right ventricular wall thickness. Right ventricular systolic function is normal. Tricuspid regurgitation signal is inadequate for assessing PA pressure. Left Atrium: Left atrial size was normal in size. Right Atrium: Right atrial size was normal in size. Pericardium: There is no evidence of pericardial effusion. Mitral Valve: The mitral valve is normal in structure. No evidence of mitral valve regurgitation. Tricuspid Valve: The tricuspid valve is normal in structure. Tricuspid valve regurgitation is trivial. Aortic Valve: The aortic valve was not well visualized. Aortic valve regurgitation is mild. No aortic stenosis is present. Pulmonic Valve: The pulmonic valve was not well visualized. Pulmonic valve regurgitation is not visualized. Aorta: The aortic root is normal in size and structure. Venous: The inferior vena cava is normal in size with greater than 50% respiratory variability, suggesting right atrial pressure of 3 mmHg. IAS/Shunts: The interatrial septum was not well visualized.  LEFT VENTRICLE PLAX 2D LVIDd:         5.80 cm   Diastology LVIDs:         3.70 cm   LV e' medial:    6.64 cm/s LV PW:         0.95 cm   LV E/e' medial:  5.8 LV IVS:        0.95 cm   LV e' lateral:   7.62 cm/s LVOT diam:     2.30 cm   LV E/e' lateral: 5.1 LV SV:         66 LV SV Index:   34 LVOT Area:     4.15 cm  3D Volume EF:                          3D EF:        50 %                          LV EDV:       109 ml                          LV ESV:       54 ml                          LV SV:        55 ml RIGHT VENTRICLE RV S prime:     18.30 cm/s TAPSE (M-mode): 1.7 cm LEFT ATRIUM             Index        RIGHT ATRIUM          Index LA diam:        3.10 cm 1.59 cm/m   RA Area:     7.93 cm LA Vol (A2C):   26.9 ml 13.76 ml/m  RA Volume:   10.80 ml 5.52 ml/m LA Vol (A4C):   25.7 ml 13.15 ml/m LA Biplane Vol: 25.6 ml 13.10 ml/m  AORTIC VALVE LVOT Vmax:   88.80 cm/s LVOT Vmean:  61.000 cm/s LVOT  VTI:    0.159 m  AORTA Ao Root diam: 3.70 cm MITRAL VALVE MV Area (PHT): 3.42 cm    SHUNTS MV Decel Time: 222 msec    Systemic VTI:  0.16 m MV E velocity: 38.60 cm/s  Systemic Diam: 2.30 cm MV A velocity: 56.60 cm/s MV E/A ratio:  0.68 Oswaldo Milian MD Electronically signed by Oswaldo Milian MD Signature Date/Time: 01/07/2021/4:27:39 PM    Final    MR ORBITS WO CONTRAST  Result Date: 01/07/2021 CLINICAL DATA:  55 year old male with altered mental status, found unresponsive. Brainstem hemorrhage. Ophthalmoplegia. EXAM: MRI HEAD AND ORBITS WITHOUT CONTRAST TECHNIQUE: Multiplanar, multi-echo pulse sequences of the brain and surrounding structures were acquired without intravenous contrast. Multiplanar, multi-echo pulse sequences of the orbits and surrounding structures were acquired including fat saturation techniques, without intravenous contrast administration. COMPARISON:  Plain head CT 01/02/2021. FINDINGS: MRI HEAD FINDINGS Brain: Heterogeneous T2 and largely isointense T1 hemorrhage within the brainstem is expanding the pons and as seen by SWI tracks into the 4th ventricle. Estimated intra-axial hemorrhage size by MRI is 24 x 29 by 28 mm (AP by transverse by CC) for an estimated blood volume of 10 mL. Extensive pontine edema. On the dedicated orbit images edema is seen to track into the right medullary pyramid (series 8, image 7 of that exam). Edema also tracks cephalad into the midbrain greater on the right. Fourth ventricle size remains relatively normal. Punctate blood layering in the left occipital horn but no other lateral or 3rd intraventricular hemorrhage. No ventriculomegaly. No definite transependymal edema. Small volume of subarachnoid hemorrhage in the cisterna magna. SWI also demonstrates chronic microhemorrhages scattered in the cerebellum (especially on the left series 15, image 15) and bilateral deep gray matter nuclei (image 34. Occasional cerebral hemisphere microhemorrhages  (image 35 on the left). Basilar cisterns remain patent.  No midline shift. DWI demonstrates no larger area of diffusion restriction. No chronic cortical encephalomalacia but moderate  Patchy and confluent bilateral cerebral white matter T2 and FLAIR hyperintensity in a pattern suggestive of chronic small vessel ischemia. Vascular: Major intracranial vascular flow voids are preserved. Skull and upper cervical spine: Normal bone marrow signal. Negative visible cervical spine. Other: Mastoids are clear. Grossly normal visible internal auditory structures. Intubated. Right nasoenteric tube in place. Fluid in the pharynx. MRI ORBITS FINDINGS Orbits: Normal suprasellar cistern and optic chiasm. Optic nerves appear symmetric, within normal limits. No contrast administered. But there is no intraorbital fat stranding or inflammation identified. No intraorbital mass or mass effect identified. Globes appear normal. Unremarkable noncontrast cavernous sinus. Visualized sinuses: Scattered paranasal sinus fluid and mucosal thickening in the setting of intubation and right nasoenteric tube. Soft tissues: Negative visible deep soft tissue spaces of the face. IMPRESSION: 1. Acute brainstem hemorrhage centered at the pons with estimated intra-axial blood volume of 10 mL. Extensive pontine edema, which tracks associated edema which tracks into the midbrain right > left and into the right medullary pyramid. 2. Mild extension of hemorrhage into the 4th ventricle and basilar subarachnoid spaces. But no ventriculomegaly. No midline shift or impending herniation. 3. Underlying advanced chronic small vessel disease including multiple chronic microhemorrhages in the cerebellum and bilateral deep gray matter nuclei. Advanced bilateral cerebral white matter ischemia. 4. Negative noncontrast MRI appearance of the orbits. Electronically Signed   By: Genevie Ann M.D.   On: 01/07/2021 05:37      PHYSICAL EXAM  Temp:  [97.6 F (36.4 C)-101.5 F  (38.6 C)] 97.8 F (36.6 C) (10/28 0800) Pulse Rate:  [89-115] 96 (10/28 1000) Resp:  [17-34] 26 (10/28 1000) BP: (103-141)/(74-87) 111/76 (10/28 1000) SpO2:  [91 %-100 %] 92 % (10/28 1000) FiO2 (%):  [40 %-98 %] 60 % (10/28 0756) Weight:  [73.1 kg] 73.1 kg (10/28 0400)  General - Well nourished, well developed, on trach and vent, lethargic.   Ophthalmologic - fundi not visualized due to noncooperation.   Cardiovascular - Regular rate and rhythm.   Neuro - lethargic, eyes closed. Left eye open on voice, right eye status post temporary tarsorrhaphy. Able to follow some simple midline commands and peripheral commands on the right hand and foot. Left eye in mid position, blinking to visual threat on the left eye, horizontal gaze palsy, but able to have upward and downward gaze. Corneal reflex present on the left, on trach with intermittent coughing, tongue protrusion not cooperative. left facial droop. RUE barely against gravity, however follow commands on the right hand. RLE withdraw to pain, follow commands on the right foot, but hemiplegia on the left. Bilaterally no babinski. Sensation, coordination not cooperative and gait not tested.     ASSESSMENT/PLAN HADES MATHEW is a 55 y.o. male with a past medical history significant for uncontrolled hypertension, CKD stage IV, heart failure, hyperlipidemia, daily drinking, medication nonadherence, iron deficiency anemia/anemia of chronic disease, presenting with a pontine hemorrhage. ICH Score: 3.    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  CT head right pontine hemorrhage with midbrain and medulla extension, IVH at fourth ventricle MRI  Acute brainstem hemorrhage centered at the pons. Extensive pontine edema, which tracks associated edema which tracks into the midbrain right > left and into the right medullary pyramid. Mild extension of hemorrhage into the 4th ventricle and  basilar subarachnoid spaces. But no ventriculomegaly. No midline shift or impending herniation. Negative noncontrast MRI appearance of the orbits. CT repeat stable hematoma, no hydrocephalus Consider  MRA to rule out AVM or aneurysm once hematoma resolves if aggressive care. 2D Echo EF 55 to 60%.  LDL 75 HgbA1c 5.4 VTE prophylaxis - Heparin subcu Not on Anticoagulant or antiplatelet prior to admission, now on no antithrombotics due to [place] Therapy recommendations:  SNF Disposition: Pending, palliative care on board, family yet to decide further [place]  Cerebral Edema Neurosurgery consulted, no acute intervention recommended at this time. Off 3% saline allow sodium to trend down with daily check   Na 143->141->136  Acute respiratory failure Probable aspiration pneumonia Remains intubated, appreciate CCM management First on Unasyn->Rocephin->off Tmax 100.7->afebrile now Resp culture: few gram positive cocci PEG 10/18 with Trauma Trach 10/21 with ENT CCM on bard Still has copious secretions post trach    Hypertensive emergency Home meds: none Stable now BP goal less than 160  Currently on Norvasc 10, Coreg 25, hydralazine 100 Q8 Long-term BP goal normotensive  Hyperlipidemia Home meds:  None LDL  75, almost at goal < 70 May consider statin at discharge  AKI on CKD Ischemic ATN Anemia  Creatinine 3.65-3.80-5.2-6.27-5.69-5.46-5.44-5.60-5.53->5.90->5.93->6.68 BUN continues to elevate 200->205->219 Hb 8.2->7.6->7.7->7.3->7.1->6.6->PRBC->8.1->7.6->7.4->7.8 On tube feeding and free water  Urine adequate No acute need for dialysis Nephrology on board - not candidate for dialysis Palliative care on board, family pondering on [place]  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 Eulas Post Cipro eyedrop during the day and erythromycin ointment  nightly Continue Vit C 2g and docxy 100 bid Appreciate ophthal Dr. Roda Shutters help  ETOH use disorder with dependence Daily use of excessive liquor per family (?1/2 bottle vodka)  Thiamine, folate and MVI on board Monitor magnesium, 2.2->2.2->2.1 Vitamin B12 -213 Monitor s/s of withdrawal, need for CIWA   Dysphagia Continue tube feeding and free water  Speech on board CT Chest Abd Pelvis: generalized gastric wall thickening, with associated fat stranding in the gastrohepatic ligament.Findings are nonspecific and could be due to noninflammatory edema,gastritis, peptic ulcer disease or neoplasm.  PEG done 10/18  Tobacco abuse Current smoker Smoking cessation counseling will be provided  Other Stroke Risk Factors Current Cigarette smoker, will be advised to stop smoking ETOH use disorder alcohol level <10, advised to drink no more than 2 drink(s) a day  Other Active Problems Multinodular goiter  Hospital day # 21   Rosalin Hawking, MD PhD Stroke Neurology 01/27/2021 10:12 AM   To contact Stroke Continuity provider, please refer to [email or web address] After hours, contact General Neurology

## 2021-01-28 DIAGNOSIS — R1312 Dysphagia, oropharyngeal phase: Secondary | ICD-10-CM

## 2021-01-28 DIAGNOSIS — N186 End stage renal disease: Secondary | ICD-10-CM

## 2021-01-28 DIAGNOSIS — N179 Acute kidney failure, unspecified: Secondary | ICD-10-CM | POA: Diagnosis not present

## 2021-01-28 DIAGNOSIS — J9601 Acute respiratory failure with hypoxia: Secondary | ICD-10-CM | POA: Diagnosis not present

## 2021-01-28 DIAGNOSIS — I613 Nontraumatic intracerebral hemorrhage in brain stem: Secondary | ICD-10-CM | POA: Diagnosis not present

## 2021-01-28 DIAGNOSIS — Z515 Encounter for palliative care: Secondary | ICD-10-CM

## 2021-01-28 DIAGNOSIS — I161 Hypertensive emergency: Secondary | ICD-10-CM | POA: Diagnosis not present

## 2021-01-28 LAB — RENAL FUNCTION PANEL
Albumin: 2.3 g/dL — ABNORMAL LOW (ref 3.5–5.0)
Anion gap: 18 — ABNORMAL HIGH (ref 5–15)
BUN: 233 mg/dL — ABNORMAL HIGH (ref 6–20)
CO2: 22 mmol/L (ref 22–32)
Calcium: 10.5 mg/dL — ABNORMAL HIGH (ref 8.9–10.3)
Chloride: 109 mmol/L (ref 98–111)
Creatinine, Ser: 7.12 mg/dL — ABNORMAL HIGH (ref 0.61–1.24)
GFR, Estimated: 9 mL/min — ABNORMAL LOW (ref >60)
Glucose, Bld: 118 mg/dL — ABNORMAL HIGH (ref 70–99)
Phosphorus: 7.4 mg/dL — ABNORMAL HIGH (ref 2.5–4.6)
Potassium: 4 mmol/L (ref 3.5–5.1)
Sodium: 149 mmol/L — ABNORMAL HIGH (ref 135–145)

## 2021-01-28 LAB — CBC
HCT: 26.3 % — ABNORMAL LOW (ref 39.0–52.0)
Hemoglobin: 8 g/dL — ABNORMAL LOW (ref 13.0–17.0)
MCH: 28.7 pg (ref 26.0–34.0)
MCHC: 30.4 g/dL (ref 30.0–36.0)
MCV: 94.3 fL (ref 80.0–100.0)
Platelets: 425 10*3/uL — ABNORMAL HIGH (ref 150–400)
RBC: 2.79 MIL/uL — ABNORMAL LOW (ref 4.22–5.81)
RDW: 17.2 % — ABNORMAL HIGH (ref 11.5–15.5)
WBC: 12.7 10*3/uL — ABNORMAL HIGH (ref 4.0–10.5)
nRBC: 0 % (ref 0.0–0.2)

## 2021-01-28 LAB — GLUCOSE, CAPILLARY: Glucose-Capillary: 118 mg/dL — ABNORMAL HIGH (ref 70–99)

## 2021-01-28 MED ORDER — SODIUM CHLORIDE 0.9% FLUSH: 3.0000 mL | INTRAVENOUS | Status: DC | PRN

## 2021-01-28 MED ORDER — GLYCOPYRROLATE 1 MG PO TABS
1.0000 mg | ORAL_TABLET | ORAL | Status: DC | PRN
  Filled 2021-01-28: qty 1

## 2021-01-28 MED ORDER — SODIUM CHLORIDE 0.9% FLUSH
3.0000 mL | Freq: Two times a day (BID) | INTRAVENOUS | Status: DC
  Administered 2021-01-29 – 2021-02-01 (×8): 3 mL via INTRAVENOUS

## 2021-01-28 MED ORDER — LORAZEPAM 2 MG/ML PO CONC
1.0000 mg | ORAL | Status: DC | PRN
  Filled 2021-01-28: qty 0.5

## 2021-01-28 MED ORDER — GLYCOPYRROLATE 0.2 MG/ML IJ SOLN
0.2000 mg | INTRAMUSCULAR | Status: DC | PRN
  Administered 2021-01-30 – 2021-02-01 (×4): 0.2 mg via INTRAVENOUS
  Filled 2021-01-28 (×4): qty 1

## 2021-01-28 MED ORDER — GLYCOPYRROLATE 0.2 MG/ML IJ SOLN: 0.2000 mg | INTRAMUSCULAR | Status: DC | PRN

## 2021-01-28 MED ORDER — ALBUTEROL SULFATE (2.5 MG/3ML) 0.083% IN NEBU: 2.5000 mg | INHALATION_SOLUTION | RESPIRATORY_TRACT | Status: DC | PRN

## 2021-01-28 MED ORDER — ONDANSETRON 4 MG PO TBDP: 4.0000 mg | ORAL_TABLET | Freq: Four times a day (QID) | ORAL | Status: DC | PRN

## 2021-01-28 MED ORDER — ACETAMINOPHEN 325 MG PO TABS: 650.0000 mg | ORAL_TABLET | Freq: Four times a day (QID) | ORAL | Status: DC | PRN

## 2021-01-28 MED ORDER — ONDANSETRON HCL 4 MG/2ML IJ SOLN: 4.0000 mg | Freq: Four times a day (QID) | INTRAMUSCULAR | Status: DC | PRN

## 2021-01-28 MED ORDER — ACETAMINOPHEN 650 MG RE SUPP
650.0000 mg | Freq: Four times a day (QID) | RECTAL | Status: DC | PRN
  Administered 2021-02-01: 650 mg via RECTAL
  Filled 2021-01-28: qty 1

## 2021-01-28 MED ORDER — LORAZEPAM 2 MG/ML IJ SOLN
1.0000 mg | INTRAMUSCULAR | Status: DC | PRN
  Administered 2021-01-29 – 2021-01-31 (×5): 1 mg via INTRAVENOUS
  Filled 2021-01-28 (×5): qty 1

## 2021-01-28 MED ORDER — SODIUM CHLORIDE 0.9 % IV SOLN: 250.0000 mL | INTRAVENOUS | Status: DC | PRN

## 2021-01-28 MED ORDER — LORAZEPAM 1 MG PO TABS: 1.0000 mg | ORAL_TABLET | ORAL | Status: DC | PRN

## 2021-01-28 MED ORDER — HYDROMORPHONE HCL 1 MG/ML IJ SOLN
0.5000 mg | INTRAMUSCULAR | Status: DC | PRN
  Administered 2021-01-28 – 2021-02-01 (×12): 0.5 mg via INTRAVENOUS
  Filled 2021-01-28: qty 0.5
  Filled 2021-01-28 (×2): qty 1
  Filled 2021-01-28 (×2): qty 0.5
  Filled 2021-01-28 (×2): qty 1
  Filled 2021-01-28: qty 0.5
  Filled 2021-01-28: qty 1
  Filled 2021-01-28: qty 0.5
  Filled 2021-01-28: qty 1
  Filled 2021-01-28: qty 0.5

## 2021-01-28 NOTE — Progress Notes (Signed)
Comfort orders received, no family currently present at bedside.  RN called and spoke with patient's daughter [name] to discuss next steps and plans to proceed with comfort measures and opened the discussion to answer any questions.     Shemeka stated she understood next steps and advised okay for RN to initiate comfort measures at this time.  Konrad Saha that visitation was unlimited for Mr. Timothy Good and family could be present outside of normal visitation hours with still only 2 family members in the room at a time.

## 2021-01-28 NOTE — Significant Event (Signed)
  Patient's Sister Malachy Mood was able to visit him from [place] along with her husband. Conference call with patient's daughter Ms. She Shemeka, patient's partner Ms. Nori Riis and sister Malachy Mood on the phone.  All of his family members are in agreement that patient is not doing well and he never wanted to live like this.  All agree that comfort care and end-of-life care is appropriate way to take care of him.  Plan: Comfort care pathway initiated. All symptom control medications available, Will use Dilaudid IV for pain and discomfort. Will use Ativan IV for anxiety and distress. Anticholinergics for secretions. Continue Foley catheter.  Continue stool softener to avoid constipation. No escalation of care.  No labs drawn.  Discontinue tube feeding. Will transfer out of ICU to facilitate unrestricted visitation. RN can pronounce death if happens in the hospital. Will monitor for 24 to 48 hours, he will probably pass away in the hospital.  Will refer to inpatient hospice if prolonged end-of-life care needed.  Intracranial hemorrhage with severe uncontrolled hypertension Acute hypoxemic respiratory failure secondary to pontine hemorrhage, ventilatory dependent respiratory failure now with tracheostomy Acute kidney injury on chronic kidney disease stage IV, uremia and end-stage renal failure Dysphagia status post PEG tube End-of-life care.

## 2021-01-28 NOTE — Progress Notes (Signed)
PROGRESS NOTE    Timothy Good  YQM:[id number] DOB: 1966/03/17 DOA: 01/23/2021 PCP: Raiford Simmonds., PA-C    Brief Narrative:  55 year old gentleman with multiple medical issues who was found down at home and found by police on a welfare visit.  Has history of uncontrolled hypertension, CKD stage IV, heart failure, hyperlipidemia, alcohol use, medication nonadherence, iron-deficiency anemia.  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 10/7, admitted to ICU and intubated.  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 10/21, tracheostomy and liberation from the ventilator.  PEG tube insertion. 10/22, seen by palliative changed to DNR.  DNR with no escalation of care and no dialysis. 10/25, transferred to [facility] care.  Remains on very poor clinical status. 10/29, awaiting all family members to gather together to decide on comfort care.   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 Worsening clinical situation.  Not a candidate for rehab at this stage.  Acute hypoxemic respiratory failure secondary to pontine hemorrhage.  Ventilator dependent and now status post tracheostomy on trach collar: Currently remains off ventilator, able to maintain on trach collar. ENT following as needed.  Sutures removed. Continue aggressive pulmonary  therapy/breathing exercises and respiratory care.  Family decided not to go back on ventilator.  Acute kidney injury on chronic kidney disease stage IV, ischemic ATN: Decided no dialysis now or in the future as per goal of care. Gradually worsening renal functions.  Probably uremic.  Hypertensive emergency: Blood pressures are low normal now.  All his antihypertensives are on hold.  He is on torsemide.  He is on as needed hydralazine and labetalol with goal of systolic blood pressure less than 160.  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 Eulas Post.  Cipro eyedrops and erythromycin eyedrops.  On oral doxycycline.  Dysphagia: On PEG tube feeding.  Continue today.  Goal of care:  DNR/DNI.  No escalation of care.  No ventilator.  No hemodialysis. All local family members including daughter, son, partner and patient's sister visited patient on 10/27 and they are in agreement that he is not in a good shape and they agree for comfort care. Patient's sister visiting from [place] today. Family requested to continue care until sister could visit. Will reconvene with family after their visit today.   DVT prophylaxis: heparin injection 5,000 Units Start: 01/15/21 0100 SCD's Start: 01/13/2021 1701   Code Status: DNR Family Communication: Daughter on the phone 10/28. Disposition Plan: Status is: Inpatient  Remains inpatient appropriate because: Unsafe discharge plan.  Not stable to transfer.   Can go to progressive bed.        Consultants:  ENT Critical care Palliative care  Procedures:  Right lead tarsorrhaphy Tracheostomy PEG tube placement  Antimicrobials:  Ciprofloxacin eyedrops.  Doxycycline by mouth.   Subjective: Patient seen and examined.  Remains lethargic and tired.  Copious secretions on the tracheostomy. Follow simple commands on  the right side.  Objective: Vitals:   01/28/21 0600  01/28/21 0700 01/28/21 0800 01/28/21 0829  BP:   (!) 123/98   Pulse: (!) 109 (!) 114 (!) 108 (!) 115  Resp: (!) 26 (!) 25 (!) 24 (!) 27  Temp:   97.6 F (36.4 C)   TempSrc:   Axillary   SpO2: 96% 97% 94% 94%  Weight:      Height:        Intake/Output Summary (Last 24 hours) at 01/28/2021 1052 Last data filed at 01/28/2021 1000 Gross per 24 hour  Intake 1470 ml  Output 1330 ml  Net 140 ml   Filed Weights   01/22/21 0147 01/23/21 0405 01/27/21 0400  Weight: 76 kg 75.4 kg 73.1 kg    Examination:  General exam: Sick looking.  Frail and debilitated. Right eye lead are sutured together. He has tracheostomy with trach collar, currently on 10 L oxygen. Follows simple commands on the right side.  Left side is flaccid. Generalized anasarca. Respiratory system: Conducted airway sounds. SpO2: 94 % O2 Flow Rate (L/min): 10 L/min FiO2 (%): 40 %  Cardiovascular system: S1 & S2 heard, RRR.  Gastrointestinal system: Soft.  Nontender.  Bowel sounds present.  PEG tube in place.    Data Reviewed: I have personally reviewed following labs and imaging studies  CBC: Recent Labs  Lab 01/24/21 0317 01/25/21 0500 01/26/21 0413 01/27/21 0333 01/28/21 0404  WBC 11.5* 11.4* 11.3* 11.2* 12.7*  HGB 7.2* 7.4* 7.4* 7.8* 8.0*  HCT 23.0* 24.3* 24.2* 26.0* 26.3*  MCV 90.6 91.7 92.4 94.2 94.3  PLT 466* 433* 484* 497* 086*   Basic Metabolic Panel: Recent Labs  Lab 01/24/21 0317 01/25/21 0500 01/26/21 0413 01/27/21 0333 01/28/21 0404  NA 141 145 145 149* 149*  K 3.9 3.9 4.0 3.7 4.0  CL 99 102 104 108 109  CO2 $Re'26 26 23 24 22  'RON$ GLUCOSE 104* 109* 108* 108* 118*  BUN 187* 200* 205* 219* 233*  CREATININE 6.22* 6.21* 5.93* 6.68* 7.12*  CALCIUM 10.0 10.3 10.3 10.2 10.5*  PHOS  --   --  8.6* 7.7* 7.4*   GFR: Estimated Creatinine Clearance: 12.4 mL/min (A) (by C-G formula based on SCr of 7.12 mg/dL (H)). Liver Function Tests: Recent Labs  Lab 01/26/21 0413 01/27/21 0333 01/28/21 0404   AST 36  --   --   ALT 99*  --   --   ALKPHOS 98  --   --   BILITOT 0.8  --   --   PROT 8.0  --   --   ALBUMIN 2.2*  2.2* 2.1* 2.3*   No results for input(s): LIPASE, AMYLASE in the last 168 hours. No results for input(s): AMMONIA in the last 168 hours. Coagulation Profile: No results for input(s): INR, PROTIME in the last 168 hours.  Cardiac Enzymes: No results for input(s): CKTOTAL, CKMB, CKMBINDEX, TROPONINI in the last 168 hours. BNP (last 3 results) No results for input(s): PROBNP in the last 8760 hours. HbA1C: No results for input(s): HGBA1C in the last 72 hours. CBG: Recent Labs  Lab 01/26/21 1621 01/26/21 2333 01/27/21 1159 01/27/21 1819 01/27/21 2327  GLUCAP 115* 108* 118* 115* 111*   Lipid Profile: No results for input(s): CHOL, HDL, LDLCALC, TRIG, CHOLHDL, LDLDIRECT in the last 72 hours. Thyroid Function Tests: No results for input(s): TSH, T4TOTAL, FREET4, T3FREE, THYROIDAB in the last 72 hours. Anemia Panel: No results for input(s): VITAMINB12, FOLATE, FERRITIN, TIBC, IRON, RETICCTPCT in the  last 72 hours. Sepsis Labs: No results for input(s): PROCALCITON, LATICACIDVEN in the last 168 hours.  Recent Results (from the past 240 hour(s))  Surgical PCR screen     Status: None   Collection Time: 01/13/2021  4:54 AM   Specimen: Nasal Mucosa; Nasal Swab  Result Value Ref Range Status   MRSA, PCR NEGATIVE NEGATIVE Final   Staphylococcus aureus NEGATIVE NEGATIVE Final    Comment: (NOTE) The Xpert SA Assay (FDA approved for NASAL specimens in patients 51 years of age and older), is one component of a comprehensive surveillance program. It is not intended to diagnose infection nor to guide or monitor treatment. Performed at [facility], [place] [address] [place], [place] [address]          Radiology Studies: No results found.      Scheduled Meds:  vitamin C  2,000 mg Per Tube Daily   chlorhexidine gluconate (MEDLINE KIT)  15 mL Mouth Rinse BID    Chlorhexidine Gluconate Cloth  6 each Topical Daily   ciprofloxacin  2 drop Right Eye Q4H while awake   doxycycline  100 mg Per Tube Q12H   erythromycin   Right Eye Q6H   feeding supplement (PROSource TF)  45 mL Per Tube TID   free water  100 mL Per Tube Q6H   heparin injection (subcutaneous)  5,000 Units Subcutaneous Q8H   mouth rinse  15 mL Mouth Rinse 10 times per day   pantoprazole sodium  40 mg Per Tube Daily   sodium bicarbonate  650 mg Per Tube BID   thiamine  100 mg Per Tube Daily   Continuous Infusions:  sodium chloride Stopped (01/14/21 1454)   feeding supplement (NEPRO CARB STEADY) 1,000 mL (01/27/21 1715)     LOS: 22 days    Time spent: 32 minutes    Barb Merino, MD [facility] Pager [phone]

## 2021-01-28 NOTE — Progress Notes (Signed)
STROKE TEAM PROGRESS NOTE   INTERVAL HISTORY No family at the bedside. Pt lying in bed, on trach with copious secretions, eyes open this am, no other significant neuro change. Still lethargic, follows simple commands on the right. Renal function continues to worsen. Per Dr. Sloan Leiter, family leaning towards comfort care and waiting for sister coming to visit today and then proceed with comfort care vs. Hospice.  Vitals:   01/28/21 0600 01/28/21 0700 01/28/21 0800 01/28/21 0829  BP:   (!) 123/98   Pulse: (!) 109 (!) 114 (!) 108 (!) 115  Resp: (!) 26 (!) 25 (!) 24 (!) 27  Temp:   97.6 F (36.4 C)   TempSrc:   Axillary   SpO2: 96% 97% 94% 94%  Weight:      Height:       CBC:  Recent Labs  Lab 01/27/21 0333 01/28/21 0404  WBC 11.2* 12.7*  HGB 7.8* 8.0*  HCT 26.0* 26.3*  MCV 94.2 94.3  PLT 497* 562*   Basic Metabolic Panel:  Recent Labs  Lab 01/27/21 0333 01/28/21 0404  NA 149* 149*  K 3.7 4.0  CL 108 109  CO2 24 22  GLUCOSE 108* 118*  BUN 219* 233*  CREATININE 6.68* 7.12*  CALCIUM 10.2 10.5*  PHOS 7.7* 7.4*     IMAGING past 24 hours CT ABDOMEN PELVIS WO CONTRAST  Result Date: 01/07/2021 CLINICAL DATA:  Inpatient. Acute nonlocalized abdominal pain. Pontine hemorrhage. EXAM: CT ABDOMEN AND PELVIS WITHOUT CONTRAST TECHNIQUE: Multidetector CT imaging of the abdomen and pelvis was performed following the standard protocol without IV contrast. COMPARISON:  01/23/2021 abdominal radiograph. FINDINGS: Lower chest: Mild to moderate dependent bibasilar atelectasis. Coronary atherosclerosis. Hepatobiliary: Normal liver size. No liver mass. Prominently distended (6.4 cm diameter) gallbladder. No gallbladder wall thickening. No pericholecystic fluid. No radiopaque cholelithiasis. No biliary ductal dilatation. CBD diameter 5 mm. Pancreas: No pancreatic duct dilation or discrete pancreatic mass. Mild peripancreatic fat stranding at the pancreatic head. No peripancreatic fluid collections.  Spleen: Normal size. No mass. Adrenals/Urinary Tract: No discrete adrenal nodules. No hydronephrosis. No renal stones. Simple 1.8 cm lower right renal cyst. Scattered subcentimeter hyperdense and hypodense bilateral renal cortical lesions are too small to characterize and require no follow-up. Bladder completely collapsed by indwelling Foley catheter with expected gas in the nondependent bladder lumen. Stomach/Bowel: Enteric tube terminates in duodenal bulb. Stomach is collapsed. Suggestion of generalized gastric wall thickening, poorly assessed on this scan, with associated fat stranding in the gastrohepatic ligament. Normal caliber small bowel with no small bowel wall thickening. Normal appendix. Moderate diffuse colonic diverticulosis, with no large bowel wall thickening or significant pericolonic fat stranding. Vascular/Lymphatic: Atherosclerotic nonaneurysmal abdominal aorta. No pathologically enlarged lymph nodes in the abdomen or pelvis. Reproductive: Normal size prostate. Other: No pneumoperitoneum. Small volume ascites, predominantly perihepatic. No focal fluid collections. Symmetric mild-to-moderate bilateral gynecomastia. Musculoskeletal: No aggressive appearing focal osseous lesions. Mild thoracolumbar spondylosis. Chronic appearing mild T10 vertebral compression fracture. IMPRESSION: 1. Suggestion of generalized gastric wall thickening, poorly assessed on this scan due to gastric decompression by enteric tube, with associated fat stranding in the gastrohepatic ligament. Findings are nonspecific and could be due to noninflammatory edema, gastritis, peptic ulcer disease or neoplasm. Consider upper endoscopic correlation as clinically warranted. 2. Small volume ascites, predominantly perihepatic. 3. Prominently distended gallbladder. No radiopaque cholelithiasis. No gallbladder wall thickening. No pericholecystic fluid. No biliary ductal dilatation. 4. Moderate diffuse colonic diverticulosis. 5. Mild to  moderate dependent bibasilar atelectasis. 6. Coronary atherosclerosis. 7. Chronic  appearing mild T10 vertebral compression fracture. 8. Aortic Atherosclerosis (ICD10-I70.0). Electronically Signed   By: Ilona Sorrel M.D.   On: 01/07/2021 13:14   DG Chest 1 View  Result Date: 01/12/2021 CLINICAL DATA:  Intubation, stroke EXAM: CHEST  1 VIEW COMPARISON:  Portable exam 0942 hours compared to 01/12/2021 FINDINGS: Tip of endotracheal tube projects 3.0 cm above carina. Feeding tube extends into abdomen. Normal heart size mediastinal contours. Persistent RIGHT basilar atelectasis with improved aeration at LEFT base since previous exam. No new infiltrate, pleural effusion, or pneumothorax. Bones demineralized. IMPRESSION: Persistent mild RIGHT basilar atelectasis. Improved aeration at LEFT lung base since previous exam. Electronically Signed   By: Lavonia Dana M.D.   On: 01/12/2021 12:03   CT HEAD WO CONTRAST (5MM)  Result Date: 01/14/2021 CLINICAL DATA:  Stroke, follow up EXAM: CT HEAD WITHOUT CONTRAST TECHNIQUE: Contiguous axial images were obtained from the base of the skull through the vertex without intravenous contrast. COMPARISON:  CT head January 07, 2021. FINDINGS: Brain: When accounting for differences in technique (different plane of imaging on axial imaging), similar size an intraparenchymal hemorrhage within the pons, measuring up to approximately 2.0 by 3.2 by 2.6 cm (similar when remeasured). Surrounding edema in the brainstem appears similar. Intraventricular hemorrhage in the fourth ventricle is decreased. Similar effacement of the fourth ventricle without progressive ventriculomegaly to suggest obstructive hydrocephalus. No evidence of interval acute large vascular territory infarct. Patchy white matter hypoattenuation, compatible with advanced chronic microvascular ischemic disease that appears similar to prior. No midline shift. Similar mild cerebral atrophy. Vascular: No hyperdense vessel  identified. Skull: No acute fracture. Sinuses/Orbits: Mucosal thickening of scattered paranasal sinuses, similar. No acute orbital findings. Other: Small bilateral mastoid effusions. IMPRESSION: 1. When accounting for differences in technique, similar size of a recent intraparenchymal hemorrhage within the pons, as detailed above. Similar surrounding brainstem edema. Intraventricular hemorrhage in the fourth ventricle is decreased. Similar effacement of the fourth ventricle without progressive ventriculomegaly to suggest obstructive hydrocephalus. 2. Similar advanced chronic microvascular ischemic disease. Electronically Signed   By: Margaretha Sheffield M.D.   On: 01/14/2021 16:24   CT HEAD WO CONTRAST (5MM)  Result Date: 01/07/2021 CLINICAL DATA:  Stroke, follow-up 6 hour stability scan. EXAM: CT HEAD WITHOUT CONTRAST TECHNIQUE: Contiguous axial images were obtained from the base of the skull through the vertex without intravenous contrast. COMPARISON:  Brain MRI 01/07/2021. head CT 01/10/2021. FINDINGS: Brain: Mild generalized cerebral atrophy. Stable size of an acute parenchymal hemorrhage within the pons, again measuring 2.8 x 2.1 cm in transaxial dimensions and 2.6 cm in craniocaudal dimension. As before, there is surrounding edema within the brainstem with mass effect and partial fourth ventricular effacement. Unchanged small volume extension of hemorrhage into the fourth ventricle and posterior fossa subarachnoid spaces. No evidence of obstructive hydrocephalus at this time. Stable supratentorial chronic small vessel ischemic disease. No demarcated cortical infarct. Vascular: No hyperdense vessel.  Atherosclerotic calcifications. Skull: Normal. Negative for fracture or focal lesion. Sinuses/Orbits: Visualized orbits show no acute finding. Mild mucosal thickening within the bilateral ethmoid, sphenoid and maxillary sinuses. IMPRESSION: Stable size of an acute parenchymal hemorrhage within the pons. As  before, there is surrounding edema within the brainstem with mass effect and partial effacement of the fourth ventricle. No evidence of obstructive hydrocephalus at this time. Also unchanged, there is small-volume extension of hemorrhage into the fourth ventricle and posterior fossa subarachnoid spaces. Stable background cerebral atrophy and chronic supratentorial chronic small vessel ischemic disease. Electronically Signed   By:  Kellie Simmering D.O.   On: 01/07/2021 11:42   CT Head Wo Contrast  Result Date: 01/23/2021 CLINICAL DATA:  Mental status change.  Found unresponsive. EXAM: CT HEAD WITHOUT CONTRAST TECHNIQUE: Contiguous axial images were obtained from the base of the skull through the vertex without intravenous contrast. COMPARISON:  None. FINDINGS: Brain: There is a hyperdense mass within the brainstem measuring 2.8 by 2.1 by 2.6 cm compatible with hemorrhagic infarct. There is surrounding low attenuation edema. Posterior extension of clot is identified into the fourth ventricle and the cisterna magna, image 70/2. decreased CSF space is noted at the cerebellar pontine angle. There is moderate patchy low-attenuation within the subcortical and periventricular white matter compatible with chronic microvascular disease. Prominence of the sulci identified compatible with brain atrophy. Vascular: No hyperdense vessel or unexpected calcification. Skull: Normal. Negative for fracture or focal lesion. Sinuses/Orbits: Mild mucosal thickening is noted involving the maxillary sinuses. Mastoid air cells are clear. Other: None. IMPRESSION: 1. Examination is positive for hemorrhagic infarct within the brainstem. 2. Posterior extension of clot into the fourth ventricle and the cisterna magna. 3. Chronic small vessel ischemic disease and brain atrophy. Critical Value/emergent results were called by telephone at the time of interpretation on 01/18/2021 at 2:41 pm to provider [facility] , who verbally acknowledged these  results. Electronically Signed   By: Kerby Moors M.D.   On: 01/30/2021 14:42   MR BRAIN WO CONTRAST  Result Date: 01/07/2021 CLINICAL DATA:  55 year old male with altered mental status, found unresponsive. Brainstem hemorrhage. Ophthalmoplegia. EXAM: MRI HEAD AND ORBITS WITHOUT CONTRAST TECHNIQUE: Multiplanar, multi-echo pulse sequences of the brain and surrounding structures were acquired without intravenous contrast. Multiplanar, multi-echo pulse sequences of the orbits and surrounding structures were acquired including fat saturation techniques, without intravenous contrast administration. COMPARISON:  Plain head CT 01/16/2021. FINDINGS: MRI HEAD FINDINGS Brain: Heterogeneous T2 and largely isointense T1 hemorrhage within the brainstem is expanding the pons and as seen by SWI tracks into the 4th ventricle. Estimated intra-axial hemorrhage size by MRI is 24 x 29 by 28 mm (AP by transverse by CC) for an estimated blood volume of 10 mL. Extensive pontine edema. On the dedicated orbit images edema is seen to track into the right medullary pyramid (series 8, image 7 of that exam). Edema also tracks cephalad into the midbrain greater on the right. Fourth ventricle size remains relatively normal. Punctate blood layering in the left occipital horn but no other lateral or 3rd intraventricular hemorrhage. No ventriculomegaly. No definite transependymal edema. Small volume of subarachnoid hemorrhage in the cisterna magna. SWI also demonstrates chronic microhemorrhages scattered in the cerebellum (especially on the left series 15, image 15) and bilateral deep gray matter nuclei (image 34. Occasional cerebral hemisphere microhemorrhages (image 35 on the left). Basilar cisterns remain patent.  No midline shift. DWI demonstrates no larger area of diffusion restriction. No chronic cortical encephalomalacia but moderate Patchy and confluent bilateral cerebral white matter T2 and FLAIR hyperintensity in a pattern suggestive  of chronic small vessel ischemia. Vascular: Major intracranial vascular flow voids are preserved. Skull and upper cervical spine: Normal bone marrow signal. Negative visible cervical spine. Other: Mastoids are clear. Grossly normal visible internal auditory structures. Intubated. Right nasoenteric tube in place. Fluid in the pharynx. MRI ORBITS FINDINGS Orbits: Normal suprasellar cistern and optic chiasm. Optic nerves appear symmetric, within normal limits. No contrast administered. But there is no intraorbital fat stranding or inflammation identified. No intraorbital mass or mass effect identified. Globes appear normal. Unremarkable  noncontrast cavernous sinus. Visualized sinuses: Scattered paranasal sinus fluid and mucosal thickening in the setting of intubation and right nasoenteric tube. Soft tissues: Negative visible deep soft tissue spaces of the face. IMPRESSION: 1. Acute brainstem hemorrhage centered at the pons with estimated intra-axial blood volume of 10 mL. Extensive pontine edema, which tracks associated edema which tracks into the midbrain right > left and into the right medullary pyramid. 2. Mild extension of hemorrhage into the 4th ventricle and basilar subarachnoid spaces. But no ventriculomegaly. No midline shift or impending herniation. 3. Underlying advanced chronic small vessel disease including multiple chronic microhemorrhages in the cerebellum and bilateral deep gray matter nuclei. Advanced bilateral cerebral white matter ischemia. 4. Negative noncontrast MRI appearance of the orbits. Electronically Signed   By: Genevie Ann M.D.   On: 01/07/2021 05:37   DG CHEST PORT 1 VIEW  Result Date: 01/04/2021 CLINICAL DATA:  Postop trach placement. EXAM: PORTABLE CHEST 1 VIEW COMPARISON:  Radiograph earlier today. FINDINGS: Removal of endotracheal tube with no tracheostomy tube in place. Tracheostomy tube tip projects over the clavicular heads. Persistent low lung volumes. Bibasilar atelectasis, with  confluent retrocardiac opacity. Stable heart size and mediastinal contours. No pneumothorax. There may be small pleural effusions. IMPRESSION: 1. Tracheostomy tube tip projects over the clavicular heads. 2. Persistent low lung volumes with bibasilar atelectasis and confluent retrocardiac opacity. Possible small pleural effusions. Electronically Signed   By: Keith Rake M.D.   On: 01/04/2021 19:59   DG Chest Port 1 View  Result Date: 01/21/2021 CLINICAL DATA:  ET tube present, respiratory failure EXAM: PORTABLE CHEST 1 VIEW COMPARISON:  Chest radiograph 01/13/2021 FINDINGS: The endotracheal tube tip is approximately 2.8 cm from the carina. The cardiomediastinal silhouette is stable. Linear opacity projecting over the lateral left base likely reflect subsegmental atelectasis. There is no other focal consolidation or pulmonary edema. There is no pleural effusion or pneumothorax. There is no acute osseous abnormality. IMPRESSION: 1. Endotracheal tube in satisfactory position. 2. Mild left basilar subsegmental atelectasis. Otherwise, no radiographic evidence of acute cardiopulmonary process. Electronically Signed   By: Valetta Mole M.D.   On: 01/04/2021 12:05   DG CHEST PORT 1 VIEW  Result Date: 01/13/2021 CLINICAL DATA:  Acute respiratory failure with hypoxia EXAM: PORTABLE CHEST 1 VIEW COMPARISON:  01/12/2021. FINDINGS: Endotracheal tube tip 3.8 cm above the carina. Enteric tube courses below the diaphragm in outside the field of view. Similar streaky right basilar opacities, compatible with atelectasis. No new consolidation. No visible pleural effusions or pneumothorax. Similar cardiomediastinal silhouette. IMPRESSION: Similar right basilar atelectasis.  No new acute abnormality. Electronically Signed   By: Margaretha Sheffield M.D.   On: 01/13/2021 07:21   DG CHEST PORT 1 VIEW  Result Date: 01/04/2021 CLINICAL DATA:  Central line placement EXAM: PORTABLE CHEST 1 VIEW COMPARISON:  01/24/2021 FINDINGS:  Endotracheal tube tip is about 4 cm superior to carina. Left IJ central venous catheter tip over the SVC. Esophageal tube tip below the diaphragm. No visible pneumothorax. Interval airspace disease at left base which may be due to atelectasis or aspiration. IMPRESSION: 1. Left IJ central venous catheter tip over the SVC. No pneumothorax 2. Interval airspace disease at left base which may be due to atelectasis or aspiration Electronically Signed   By: Donavan Foil M.D.   On: 01/24/2021 18:33   DG Chest Port 1 View  Result Date: 01/10/2021 CLINICAL DATA:  OG tube placement EXAM: PORTABLE CHEST 1 VIEW COMPARISON:  None. FINDINGS: Endotracheal tube with the tip  5.8 cm above the carina. Nasogastric tube coursing below the diaphragm with the tip excluded from the field of view. No focal consolidation. No pleural effusion or pneumothorax. Heart and mediastinal contours are unremarkable. No acute osseous abnormality. IMPRESSION: Endotracheal tube with the tip 5.8 cm above the carina. Nasogastric tube coursing below the diaphragm with the tip excluded from the field of view. Electronically Signed   By: Kathreen Devoid M.D.   On: 01/10/2021 17:18   DG Chest Port 1 View  Result Date: 01/01/2021 CLINICAL DATA:  A 55 year old male presents with sepsis now intubated. EXAM: PORTABLE CHEST 1 VIEW COMPARISON:  No recent comparisons, most recent prior is from October of 2013. FINDINGS: EKG leads project over the chest. A gastric tube courses through in off the field of the radiograph. Side port is below GE junction, tip is not visualized. Endotracheal tube at 2.5 cm from the carina. Cardiomediastinal contours are normal. RIGHT hemidiaphragm is elevated. This is slightly accentuated when compared to prior imaging but there is no sign of consolidation, effusion or pneumothorax. On limited assessment there is no acute skeletal process. Incidental note is made of what appears to be in hearing projecting over the RIGHT  supraclavicular region and therefore more likely external to the patient. IMPRESSION: Endotracheal tube is at 2.5 cm from the carina. No acute cardiopulmonary process. Gastric tube courses through off the field of the radiograph. And earring projects over the RIGHT supraclavicular region, may be anterior or posterior to the patient. Electronically Signed   By: Zetta Bills M.D.   On: 01/09/2021 14:07   DG Abd Portable 1V  Result Date: 01/11/2021 CLINICAL DATA:  Nasogastric tube placement EXAM: PORTABLE ABDOMEN - 1 VIEW COMPARISON:  None. FINDINGS: Enteric feeding tube is positioned below the diaphragm with tip in the vicinity of the pylorus. Nonobstructive pattern of included bowel gas. IMPRESSION: Enteric feeding tube is positioned below the diaphragm with tip in the vicinity of the pylorus. Consider advancement if post pyloric positioning is desired. Electronically Signed   By: Delanna Ahmadi M.D.   On: 01/11/2021 13:21   DG Abd Portable 1V  Result Date: 01/09/2021 CLINICAL DATA:  NG tube placement EXAM: PORTABLE ABDOMEN - 1 VIEW COMPARISON:  KUB 01/28/2021 FINDINGS: The enteric catheter tip projects over the expected location of the pylorus/first portion of the duodenum. The side hole projects over the distal stomach. There is a nonobstructive bowel gas pattern to the level imaged. The lung bases are clear. IMPRESSION: Enteric catheter in appropriate position. Electronically Signed   By: Valetta Mole M.D.   On: 01/09/2021 08:49   DG Abd Portable 1V  Result Date: 01/16/2021 CLINICAL DATA:  NG tube EXAM: PORTABLE ABDOMEN - 1 VIEW COMPARISON:  None. FINDINGS: Esophageal tube tip overlies the distal stomach. Upper gas pattern is unremarkable IMPRESSION: Esophageal tube tip overlies the distal stomach Electronically Signed   By: Donavan Foil M.D.   On: 01/15/2021 18:34   ECHOCARDIOGRAM COMPLETE  Result Date: 01/07/2021    ECHOCARDIOGRAM REPORT   Patient Name:   MAICOL BOWLAND Date of Exam:  01/07/2021 Medical Rec #:  [id number]        Height:       74.0 in Accession #:    [id number]       Weight:       155.9 lb Date of Birth:  July 25, 1965         BSA:          1.955 m Patient Age:  55 years         BP:           114/80 mmHg Patient Gender: M                HR:           92 bpm. Exam Location:  Inpatient Procedure: 2D Echo, Cardiac Doppler, Color Doppler and 3D Echo Indications:    Stroke I63.9  History:        Patient has prior history of Echocardiogram examinations, most                 recent 06/16/2015. CHF; Risk Factors:Hypertension. Chronic kidney                 disease.  Sonographer:    Darlina Sicilian RDCS Referring Phys: [id number] [place]  1. Left ventricular ejection fraction, by estimation, is 55 to 60%. The left ventricle has normal function. The left ventricle demonstrates regional wall motion abnormalities. Basal inferior akinesis.There is moderate asymmetric left ventricular hypertrophy of the basal-septal segment. Left ventricular diastolic parameters are consistent with Grade I diastolic dysfunction (impaired relaxation).  2. Right ventricular systolic function is normal. The right ventricular size is normal. Tricuspid regurgitation signal is inadequate for assessing PA pressure.  3. The mitral valve is normal in structure. No evidence of mitral valve regurgitation.  4. The aortic valve was not well visualized. Aortic valve regurgitation is mild. No aortic stenosis is present.  5. The inferior vena cava is normal in size with greater than 50% respiratory variability, suggesting right atrial pressure of 3 mmHg. FINDINGS  Left Ventricle: Left ventricular ejection fraction, by estimation, is 55 to 60%. The left ventricle has normal function. The left ventricle demonstrates regional wall motion abnormalities. The left ventricular internal cavity size was normal in size. There is moderate asymmetric left ventricular hypertrophy of the basal-septal segment. Left ventricular  diastolic parameters are consistent with Grade I diastolic dysfunction (impaired relaxation). Right Ventricle: The right ventricular size is normal. No increase in right ventricular wall thickness. Right ventricular systolic function is normal. Tricuspid regurgitation signal is inadequate for assessing PA pressure. Left Atrium: Left atrial size was normal in size. Right Atrium: Right atrial size was normal in size. Pericardium: There is no evidence of pericardial effusion. Mitral Valve: The mitral valve is normal in structure. No evidence of mitral valve regurgitation. Tricuspid Valve: The tricuspid valve is normal in structure. Tricuspid valve regurgitation is trivial. Aortic Valve: The aortic valve was not well visualized. Aortic valve regurgitation is mild. No aortic stenosis is present. Pulmonic Valve: The pulmonic valve was not well visualized. Pulmonic valve regurgitation is not visualized. Aorta: The aortic root is normal in size and structure. Venous: The inferior vena cava is normal in size with greater than 50% respiratory variability, suggesting right atrial pressure of 3 mmHg. IAS/Shunts: The interatrial septum was not well visualized.  LEFT VENTRICLE PLAX 2D LVIDd:         5.80 cm   Diastology LVIDs:         3.70 cm   LV e' medial:    6.64 cm/s LV PW:         0.95 cm   LV E/e' medial:  5.8 LV IVS:        0.95 cm   LV e' lateral:   7.62 cm/s LVOT diam:     2.30 cm   LV E/e' lateral: 5.1 LV SV:  66 LV SV Index:   34 LVOT Area:     4.15 cm                           3D Volume EF:                          3D EF:        50 %                          LV EDV:       109 ml                          LV ESV:       54 ml                          LV SV:        55 ml RIGHT VENTRICLE RV S prime:     18.30 cm/s TAPSE (M-mode): 1.7 cm LEFT ATRIUM             Index        RIGHT ATRIUM          Index LA diam:        3.10 cm 1.59 cm/m   RA Area:     7.93 cm LA Vol (A2C):   26.9 ml 13.76 ml/m  RA Volume:   10.80  ml 5.52 ml/m LA Vol (A4C):   25.7 ml 13.15 ml/m LA Biplane Vol: 25.6 ml 13.10 ml/m  AORTIC VALVE LVOT Vmax:   88.80 cm/s LVOT Vmean:  61.000 cm/s LVOT VTI:    0.159 m  AORTA Ao Root diam: 3.70 cm MITRAL VALVE MV Area (PHT): 3.42 cm    SHUNTS MV Decel Time: 222 msec    Systemic VTI:  0.16 m MV E velocity: 38.60 cm/s  Systemic Diam: 2.30 cm MV A velocity: 56.60 cm/s MV E/A ratio:  0.68 Oswaldo Milian MD Electronically signed by Oswaldo Milian MD Signature Date/Time: 01/07/2021/4:27:39 PM    Final    MR ORBITS WO CONTRAST  Result Date: 01/07/2021 CLINICAL DATA:  54 year old male with altered mental status, found unresponsive. Brainstem hemorrhage. Ophthalmoplegia. EXAM: MRI HEAD AND ORBITS WITHOUT CONTRAST TECHNIQUE: Multiplanar, multi-echo pulse sequences of the brain and surrounding structures were acquired without intravenous contrast. Multiplanar, multi-echo pulse sequences of the orbits and surrounding structures were acquired including fat saturation techniques, without intravenous contrast administration. COMPARISON:  Plain head CT 01/09/2021. FINDINGS: MRI HEAD FINDINGS Brain: Heterogeneous T2 and largely isointense T1 hemorrhage within the brainstem is expanding the pons and as seen by SWI tracks into the 4th ventricle. Estimated intra-axial hemorrhage size by MRI is 24 x 29 by 28 mm (AP by transverse by CC) for an estimated blood volume of 10 mL. Extensive pontine edema. On the dedicated orbit images edema is seen to track into the right medullary pyramid (series 8, image 7 of that exam). Edema also tracks cephalad into the midbrain greater on the right. Fourth ventricle size remains relatively normal. Punctate blood layering in the left occipital horn but no other lateral or 3rd intraventricular hemorrhage. No ventriculomegaly. No definite transependymal edema. Small volume of subarachnoid hemorrhage in the cisterna magna. SWI also demonstrates chronic microhemorrhages scattered in the  cerebellum (especially on the left series 15,  image 15) and bilateral deep gray matter nuclei (image 34. Occasional cerebral hemisphere microhemorrhages (image 35 on the left). Basilar cisterns remain patent.  No midline shift. DWI demonstrates no larger area of diffusion restriction. No chronic cortical encephalomalacia but moderate Patchy and confluent bilateral cerebral white matter T2 and FLAIR hyperintensity in a pattern suggestive of chronic small vessel ischemia. Vascular: Major intracranial vascular flow voids are preserved. Skull and upper cervical spine: Normal bone marrow signal. Negative visible cervical spine. Other: Mastoids are clear. Grossly normal visible internal auditory structures. Intubated. Right nasoenteric tube in place. Fluid in the pharynx. MRI ORBITS FINDINGS Orbits: Normal suprasellar cistern and optic chiasm. Optic nerves appear symmetric, within normal limits. No contrast administered. But there is no intraorbital fat stranding or inflammation identified. No intraorbital mass or mass effect identified. Globes appear normal. Unremarkable noncontrast cavernous sinus. Visualized sinuses: Scattered paranasal sinus fluid and mucosal thickening in the setting of intubation and right nasoenteric tube. Soft tissues: Negative visible deep soft tissue spaces of the face. IMPRESSION: 1. Acute brainstem hemorrhage centered at the pons with estimated intra-axial blood volume of 10 mL. Extensive pontine edema, which tracks associated edema which tracks into the midbrain right > left and into the right medullary pyramid. 2. Mild extension of hemorrhage into the 4th ventricle and basilar subarachnoid spaces. But no ventriculomegaly. No midline shift or impending herniation. 3. Underlying advanced chronic small vessel disease including multiple chronic microhemorrhages in the cerebellum and bilateral deep gray matter nuclei. Advanced bilateral cerebral white matter ischemia. 4. Negative noncontrast MRI  appearance of the orbits. Electronically Signed   By: Genevie Ann M.D.   On: 01/07/2021 05:37      PHYSICAL EXAM  Temp:  [97.6 F (36.4 C)-99.9 F (37.7 C)] 97.6 F (36.4 C) (10/29 0800) Pulse Rate:  [96-115] 115 (10/29 0829) Resp:  [11-33] 27 (10/29 0829) BP: (109-124)/(69-98) 123/98 (10/29 0800) SpO2:  [92 %-99 %] 94 % (10/29 0829) FiO2 (%):  [40 %-60 %] 40 % (10/29 0829)  General - Well nourished, well developed, on trach and vent, lethargic.   Ophthalmologic - fundi not visualized due to noncooperation.   Cardiovascular - Regular rate and rhythm.   Neuro - lethargic, left eye spontaneously open, right eye status post temporary tarsorrhaphy. Not follow central command today but still follow peripheral commands on the right hand and foot. Left eye in mid position, not consistently blinking to visual threat, did not follow commands for eye movement today. Corneal reflex present on the left, on trach with intermittent coughing, tongue protrusion not cooperative. left facial droop. RUE proximal 2/5 but bicep 3/5 and finger grip 2+/5, follow commands on the right hand. RLE withdraw to pain, follow commands on the right foot, but hemiplegia on the left. Bilaterally no babinski. Sensation, coordination not cooperative and gait not tested.     ASSESSMENT/PLAN Timothy Good is a 55 y.o. male with a past medical history significant for uncontrolled hypertension, CKD stage IV, heart failure, hyperlipidemia, daily drinking, medication nonadherence, iron deficiency anemia/anemia of chronic disease, presenting with a pontine hemorrhage. ICH Score: 3.    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  CT head right pontine hemorrhage with midbrain and medulla extension, IVH at fourth ventricle MRI  Acute brainstem hemorrhage centered at the pons. Extensive pontine edema, which tracks associated edema which tracks into the midbrain right >  left and into the right medullary pyramid. Mild extension of hemorrhage into  the 4th ventricle and basilar subarachnoid spaces. But no ventriculomegaly. No midline shift or impending herniation. Negative noncontrast MRI appearance of the orbits. CT repeat stable hematoma, no hydrocephalus 2D Echo EF 55 to 60%.  LDL 75 HgbA1c 5.4 VTE prophylaxis - Heparin subcu Not on Anticoagulant or antiplatelet prior to admission, now on no antithrombotics due to [place] Therapy recommendations:  SNF Disposition: Per Dr. Sloan Leiter, family leaning towards comfort care and waiting for sister coming to visit today and then proceed with comfort care vs. Hospice.  Cerebral Edema Neurosurgery consulted, no acute intervention recommended at this time. Off 3% saline allow sodium to trend down with daily check   Na 143->141->136->...->149  Acute respiratory failure Probable aspiration pneumonia Remains intubated, appreciate CCM management First on Unasyn->Rocephin->off Tmax 100.7->afebrile now Resp culture: few gram positive cocci PEG 10/18 with Trauma Trach 10/21 with ENT CCM on bard Still has copious secretions post trach    Hypertensive emergency Home meds: none Stable now BP goal less than 160  Currently on Norvasc 10, Coreg 25, hydralazine 100 Q8 Long-term BP goal normotensive  Hyperlipidemia Home meds:  None LDL  75, almost at goal < 70 May consider statin at discharge  AKI on CKD Ischemic ATN Anemia  Creatinine 3.65-3.80-5.2-6.27-5.69-5.46-5.44-5.60-5.53->5.90->5.93->6.68->7.12 BUN continues to elevate 200->205->219->233 Hb 8.2->7.6->7.7->7.3->7.1->6.6->PRBC->8.1->7.6->7.4->7.8->8.0 On tube feeding and free water  Urine adequate No acute need for dialysis Nephrology on board - not candidate for dialysis Per Dr. Sloan Leiter, family leaning towards comfort care and waiting for sister coming to visit today and then proceed with comfort care vs. Hospice.  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 Eulas Post Cipro eyedrop during the day and erythromycin ointment nightly Continue Vit C 2g and docxy 100 bid Appreciate ophthal Dr. Roda Shutters help  ETOH use disorder with dependence Daily use of excessive liquor per family (?1/2 bottle vodka)  Thiamine, folate and MVI on board Monitor magnesium, 2.2->2.2->2.1 Vitamin B12 -213 Monitor s/s of withdrawal, need for CIWA   Dysphagia Continue tube feeding and free water  Speech on board CT Chest Abd Pelvis: generalized gastric wall thickening, with associated fat stranding in the gastrohepatic ligament.Findings are nonspecific and could be due to noninflammatory edema,gastritis, peptic ulcer disease or neoplasm.  PEG done 10/18  Tobacco abuse Smoker PTA  Other Stroke Risk Factors ETOH use   Other Active Problems Multinodular goiter  Hospital day # 22  Neurology will sign off. Please call with questions. Appreciate Dr. Nena Alexander help.  Rosalin Hawking, MD PhD Stroke Neurology 01/28/2021 9:12 AM   To contact Stroke Continuity provider, please refer to [email or web address] After hours, contact General Neurology

## 2021-01-29 DIAGNOSIS — I613 Nontraumatic intracerebral hemorrhage in brain stem: Secondary | ICD-10-CM | POA: Diagnosis not present

## 2021-01-29 DIAGNOSIS — I161 Hypertensive emergency: Secondary | ICD-10-CM | POA: Diagnosis not present

## 2021-01-29 DIAGNOSIS — J9601 Acute respiratory failure with hypoxia: Secondary | ICD-10-CM | POA: Diagnosis not present

## 2021-01-29 DIAGNOSIS — N179 Acute kidney failure, unspecified: Secondary | ICD-10-CM | POA: Diagnosis not present

## 2021-01-29 NOTE — Progress Notes (Signed)
PROGRESS NOTE    Timothy Good  ZOX:[id number] DOB: 1965/06/26 DOA: 01/05/2021 PCP: Raiford Simmonds., PA-C    Brief Narrative:  55 year old gentleman with multiple medical issues who was found down at home and found by police on a welfare visit.  Has history of uncontrolled hypertension, CKD stage IV, heart failure, hyperlipidemia, alcohol use, medication nonadherence, iron-deficiency anemia.  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  10/7, admitted to ICU and intubated.  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 10/21, tracheostomy and liberation from the ventilator.  PEG tube insertion. 10/22, seen by palliative changed to DNR.  DNR with no escalation of care and no dialysis. 10/25, transferred to [facility] care.  Remains on very poor clinical status. 10/29, family agreed for comfort care and hospice.    Assessment & Plan:   Active Problems:   Pontine hemorrhage (HCC)   Malnutrition of moderate degree   Acute respiratory failure with hypoxia (HCC)   Hypertension   Endotracheally intubated   End of life care   Comfort care pathway initiated. All symptom control medications available, Will use Dilaudid IV for pain and discomfort. Will use Ativan IV for anxiety and distress. Anticholinergics for secretions. Continue Foley catheter.  Continue stool softener to avoid constipation. No escalation of care.  No labs drawn.  Discontinue tube feeding. Will transfer out of ICU to facilitate unrestricted visitation. RN can pronounce death if happens in the hospital. Will monitor for 24 to 48 hours, he will probably pass away in the hospital.  Will refer to inpatient hospice if prolonged end-of-life care needed.    DVT prophylaxis:   Comfort care   Code Status: Comfort care Family Communication: Multiple family members 10/29, conference call Disposition Plan: Status  is: Inpatient  Remains inpatient appropriate because: End-of-life care.      Consultants:  ENT Critical care Palliative care  Procedures:  Right lead tarsorrhaphy Tracheostomy PEG tube placement  Antimicrobials:  Ciprofloxacin eyedrops.    Subjective:  Patient seen and examined.  Remains comfortable.  No other overnight events.  No family at the bedside. He did not follow commands but was spontaneously moving right upper and lower extremity.  Objective: Vitals:   01/29/21 0400 01/29/21 0800 01/29/21 0831 01/29/21 1019  BP:  (!) 118/96    Pulse: (!) 101 91 (!) 118 94  Resp:  (!) 22 (!) 24   Temp: 98.1 F (36.7 C) 97.7 F (36.5 C)    TempSrc: Axillary Axillary    SpO2: 92% 93% (!) 89% 91%  Weight:      Height:        Intake/Output Summary (Last 24 hours) at 01/29/2021 1057 Last data filed at 01/29/2021 0500 Gross per 24 hour  Intake 370 ml  Output 1350 ml  Net -980 ml   Filed Weights   01/22/21 0147 01/23/21 0405 01/27/21 0400  Weight: 76 kg 75.4 kg 73.1 kg    Examination:  General: Frail and debilitated.  Chronically sick looking.  Not in any distress.  Some spontaneous movement of the right upper and lower extremities.  Medicated for comfort. Alert to stimulation and follows simple commands on the right side. Cardiovascular: S1-S2 normal.  Regular rate rhythm. Respiratory: Currently on trach collar.  Saturating adequate.  Looks comfortable. Gastrointestinal: Soft.  Nontender.  PEG tube in place. Foley catheter with clear urine. Ext: Generalized anasarca.     Data Reviewed: I  have personally reviewed following labs and imaging studies  CBC: Recent Labs  Lab 01/24/21 0317 01/25/21 0500 01/26/21 0413 01/27/21 0333 01/28/21 0404  WBC 11.5* 11.4* 11.3* 11.2* 12.7*  HGB 7.2* 7.4* 7.4* 7.8* 8.0*  HCT 23.0* 24.3* 24.2* 26.0* 26.3*  MCV 90.6 91.7 92.4 94.2 94.3  PLT 466* 433* 484* 497* 706*   Basic Metabolic Panel: Recent Labs  Lab  01/24/21 0317 01/25/21 0500 01/26/21 0413 01/27/21 0333 01/28/21 0404  NA 141 145 145 149* 149*  K 3.9 3.9 4.0 3.7 4.0  CL 99 102 104 108 109  CO2 26 26 23 24 22   GLUCOSE 104* 109* 108* 108* 118*  BUN 187* 200* 205* 219* 233*  CREATININE 6.22* 6.21* 5.93* 6.68* 7.12*  CALCIUM 10.0 10.3 10.3 10.2 10.5*  PHOS  --   --  8.6* 7.7* 7.4*   GFR: Estimated Creatinine Clearance: 12.4 mL/min (A) (by C-G formula based on SCr of 7.12 mg/dL (H)). Liver Function Tests: Recent Labs  Lab 01/26/21 0413 01/27/21 0333 01/28/21 0404  AST 36  --   --   ALT 99*  --   --   ALKPHOS 98  --   --   BILITOT 0.8  --   --   PROT 8.0  --   --   ALBUMIN 2.2*  2.2* 2.1* 2.3*   No results for input(s): LIPASE, AMYLASE in the last 168 hours. No results for input(s): AMMONIA in the last 168 hours. Coagulation Profile: No results for input(s): INR, PROTIME in the last 168 hours.  Cardiac Enzymes: No results for input(s): CKTOTAL, CKMB, CKMBINDEX, TROPONINI in the last 168 hours. BNP (last 3 results) No results for input(s): PROBNP in the last 8760 hours. HbA1C: No results for input(s): HGBA1C in the last 72 hours. CBG: Recent Labs  Lab 01/26/21 2333 01/27/21 1159 01/27/21 1819 01/27/21 2327 01/28/21 1152  GLUCAP 108* 118* 115* 111* 118*   Lipid Profile: No results for input(s): CHOL, HDL, LDLCALC, TRIG, CHOLHDL, LDLDIRECT in the last 72 hours. Thyroid Function Tests: No results for input(s): TSH, T4TOTAL, FREET4, T3FREE, THYROIDAB in the last 72 hours. Anemia Panel: No results for input(s): VITAMINB12, FOLATE, FERRITIN, TIBC, IRON, RETICCTPCT in the last 72 hours. Sepsis Labs: No results for input(s): PROCALCITON, LATICACIDVEN in the last 168 hours.  Recent Results (from the past 240 hour(s))  Surgical PCR screen     Status: None   Collection Time: 12/31/2020  4:54 AM   Specimen: Nasal Mucosa; Nasal Swab  Result Value Ref Range Status   MRSA, PCR NEGATIVE NEGATIVE Final   Staphylococcus  aureus NEGATIVE NEGATIVE Final    Comment: (NOTE) The Xpert SA Assay (FDA approved for NASAL specimens in patients 35 years of age and older), is one component of a comprehensive surveillance program. It is not intended to diagnose infection nor to guide or monitor treatment. Performed at [facility], [place] [address] [place], [place] [address]          Radiology Studies: No results found.      Scheduled Meds:  Chlorhexidine Gluconate Cloth  6 each Topical Daily   ciprofloxacin  2 drop Right Eye Q4H while awake   erythromycin   Right Eye Q6H   sodium chloride flush  3 mL Intravenous Q12H   Continuous Infusions:  sodium chloride Stopped (01/14/21 1454)   sodium chloride       LOS: 23 days    Time spent: 30 minutes    Barb Merino, MD  [facility] Pager [phone]

## 2021-01-29 NOTE — Progress Notes (Signed)
Pt resting comfortably, VS stable. Pt turned for comfort. No family at the bedside at this time.

## 2021-01-29 NOTE — Progress Notes (Signed)
This chaplain responded to MD consult for EOL spiritual care.  The Pt. Appears to be resting comfortably and no family are at the bedside.  The chaplain appreciates the RN-Nicole's update and initiation of F/U spiritual care if needed.  Chaplain Sallyanne Kuster [phone]

## 2021-01-30 DIAGNOSIS — I161 Hypertensive emergency: Secondary | ICD-10-CM | POA: Diagnosis not present

## 2021-01-30 DIAGNOSIS — J9601 Acute respiratory failure with hypoxia: Secondary | ICD-10-CM | POA: Diagnosis not present

## 2021-01-30 DIAGNOSIS — I613 Nontraumatic intracerebral hemorrhage in brain stem: Secondary | ICD-10-CM | POA: Diagnosis not present

## 2021-01-30 DIAGNOSIS — N179 Acute kidney failure, unspecified: Secondary | ICD-10-CM | POA: Diagnosis not present

## 2021-01-30 NOTE — Progress Notes (Signed)
Nutrition Brief Note ° °Chart reviewed. °Pt now transitioning to comfort care.  °No further nutrition interventions planned at this time.  °Please re-consult as needed. ° ° °Kate Chistopher Mangino, MS, RD, LDN °Inpatient Clinical Dietitian °Please see [facility] for contact information. ° ° °

## 2021-01-30 NOTE — TOC Progression Note (Signed)
Transition of Care [facility]) - Progression Note    Patient Details  Name: Timothy Good MRN: [id number] Date of Birth: 19-Jun-1965  Transition of [facility] CM/SW [place], LCSW Phone Number: 01/30/2021, 10:01 AM  Clinical Narrative:    CSW received consult for hospice placement from MD. CSW spoke with patient's son (primary Media planner) and daugthter [name]. They are requesting that patient remain at the hospital to pass away instead of having to move him. CSW will discuss options with MD. If patient does have to be moved, they likely would choose [facility].         Expected Discharge Plan and Services                                                 Social Determinants of Health (SDOH) Interventions    Readmission Risk Interventions No flowsheet data found.

## 2021-01-30 NOTE — Progress Notes (Signed)
PROGRESS NOTE    Timothy Good  FIE:[id number] DOB: 09-04-1965 DOA: 01/27/2021 PCP: Raiford Simmonds., PA-C    Brief Narrative:  55 year old gentleman with multiple medical issues who was found down at home and found by police on a welfare visit.  Has history of uncontrolled hypertension, CKD stage IV, heart failure, hyperlipidemia, alcohol use, medication nonadherence, iron-deficiency anemia.  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  10/7, admitted to ICU and intubated.  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 10/21, tracheostomy and liberation from the ventilator.  PEG tube insertion. 10/22, seen by palliative changed to DNR.  DNR with no escalation of care and no dialysis. 10/25, transferred to [facility] care.  Remains on very poor clinical status. 10/29, family agreed for comfort care and hospice.    Assessment & Plan:   Active Problems:   Pontine hemorrhage (HCC)   Malnutrition of moderate degree   Acute respiratory failure with hypoxia (HCC)   Hypertension   Endotracheally intubated   End of life care   Remains comfortable on multiple medications including Dilaudid and Ativan. All symptom control medications available, Anticholinergics for secretions. Continue Foley catheter.  Continue stool softener to avoid constipation. No escalation of care.  No labs drawn.  Tube feedings are discontinued. Will transfer out of ICU to facilitate unrestricted visitation. RN can pronounce death if happens in the hospital. Family requesting not to transfer to hospice today.  Will provide end-of-life care in the hospital. I will reengage palliative care team to help with end-of-life care.  DVT prophylaxis:   Comfort care   Code Status: Comfort care Family Communication: None. Disposition Plan: Status is: Inpatient  Remains inpatient appropriate because: End-of-life  care.   Consultants:  ENT Critical care Palliative care  Procedures:  Right lead tarsorrhaphy Tracheostomy PEG tube placement  Antimicrobials:  Ciprofloxacin eyedrops.    Subjective:  Patient seen and examined.  He was just medicated with some Ativan.  Sleepy and comfortable.  Nursing reported no overnight events.  Objective: Vitals:   01/30/21 0013 01/30/21 0325 01/30/21 0400 01/30/21 0800  BP:   (!) 124/95   Pulse: (!) 107 (!) 108 (!) 107 (!) 110  Resp: 20   (!) 28  Temp:      TempSrc:      SpO2: 95% 95% 95% 93%  Weight:      Height:        Intake/Output Summary (Last 24 hours) at 01/30/2021 1110 Last data filed at 01/30/2021 0600 Gross per 24 hour  Intake --  Output 1225 ml  Net -1225 ml   Filed Weights   01/22/21 0147 01/23/21 0405 01/27/21 0400  Weight: 76 kg 75.4 kg 73.1 kg    Examination:  General: Frail debilitated.  Sleepy and comfortable today.  On minimum oxygen through trach collar for comfort. Cardiovascular: S1-S2 normal.  Tachycardic.  Regular rate rhythm. Respiratory: Bilateral conducted airway sounds. SpO2: 93 % O2 Flow Rate (L/min): 5 L/min FiO2 (%): 28 %  Gastrointestinal: Soft nontender.  PEG tube present. Ext: Generalized anasarca.     Data Reviewed: I have personally reviewed following labs and imaging studies  CBC: Recent Labs  Lab 01/24/21 0317 01/25/21 0500 01/26/21 0413 01/27/21 0333 01/28/21 0404  WBC 11.5* 11.4* 11.3* 11.2* 12.7*  HGB 7.2* 7.4* 7.4* 7.8* 8.0*  HCT 23.0* 24.3* 24.2* 26.0* 26.3*  MCV 90.6 91.7 92.4 94.2 94.3  PLT 466*  433* 484* 497* 623*   Basic Metabolic Panel: Recent Labs  Lab 01/24/21 0317 01/25/21 0500 01/26/21 0413 01/27/21 0333 01/28/21 0404  NA 141 145 145 149* 149*  K 3.9 3.9 4.0 3.7 4.0  CL 99 102 104 108 109  CO2 26 26 23 24 22   GLUCOSE 104* 109* 108* 108* 118*  BUN 187* 200* 205* 219* 233*  CREATININE 6.22* 6.21* 5.93* 6.68* 7.12*  CALCIUM 10.0 10.3 10.3 10.2 10.5*  PHOS   --   --  8.6* 7.7* 7.4*   GFR: Estimated Creatinine Clearance: 12.4 mL/min (A) (by C-G formula based on SCr of 7.12 mg/dL (H)). Liver Function Tests: Recent Labs  Lab 01/26/21 0413 01/27/21 0333 01/28/21 0404  AST 36  --   --   ALT 99*  --   --   ALKPHOS 98  --   --   BILITOT 0.8  --   --   PROT 8.0  --   --   ALBUMIN 2.2*  2.2* 2.1* 2.3*   No results for input(s): LIPASE, AMYLASE in the last 168 hours. No results for input(s): AMMONIA in the last 168 hours. Coagulation Profile: No results for input(s): INR, PROTIME in the last 168 hours.  Cardiac Enzymes: No results for input(s): CKTOTAL, CKMB, CKMBINDEX, TROPONINI in the last 168 hours. BNP (last 3 results) No results for input(s): PROBNP in the last 8760 hours. HbA1C: No results for input(s): HGBA1C in the last 72 hours. CBG: Recent Labs  Lab 01/26/21 2333 01/27/21 1159 01/27/21 1819 01/27/21 2327 01/28/21 1152  GLUCAP 108* 118* 115* 111* 118*   Lipid Profile: No results for input(s): CHOL, HDL, LDLCALC, TRIG, CHOLHDL, LDLDIRECT in the last 72 hours. Thyroid Function Tests: No results for input(s): TSH, T4TOTAL, FREET4, T3FREE, THYROIDAB in the last 72 hours. Anemia Panel: No results for input(s): VITAMINB12, FOLATE, FERRITIN, TIBC, IRON, RETICCTPCT in the last 72 hours. Sepsis Labs: No results for input(s): PROCALCITON, LATICACIDVEN in the last 168 hours.  No results found for this or any previous visit (from the past 240 hour(s)).        Radiology Studies: No results found.      Scheduled Meds:  Chlorhexidine Gluconate Cloth  6 each Topical Daily   ciprofloxacin  2 drop Right Eye Q4H while awake   erythromycin   Right Eye Q6H   sodium chloride flush  3 mL Intravenous Q12H   Continuous Infusions:  sodium chloride Stopped (01/14/21 1454)   sodium chloride       LOS: 24 days    Time spent: 25 minutes    Barb Merino, MD [facility] Pager [phone]

## 2021-01-30 NOTE — Progress Notes (Signed)
Patient received from 4N, tranferred for comfort care. Patient resting quietly, Trach to T-collar at 28% O2. Lines intact. Foley anfd peg tube intact. Will continue to give comfort care

## 2021-01-31 DIAGNOSIS — I161 Hypertensive emergency: Secondary | ICD-10-CM | POA: Diagnosis not present

## 2021-01-31 DIAGNOSIS — I613 Nontraumatic intracerebral hemorrhage in brain stem: Secondary | ICD-10-CM | POA: Diagnosis not present

## 2021-01-31 DIAGNOSIS — J9601 Acute respiratory failure with hypoxia: Secondary | ICD-10-CM | POA: Diagnosis not present

## 2021-01-31 DIAGNOSIS — N179 Acute kidney failure, unspecified: Secondary | ICD-10-CM | POA: Diagnosis not present

## 2021-01-31 MED ORDER — ERYTHROMYCIN 5 MG/GM OP OINT
TOPICAL_OINTMENT | OPHTHALMIC | Status: DC
  Filled 2021-01-31: qty 3.5

## 2021-01-31 MED ORDER — CHLORHEXIDINE GLUCONATE 4 % EX LIQD
CUTANEOUS | Status: AC
  Filled 2021-01-31: qty 15

## 2021-01-31 NOTE — Progress Notes (Signed)
PROGRESS NOTE    Timothy Good  PXT:[id number] DOB: 1965/11/16 DOA: 01/16/2021 PCP: Raiford Simmonds., PA-C    Brief Narrative:  55 year old gentleman with multiple medical issues who was found down at home and found by police on a welfare visit.  Has history of uncontrolled hypertension, CKD stage IV, heart failure, hyperlipidemia, alcohol use, medication nonadherence, iron-deficiency anemia.  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  10/7, admitted to ICU and intubated.  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 10/21, tracheostomy and liberation from the ventilator.  PEG tube insertion. 10/22, seen by palliative changed to DNR.  DNR with no escalation of care and no dialysis. 10/25, transferred to [facility] care.  Remains on very poor clinical status. 10/29, family agreed for comfort care and hospice.  Family not comfortable transferring him to inpatient hospice.    Assessment & Plan:   Active Problems:   Pontine hemorrhage (HCC)   Malnutrition of moderate degree   Acute respiratory failure with hypoxia (HCC)   Hypertension   Endotracheally intubated   End of life care   Remains comfortable on multiple medications including Dilaudid and Ativan. All symptom control medications available, Anticholinergics for secretions. Continue Foley catheter.  Continue stool softener to avoid constipation. No escalation of care.  No labs drawn.  Tube feedings are discontinued. RN can pronounce death if happens in the hospital. Family requesting not to transfer to hospice. Will provide end-of-life care in the hospital. Palliative care reengaged to help with medications. Anticipate hospital death.  DVT prophylaxis:   Comfort care   Code Status: Comfort care Family Communication: Patient's daughter on the phone, her mother on the phone. Disposition Plan: Status is:  Inpatient  Remains inpatient appropriate because: End-of-life care.   Consultants:  ENT Critical care Palliative care  Procedures:  Right lead tarsorrhaphy Tracheostomy PEG tube placement  Antimicrobials:  Ciprofloxacin eyedrops.    Subjective:  Patient seen and examined.  Remains quiet and comfortable.  Today he did not respond to stimuli.  He did not respond to touch.  Looks comfortable.  Has remained more sleepy and less responsive since last 24 hours. 1 dose of Dilaudid today given in the morning.  He has not received any Dilaudid or Ativan since yesterday morning.  Remains very comfortable.  I called and discussed with patient's family, they would like for him to pass away in the hospital.   Objective: Vitals:   01/31/21 0011 01/31/21 0455 01/31/21 0504 01/31/21 0751  BP:  118/83    Pulse: (!) 113 (!) 117 (!) 117 (!) 122  Resp: (!) 24 16 18 18   Temp:  98.1 F (36.7 C)    TempSrc:  Oral    SpO2: 93% 97% 94% 94%  Weight:      Height:        Intake/Output Summary (Last 24 hours) at 01/31/2021 1424 Last data filed at 01/31/2021 1114 Gross per 24 hour  Intake 3 ml  Output 450 ml  Net -447 ml   Filed Weights   01/22/21 0147 01/23/21 0405 01/27/21 0400  Weight: 76 kg 75.4 kg 73.1 kg    Examination:  General: Looks comfortable.  He is on trach collar.  Not responding today.  Tired and lethargic.  Sick looking.  Frail and debilitated. Cardiovascular: S1-S2 normal.  Tachycardic. Respiratory: Looks comfortable on trach collar.  On minimum oxygen.  Not in any distress. Gastrointestinal: Soft.  Does not wince on palpation.  Has a PEG tube. Ext: Anasarca.     Data Reviewed: I have personally reviewed following labs and imaging studies  CBC: Recent Labs  Lab 01/25/21 0500 01/26/21 0413 01/27/21 0333 01/28/21 0404  WBC 11.4* 11.3* 11.2* 12.7*  HGB 7.4* 7.4* 7.8* 8.0*  HCT 24.3* 24.2* 26.0* 26.3*  MCV 91.7 92.4 94.2 94.3  PLT 433* 484* 497* 425*   Basic  Metabolic Panel: Recent Labs  Lab 01/25/21 0500 01/26/21 0413 01/27/21 0333 01/28/21 0404  NA 145 145 149* 149*  K 3.9 4.0 3.7 4.0  CL 102 104 108 109  CO2 26 23 24 22   GLUCOSE 109* 108* 108* 118*  BUN 200* 205* 219* 233*  CREATININE 6.21* 5.93* 6.68* 7.12*  CALCIUM 10.3 10.3 10.2 10.5*  PHOS  --  8.6* 7.7* 7.4*   GFR: Estimated Creatinine Clearance: 12.4 mL/min (A) (by C-G formula based on SCr of 7.12 mg/dL (H)). Liver Function Tests: Recent Labs  Lab 01/26/21 0413 01/27/21 0333 01/28/21 0404  AST 36  --   --   ALT 99*  --   --   ALKPHOS 98  --   --   BILITOT 0.8  --   --   PROT 8.0  --   --   ALBUMIN 2.2*  2.2* 2.1* 2.3*   No results for input(s): LIPASE, AMYLASE in the last 168 hours. No results for input(s): AMMONIA in the last 168 hours. Coagulation Profile: No results for input(s): INR, PROTIME in the last 168 hours.  Cardiac Enzymes: No results for input(s): CKTOTAL, CKMB, CKMBINDEX, TROPONINI in the last 168 hours. BNP (last 3 results) No results for input(s): PROBNP in the last 8760 hours. HbA1C: No results for input(s): HGBA1C in the last 72 hours. CBG: Recent Labs  Lab 01/26/21 2333 01/27/21 1159 01/27/21 1819 01/27/21 2327 01/28/21 1152  GLUCAP 108* 118* 115* 111* 118*   Lipid Profile: No results for input(s): CHOL, HDL, LDLCALC, TRIG, CHOLHDL, LDLDIRECT in the last 72 hours. Thyroid Function Tests: No results for input(s): TSH, T4TOTAL, FREET4, T3FREE, THYROIDAB in the last 72 hours. Anemia Panel: No results for input(s): VITAMINB12, FOLATE, FERRITIN, TIBC, IRON, RETICCTPCT in the last 72 hours. Sepsis Labs: No results for input(s): PROCALCITON, LATICACIDVEN in the last 168 hours.  No results found for this or any previous visit (from the past 240 hour(s)).        Radiology Studies: No results found.      Scheduled Meds:  Chlorhexidine Gluconate Cloth  6 each Topical Daily   ciprofloxacin  2 drop Right Eye Q4H while awake    erythromycin   Right Eye Q4H while awake   sodium chloride flush  3 mL Intravenous Q12H   Continuous Infusions:  sodium chloride Stopped (01/14/21 1454)   sodium chloride       LOS: 25 days    Time spent: 25 minutes    Barb Merino, MD [facility] Pager [phone]

## 2021-01-31 NOTE — Progress Notes (Addendum)
Subjective Resting in bed. Patient now on 6N and goals of treatment are comfort care/hospice.   EXAMINATION   VAsc unable due to limited responsiveness   Pupils:  OD: no view due to tarsorhaphy OS: Miotic, Equal, round, reactive, no APD      Anterior segment penlight Exam: (view limited OD due to tarsorhaphy) Ext/Lids: floppy eyelids OU, R palpebral fissure nearly closed centrally (approximately 15mm open when OS is open, 91mm open on manual distraction, and fully closed when patient closes OS), tarsorhaphy in place Conj/Sclera: OD 1+ chemosis and mild subconj heme mostly temporal, OS white and quiet  Cornea: OD central/inferior corneal haze with resolved epithelial defect (somewhat visible with medial distraction of floppy eyelids. 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 Ophthalmology [facility]

## 2021-01-31 DEATH — deceased

## 2021-03-02 NOTE — Progress Notes (Signed)
Daughter, Milana Obey was notified of patients passing. She stated she is on her way to see patient.

## 2021-03-02 NOTE — Death Summary Note (Signed)
Death Summary  Timothy Good [id number]:[id number] DOB: [birth year] DOA: 02/05/2021  PCP: Royce Macadamia D., PA-C   Admit date: 02/09/2021 Date of Death: 02-27-21  Final Diagnoses:  Active Problems:   Pontine hemorrhage (HCC)   Malnutrition of moderate degree   Acute respiratory failure with hypoxia (HCC)   Hypertension   Endotracheally intubated   End of life care     History of present illness: Hospital Course:  55 year old with multiple medical problems who was found down at home and found by police on a welfare visit.  He has a history of uncontrolled hypertension, CKD stage IV, heart failure, hyperlipidemia, alcohol use, medication nonadherence, iron deficiency anemia.  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  On 02-02-2023 he was admitted to the ICU and intubated.  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  10/21 tracheostomy was placed and he was liberated from ventilator.  PEG tube insertion during this admission as well.  On 10/22 he was seen by palliative care and his CODE STATUS was changed to DNR.  No escalation of care and no dialysis.  On 10/25 patient care was transferred to [facility].  Patient remaining very poor clinical status.  On 7/29 family agreed for comfort care and hospice.  Family was not comfortable with transferring him to inpatient hospice.  Subsequently Feb 28, 2023 patient died at 36:45 AM.  Intracranial hemorrhage, right acute pontine hemorrhage due to uncontrolled severe hypertension.  CT head on admission showed right pontine hemorrhage with midbrain and midline extension.  MRI of the brain with acute brainstem hemorrhage.  Complete left hemiplegia, poor prognosis, patient continued to deteriorate.  Transferred to comfort care  Acute hypoxic respiratory failure secondary to pontine hemorrhage.  Ventilator dependent and now status post tracheostomy on trach  collar.  He was liberated from the ventilator and placed on trach collar.  Patient transition to comfort care. Acute kidney injury on CKD stage IV: Decision was made to proceed with hemodialysis.  Patient continued to decline.  Transition to comfort care.  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  Tube feeding discontinued patient transition to comfort care.     Time: 11:45 am.  Signed:  Ameen Mostafa A Jacquetta Polhamus  [facility] 2021/02/27, 12:08 PM

## 2021-03-02 DEATH — deceased
